# Patient Record
Sex: Female | Born: 1965 | Race: Black or African American | Hispanic: No | Marital: Single | Smoking: Never smoker
Health system: Southern US, Community
[De-identification: ages and names within clinical notes are randomized; demographics above are authoritative.]

## PROBLEM LIST (undated history)

## (undated) DIAGNOSIS — I10 Essential (primary) hypertension: Secondary | ICD-10-CM

## (undated) DIAGNOSIS — R32 Unspecified urinary incontinence: Secondary | ICD-10-CM

## (undated) DIAGNOSIS — R011 Cardiac murmur, unspecified: Secondary | ICD-10-CM

## (undated) DIAGNOSIS — R3915 Urgency of urination: Secondary | ICD-10-CM

## (undated) DIAGNOSIS — M25551 Pain in right hip: Secondary | ICD-10-CM

## (undated) DIAGNOSIS — R51 Headache: Secondary | ICD-10-CM

## (undated) DIAGNOSIS — G8929 Other chronic pain: Secondary | ICD-10-CM

## (undated) DIAGNOSIS — T8859XA Other complications of anesthesia, initial encounter: Secondary | ICD-10-CM

## (undated) DIAGNOSIS — Z8489 Family history of other specified conditions: Secondary | ICD-10-CM

## (undated) DIAGNOSIS — D649 Anemia, unspecified: Secondary | ICD-10-CM

## (undated) DIAGNOSIS — M1990 Unspecified osteoarthritis, unspecified site: Secondary | ICD-10-CM

## (undated) DIAGNOSIS — R0600 Dyspnea, unspecified: Secondary | ICD-10-CM

## (undated) DIAGNOSIS — G5602 Carpal tunnel syndrome, left upper limb: Secondary | ICD-10-CM

## (undated) DIAGNOSIS — T4145XA Adverse effect of unspecified anesthetic, initial encounter: Secondary | ICD-10-CM

## (undated) DIAGNOSIS — M545 Low back pain, unspecified: Secondary | ICD-10-CM

## (undated) DIAGNOSIS — Z8709 Personal history of other diseases of the respiratory system: Secondary | ICD-10-CM

## (undated) DIAGNOSIS — N390 Urinary tract infection, site not specified: Secondary | ICD-10-CM

## (undated) DIAGNOSIS — N949 Unspecified condition associated with female genital organs and menstrual cycle: Secondary | ICD-10-CM

## (undated) HISTORY — PX: BACK SURGERY: SHX140

## (undated) HISTORY — DX: Unspecified condition associated with female genital organs and menstrual cycle: N94.9

---

## 1990-04-12 HISTORY — PX: TUBAL LIGATION: SHX77

## 1993-04-12 HISTORY — PX: REDUCTION MAMMAPLASTY: SUR839

## 1998-07-06 ENCOUNTER — Emergency Department (HOSPITAL_COMMUNITY): Admission: EM | Admit: 1998-07-06 | Discharge: 1998-07-06 | Payer: Self-pay | Admitting: Emergency Medicine

## 1999-07-29 ENCOUNTER — Encounter: Admission: RE | Admit: 1999-07-29 | Discharge: 1999-07-29 | Payer: Self-pay | Admitting: Hematology and Oncology

## 1999-08-10 ENCOUNTER — Encounter: Admission: RE | Admit: 1999-08-10 | Discharge: 1999-08-10 | Payer: Self-pay | Admitting: Internal Medicine

## 1999-08-24 ENCOUNTER — Emergency Department (HOSPITAL_COMMUNITY): Admission: EM | Admit: 1999-08-24 | Discharge: 1999-08-24 | Payer: Self-pay | Admitting: Emergency Medicine

## 1999-11-19 ENCOUNTER — Encounter: Admission: RE | Admit: 1999-11-19 | Discharge: 1999-11-19 | Payer: Self-pay | Admitting: Internal Medicine

## 2001-02-19 ENCOUNTER — Emergency Department (HOSPITAL_COMMUNITY): Admission: EM | Admit: 2001-02-19 | Discharge: 2001-02-19 | Payer: Self-pay

## 2001-08-04 ENCOUNTER — Encounter: Admission: RE | Admit: 2001-08-04 | Discharge: 2001-08-04 | Payer: Self-pay

## 2001-09-27 ENCOUNTER — Encounter: Admission: RE | Admit: 2001-09-27 | Discharge: 2001-09-27 | Payer: Self-pay | Admitting: Internal Medicine

## 2003-10-04 ENCOUNTER — Emergency Department (HOSPITAL_COMMUNITY): Admission: EM | Admit: 2003-10-04 | Discharge: 2003-10-04 | Payer: Self-pay | Admitting: *Deleted

## 2005-11-01 ENCOUNTER — Emergency Department (HOSPITAL_COMMUNITY): Admission: EM | Admit: 2005-11-01 | Discharge: 2005-11-01 | Payer: Self-pay | Admitting: Emergency Medicine

## 2006-08-15 ENCOUNTER — Emergency Department (HOSPITAL_COMMUNITY): Admission: EM | Admit: 2006-08-15 | Discharge: 2006-08-15 | Payer: Self-pay | Admitting: Emergency Medicine

## 2006-09-21 ENCOUNTER — Emergency Department (HOSPITAL_COMMUNITY): Admission: EM | Admit: 2006-09-21 | Discharge: 2006-09-21 | Payer: Self-pay | Admitting: Family Medicine

## 2006-12-20 ENCOUNTER — Emergency Department (HOSPITAL_COMMUNITY): Admission: EM | Admit: 2006-12-20 | Discharge: 2006-12-20 | Payer: Self-pay | Admitting: Family Medicine

## 2007-03-10 ENCOUNTER — Ambulatory Visit (HOSPITAL_COMMUNITY): Admission: RE | Admit: 2007-03-10 | Discharge: 2007-03-10 | Payer: Self-pay | Admitting: Obstetrics & Gynecology

## 2007-03-16 ENCOUNTER — Encounter: Admission: RE | Admit: 2007-03-16 | Discharge: 2007-03-16 | Payer: Self-pay | Admitting: Obstetrics & Gynecology

## 2007-06-01 ENCOUNTER — Emergency Department (HOSPITAL_COMMUNITY): Admission: EM | Admit: 2007-06-01 | Discharge: 2007-06-01 | Payer: Self-pay | Admitting: Emergency Medicine

## 2007-06-02 ENCOUNTER — Emergency Department (HOSPITAL_COMMUNITY): Admission: EM | Admit: 2007-06-02 | Discharge: 2007-06-02 | Payer: Self-pay | Admitting: Emergency Medicine

## 2007-11-14 ENCOUNTER — Encounter: Admission: RE | Admit: 2007-11-14 | Discharge: 2007-11-14 | Payer: Self-pay | Admitting: Obstetrics & Gynecology

## 2007-11-20 ENCOUNTER — Emergency Department (HOSPITAL_COMMUNITY): Admission: EM | Admit: 2007-11-20 | Discharge: 2007-11-20 | Payer: Self-pay | Admitting: Emergency Medicine

## 2008-03-12 ENCOUNTER — Encounter (INDEPENDENT_AMBULATORY_CARE_PROVIDER_SITE_OTHER): Payer: Self-pay | Admitting: Internal Medicine

## 2008-03-12 ENCOUNTER — Encounter: Admission: RE | Admit: 2008-03-12 | Discharge: 2008-03-12 | Payer: Self-pay | Admitting: Obstetrics & Gynecology

## 2008-07-11 ENCOUNTER — Emergency Department (HOSPITAL_COMMUNITY): Admission: EM | Admit: 2008-07-11 | Discharge: 2008-07-11 | Payer: Self-pay | Admitting: Emergency Medicine

## 2008-09-09 ENCOUNTER — Encounter (INDEPENDENT_AMBULATORY_CARE_PROVIDER_SITE_OTHER): Payer: Self-pay | Admitting: Internal Medicine

## 2008-09-12 ENCOUNTER — Encounter (INDEPENDENT_AMBULATORY_CARE_PROVIDER_SITE_OTHER): Payer: Self-pay | Admitting: Internal Medicine

## 2008-09-12 ENCOUNTER — Ambulatory Visit: Payer: Self-pay | Admitting: Internal Medicine

## 2008-09-12 DIAGNOSIS — M150 Primary generalized (osteo)arthritis: Secondary | ICD-10-CM | POA: Insufficient documentation

## 2008-09-12 DIAGNOSIS — J45909 Unspecified asthma, uncomplicated: Secondary | ICD-10-CM

## 2008-09-13 LAB — CONVERTED CEMR LAB
HDL: 48 mg/dL (ref >39)
LDL Cholesterol: 117 mg/dL — ABNORMAL HIGH (ref 0–99)
Triglycerides: 69 mg/dL (ref <150)
VLDL: 14 mg/dL (ref 0–40)

## 2008-10-21 ENCOUNTER — Ambulatory Visit: Payer: Self-pay | Admitting: Internal Medicine

## 2008-10-21 LAB — CONVERTED CEMR LAB
Ketones, urine, test strip: NEGATIVE
Nitrite: NEGATIVE
Protein, U semiquant: 30
pH: 5

## 2008-11-02 ENCOUNTER — Emergency Department (HOSPITAL_COMMUNITY): Admission: EM | Admit: 2008-11-02 | Discharge: 2008-11-02 | Payer: Self-pay | Admitting: Emergency Medicine

## 2008-11-04 ENCOUNTER — Ambulatory Visit: Payer: Self-pay | Admitting: Internal Medicine

## 2009-01-08 ENCOUNTER — Ambulatory Visit: Payer: Self-pay | Admitting: Obstetrics and Gynecology

## 2009-01-08 ENCOUNTER — Encounter: Payer: Self-pay | Admitting: Obstetrics and Gynecology

## 2009-01-08 LAB — CONVERTED CEMR LAB: Hepatitis B Surface Ag: NEGATIVE

## 2009-01-08 LAB — HM PAP SMEAR: HM Pap smear: NEGATIVE

## 2009-01-16 ENCOUNTER — Ambulatory Visit: Payer: Self-pay | Admitting: Internal Medicine

## 2009-01-17 LAB — CONVERTED CEMR LAB
ALT: 8 units/L (ref 0–35)
AST: 12 units/L (ref 0–37)
Albumin: 4 g/dL (ref 3.5–5.2)
Calcium: 9.2 mg/dL (ref 8.4–10.5)
Chloride: 106 meq/L (ref 96–112)
Eosinophils Relative: 2 % (ref 0–5)
HCT: 39.4 % (ref 36.0–46.0)
Lymphocytes Relative: 45 % (ref 12–46)
Lymphs Abs: 3 10*3/uL (ref 0.7–4.0)
MCHC: 30.5 g/dL (ref 30.0–36.0)
MCV: 85.1 fL (ref >=78.0)
Neutro Abs: 2.7 10*3/uL (ref 1.7–7.7)
Neutrophils Relative %: 40 % — ABNORMAL LOW (ref 43–77)
Potassium: 4.1 meq/L (ref 3.5–5.3)
RBC: 4.63 M/uL (ref 3.87–5.11)
Sodium: 141 meq/L (ref 135–145)

## 2009-01-29 ENCOUNTER — Telehealth (INDEPENDENT_AMBULATORY_CARE_PROVIDER_SITE_OTHER): Payer: Self-pay | Admitting: *Deleted

## 2009-01-30 ENCOUNTER — Telehealth: Payer: Self-pay | Admitting: *Deleted

## 2009-03-05 ENCOUNTER — Ambulatory Visit: Payer: Self-pay | Admitting: Internal Medicine

## 2009-03-05 DIAGNOSIS — M25519 Pain in unspecified shoulder: Secondary | ICD-10-CM

## 2009-03-05 LAB — CONVERTED CEMR LAB: LDL Cholesterol: 97 mg/dL (ref 0–99)

## 2009-03-10 ENCOUNTER — Ambulatory Visit: Payer: Self-pay | Admitting: Family Medicine

## 2009-03-13 ENCOUNTER — Ambulatory Visit (HOSPITAL_COMMUNITY): Admission: RE | Admit: 2009-03-13 | Discharge: 2009-03-13 | Payer: Self-pay | Admitting: Internal Medicine

## 2009-04-12 DIAGNOSIS — N949 Unspecified condition associated with female genital organs and menstrual cycle: Secondary | ICD-10-CM

## 2009-04-12 HISTORY — DX: Unspecified condition associated with female genital organs and menstrual cycle: N94.9

## 2009-04-12 HISTORY — PX: OVARIAN CYST REMOVAL: SHX89

## 2009-05-11 ENCOUNTER — Emergency Department (HOSPITAL_COMMUNITY): Admission: EM | Admit: 2009-05-11 | Discharge: 2009-05-12 | Payer: Self-pay | Admitting: Emergency Medicine

## 2009-05-15 ENCOUNTER — Emergency Department (HOSPITAL_COMMUNITY): Admission: EM | Admit: 2009-05-15 | Discharge: 2009-05-15 | Payer: Self-pay | Admitting: Emergency Medicine

## 2009-05-15 ENCOUNTER — Emergency Department (HOSPITAL_COMMUNITY): Admission: EM | Admit: 2009-05-15 | Discharge: 2009-05-15 | Payer: Self-pay | Admitting: Family Medicine

## 2009-06-04 ENCOUNTER — Ambulatory Visit: Payer: Self-pay | Admitting: Obstetrics and Gynecology

## 2009-06-12 ENCOUNTER — Telehealth: Payer: Self-pay | Admitting: Internal Medicine

## 2009-06-24 ENCOUNTER — Ambulatory Visit: Payer: Self-pay | Admitting: Internal Medicine

## 2009-06-24 LAB — CONVERTED CEMR LAB: Chlamydia, DNA Probe: NEGATIVE

## 2009-07-07 ENCOUNTER — Encounter: Payer: Self-pay | Admitting: Obstetrics & Gynecology

## 2009-07-07 ENCOUNTER — Inpatient Hospital Stay (HOSPITAL_COMMUNITY): Admission: RE | Admit: 2009-07-07 | Discharge: 2009-07-09 | Payer: Self-pay | Admitting: Obstetrics & Gynecology

## 2009-07-07 ENCOUNTER — Ambulatory Visit: Payer: Self-pay | Admitting: Obstetrics & Gynecology

## 2009-09-04 ENCOUNTER — Ambulatory Visit: Payer: Self-pay | Admitting: Obstetrics & Gynecology

## 2009-09-24 ENCOUNTER — Ambulatory Visit: Payer: Self-pay | Admitting: Internal Medicine

## 2009-10-14 ENCOUNTER — Ambulatory Visit: Payer: Self-pay | Admitting: Internal Medicine

## 2009-10-16 LAB — CONVERTED CEMR LAB
HCT: 37.1 % (ref 36.0–46.0)
MCHC: 31.5 g/dL (ref 30.0–36.0)
Platelets: 357 10*3/uL (ref 150–400)
RDW: 16.2 % — ABNORMAL HIGH (ref 11.5–15.5)

## 2009-10-17 ENCOUNTER — Ambulatory Visit: Payer: Self-pay | Admitting: Internal Medicine

## 2009-10-18 LAB — CONVERTED CEMR LAB: Free T4: 1 ng/dL (ref 0.80–1.80)

## 2009-10-29 ENCOUNTER — Telehealth: Payer: Self-pay | Admitting: Internal Medicine

## 2009-10-30 ENCOUNTER — Telehealth: Payer: Self-pay | Admitting: Internal Medicine

## 2009-11-05 ENCOUNTER — Ambulatory Visit: Payer: Self-pay | Admitting: Obstetrics and Gynecology

## 2009-11-06 ENCOUNTER — Encounter: Payer: Self-pay | Admitting: Internal Medicine

## 2010-02-19 ENCOUNTER — Ambulatory Visit: Payer: Self-pay | Admitting: Internal Medicine

## 2010-02-19 LAB — CONVERTED CEMR LAB
Bilirubin Urine: NEGATIVE
Hemoglobin, Urine: NEGATIVE
Ketones, ur: NEGATIVE mg/dL
Leukocytes, UA: NEGATIVE
Protein, ur: NEGATIVE mg/dL
Specific Gravity, Urine: >=1.03
Urine Glucose: NEGATIVE mg/dL
WBC Urine, dipstick: NEGATIVE
pH: 5

## 2010-05-12 NOTE — Progress Notes (Signed)
Summary: refill/ hla  Phone Note Refill Request Message from:  Patient on June 12, 2009 5:39 PM  Refills Requested: Medication #1:  CYCLOBENZAPRINE HCL 10 MG TABS Take one tab twice daily as needed for muscle pain.   Last Refilled: 12/10  Medication #2:  ETODOLAC 400 MG TABS Take 1 tablet by mouth twice a day..   Last Refilled: 12/10 last visit 11/10  Initial call taken by: Marin Roberts RN,  June 12, 2009 5:39 PM  Follow-up for Phone Call        Refill approve    Prescriptions: ETODOLAC 400 MG TABS (ETODOLAC) Take 1 tablet by mouth twice a day.  #60 x 0   Entered and Authorized by:   Bethel Born MD   Signed by:   Bethel Born MD on 06/25/2009   Method used:   Electronically to        [facility] (retail)       [address]       [place], [place]  [address]       Ph: [phone]       Fax: [phone]   RxID:   [id number] CYCLOBENZAPRINE HCL 10 MG TABS (CYCLOBENZAPRINE HCL) Take one tab twice daily as needed for muscle pain.  #60 x 0   Entered and Authorized by:   Bethel Born MD   Signed by:   Bethel Born MD on 06/25/2009   Method used:   Electronically to        [facility] (retail)       [address]       [place], [place]  [address]       Ph: [phone]       Fax: [phone]   RxID:   [id number]

## 2010-05-12 NOTE — Assessment & Plan Note (Signed)
Summary: pelvic/gg   Vital Signs:  Patient profile:   45 year old female Height:      62 inches Weight:      234.0 pounds BMI:     42.95 Temp:     98.0 degrees F oral Pulse rate:   87 / minute BP sitting:   140 / 90  (right arm)  Vitals Entered By: Filomena Jungling NT II (June 24, 2009 10:53 AM) CC: PATIENT EXPOSED  CARD  FROM HEALTH DEPARTMENT Is Patient Diabetic? No Pain Assessment Patient in pain? no      Nutritional Status BMI of > 30 = obese  Have you ever been in a relationship where you felt threatened, hurt or afraid?No   Does patient need assistance? Functional Status Self care Ambulation Normal   Primary Care Provider:  Bethel Born MD  CC:  PATIENT EXPOSED  CARD  FROM HEALTH DEPARTMENT.  History of Present Illness: Brandi Chase is a 45 yo F who presents for testing after one of her partners reported that he was recently diagnosed and treated for chlamydia. She has two partners currently and does not use protection (she had her tubes tied previously). She denies any abnormal discharge, itching, or burning sensation. She would also like to be checked for other STDs, including syphilis and HIV. She is scheduled to have an ovarian cyst removed next week.  Preventive Screening-Counseling & Management  Caffeine-Diet-Exercise     Does Patient Exercise: no  Current Medications (verified): 1)  Cyclobenzaprine Hcl 10 Mg Tabs (Cyclobenzaprine Hcl) .... Take One Tab Twice Daily As Needed For Muscle Pain. 2)  Ventolin Hfa 108 (90 Base) Mcg/act Aers (Albuterol Sulfate) .... Take 2 Puffs Every 4 Hours As Needed For Shorness of Breath. 3)  Etodolac 400 Mg Tabs (Etodolac) .... Take 1 Tablet By Mouth Twice A Day.  Allergies (verified): 1)  ! Primatene Mist (Epinephrine Base)  Past History:  Past Medical History: Last updated: 09/12/2008 Asthma Low back pain 2/2 DDD  Past Surgical History: Last updated: 09/12/2008 Breast reduction 1994 Tuboligation 1992  Family  History: Last updated: 09/12/2008 Mom - RA Dad - Asthma, HTN 8 siblings - healthy  Social History: Last updated: 09/12/2008 Currently lives at home with son and daughter. 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 No apparent alopecia or balding. Genitalia:  No external lesions. No abnormal discharge or erythema of the vaginal wall.  Neurologic:  alert & oriented X3.   Psych:  Cognition and judgment appear intact. Alert and cooperative with normal attention span and concentration. No apparent delusions, illusions, hallucinations   Impression & Recommendations:  Problem # 1:  SEXUALLY TRANSMITTED DISEASE, EXPOSURE TO (ICD-V01.6) Pelvic exam was normal. Sent for GC and Chlamydia and will test for syphilis and HIV as well with pt's permission. I will call the patient tomorrow with the results of her tests.  Orders: T- GC Chlamydia ([address]) T-Syphilis Test ([facility]) [phone]) T-HIV Antibody  ([facility]) ([id number])  Complete Medication List: 1)  Cyclobenzaprine Hcl 10 Mg Tabs (Cyclobenzaprine hcl) .... Take one tab twice daily as needed for muscle pain. 2)  Ventolin Hfa 108 (90 Base) Mcg/act Aers (Albuterol sulfate) .... Take 2 puffs every 4  hours as needed for shorness of breath. 3)  Etodolac 400 Mg Tabs (Etodolac) .... Take 1 tablet by mouth twice a day.  Patient Instructions: 1)  Please schedule an appointment as needed. 2)  I will call you tomorrow with the results of your tests. Process Orders Check Orders Results:     [facility]: ABN not required for this insurance Tests Sent for requisitioning  (June 24, 2009 11:35 AM):     06/24/2009: [facility] -- T- GC Chlamydia [[id number]] (signed)     06/24/2009: [facility] -- T-Syphilis Test ([facility]) [phone] (signed)     06/24/2009: [facility] -- T-HIV Antibody  ([facility]) [[id number]] (signed)

## 2010-05-12 NOTE — Assessment & Plan Note (Signed)
Summary: DERMATOLOGIST REF FOR ACNE ON FACE AND BACK/CFB   Vital Signs:  Patient profile:   45 year old female Height:      62 inches (157.48 cm) Weight:      233.9 pounds (106.32 kg) BMI:     42.94 Temp:     97.4 degrees F oral Pulse rate:   84 / minute BP sitting:   18 / 79  (right arm)  Vitals Entered By: Chinita Pester RN (September 24, 2009 2:59 PM) CC: Acne on back - Derma. referral. Aching joints. Is Patient Diabetic? No Pain Assessment Patient in pain? yes     Location: shoulders Intensity: 8 Type: aching Onset of pain  Constant Nutritional Status BMI of > 30 = obese  Have you ever been in a relationship where you felt threatened, hurt or afraid?No   Does patient need assistance? Functional Status Self care Ambulation Normal   Primary Care Provider:  Bethel Born MD  CC:  Acne on back - Derma. referral. Aching joints..  History of Present Illness:  45 y/o woman with PMH of asthma, ,last episode last seen in 3/11 for test for STD after exposure to partner with chlamydia comes for a routien follow up visit. She had a right salpingectomy performed on 3/11 for pelvic pain without any pre-op or post-op complications.  Patient complaints of acne today. She says she had it for a while now and has tried retinoic acid and a small pill which I assume was minocycline. REtinoic acid heled but cause her excessive skin irritation.   Depression History:      The patient denies a depressed mood most of the day and a diminished interest in her usual daily activities.         Preventive Screening-Counseling & Management  Alcohol-Tobacco     Alcohol drinks/day: 0     Smoking Status: never  Caffeine-Diet-Exercise     Does Patient Exercise: no  Current Medications (verified): 1)  Cyclobenzaprine Hcl 10 Mg Tabs (Cyclobenzaprine Hcl) .... Take One Tab Twice Daily As Needed For Muscle Pain. 2)  Ventolin Hfa 108 (90 Base) Mcg/act Aers (Albuterol Sulfate) .... Take 2 Puffs Every 4  Hours As Needed For Shorness of Breath. 3)  Etodolac 400 Mg Tabs (Etodolac) .... Take 1 Tablet By Mouth Twice A Day. 4)  Benzoyl Peroxide 5 % Gel (Benzoyl Peroxide) .... Two Times A Day On The Acne With The Moisturizing Cream 5)  Ammonium Lactate 12 % Lotn (Ammonium Lactate) .... Apply With The Acne Cream Twice Daily  Allergies (verified): 1)  ! Primatene Mist (Epinephrine Base)  Review of Systems  The patient denies anorexia, fever, weight loss, weight gain, vision loss, decreased hearing, hoarseness, chest pain, syncope, dyspnea on exertion, peripheral edema, prolonged cough, headaches, hemoptysis, abdominal pain, melena, hematochezia, severe indigestion/heartburn, hematuria, incontinence, genital sores, muscle weakness, suspicious skin lesions, transient blindness, difficulty walking, depression, unusual weight change, abnormal bleeding, enlarged lymph nodes, angioedema, breast masses, and testicular masses.    Physical Exam  General:  Well-developed,well-nourished,in no acute distress; alert,appropriate and cooperative throughout examination Head:  Normocephalic and atraumatic without obvious abnormalities. No apparent alopecia or balding. Nose:  some mild acne n nose and cheeks and back Mouth:  Bilateral moderate to severe tonsillar hypertrophy almost blocking the view of posterior pharyngeal wall. no sign of erythema or pus noted. Lungs:  normal respiratory effort, no intercostal retractions, no accessory muscle use, and normal breath sounds.   Heart:  normal rate and regular rhythm.  Abdomen:  soft, non-tender, normal bowel sounds, and no distention.     Impression & Recommendations:  Problem # 1:  ACNE VULGARIS (ICD-706.1) retinoic acid causes intolerence. Will try benzoyl peroxide with moisturizing cream. Will try topical ABx if this regimen does not work. Will consider DErmatology referral at next visit as well  Her updated medication list for this problem includes:    Benzoyl  Peroxide 5 % Gel (Benzoyl peroxide) .Marland Kitchen..Marland Kitchen Two times a day on the acne with the moisturizing cream  Problem # 2:  LOW BACK PAIN, CHRONIC (ICD-724.2) PT. PAin meds helping  Her updated medication list for this problem includes:    Cyclobenzaprine Hcl 10 Mg Tabs (Cyclobenzaprine hcl) .Marland Kitchen... Take one tab twice daily as needed for muscle pain.    Etodolac 400 Mg Tabs (Etodolac) .Marland Kitchen... Take 1 tablet by mouth twice a day.  Problem # 3:  ASTHMA (ICD-493.90) Stable   Her updated medication list for this problem includes:    Ventolin Hfa 108 (90 Base) Mcg/act Aers (Albuterol sulfate) .Marland Kitchen... Take 2 puffs every 4 hours as needed for shorness of breath.  Problem # 4:  Preventive Health Care (ICD-V70.0) PAtient had lap salpingectomy and ovarian cyst removal. Her Hb was 10 post op. Will like to recheck CBC but she is refusing today for time constraints. Will consider at next visit  Complete Medication List: 1)  Cyclobenzaprine Hcl 10 Mg Tabs (Cyclobenzaprine hcl) .... Take one tab twice daily as needed for muscle pain. 2)  Ventolin Hfa 108 (90 Base) Mcg/act Aers (Albuterol sulfate) .... Take 2 puffs every 4 hours as needed for shorness of breath. 3)  Etodolac 400 Mg Tabs (Etodolac) .... Take 1 tablet by mouth twice a day. 4)  Benzoyl Peroxide 5 % Gel (Benzoyl peroxide) .... Two times a day on the acne with the moisturizing cream 5)  Ammonium Lactate 12 % Lotn (Ammonium lactate) .... Apply with the acne cream twice daily  Other Orders: Physical Therapy Referral (PT)  Patient Instructions: 1)  Please schedule a follow-up appointment in 2 months. Prescriptions: BENZOYL PEROXIDE 5 % GEL (BENZOYL PEROXIDE) two times a day on the acne with the moisturizing cream  #1 x 3   Entered and Authorized by:   Bethel Born MD   Signed by:   Bethel Born MD on 09/24/2009   Method used:   Electronically to        [facility] (retail)       [address]       [place], [place]   [address]       Ph: [phone]       Fax: [phone]   RxID:   [id number] AMMONIUM LACTATE 12 % LOTN (AMMONIUM LACTATE) apply with the acne cream twice daily  #1 x 3   Entered and Authorized by:   Bethel Born MD   Signed by:   Bethel Born MD on 09/24/2009   Method used:   Electronically to        [facility] (retail)       [address]       [place], [place]  [address]       Ph: [phone]       Fax: [phone]   RxID:   [id number]   Prevention & Chronic Care Immunizations   Influenza vaccine: Not documented   Influenza vaccine deferral: Refused  (  01/16/2009)   Influenza vaccine due: 12/12/2010    Tetanus booster: 10/21/2008: Td   Tetanus booster due: 10/22/2018    Pneumococcal vaccine: Not documented   Pneumococcal vaccine deferral: Refused  (09/24/2009)  Other Screening   Pap smear: NEGATIVE FOR INTRAEPITHELIAL LESIONS OR MALIGNANCY.  (01/08/2009)   Pap smear action/deferral: Ordered  (10/21/2008)   Pap smear due: 01/08/2010    Mammogram: ASSESSMENT: Negative - BI-RADS 1^MM DIGITAL SCREENING  (03/13/2009)   Mammogram action/deferral: Refused  (09/24/2009)   Mammogram due: 03/13/2010  Reports requested:   Last Pap report requested.  Smoking status: never  (09/24/2009)  Lipids   Total Cholesterol: 162  (03/05/2009)   Lipid panel action/deferral: Refused   LDL: 97  (03/05/2009)   LDL Direct: Not documented   HDL: 49  (03/05/2009)   Triglycerides: 79  (03/05/2009)   Lipid panel due: 04/23/2009      Resource handout printed.   Nursing Instructions: Request report of last Pap

## 2010-05-12 NOTE — Progress Notes (Signed)
Summary: Results  Phone Note Call from Patient   Caller: Patient Call For: Bethel Born MD Summary of Call: Call from pt given results of labs.  Pt said that she is always tired.  Wants to know what she can get for this.  Angelina Ok RN  October 29, 2009 3:13 PM  Initial call taken by: Angelina Ok RN,  October 29, 2009 3:13 PM  Follow-up for Phone Call        We might try looking into B12 deficinecy, diabetes, depression and immunodeficiency based on history at next appointement. It would be impossbile to give a pill for weakness. Until then advise, increased fluid intake amd multivitamin pills over the counter.   Thanks Follow-up by: Bethel Born MD,  October 30, 2009 8:24 AM

## 2010-05-12 NOTE — Progress Notes (Signed)
Summary: Multivitamins  Phone Note Outgoing Call   Call placed by: Angelina Ok RN,  October 30, 2009 10:37 AM Call placed to: Patient Summary of Call: RTC to pt informed her that Dr. Scot Dock suggested that she increase her fluid intake and to take OTC Mutivitamins to see if this will incresae her energy level.  Plans to do a further work up  at her next visit if this does not improve.  Pt voiced an understanding of the plan. Angelina Ok RN  October 30, 2009 10:39 AM  Initial call taken by: Angelina Ok RN,  October 30, 2009 10:39 AM

## 2010-05-12 NOTE — Assessment & Plan Note (Signed)
Summary: STOMACH/SB.   Vital Signs:  Patient profile:   45 year old female Height:      62 inches (157.48 cm) Weight:      232.0 pounds (105.45 kg) BMI:     42.59 Temp:     97.1 degrees F (36.17 degrees C) oral Pulse rate:   80 / minute BP sitting:   131 / 82  (left arm) Cuff size:   large  Vitals Entered By: Cynda Familia Duncan Dull) (February 19, 2010 3:37 PM) CC: lower abd pain. ?UTI Is Patient Diabetic? No Pain Assessment Patient in pain? no      Nutritional Status BMI of > 30 = obese  Have you ever been in a relationship where you felt threatened, hurt or afraid?No   Does patient need assistance? Functional Status Self care Ambulation Normal   Primary Care Provider:  Bethel Born MD  CC:  lower abd pain. ?UTI.  History of Present Illness: Pt is a 45 y/o AAF with PMH of UTI, acne and asthma comes to the clinic for suprapubic pain for 5 days. It is dull, no radiation, 5/10, no N/V, fever, flank pain or urination abnormality such as dysuria or urgency. These symptoms are similar to her previous UTI. She has no ther c/o. Her acne is much better. No smoking, ETOH or drug abuse.   Preventive Screening-Counseling & Management  Alcohol-Tobacco     Alcohol drinks/day: 0     Smoking Status: never  Problems Prior to Update: 1)  Thyroid Stimulating Hormone, Abnormal  (ICD-246.9) 2)  Acne Vulgaris  (ICD-706.1) 3)  Shoulder Pain, Left  (ICD-719.41) 4)  Health Screening  (ICD-V70.0) 5)  Low Back Pain, Chronic  (ICD-724.2) 6)  Asthma  (ICD-493.90)  Medications Prior to Update: 1)  Cyclobenzaprine Hcl 10 Mg Tabs (Cyclobenzaprine Hcl) .... Take One Tab Twice Daily As Needed For Muscle Pain. 2)  Ventolin Hfa 108 (90 Base) Mcg/act Aers (Albuterol Sulfate) .... Take 2 Puffs Every 4 Hours As Needed For Shorness of Breath. 3)  Etodolac 400 Mg Tabs (Etodolac) .... Take 1 Tablet By Mouth Twice A Day. 4)  Benzoyl Peroxide 5 % Gel (Benzoyl Peroxide) .... Two Times A Day On The Acne  With The Moisturizing Cream 5)  Ammonium Lactate 12 % Lotn (Ammonium Lactate) .... Apply With The Acne Cream Twice Daily  Current Medications (verified): 1)  Cyclobenzaprine Hcl 10 Mg Tabs (Cyclobenzaprine Hcl) .... Take One Tab Twice Daily As Needed For Muscle Pain. 2)  Ventolin Hfa 108 (90 Base) Mcg/act Aers (Albuterol Sulfate) .... Take 2 Puffs Every 4 Hours As Needed For Shorness of Breath. 3)  Etodolac 400 Mg Tabs (Etodolac) .... Take 1 Tablet By Mouth Twice A Day. 4)  Benzoyl Peroxide 5 % Gel (Benzoyl Peroxide) .... Two Times A Day On The Acne With The Moisturizing Cream 5)  Ammonium Lactate 12 % Lotn (Ammonium Lactate) .... Apply With The Acne Cream Twice Daily 6)  Ciprofloxacin Hcl 500 Mg Tabs (Ciprofloxacin Hcl) .... Take 1 Tablet By Mouth Two Times A Day For 3 Days  Allergies: 1)  ! 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 Mom - RA Dad - Asthma, HTN 8 siblings - healthy  Social History: Reviewed history from 09/12/2008 and no changes required. 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  The patient denies anorexia, fever, chest pain, syncope, dyspnea on exertion, peripheral edema, hemoptysis, melena, hematochezia, hematuria, and incontinence.    Physical Exam  General:  alert, well-developed, well-nourished,  well-hydrated, and overweight-appearing.   Head:  normocephalic.   Nose:  no nasal discharge.   Mouth:  pharynx pink and moist.   Neck:  supple.   Lungs:  normal respiratory effort, normal breath sounds, no crackles, and no wheezes.   Heart:  normal rate, regular rhythm, no murmur, and no JVD.   Abdomen:  soft, normal bowel sounds, no distention, and no masses.  Suprapubic mild tenderness. No CVAT. Msk:  normal ROM, no joint tenderness, no joint swelling, no joint warmth, and no redness over joints.   Pulses:  2+ Extremities:  No edema.  Neurologic:  alert & oriented X3, cranial nerves II-XII intact, strength normal in all extremities, sensation intact to light touch, and gait normal.     Impression & Recommendations:  Problem # 1:  UTI (ICD-599.0) Assessment New Her symptom may be due to UTI. But other possible cause include colitis, urolithiasis or GYN causes. Will check UA and UCx and give her cipro for 3 days to see if any improvement. If not, may need abdominal US or X-Ray.  Orders: T-Culture, Urine ([id number]) T-Urinalysis ([id number]) T-Urinalysis Dipstick only ([id number])  Her updated medication list for this problem includes:    Ciprofloxacin Hcl 500 Mg Tabs (Ciprofloxacin hcl) .Marland Kitchen... Take 1 tablet by mouth two times a day for 3 days  Problem # 2:  ACNE VULGARIS (ICD-706.1) Assessment: Improved Her acne is much better and will continue current meds. Her updated medication list for this problem includes:    Benzoyl Peroxide 5 % Gel (Benzoyl peroxide) .Marland Kitchen..Marland Kitchen Two times a day on the acne with the moisturizing cream    Ciprofloxacin Hcl 500 Mg Tabs (Ciprofloxacin hcl) .Marland Kitchen... Take 1 tablet by mouth two times a day for 3 days  Problem # 3:  LOW BACK PAIN, CHRONIC (ICD-724.2) Assessment: Unchanged Stable and continue current home meds. Her updated medication list for this problem includes:    Cyclobenzaprine Hcl 10 Mg Tabs (Cyclobenzaprine hcl) .Marland Kitchen... Take one tab twice daily as  needed for muscle pain.    Etodolac 400 Mg Tabs (Etodolac) .Marland Kitchen... Take 1 tablet by mouth twice a day.  Complete Medication List: 1)  Cyclobenzaprine Hcl 10 Mg Tabs (Cyclobenzaprine hcl) .... Take one tab twice daily as needed for muscle pain. 2)  Ventolin Hfa 108 (90 Base) Mcg/act Aers (Albuterol sulfate) .... Take 2 puffs every 4 hours as needed for shorness of breath. 3)  Etodolac 400 Mg Tabs (Etodolac) .... Take 1 tablet by mouth twice a day. 4)  Benzoyl Peroxide 5 % Gel (Benzoyl peroxide) .... Two times a day on the acne with the moisturizing cream 5)  Ammonium Lactate 12 % Lotn (Ammonium lactate) .... Apply with the acne cream twice daily 6)  Ciprofloxacin Hcl 500 Mg Tabs (Ciprofloxacin hcl) .... Take 1 tablet by mouth two times a day for 3 days  Patient Instructions:  1)  Please schedule a follow-up appointment in 3-4 months. 2)  We will call you if any abnormal labs. 3)  Please drink more fluids and return if your symptoms has no improvement. 4)  It is important that you exercise regularly at least 20 minutes 5 times a week. If you develop chest pain, have severe difficulty breathing, or feel very tired , stop exercising immediately and seek medical attention. 5)  You need to lose weight. Consider a lower calorie diet and regular exercise.  Prescriptions: ETODOLAC 400 MG TABS (ETODOLAC) Take 1 tablet by mouth twice a day.  #60 x 0   Entered and Authorized by:   Jackson Latino MD   Signed by:   Jackson Latino MD on 02/19/2010   Method used:   Electronically to        [facility] (retail)       [address]       [place], [place]  [address]       Ph: [phone]       Fax: [phone]   RxID:   [id number] CIPROFLOXACIN HCL 500 MG TABS (CIPROFLOXACIN HCL) Take 1 tablet by mouth two times a day for 3 days  #6 x 0   Entered and Authorized by:   Jackson Latino MD   Signed by:   Jackson Latino MD on 02/19/2010   Method used:   Electronically to          [facility] (retail)       [address]       [place], [place]  [address]       Ph: [phone]       Fax: [phone]   RxID:   [id number]    Orders Added: 1)  T-Culture, Urine [[id number]] 2)  T-Urinalysis [[id number]] 3)  Est. Patient Level III [32355] 4)  T-Urinalysis Dipstick only [[id number]]   Process Orders Check Orders Results:     [facility]: ABN not required for this insurance Tests Sent for requisitioning (February 19, 2010 9:14 PM):     02/19/2010: [facility] -- T-Culture, Urine [[id number]] (signed)     02/19/2010: [facility] -- T-Urinalysis [[id number]] (signed)     Prevention & Chronic Care Immunizations   Influenza vaccine: Not documented   Influenza vaccine deferral: Refused  (01/16/2009)   Influenza vaccine due: 12/12/2010    Tetanus booster: 10/21/2008: Td   Tetanus booster due: 10/22/2018    Pneumococcal vaccine: Not documented   Pneumococcal vaccine deferral: Refused  (09/24/2009)  Other Screening   Pap smear: NEGATIVE FOR INTRAEPITHELIAL LESIONS OR MALIGNANCY.  (01/08/2009)   Pap smear action/deferral: Ordered  (10/21/2008)   Pap smear due: 01/08/2010    Mammogram: ASSESSMENT: Negative - BI-RADS 1^MM DIGITAL SCREENING  (03/13/2009)   Mammogram action/deferral: Refused  (09/24/2009)   Mammogram due: 03/13/2010   Smoking status: never  (02/19/2010)  Lipids   Total Cholesterol: 162  (03/05/2009)   Lipid panel action/deferral: Refused   LDL: 97  (03/05/2009)   LDL Direct: Not documented   HDL: 49  (03/05/2009)   Triglycerides: 79  (03/05/2009)   Lipid panel due: 04/23/2009   Laboratory Results   Urine Tests  Date/Time Received: .Cynda Familia [facility])  February 19, 2010 4:25 PM  Date/Time Reported: Cynda Familia [facility])  February 19, 2010 4:25 PM   Routine Urinalysis   Color: lt. yellow  Glucose: negative   (Normal Range:  Negative) Bilirubin: negative   (Normal Range: Negative) Ketone: negative   (Normal Range: Negative) Spec. Gravity: >=1.030   (Normal Range: 1.003-1.035) Blood: trace-lysed   (Normal Range: Negative) pH: 5.0   (Normal Range: 5.0-8.0) Protein: negative   (Normal Range: Negative) Urobilinogen: 0.2   (Normal Range: 0-1) Nitrite: negative   (Normal Range: Negative) Leukocyte Esterace: negative   (Normal Range: Negative)

## 2010-05-12 NOTE — Assessment & Plan Note (Signed)
Summary: CHECKUP/SB.   Vital Signs:  Patient profile:   45 year old female Height:      62 inches (157.48 cm) Weight:      234.03 pounds (106.38 kg) BMI:     42.96 Temp:     97.4 degrees F (36.33 degrees C) oral Pulse rate:   87 / minute BP sitting:   123 / 82  (right arm)  Vitals Entered By: Angelina Ok RN (October 14, 2009 3:39 PM) Is Patient Diabetic? No Pain Assessment Patient in pain? no      Nutritional Status BMI of > 30 = obese  Have you ever been in a relationship where you felt threatened, hurt or afraid?No   Does patient need assistance? Functional Status Self care Ambulation Normal Comments Check up hemoglobin.   Primary Care Provider:  Bethel Born MD   History of Present Illness: 45 y/o woman with PMH of acne, asthma comes to the clinic for a Hb check. I saw her 3 weeks ago for acne flare up.  No complaints The acne is a little better, although not all the way resolved compaint with meds   Depression History:      The patient denies a depressed mood most of the day and a diminished interest in her usual daily activities.         Preventive Screening-Counseling & Management  Alcohol-Tobacco     Alcohol drinks/day: 0     Smoking Status: never  Current Medications (verified): 1)  Cyclobenzaprine Hcl 10 Mg Tabs (Cyclobenzaprine Hcl) .... Take One Tab Twice Daily As Needed For Muscle Pain. 2)  Ventolin Hfa 108 (90 Base) Mcg/act Aers (Albuterol Sulfate) .... Take 2 Puffs Every 4 Hours As Needed For Shorness of Breath. 3)  Etodolac 400 Mg Tabs (Etodolac) .... Take 1 Tablet By Mouth Twice A Day. 4)  Benzoyl Peroxide 5 % Gel (Benzoyl Peroxide) .... Two Times A Day On The Acne With The Moisturizing Cream 5)  Ammonium Lactate 12 % Lotn (Ammonium Lactate) .... Apply With The Acne Cream Twice Daily  Allergies (verified): 1)  ! Primatene Mist (Epinephrine Base)  Review of Systems  The patient denies anorexia, fever, weight loss, weight gain, vision loss,  decreased hearing, hoarseness, chest pain, syncope, dyspnea on exertion, peripheral edema, prolonged cough, headaches, hemoptysis, abdominal pain, melena, hematochezia, severe indigestion/heartburn, hematuria, incontinence, genital sores, muscle weakness, suspicious skin lesions, transient blindness, difficulty walking, depression, unusual weight change, abnormal bleeding, enlarged lymph nodes, angioedema, breast masses, and testicular masses.    Physical Exam  General:  Well-developed,well-nourished,in no acute distress; alert,appropriate and cooperative throughout examination Head:  Normocephalic and atraumatic without obvious abnormalities. No apparent alopecia or balding. Ears:  External ear exam shows no significant lesions or deformities.  Nose:  some mild acne on rt cheek Neck:  No deformities, masses, or tenderness noted. Lungs:  normal respiratory effort, no intercostal retractions, no accessory muscle use, and normal breath sounds.   Heart:  normal rate and regular rhythm.   Abdomen:  soft, non-tender, normal bowel sounds, and no distention.   Extremities:  No clubbing, cyanosis, edema, or deformity noted with normal full range of motion of all joints.   Neurologic:  alert & oriented X3.     Impression & Recommendations:  Problem # 1:  ACNE VULGARIS (ICD-706.1) getting better but not reolved. Conitnue current meds. Abx cream and dermat referral if no improvement. will ask pt to call us if she feels she needs different treatment.  will check  TSH Her updated medication list for this problem includes:    Benzoyl Peroxide 5 % Gel (Benzoyl peroxide) .Marland Kitchen..Marland Kitchen Two times a day on the acne with the moisturizing cream  Orders: T-CBC No Diff ([id number]) T-TSH ([id number])  Problem # 2:  Preventive Health Care (ICD-V70.0) refuses flu shot  mammogram due 12/11.  PAP due 9/11 had BMET and HbA1c in 9/10 which was all normal. recheck oin 1-2 yrs  had ovarian cysts removeed in 3/11 and was  little anemic (Hb-10) post procedure. no vaginal bleeding postop. will check CBC today  Complete Medication List: 1)  Cyclobenzaprine Hcl 10 Mg Tabs (Cyclobenzaprine hcl) .... Take one tab twice daily as needed for muscle pain. 2)  Ventolin Hfa 108 (90 Base) Mcg/act Aers (Albuterol sulfate) .... Take 2 puffs every 4 hours as needed for shorness of breath. 3)  Etodolac 400 Mg Tabs (Etodolac) .... Take 1 tablet by mouth twice a day. 4)  Benzoyl Peroxide 5 % Gel (Benzoyl peroxide) .... Two times a day on the acne with the moisturizing cream 5)  Ammonium Lactate 12 % Lotn (Ammonium lactate) .... Apply with the acne cream twice daily  Patient Instructions: 1)  Please schedule a follow-up appointment in 6 months.  Prevention & Chronic Care Immunizations   Influenza vaccine: Not documented   Influenza vaccine deferral: Refused  (01/16/2009)   Influenza vaccine due: 12/12/2010    Tetanus booster: 10/21/2008: Td   Tetanus booster due: 10/22/2018    Pneumococcal vaccine: Not documented   Pneumococcal vaccine deferral: Refused  (09/24/2009)  Other Screening   Pap smear: NEGATIVE FOR INTRAEPITHELIAL LESIONS OR MALIGNANCY.  (01/08/2009)   Pap smear action/deferral: Ordered  (10/21/2008)   Pap smear due: 01/08/2010    Mammogram: ASSESSMENT: Negative - BI-RADS 1^MM DIGITAL SCREENING  (03/13/2009)   Mammogram action/deferral: Refused  (09/24/2009)   Mammogram due: 03/13/2010   Smoking status: never  (10/14/2009)  Lipids   Total Cholesterol: 162  (03/05/2009)   Lipid panel action/deferral: Refused   LDL: 97  (03/05/2009)   LDL Direct: Not documented   HDL: 49  (03/05/2009)   Triglycerides: 79  (03/05/2009)   Lipid panel due: 04/23/2009 w Process Orders Check Orders Results:     [facility]: ABN not required for this insurance Tests Sent for requisitioning (October 14, 2009 4:20 PM):     10/14/2009: [facility] -- T-CBC No Diff [[id number]] (signed)      10/14/2009: [facility] -- T-TSH [id number] (signed)     Vital Signs:  Patient profile:   45 year old female Height:      62 inches (157.48 cm) Weight:      234.03 pounds (106.38 kg) BMI:     42.96 Temp:     97.4 degrees F (36.33 degrees C) oral Pulse rate:   87 / minute BP sitting:   123 / 82  (right arm)  Vitals Entered By: Angelina Ok RN (October 14, 2009 3:39 PM)

## 2010-05-19 ENCOUNTER — Encounter: Payer: Self-pay | Admitting: Internal Medicine

## 2010-06-27 LAB — POCT URINALYSIS DIP (DEVICE)
Protein, ur: NEGATIVE mg/dL
Urobilinogen, UA: 0.2 mg/dL (ref 0.0–1.0)

## 2010-06-28 LAB — DIFFERENTIAL
Basophils Absolute: 0.4 10*3/uL — ABNORMAL HIGH (ref 0.0–0.1)
Basophils Relative: 3 % — ABNORMAL HIGH (ref 0–1)
Eosinophils Relative: 0 % (ref 0–5)
Lymphs Abs: 1.3 10*3/uL (ref 0.7–4.0)
Monocytes Relative: 4 % (ref 3–12)
Neutro Abs: 11.2 10*3/uL — ABNORMAL HIGH (ref 1.7–7.7)

## 2010-06-28 LAB — POCT CARDIAC MARKERS
CKMB, poc: <1 ng/mL — ABNORMAL LOW (ref 1.0–8.0)
Troponin i, poc: <0.05 ng/mL (ref 0.00–0.09)

## 2010-06-28 LAB — URINALYSIS, ROUTINE W REFLEX MICROSCOPIC
Bilirubin Urine: NEGATIVE
Glucose, UA: NEGATIVE mg/dL
Ketones, ur: NEGATIVE mg/dL
Leukocytes, UA: NEGATIVE
Nitrite: NEGATIVE
Protein, ur: NEGATIVE mg/dL

## 2010-06-28 LAB — CBC
HCT: 38.9 % (ref 36.0–46.0)
Hemoglobin: 13 g/dL (ref 12.0–15.0)
RBC: 4.6 MIL/uL (ref 3.87–5.11)
RDW: 15.7 % — ABNORMAL HIGH (ref 11.5–15.5)

## 2010-06-28 LAB — URINE MICROSCOPIC-ADD ON

## 2010-06-28 LAB — PREGNANCY, URINE: Preg Test, Ur: NEGATIVE

## 2010-07-01 LAB — DIFFERENTIAL
Lymphocytes Relative: 24 % (ref 12–46)
Lymphs Abs: 2.9 10*3/uL (ref 0.7–4.0)
Monocytes Relative: 4 % (ref 3–12)
Neutro Abs: 8.5 10*3/uL — ABNORMAL HIGH (ref 1.7–7.7)
Neutrophils Relative %: 71 % (ref 43–77)

## 2010-07-01 LAB — POCT I-STAT, CHEM 8
Chloride: 110 mEq/L (ref 96–112)
Creatinine, Ser: 0.8 mg/dL (ref 0.4–1.2)
Glucose, Bld: 125 mg/dL — ABNORMAL HIGH (ref 70–99)
Potassium: 3.4 mEq/L — ABNORMAL LOW (ref 3.5–5.1)
Sodium: 140 mEq/L (ref 135–145)

## 2010-07-01 LAB — CBC
Platelets: 310 10*3/uL (ref 150–400)
RBC: 4.55 MIL/uL (ref 3.87–5.11)
WBC: 12.1 10*3/uL — ABNORMAL HIGH (ref 4.0–10.5)

## 2010-07-06 LAB — CBC
HCT: 31 % — ABNORMAL LOW (ref 36.0–46.0)
HCT: 38.2 % (ref 36.0–46.0)
Hemoglobin: 10.2 g/dL — ABNORMAL LOW (ref 12.0–15.0)
Hemoglobin: 12.4 g/dL (ref 12.0–15.0)
MCHC: 32.3 g/dL (ref 30.0–36.0)
MCV: 85.2 fL (ref 78.0–100.0)
MCV: 85.5 fL (ref 78.0–100.0)
Platelets: 290 10*3/uL (ref 150–400)
RBC: 3.83 MIL/uL — ABNORMAL LOW (ref 3.87–5.11)
RBC: 4.56 MIL/uL (ref 3.87–5.11)
RDW: 16.5 % — ABNORMAL HIGH (ref 11.5–15.5)
WBC: 10.5 10*3/uL (ref 4.0–10.5)
WBC: 8.9 10*3/uL (ref 4.0–10.5)

## 2010-07-06 LAB — COMPREHENSIVE METABOLIC PANEL
ALT: 12 U/L (ref 0–35)
Alkaline Phosphatase: 57 U/L (ref 39–117)
BUN: 11 mg/dL (ref 6–23)
CO2: 23 mEq/L (ref 19–32)
Calcium: 9.2 mg/dL (ref 8.4–10.5)
GFR calc non Af Amer: >60 mL/min (ref >60)
Glucose, Bld: 96 mg/dL (ref 70–99)
Potassium: 4.9 mEq/L (ref 3.5–5.1)
Sodium: 136 mEq/L (ref 135–145)
Total Protein: 7.4 g/dL (ref 6.0–8.3)

## 2010-07-19 LAB — RAPID STREP SCREEN (MED CTR MEBANE ONLY): Streptococcus, Group A Screen (Direct): POSITIVE — AB

## 2010-07-22 LAB — COMPREHENSIVE METABOLIC PANEL
ALT: 12 U/L (ref 0–35)
AST: 15 U/L (ref 0–37)
Albumin: 3.4 g/dL — ABNORMAL LOW (ref 3.5–5.2)
Calcium: 8.7 mg/dL (ref 8.4–10.5)
Chloride: 110 mEq/L (ref 96–112)
Creatinine, Ser: 0.79 mg/dL (ref 0.4–1.2)
GFR calc Af Amer: >60 mL/min (ref >60)
Sodium: 140 mEq/L (ref 135–145)
Total Bilirubin: 0.3 mg/dL (ref 0.3–1.2)

## 2010-07-22 LAB — URINALYSIS, ROUTINE W REFLEX MICROSCOPIC
Bilirubin Urine: NEGATIVE
Ketones, ur: NEGATIVE mg/dL
Specific Gravity, Urine: 1.017 (ref 1.005–1.030)
Urobilinogen, UA: 0.2 mg/dL (ref 0.0–1.0)

## 2010-07-22 LAB — DIFFERENTIAL
Basophils Absolute: 0 10*3/uL (ref 0.0–0.1)
Basophils Relative: 0 % (ref 0–1)
Eosinophils Absolute: 0.1 10*3/uL (ref 0.0–0.7)
Eosinophils Relative: 0 % (ref 0–5)
Monocytes Absolute: 0.4 10*3/uL (ref 0.1–1.0)
Monocytes Relative: 3 % (ref 3–12)
Neutro Abs: 11.1 10*3/uL — ABNORMAL HIGH (ref 1.7–7.7)

## 2010-07-22 LAB — CBC
HCT: 41.7 % (ref 36.0–46.0)
Hemoglobin: 13.7 g/dL (ref 12.0–15.0)
MCHC: 32.9 g/dL (ref 30.0–36.0)
MCV: 85.2 fL (ref 78.0–100.0)
RBC: 4.9 MIL/uL (ref 3.87–5.11)
RDW: 16.5 % — ABNORMAL HIGH (ref 11.5–15.5)

## 2010-07-22 LAB — PREGNANCY, URINE: Preg Test, Ur: NEGATIVE

## 2010-07-22 LAB — URINE MICROSCOPIC-ADD ON

## 2010-08-24 ENCOUNTER — Encounter: Payer: Self-pay | Admitting: Internal Medicine

## 2010-08-24 ENCOUNTER — Ambulatory Visit (INDEPENDENT_AMBULATORY_CARE_PROVIDER_SITE_OTHER): Payer: Self-pay | Admitting: Internal Medicine

## 2010-08-24 ENCOUNTER — Telehealth: Payer: Self-pay | Admitting: *Deleted

## 2010-08-24 DIAGNOSIS — M545 Low back pain: Secondary | ICD-10-CM

## 2010-08-24 DIAGNOSIS — N390 Urinary tract infection, site not specified: Secondary | ICD-10-CM

## 2010-08-24 MED ORDER — ETODOLAC 400 MG PO TABS: 400.0000 mg | ORAL_TABLET | Freq: Two times a day (BID) | ORAL | Status: DC

## 2010-08-24 MED ORDER — CIPROFLOXACIN HCL 250 MG PO TABS: 250.0000 mg | ORAL_TABLET | Freq: Two times a day (BID) | ORAL | Status: AC

## 2010-08-24 MED ORDER — CYCLOBENZAPRINE HCL 10 MG PO TABS: 10.0000 mg | ORAL_TABLET | Freq: Two times a day (BID) | ORAL | Status: DC | PRN

## 2010-08-24 NOTE — Assessment & Plan Note (Signed)
[name] has a history of UTI in the past and is having increased frequency and suprapubic pain for 3 days. Her urine dipstick shows trace leukocytes and trace blood. Considering her symptoms and dipstick findings, will send his urine for culture and start her on ciprofloxacin 250 mg by mouth twice a day for next 3 days. Explain her to call the clinic if her symptoms will get better over the next 3-4 days.

## 2010-08-24 NOTE — Telephone Encounter (Signed)
Call from pt said that she is having abdominal pain.  Thinks she may have a UTI.  Pt said she is having some frequency.  Pt when asked said that she does have a temperature at times.  Tired.  Feels bad.  No odor to the urine per pt.  Given an appointment for this afternoon at 2:15 PM.

## 2010-08-24 NOTE — Assessment & Plan Note (Signed)
Has intermittent low back pain. Will refill Flexeril and etodolac today.

## 2010-08-24 NOTE — Progress Notes (Signed)
  Subjective:    Patient ID: Brandi Chase, female    DOB: 1966-03-30, 45 y.o.   MRN: [id number]  HPI Brandi Chase is a pleasant fortifier one with a past with a history of asthma, chronic low back pain who comes to the clinic which is complain of suprapubic pain and increased frequency of urination for last 3 days. She's having mild suprapubic pain about 3/10 and has to go to the urine more frequently than usual for last 3 days but denies any burning micturition or pain during urination. She had UTI a few times in the past and feels like she's having similar problem right now. Also denies any fever, chills, dizziness, headache. No chest pain, cough, short of breath, wheezing, diarrhea, constipation.    Review of Systems As per history of present illness    Objective:   Physical Exam    Constitutional: Vital signs reviewed.  Patient is obese, in no acute distress and cooperative with exam. Alert and oriented x3.  Head: Normocephalic and atraumatic Mouth: no erythema or exudates, MMM Eyes: PERRL, EOMI, conjunctivae normal, No scleral icterus.  Neck: Supple, Trachea midline normal ROM, No JVD, mass, thyromegaly, or carotid bruit present.  Cardiovascular: RRR, S1 normal, S2 normal, no MRG, pulses symmetric and intact bilaterally Pulmonary/Chest: CTAB, no wheezes, rales, or rhonchi Abdominal: Soft. Mildly tender to palpation of suprapubic region, non-distended, bowel sounds are normal, no masses, organomegaly, or guarding present.  GU: no CVA tenderness Musculoskeletal: No joint deformities, erythema, or stiffness, ROM full and no nontender Neurological: A&O x3, Strenght is normal and symmetric bilaterally, cranial nerve II-XII are grossly intact, no focal motor deficit, sensory intact to light touch bilaterally.  Skin: Warm, dry and intact. No rash, cyanosis, or clubbing.  Psychiatric: Normal mood and affect. speech and behavior is normal. Judgment and thought content normal. Cognition  and memory are normal.       Assessment & Plan:

## 2010-08-24 NOTE — Patient Instructions (Signed)
Please make a f/u appt in 3-4 months. Please take ciprofloxacin for 3 days total- 1 tab twice daily Also drink plenty of water.

## 2010-08-24 NOTE — Telephone Encounter (Signed)
Thank you :)

## 2010-08-25 ENCOUNTER — Encounter: Payer: Self-pay | Admitting: Internal Medicine

## 2010-08-27 LAB — URINE CULTURE: Colony Count: >=100000

## 2010-09-16 ENCOUNTER — Emergency Department (HOSPITAL_COMMUNITY)
Admission: EM | Admit: 2010-09-16 | Discharge: 2010-09-16 | Disposition: A | Discharge status: Home / Self Care | Payer: Self-pay | Attending: Emergency Medicine | Admitting: Emergency Medicine

## 2010-09-16 ENCOUNTER — Emergency Department (HOSPITAL_COMMUNITY): Payer: Self-pay

## 2010-09-16 DIAGNOSIS — R079 Chest pain, unspecified: Secondary | ICD-10-CM | POA: Insufficient documentation

## 2010-09-16 DIAGNOSIS — R072 Precordial pain: Secondary | ICD-10-CM | POA: Insufficient documentation

## 2010-09-16 LAB — TROPONIN I: Troponin I: <0.3 ng/mL (ref <0.30)

## 2010-09-16 LAB — CBC
MCHC: 32.1 g/dL (ref 30.0–36.0)
Platelets: 330 10*3/uL (ref 150–400)
RDW: 15.2 % (ref 11.5–15.5)
WBC: 13 10*3/uL — ABNORMAL HIGH (ref 4.0–10.5)

## 2010-09-16 LAB — BASIC METABOLIC PANEL
BUN: 11 mg/dL (ref 6–23)
Calcium: 9.1 mg/dL (ref 8.4–10.5)
GFR calc non Af Amer: >60 mL/min (ref >60)
Potassium: 4 mEq/L (ref 3.5–5.1)
Sodium: 137 mEq/L (ref 135–145)

## 2010-09-16 LAB — DIFFERENTIAL
Basophils Absolute: 0 10*3/uL (ref 0.0–0.1)
Eosinophils Absolute: 0.4 10*3/uL (ref 0.0–0.7)
Eosinophils Relative: 3 % (ref 0–5)

## 2010-09-16 LAB — CK TOTAL AND CKMB (NOT AT ARMC): Total CK: 113 U/L (ref 7–177)

## 2010-09-22 ENCOUNTER — Encounter: Payer: Self-pay | Admitting: Internal Medicine

## 2010-09-22 ENCOUNTER — Ambulatory Visit (INDEPENDENT_AMBULATORY_CARE_PROVIDER_SITE_OTHER): Payer: Self-pay | Admitting: Internal Medicine

## 2010-09-22 DIAGNOSIS — M545 Low back pain: Secondary | ICD-10-CM

## 2010-09-22 DIAGNOSIS — M25569 Pain in unspecified knee: Secondary | ICD-10-CM

## 2010-09-22 DIAGNOSIS — E079 Disorder of thyroid, unspecified: Secondary | ICD-10-CM

## 2010-09-22 DIAGNOSIS — J45909 Unspecified asthma, uncomplicated: Secondary | ICD-10-CM

## 2010-09-22 DIAGNOSIS — K219 Gastro-esophageal reflux disease without esophagitis: Secondary | ICD-10-CM

## 2010-09-22 DIAGNOSIS — M25562 Pain in left knee: Secondary | ICD-10-CM

## 2010-09-22 NOTE — Assessment & Plan Note (Signed)
Unclear etiology at this point. Unlikely septic arthritis, bursitis or ligament injury. This may be related to osteoarthritis. 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 She has chronic back pain since around 10 years. X-ray at that time revealed degenerative disease. I will refer the patient to sports medicine for further evaluation. Recommended to continue Lodine and Flexeril. Furthermore advised conservative measurements including decreasing weight and increasing exercise. Patient noted that she will have a personal trainer starting from tomorrow long period.

## 2010-09-22 NOTE — Assessment & Plan Note (Signed)
One year ago patient low TSH 0.318 but normal T4. Patient had recent ED visit for chest palpitation. Patient was ruled out for ACS. Chest x-ray did not show any acute process. I will recheck TSH today.

## 2010-09-22 NOTE — Assessment & Plan Note (Signed)
Patient noted that she is using her albuterol inhaler just very occasionally. On physical exam and wheezing breasts notable. Continue current regimen.

## 2010-09-22 NOTE — Progress Notes (Signed)
  Subjective:    Patient ID: Brandi Chase, female    DOB: Sep 16, 1965, 45 y.o.   MRN: [id number]  HPI This is a 45 year old female with possibly significant for chronic back pain since 10 month and asthma who presented to the clinic for left knee pain which has been present since six-month period Knee pain: Status 6 month ago when she was trying to stand up on the counter top. Since then she is experiencing a feeling of tightness in her left knee. She has troubles to walk up stairs because of this feeling. She denies any crepitus. She denies any trauma, fevers chills, significant swelling or redness, morning stiffness. Denies any significant lower extremities. She noted since today for knee discomfort has improved since using Flexeril and Lodine. He did not experience similar symptoms prior to December 2011. Constipation: She noted that she had some episodes of constipation. She has been using some stool softener over-the-counter since then it has improved. She does not recall the name. ED visit on 09/16/2010 for chest pain: ACS ruled out with negative cardiac enzymes. Chest x-ray did not show any acute process. Patient was discharged with a diagnosis of possibly anxiety used chest discomfort and was prescribed Ativan when necessary 3 times a day. Furthermore patient was prescribed omeprazole 20 for possible reflux.    Review of Systems  Constitutional: Negative for fever and chills.  Respiratory: Negative for choking, chest tightness and shortness of breath.   Cardiovascular: Negative for chest pain and palpitations.  Gastrointestinal: Positive for constipation. Negative for abdominal pain, diarrhea and abdominal distention.  Genitourinary: Negative for difficulty urinating.  Musculoskeletal: Positive for back pain. Negative for joint swelling and gait problem.  Neurological: Negative for weakness and numbness.       Objective:   Physical Exam  Constitutional: She appears well-developed  and well-nourished.  HENT:  Head: Normocephalic.  Neck: Neck supple.  Cardiovascular: Normal rate, regular rhythm and normal heart sounds.   Pulmonary/Chest: Effort normal and breath sounds normal. No respiratory distress. She has no wheezes. She has no rales. She exhibits no tenderness.  Abdominal: Soft. Bowel sounds are normal. She exhibits no distension. There is no tenderness. There is no rebound.  Musculoskeletal:       Left knee: She exhibits bony tenderness. She exhibits normal range of motion, no swelling, no effusion, no deformity, no erythema, normal alignment and normal patellar mobility. tenderness found.          Assessment & Plan:

## 2010-09-23 LAB — TSH: TSH: 0.51 u[IU]/mL (ref 0.350–4.500)

## 2010-09-28 ENCOUNTER — Encounter: Payer: Self-pay | Admitting: Family Medicine

## 2010-09-28 ENCOUNTER — Ambulatory Visit (INDEPENDENT_AMBULATORY_CARE_PROVIDER_SITE_OTHER): Payer: Self-pay | Admitting: Family Medicine

## 2010-09-28 VITALS — BP 124/80 | Ht 62.0 in | Wt 234.0 lb

## 2010-09-28 DIAGNOSIS — M6580 Other synovitis and tenosynovitis, unspecified site: Secondary | ICD-10-CM

## 2010-09-28 DIAGNOSIS — M76892 Other specified enthesopathies of left lower limb, excluding foot: Secondary | ICD-10-CM

## 2010-09-28 NOTE — Progress Notes (Signed)
  Subjective:    Patient ID: Brandi Chase, female    DOB: 01-Jan-1966, 45 y.o.   MRN: [id number]  HPI  Patient with left knee pain. She helped her daughter move into a new house and was going up and down several flights of stairs over the weekend. Next day she noticed pain at the top of her kneecap. He has gotten some better. She's had no giving way, no locking. She's not noticed any swelling. There's been no warmth to the knee. Pain is mostly with walking or changing position from seated to standing. Stairs are painful.  PERTINENT  PMH / PSH: No prior history of knee injury or knee surgery on the left. Review of Systems    denies any recent weight gain or weight loss that is unusual. Denies numbness or tingling in her left lower extremity. Objective:   Physical Exam    general: Well-developed overweight female no acute distress KNEE: Left knee mild tenderness to palpation at the insertion of the quadriceps tendon onto the patella. Negative patellar apprehension test. Ligamentously intact there is a valgus stress. Anterior drawer reveals good endpoint. McMurray's is negative.  ;  Muscle skeletal ultrasound: Poor quality secondary to habitus. I was able see the entire length of the patellar tendon and it is intact. The quadriceps tendon has small amount of fluid at the insertion onto the patella. There is no excess or increased vascular activity. I was unable to complete the rest of the knee exam secondary to body habitus    Assessment & Plan:  Quadriceps tendonitis acute: We'll have her do some short arc quad exercises. She was given a handout and is demonstrated in clinic she will ice after these. If she's not resolving in the next 1-2 weeks she'll let us know.

## 2010-10-20 ENCOUNTER — Encounter: Payer: Self-pay | Admitting: Internal Medicine

## 2010-11-09 ENCOUNTER — Encounter: Payer: Self-pay | Admitting: Internal Medicine

## 2010-11-09 ENCOUNTER — Ambulatory Visit (INDEPENDENT_AMBULATORY_CARE_PROVIDER_SITE_OTHER): Payer: Self-pay | Admitting: Internal Medicine

## 2010-11-09 DIAGNOSIS — J45909 Unspecified asthma, uncomplicated: Secondary | ICD-10-CM

## 2010-11-09 DIAGNOSIS — D649 Anemia, unspecified: Secondary | ICD-10-CM

## 2010-11-09 DIAGNOSIS — M545 Low back pain, unspecified: Secondary | ICD-10-CM

## 2010-11-09 DIAGNOSIS — N949 Unspecified condition associated with female genital organs and menstrual cycle: Secondary | ICD-10-CM

## 2010-11-09 DIAGNOSIS — N9489 Other specified conditions associated with female genital organs and menstrual cycle: Secondary | ICD-10-CM

## 2010-11-09 LAB — FERRITIN: Ferritin: 8 ng/mL — ABNORMAL LOW (ref 10–291)

## 2010-11-09 LAB — IRON AND TIBC
%SAT: 14 % — ABNORMAL LOW (ref 20–55)
Iron: 47 ug/dL (ref 42–145)
TIBC: 328 ug/dL (ref 250–470)
UIBC: 281 ug/dL

## 2010-11-09 MED ORDER — POLYETHYLENE GLYCOL 3350 17 G PO PACK: 17.0000 g | PACK | Freq: Every day | ORAL | Status: AC

## 2010-11-09 NOTE — Patient Instructions (Signed)
Follow up in 6 months 

## 2010-11-09 NOTE — Assessment & Plan Note (Signed)
Stable

## 2010-11-09 NOTE — Progress Notes (Signed)
A 45 year old woman with arthritis, asthma comes to the clinic for followup. Complains of dull aching pain in the right lower corner in and says that this pain feels similar to what she had when she [facility] Wants a referral to gynecology. No other complaints today.  Constitutional: Denies fever, chills, diaphoresis, appetite change and fatigue.  HEENT: Denies photophobia, eye pain, redness, hearing loss, ear pain, congestion, sore throat, rhinorrhea, sneezing, mouth sores, trouble swallowing, neck pain, neck stiffness and tinnitus.   Respiratory: Denies SOB, DOE, cough, chest tightness,  and wheezing.   Cardiovascular: Denies chest pain, palpitations and leg swelling.  Gastrointestinal: Denies nausea, vomiting, abdominal pain, diarrhea, constipation, blood in stool and abdominal distention.  Genitourinary: Denies dysuria, urgency, frequency, hematuria, flank pain and difficulty urinating.  Musculoskeletal: Denies myalgias, back pain, joint swelling, arthralgias and gait problem.  Skin: Denies pallor, rash and wound.  Neurological: Denies dizziness, seizures, syncope, weakness, light-headedness, numbness and headaches.  Hematological: Denies adenopathy. Easy bruising, personal or family bleeding history  Psychiatric/Behavioral: Denies suicidal ideation, mood changes, confusion, nervousness, sleep disturbance and agitation  BP 123/83  Pulse 84  Temp(Src) 97.6 F (36.4 C) (Oral)  Ht 5\' 2"  (1.575 m)  Wt 234 lb 9.6 oz (106.414 kg)  BMI 42.91 kg/m2  SpO2 98%  LMP 10/21/2010  General Appearance:    Alert, cooperative, no distress, appears stated age  Head:    Normocephalic, without obvious abnormality, atraumatic  Eyes:    PERRL, conjunctiva/corneas clear, EOM's intact, fundi    benign, both eyes  Ears:    Normal TM's and external ear canals, both ears  Nose:   Nares normal, septum midline, mucosa normal, no drainage    or  sinus tenderness  Throat:   Lips, mucosa, and tongue normal; teeth and gums normal  Neck:   Supple, symmetrical, trachea midline, no adenopathy;    thyroid:  no enlargement/tenderness/nodules; no carotid   bruit or JVD  Back:     Symmetric, no curvature, ROM normal, no CVA tenderness  Lungs:     Clear to auscultation bilaterally, respirations unlabored  Chest Wall:    No tenderness or deformity   Heart:    Regular rate and rhythm, S1 and S2 normal, no murmur, rub   or gallop  Breast Exam:    No tenderness, masses, or nipple abnormality  Abdomen:     Soft, non-tender, bowel sounds active all four quadrants,    no masses, no organomegaly  Extremities:   Extremities normal, atraumatic, no cyanosis or edema  Pulses:   2+ and symmetric all extremities  Skin:   Skin color, texture, turgor normal, no rashes or lesions  Lymph nodes:   Cervical, supraclavicular, and axillary nodes normal  Neurologic:   CNII-XII intact, normal strength, sensation and reflexes    throughout

## 2010-11-09 NOTE — Assessment & Plan Note (Signed)
Continue current medications. Pain well-controlled.

## 2010-11-10 ENCOUNTER — Encounter: Payer: Self-pay | Admitting: Internal Medicine

## 2010-11-12 ENCOUNTER — Encounter: Payer: Self-pay | Admitting: Internal Medicine

## 2010-11-12 ENCOUNTER — Other Ambulatory Visit: Payer: Self-pay | Admitting: Internal Medicine

## 2010-11-12 DIAGNOSIS — D509 Iron deficiency anemia, unspecified: Secondary | ICD-10-CM

## 2010-11-12 MED ORDER — FERROUS SULFATE 325 (65 FE) MG PO TABS: 325.0000 mg | ORAL_TABLET | Freq: Every day | ORAL | Status: DC

## 2010-11-12 NOTE — Progress Notes (Signed)
Error

## 2010-11-26 ENCOUNTER — Ambulatory Visit (INDEPENDENT_AMBULATORY_CARE_PROVIDER_SITE_OTHER): Payer: Self-pay | Admitting: Internal Medicine

## 2010-11-26 ENCOUNTER — Encounter: Payer: Self-pay | Admitting: Internal Medicine

## 2010-11-26 VITALS — BP 125/86 | HR 81 | Temp 97.7°F | Ht 62.0 in | Wt 235.6 lb

## 2010-11-26 DIAGNOSIS — H9209 Otalgia, unspecified ear: Secondary | ICD-10-CM

## 2010-11-26 NOTE — Progress Notes (Signed)
  Subjective:    Patient ID: Brandi Chase, female    DOB: 1966-01-08, 45 y.o.   MRN: [id number]  HPI 45 yo F with 5-6 days of bilateral ear burning and pulling feeling. Her right ear hurts slightly worse than her left. In addition, she has a headache over her right eye for 2-3 days. She had a subjective fever yesterday that responded to Tylenol. She denies cough, rhinorrhea, eye redness, nausea or vomiting, or changes in bowel or bladder habits.  She has no known history of sinus infections. She has not been swimming recently. She does have a history of left earwax accumulation, that was treated 8 or 9 years ago with frequent removal. She says that this pain feels different from that. She has no history of childhood ear problems.   Review of Systems Significant for intermittent abdominal pain ever since her cyst removal last year.    Objective:   Physical Exam  Constitutional: She appears well-developed and well-nourished. No distress.  HENT:  Head: Normocephalic and atraumatic.  Right Ear: Tympanic membrane and external ear normal. Tympanic membrane is not erythematous and not bulging.  Left Ear: Tympanic membrane and external ear normal. Tympanic membrane is not erythematous and not bulging.  Nose: Nose normal.  Mouth/Throat: Oropharynx is clear and moist and mucous membranes are normal. No oropharyngeal exudate.       She has moderate amount of wax bilaterally. However the right TM can't be visualized and is normal. The left TM cannot be visualized (but hurts less than the right)  Eyes: Conjunctivae are normal. Right eye exhibits no discharge. Left eye exhibits no discharge.  Neck: Neck supple.  Lymphadenopathy:    She has no cervical adenopathy.          Assessment & Plan:

## 2010-11-26 NOTE — Assessment & Plan Note (Signed)
5-6 days of bilateral ear pain without significant rhinorrhea or cough. TMs look normal, and patient does have mild right frontal sinus tenderness. She had one subjective fever yesterday but is afebrile today on exam. Given that she is relatively well-appearing, has normal TMs (but moderate wax bilaterally) and has minimal symptoms, she likely has a mild viral infection versus an allergic response. We discussed the pros and cons of a nasal steroid with this patient and we decided to proceed with conservative management. She is in fact feeling better today when compared with yesterday. We expect that this will resolve over the next few days. --Nasal saline spray --Nasal decongestants over-the-counter --Followup if no improvement

## 2010-11-30 ENCOUNTER — Telehealth: Payer: Self-pay | Admitting: *Deleted

## 2010-11-30 ENCOUNTER — Encounter: Payer: Self-pay | Admitting: Internal Medicine

## 2010-11-30 ENCOUNTER — Ambulatory Visit (INDEPENDENT_AMBULATORY_CARE_PROVIDER_SITE_OTHER): Payer: Self-pay | Admitting: Internal Medicine

## 2010-11-30 VITALS — BP 150/92 | HR 92 | Temp 97.5°F | Wt 238.6 lb

## 2010-11-30 DIAGNOSIS — N390 Urinary tract infection, site not specified: Secondary | ICD-10-CM | POA: Insufficient documentation

## 2010-11-30 LAB — URINALYSIS, ROUTINE W REFLEX MICROSCOPIC
Glucose, UA: NEGATIVE mg/dL
Hgb urine dipstick: NEGATIVE
Leukocytes, UA: NEGATIVE
Protein, ur: NEGATIVE mg/dL
Urobilinogen, UA: 0.2 mg/dL (ref 0.0–1.0)

## 2010-11-30 NOTE — Assessment & Plan Note (Signed)
Patient's symptoms are consistent with urinary tract infection. We will treat with a course of antibiotic Bactrim double strength twice daily. Patient was advised if her symptoms do not improve or get worse she needs to call us back for further evaluation. We will obtain urinalysis and urine culture.

## 2010-11-30 NOTE — Telephone Encounter (Signed)
Walk-in after call, thinks she has bladder infection

## 2010-11-30 NOTE — Progress Notes (Signed)
  Subjective:    Patient ID: Brandi Chase, female    DOB: 1966/01/04, 45 y.o.   MRN: [id number]  HPI  Patient 45 year old female with past medical history who presents to clinic with main concern of urinary frequency and urgency this started approximately 3 days ago associated with subjective fevers and chills. In addition patient reports urinary dribbling and sensation of incomplete voiding. She denies hematuria. She also denies nausea vomiting, systemic symptoms of weight loss or night sweats. Her last episode of urinary tract infection was August 2011. She denies recent sicknesses or hospitalizations, no episodes of chest pain or shortness of breath, no abdominal or concerns.  Review of Systems Per HPI    Objective:   Physical Exam  Constitutional: Vital signs reviewed.  Patient is a well-developed and well-nourished  in no acute distress and cooperative with exam. Alert and oriented x3.  Cardiovascular: RRR, S1 normal, S2 normal, no MRG, pulses symmetric and intact bilaterally Pulmonary/Chest: CTAB, no wheezes, rales, or rhonchi Abdominal: Soft. Non-tender, non-distended, bowel sounds are normal, no masses, organomegaly, or guarding present.  GU: no CVA tenderness        Assessment & Plan:

## 2010-12-01 LAB — URINALYSIS, MICROSCOPIC ONLY

## 2010-12-01 NOTE — Telephone Encounter (Signed)
Patient seen in clinic today

## 2010-12-11 ENCOUNTER — Ambulatory Visit (INDEPENDENT_AMBULATORY_CARE_PROVIDER_SITE_OTHER): Payer: Self-pay | Admitting: Internal Medicine

## 2010-12-11 ENCOUNTER — Encounter: Payer: Self-pay | Admitting: Internal Medicine

## 2010-12-11 VITALS — BP 125/84 | HR 85 | Temp 97.4°F | Ht 62.0 in | Wt 234.0 lb

## 2010-12-11 DIAGNOSIS — J309 Allergic rhinitis, unspecified: Secondary | ICD-10-CM | POA: Insufficient documentation

## 2010-12-11 DIAGNOSIS — J45909 Unspecified asthma, uncomplicated: Secondary | ICD-10-CM

## 2010-12-11 DIAGNOSIS — J029 Acute pharyngitis, unspecified: Secondary | ICD-10-CM | POA: Insufficient documentation

## 2010-12-11 LAB — RAPID STREP SCREEN (MED CTR MEBANE ONLY)

## 2010-12-11 MED ORDER — CETIRIZINE HCL 10 MG PO TABS: 10.0000 mg | ORAL_TABLET | Freq: Every day | ORAL | Status: DC

## 2010-12-11 MED ORDER — ALBUTEROL SULFATE HFA 108 (90 BASE) MCG/ACT IN AERS: 2.0000 | INHALATION_SPRAY | RESPIRATORY_TRACT | Status: DC | PRN

## 2010-12-11 NOTE — Progress Notes (Signed)
Subjective:   Patient ID: Brandi Chase female   DOB: [birth year] 45 y.o.   MRN: [id number]  HPI: [name] is a 45 y.o. woman who presents to clinic today complaining of a cough with production of yellow to brown sputum.  She states that the cough started on Sunday the 26th but has been getting better over the last 2 days with less production.  She denies any fever, chills, nausea, vomiting, SOB, post nasal drip, or sick contacts.  She has some mild pain with swallowing on the left side of her throat.  She has been using sore throat spray which helps the pain.  She has not taken anything for the cough.  She has a past medical history of asthma and doesn't know if these symptoms follow a seasonal pattern.    Past Medical History  Diagnosis Date  . Lower back pain   . Asthma   . Adnexal cyst 2011    removed   Current Outpatient Prescriptions  Medication Sig Dispense Refill  . albuterol (VENTOLIN HFA) 108 (90 BASE) MCG/ACT inhaler Inhale 2 puffs into the lungs every 4 (four) hours as needed. For shortness of breath.       . cyclobenzaprine (FLEXERIL) 10 MG tablet Take 1 tablet (10 mg total) by mouth 2 (two) times daily as needed. For muscle pain.  30 tablet  0  . etodolac (LODINE) 400 MG tablet Take 1 tablet (400 mg total) by mouth 2 (two) times daily.  30 tablet  0  . ferrous sulfate 325 (65 FE) MG tablet Take 1 tablet (325 mg total) by mouth daily.  30 tablet  3  . LORazepam (ATIVAN) 1 MG tablet Take 1 mg by mouth every 8 (eight) hours as needed. Prescribed by ED when evaluated for chest pain on 09/16/2010       . omeprazole (PRILOSEC) 20 MG capsule Take 20 mg by mouth daily. Prescribed by the ED after being evaluated for chest pain on 09/16/2010        No family history on file. History   Social History  . Marital Status: Single    Spouse Name: N/A    Number of Children: N/A  . Years of Education: N/A   Social History Main Topics  . Smoking status: Never Smoker     . Smokeless tobacco: None  . Alcohol Use: No  . Drug Use: No  . Sexually Active: None   Other Topics Concern  . None   Social History Narrative  . None   Review of Systems: Constitutional: Denies fever, chills, diaphoresis, appetite change and fatigue.  HEENT: Denies photophobia, eye pain, redness, hearing loss, ear pain, congestion, sore throat, rhinorrhea, sneezing, mouth sores, trouble swallowing, neck pain, neck stiffness and tinnitus.   Respiratory: Denies SOB, DOE, cough, chest tightness,  and wheezing.   Cardiovascular: Denies chest pain, palpitations and leg swelling.  Gastrointestinal: Denies nausea, vomiting, abdominal pain, diarrhea, constipation, blood in stool and abdominal distention.  Genitourinary: Denies dysuria, urgency, frequency, hematuria, flank pain and difficulty urinating.  Musculoskeletal: Denies myalgias, back pain, joint swelling, arthralgias and gait problem.  Skin: Denies pallor, rash and wound.  Neurological: Denies dizziness, seizures, syncope, weakness, light-headedness, numbness and headaches.  Hematological: Denies adenopathy. Easy bruising, personal or family bleeding history  Psychiatric/Behavioral: Denies suicidal ideation, mood changes, confusion, nervousness, sleep disturbance and agitation  Objective:  Physical Exam: Filed Vitals:   12/11/10 1541  BP: 125/84  Pulse: 85  Temp: 97.4 F (36.3 C)  TempSrc: Oral  Height: 5\' 2"  (1.575 m)  Weight: 234 lb (106.142 kg)   Constitutional: Vital signs reviewed.  Patient is a well-developed and well-nourished woman in no acute distress and cooperative with exam. Alert and oriented x3.  Head: Normocephalic and atraumatic Ear: TM normal bilaterally Mouth: no erythema in the posterior pharynx.  There is some mild clear to whitish material covering the large tonsils, no cobble stoning or drainage noted. .  MMM.  Nose: there is some mild redness over the turbinates with mild polyp formation.  Eyes:  PERRL, EOMI, conjunctivae normal, No scleral icterus.  Neck: Supple, Trachea midline normal ROM, No JVD, mass, thyromegaly, or carotid bruit present.  Cardiovascular: RRR, S1 normal, S2 normal, no MRG, pulses symmetric and intact bilaterally Pulmonary/Chest: CTAB, no wheezes, rales, or rhonchi Abdominal: Soft. Non-tender, non-distended, bowel sounds are normal, no masses, organomegaly, or guarding present.  GU: no CVA tenderness Musculoskeletal: No joint deformities, erythema, or stiffness, ROM full and no nontender Hematology: no cervical, inginal, or axillary adenopathy.  Neurological: A&O x3, Strength is normal and symmetric bilaterally, cranial nerve II-XII are grossly intact, no focal motor deficit, sensory intact to light touch bilaterally.  Skin: Warm, dry and intact. No rash, cyanosis, or clubbing.   Assessment & Plan:

## 2010-12-11 NOTE — Patient Instructions (Addendum)
I will follow up on the result of the test.  If it is positive I will call you.  If you don't hear from me, no news is good news.  Start the Zyrtec (cetirizine) 10 mg tablets take one tab daily for the allergies  Use the albuterol 1 puff 4 times daily for a few days to help with your breathing.  Everything should slowly get better over time.  Follow up with [place] if you continue to have problems.

## 2010-12-15 NOTE — Assessment & Plan Note (Signed)
With the questionable exudate over the tonsils we will check a rapid strep and treat as indicated.  Office Visit on 12/11/2010  Component Date Value Range Status  . Streptococcus, Group A Screen (Dir*  12/11/2010 NEG  NEGATIVE Final  . Source  12/11/2010 THROAT   Final   Rapid strep is negative so we will treat with salt water gargles.

## 2010-12-15 NOTE — Assessment & Plan Note (Signed)
Her nasal symptoms as well as the history of asthma may indicate an allergic rhinitis picture.  We will have her start Zyrtec 10 mg daily and then see how she is doing.

## 2010-12-15 NOTE — Assessment & Plan Note (Signed)
She carries a diagnosis of asthma.  Her illness sounds like she has bronchitis and is slowly getting better.  We will treat this symptomatically with albuterol and continue to follow with symptomatic treatment.

## 2010-12-29 ENCOUNTER — Encounter: Payer: Self-pay | Admitting: Internal Medicine

## 2011-01-01 LAB — I-STAT 8, (EC8 V) (CONVERTED LAB)
Acid-base deficit: 3 — ABNORMAL HIGH
Bicarbonate: 22
Glucose, Bld: 100 — ABNORMAL HIGH
HCT: 43
Hemoglobin: 14.6
Operator id: 247071
Potassium: 4
Sodium: 139
TCO2: 23

## 2011-01-01 LAB — RAPID STREP SCREEN (MED CTR MEBANE ONLY): Streptococcus, Group A Screen (Direct): NEGATIVE

## 2011-03-02 ENCOUNTER — Encounter (HOSPITAL_COMMUNITY): Payer: Self-pay | Admitting: *Deleted

## 2011-03-02 ENCOUNTER — Emergency Department (HOSPITAL_COMMUNITY)
Admission: EM | Admit: 2011-03-02 | Discharge: 2011-03-02 | Disposition: A | Discharge status: Home / Self Care | Payer: Self-pay | Attending: Emergency Medicine | Admitting: Emergency Medicine

## 2011-03-02 DIAGNOSIS — N760 Acute vaginitis: Secondary | ICD-10-CM | POA: Insufficient documentation

## 2011-03-02 DIAGNOSIS — A499 Bacterial infection, unspecified: Secondary | ICD-10-CM | POA: Insufficient documentation

## 2011-03-02 DIAGNOSIS — N390 Urinary tract infection, site not specified: Secondary | ICD-10-CM | POA: Insufficient documentation

## 2011-03-02 DIAGNOSIS — B9689 Other specified bacterial agents as the cause of diseases classified elsewhere: Secondary | ICD-10-CM | POA: Insufficient documentation

## 2011-03-02 LAB — URINALYSIS, ROUTINE W REFLEX MICROSCOPIC
Bilirubin Urine: NEGATIVE
Glucose, UA: NEGATIVE mg/dL
Specific Gravity, Urine: 1.024 (ref 1.005–1.030)
Urobilinogen, UA: 0.2 mg/dL (ref 0.0–1.0)
pH: 5.5 (ref 5.0–8.0)

## 2011-03-02 LAB — WET PREP, GENITAL
Trich, Wet Prep: NONE SEEN
Yeast Wet Prep HPF POC: NONE SEEN

## 2011-03-02 LAB — URINE MICROSCOPIC-ADD ON

## 2011-03-02 MED ORDER — METRONIDAZOLE 500 MG PO TABS: 500.0000 mg | ORAL_TABLET | Freq: Two times a day (BID) | ORAL | Status: AC

## 2011-03-02 MED ORDER — NITROFURANTOIN MONOHYD MACRO 100 MG PO CAPS: 100.0000 mg | ORAL_CAPSULE | Freq: Two times a day (BID) | ORAL | Status: AC

## 2011-03-02 NOTE — ED Provider Notes (Signed)
History     CSN: [id number] Arrival date & time: 03/02/2011  4:05 PM   First MD Initiated Contact with Patient 03/02/11 1753      Chief Complaint  Patient presents with  . Urinary Tract Infection    (Consider location/radiation/quality/duration/timing/severity/associated sxs/prior treatment) Patient is a 45 y.o. female presenting with urinary tract infection. The history is provided by the patient. No language interpreter was used.  Urinary Tract Infection This is a new problem. The current episode started more than 1 week ago. The problem occurs constantly. The problem has not changed since onset.Pertinent negatives include no chest pain, no abdominal pain, no headaches and no shortness of breath. The symptoms are aggravated by nothing. The symptoms are relieved by nothing. She has tried nothing for the symptoms. The treatment provided no relief.    Past Medical History  Diagnosis Date  . Lower back pain   . Asthma   . Adnexal cyst 2011    removed    Past Surgical History  Procedure Date  . Tubal ligation   . Breast reduction surgery     No family history on file.  History  Substance Use Topics  . Smoking status: Never Smoker   . Smokeless tobacco: Not on file  . Alcohol Use: No    OB History    Grav Para Term Preterm Abortions TAB SAB Ect Mult Living                  Review of Systems  Constitutional: Negative for activity change.  HENT: Negative for facial swelling.   Eyes: Negative for discharge.  Respiratory: Negative for shortness of breath.   Cardiovascular: Negative for chest pain.  Gastrointestinal: Negative for abdominal pain and abdominal distention.  Genitourinary: Positive for dysuria, frequency and vaginal discharge. Negative for flank pain.  Neurological: Negative for headaches.  Hematological: Negative.   Psychiatric/Behavioral: Negative.     Allergies  Bactrim ds  Home Medications   Current Outpatient Rx  Name Route Sig Dispense  Refill  . ACETAMINOPHEN 325 MG PO TABS Oral Take 650 mg by mouth every 6 (six) hours as needed.      . CYCLOBENZAPRINE HCL 10 MG PO TABS Oral Take 1 tablet (10 mg total) by mouth 2 (two) times daily as needed. For muscle pain. 30 tablet 0  . ETODOLAC 400 MG PO TABS Oral Take 1 tablet (400 mg total) by mouth 2 (two) times daily. 30 tablet 0  . FERROUS SULFATE 325 (65 FE) MG PO TABS Oral Take 1 tablet (325 mg total) by mouth daily. 30 tablet 3  . ALBUTEROL SULFATE HFA 108 (90 BASE) MCG/ACT IN AERS Inhalation Inhale 2 puffs into the lungs every 4 (four) hours as needed. For shortness of breath. 6.7 g 4  . CETIRIZINE HCL 10 MG PO TABS Oral Take 1 tablet (10 mg total) by mouth daily.    Marland Kitchen LORAZEPAM 1 MG PO TABS Oral Take 1 mg by mouth every 8 (eight) hours as needed. Prescribed by ED when evaluated for chest pain on 09/16/2010     . OMEPRAZOLE 20 MG PO CPDR Oral Take 20 mg by mouth daily. Prescribed by the ED after being evaluated for chest pain on 09/16/2010       BP 151/86  Pulse 89  Temp(Src) 98.8 F (37.1 C) (Oral)  Resp 16  Ht 5\' 2"  (1.575 m)  Wt 235 lb (106.595 kg)  BMI 42.98 kg/m2  SpO2 100%  LMP 02/06/2011  Physical Exam  Constitutional: She is oriented to person, place, and time. She appears well-developed and well-nourished.  HENT:  Head: Normocephalic and atraumatic.  Eyes: Conjunctivae and EOM are normal. Pupils are equal, round, and reactive to light.  Neck: Normal range of motion. Neck supple.  Cardiovascular: Normal rate and regular rhythm.   Pulmonary/Chest: Effort normal and breath sounds normal. No respiratory distress.  Abdominal: Soft. Bowel sounds are normal. She exhibits no mass. There is no tenderness. There is no rebound and no guarding.  Genitourinary: Uterus normal. Pelvic exam was performed with patient in the knee-chest position. Cervix exhibits discharge. Cervix exhibits no motion tenderness. Right adnexum displays no tenderness. Left adnexum displays no  tenderness. No tenderness or bleeding around the vagina. Vaginal discharge found.  Musculoskeletal: Normal range of motion.  Neurological: She is alert and oriented to person, place, and time.  Psychiatric: Thought content normal.    ED Course  Procedures (including critical care time)  Labs Reviewed  URINALYSIS, ROUTINE W REFLEX MICROSCOPIC - Abnormal; Notable for the following:    Hgb urine dipstick TRACE (*)    All other components within normal limits  WET PREP, GENITAL - Abnormal; Notable for the following:    Clue Cells, Wet Prep MANY (*)    WBC, Wet Prep HPF POC RARE (*)    All other components within normal limits  URINE MICROSCOPIC-ADD ON - Abnormal; Notable for the following:    Squamous Epithelial / LPF FEW (*)    Bacteria, UA MANY (*)    All other components within normal limits  POCT PREGNANCY, URINE  POCT PREGNANCY, URINE  GC/CHLAMYDIA PROBE AMP, GENITAL   No results found.   No diagnosis found.    MDM  Take all antibiotics.  Return for fevers, chills worsening pain or any concerns.  Follow up with your doctor for your pap smear.  Patient verbalizes understanding and agrees to follow up        Nayra Coury K Durinda Buzzelli-Rasch, MD 03/02/11 2029

## 2011-03-02 NOTE — ED Notes (Signed)
Pt says she thinks she has a UTI, has been having right lower abdominal pain, lower back pain, odorous urine x3 weeks

## 2011-03-03 LAB — GC/CHLAMYDIA PROBE AMP, GENITAL
Chlamydia, DNA Probe: NEGATIVE
GC Probe Amp, Genital: NEGATIVE

## 2011-09-15 ENCOUNTER — Encounter (HOSPITAL_COMMUNITY): Payer: Self-pay | Admitting: Family Medicine

## 2011-09-15 ENCOUNTER — Emergency Department (HOSPITAL_COMMUNITY)
Admission: EM | Admit: 2011-09-15 | Discharge: 2011-09-16 | Disposition: A | Discharge status: Home / Self Care | Payer: Self-pay | Attending: Emergency Medicine | Admitting: Emergency Medicine

## 2011-09-15 DIAGNOSIS — M62838 Other muscle spasm: Secondary | ICD-10-CM | POA: Insufficient documentation

## 2011-09-15 DIAGNOSIS — J45909 Unspecified asthma, uncomplicated: Secondary | ICD-10-CM | POA: Insufficient documentation

## 2011-09-15 HISTORY — DX: Pain in right hip: M25.551

## 2011-09-15 NOTE — ED Provider Notes (Signed)
History     CSN: [id number]  Arrival date & time 09/15/11  2043   First MD Initiated Contact with Patient 09/15/11 2315      Chief Complaint  Patient presents with  . Spasms    Legs    (Consider location/radiation/quality/duration/timing/severity/associated sxs/prior treatment) HPI Comments: Patient reports she has been having leg spasms at night for years, states over the past 2 weeks they have gotten more severe.  States that her legs are now sore from all of the spasms at night.  Pt has been seen in the past by [facility], but has not been seen for over a year.  States that she has been taking vinegar and potassium pills for her cramps because this is what was prescribed by Outpt clinics.  Denies fevers, back pain, leg swelling, weakness or numbness of the legs.    The history is provided by the patient.    Past Medical History  Diagnosis Date  . Lower back pain   . Asthma   . Adnexal cyst 2011    removed  . Hip pain, right     Past Surgical History  Procedure Date  . Tubal ligation   . Breast reduction surgery     No family history on file.  History  Substance Use Topics  . Smoking status: Never Smoker   . Smokeless tobacco: Not on file  . Alcohol Use: No    OB History    Grav Para Term Preterm Abortions TAB SAB Ect Mult Living                  Review of Systems  Constitutional: Negative for fever and chills.  Respiratory: Negative for shortness of breath.   Cardiovascular: Negative for chest pain, palpitations and leg swelling.  Musculoskeletal: Negative for back pain.  Skin: Negative for wound.  Neurological: Negative for weakness and numbness.    Allergies  Bactrim ds and Primatene mist  Home Medications   Current Outpatient Rx  Name Route Sig Dispense Refill  . ACETAMINOPHEN 325 MG PO TABS Oral Take 650 mg by mouth every 6 (six) hours as needed.      . ALBUTEROL SULFATE HFA 108 (90 BASE) MCG/ACT IN AERS Inhalation Inhale 2  puffs into the lungs every 4 (four) hours as needed. For shortness of breath. 6.7 g 4  . CYCLOBENZAPRINE HCL 10 MG PO TABS Oral Take 1 tablet (10 mg total) by mouth 2 (two) times daily as needed. For muscle pain. 30 tablet 0  . ETODOLAC 400 MG PO TABS Oral Take 1 tablet (400 mg total) by mouth 2 (two) times daily. 30 tablet 0  . FERROUS SULFATE 325 (65 FE) MG PO TABS Oral Take 1 tablet (325 mg total) by mouth daily. 30 tablet 3  . CETIRIZINE HCL 10 MG PO TABS Oral Take 1 tablet (10 mg total) by mouth daily.      BP 142/100  Pulse 84  Temp(Src) 98.2 F (36.8 C) (Oral)  SpO2 100%  Physical Exam  Nursing note and vitals reviewed. Constitutional: She is oriented to person, place, and time. She appears well-developed and well-nourished.  HENT:  Head: Normocephalic and atraumatic.  Neck: Neck supple.  Cardiovascular: Normal rate and regular rhythm.   Pulmonary/Chest: Effort normal and breath sounds normal. No respiratory distress. She has no wheezes. She has no rales.  Musculoskeletal:       Cervical back: She exhibits no tenderness and no bony tenderness.  Thoracic back: She exhibits no tenderness and no bony tenderness.       Lumbar back: She exhibits no tenderness and no bony tenderness.       Lower extremities: strength 5/5, sensation intact, distal pulses intact.  No edema, no erythema, no warmth.  Left calf mildly tender.     Neurological: She is alert and oriented to person, place, and time.    ED Course  Procedures (including critical care time)   Labs Reviewed  POCT I-STAT, CHEM 8   No results found.   1. Muscle spasms of lower extremity       MDM  Patient with muscles spasms in her legs only at night for years, worse over the past few weeks.  No concerning findings on exam.  Doubt radiculopathy, doubt DVT, no evidence of infection.  Electrolytes are normal.  Pt has PCP for follow up.  D/C home with valium.  Return precautions given.  Patient verbalizes  understanding and agrees with plan.          Dillard Cannon [place], [place] 09/16/11 [phone]

## 2011-09-15 NOTE — ED Notes (Signed)
Patient states she has leg spasms and when she lays down her legs cramp and spasm making it hard to rest. Has been drinking vinegar. Tried Flexeril last week; states Flexeril helped but made her sleepy for 2 days.

## 2011-09-16 LAB — POCT I-STAT, CHEM 8
BUN: 15 mg/dL (ref 6–23)
Calcium, Ion: 1.24 mmol/L (ref 1.12–1.32)
Chloride: 106 mEq/L (ref 96–112)
Glucose, Bld: 89 mg/dL (ref 70–99)
HCT: 43 % (ref 36.0–46.0)
Potassium: 4.3 mEq/L (ref 3.5–5.1)

## 2011-09-16 MED ORDER — IBUPROFEN 800 MG PO TABS
ORAL_TABLET | ORAL | Status: AC
  Administered 2011-09-16: 800 mg
  Filled 2011-09-16: qty 1

## 2011-09-16 MED ORDER — DIAZEPAM 5 MG PO TABS: 5.0000 mg | ORAL_TABLET | Freq: Every evening | ORAL | Status: AC | PRN

## 2011-09-16 NOTE — Discharge Instructions (Signed)
Read the information below.  Please follow up with your primary care providers.  If you develop a change in your symptoms including swelling or redness of your legs, fevers, or daytime/constant pain, return to the ER for a recheck.  You may return to the ER at any time for worsening condition or any new symptoms that concern you.   Muscle Cramps Muscle cramps are due to sudden involuntary muscle contraction. This means you have no control over the tightening of a muscle (or muscles). Often there are no obvious causes. Muscle cramps may occur with overexertion. They may also occur with chilling of the muscles. An example of a muscle chilling activity is swimming. It is uncommon for cramps to be due to a serious underlying disorder. In most cases, muscle cramps improve (or leave) within minutes. CAUSES  Some common causes are:  Injury.   Infections, especially viral.   Abnormal levels of the salts and ions in your blood (electrolytes). This could happen if you are taking water pills (diuretics).   Blood vessel disease where not enough blood is getting to the muscles (intermittent claudication).  Some uncommon causes are:  Side effects of some medicine (such as lithium).   Alcohol abuse.   Diseases where there is soreness (inflammation) of the muscular system.  HOME CARE INSTRUCTIONS   It may be helpful to massage, stretch, and relax the affected muscle.   Taking a dose of over-the-counter diphenhydramine is helpful for night leg cramps.  SEEK MEDICAL CARE IF:  Cramps are frequent and not relieved with medicine. MAKE SURE YOU:   Understand these instructions.   Will watch your condition.   Will get help right away if you are not doing well or get worse.  Document Released: 09/18/2001 Document Revised: 03/18/2011 Document Reviewed: 03/20/2008 [facility] Patient Information 2012 [place], [place].

## 2011-09-16 NOTE — ED Provider Notes (Signed)
Medical screening examination/treatment/procedure(s) were performed by non-physician practitioner and as supervising physician I was immediately available for consultation/collaboration.  Sunnie Nielsen, MD 09/16/11 [phone]

## 2011-11-06 ENCOUNTER — Encounter (HOSPITAL_COMMUNITY): Payer: Self-pay | Admitting: Emergency Medicine

## 2011-11-06 ENCOUNTER — Emergency Department (HOSPITAL_COMMUNITY)
Admission: EM | Admit: 2011-11-06 | Discharge: 2011-11-07 | Disposition: A | Discharge status: Home / Self Care | Payer: Self-pay | Attending: Emergency Medicine | Admitting: Emergency Medicine

## 2011-11-06 DIAGNOSIS — M25559 Pain in unspecified hip: Secondary | ICD-10-CM | POA: Insufficient documentation

## 2011-11-06 DIAGNOSIS — M79673 Pain in unspecified foot: Secondary | ICD-10-CM

## 2011-11-06 DIAGNOSIS — G8929 Other chronic pain: Secondary | ICD-10-CM | POA: Insufficient documentation

## 2011-11-06 DIAGNOSIS — M79609 Pain in unspecified limb: Secondary | ICD-10-CM | POA: Insufficient documentation

## 2011-11-06 NOTE — ED Notes (Addendum)
Patient with chronic hip pain.  Was seen at [facility] one month ago for same.  Patient states she was given sleeping meds for the hip pain.  Patient also complaining of cramps in legs everyday.  Patient states that she is having pain in right foot also.

## 2011-11-07 ENCOUNTER — Emergency Department (HOSPITAL_COMMUNITY): Payer: Self-pay

## 2011-11-07 MED ORDER — METHOCARBAMOL 500 MG PO TABS: 500.0000 mg | ORAL_TABLET | Freq: Two times a day (BID) | ORAL | Status: AC

## 2011-11-07 MED ORDER — TRAMADOL HCL 50 MG PO TABS: 50.0000 mg | ORAL_TABLET | Freq: Four times a day (QID) | ORAL | Status: AC | PRN

## 2011-11-07 NOTE — ED Notes (Signed)
Bridgette, PA at bedside. 

## 2011-11-07 NOTE — ED Notes (Signed)
Patient updated on plan of care; informed patient that we are currently waiting on orders from PA.  Patient has no other questions or concerns at this time; will continue to monitor.

## 2011-11-07 NOTE — ED Notes (Signed)
Patient currently resting quietly in bed; no respiratory or acute distress noted.  Patient updated on plan of care; informed patient that we are currently waiting on x-ray results to come back.  Patient has no other questions or concerns at this time.  Will continue to monitor.

## 2011-11-07 NOTE — ED Notes (Signed)
Pt. Verbalized understanding of medication teaching. Verbalized warning signs of worsening condition or infection.  Instructed to follow up with Orthopedic Dr listed in paperwork. Denies any further questions at this time. A.O. X4. Ambulatory. Respirations even and unlabored. Skin warm, dry, and intact. NAD.

## 2011-11-07 NOTE — ED Provider Notes (Signed)
Medical screening examination/treatment/procedure(s) were performed by non-physician practitioner and as supervising physician I was immediately available for consultation/collaboration.  Olivia Mackie, MD 11/07/11 [phone]

## 2011-11-07 NOTE — ED Notes (Addendum)
Patient complaining of chronic pain at multiple sites: hip pain, leg pain, and right foot pain.  Patient denies injury to any areas; reports being seen at [facility] about a month ago and was prescribed medication for his pain/to help her sleep.  Patient states that the medications are providing little to no relief.  Rates pain 10/10 on the numerical pain scale; describes pain as "aching" and "sharp".  Patient alert and oriented x4; PERRL present.  Will continue to monitor.

## 2011-11-07 NOTE — ED Provider Notes (Signed)
History     CSN: [id number]  Arrival date & time 11/06/11  2320   First MD Initiated Contact with Patient 11/07/11 0152     1:51 PM HPI Patient reports chronic right hip pain for several years DG chest disease. Reports pain not worse and no better. She is just tired of hurting. Also reports that she has right foot pain and feels that she may have stepped on something. States however that her hip is hurting her too bad and she is unable to bend over and take a look. Denies numbness, tingling, weakness, back pain, saddle anesthesias, perineal numbness, skin  Discoloration.  The history is provided by the patient.    Past Medical History  Diagnosis Date  . Lower back pain   . Asthma   . Adnexal cyst 2011    removed  . Hip pain, right     Past Surgical History  Procedure Date  . Tubal ligation   . Breast reduction surgery     No family history on file.  History  Substance Use Topics  . Smoking status: Never Smoker   . Smokeless tobacco: Not on file  . Alcohol Use: No    OB History    Grav Para Term Preterm Abortions TAB SAB Ect Mult Living                  Review of Systems  Constitutional: Negative for fever and chills.  HENT: Negative for neck pain.   Musculoskeletal: Negative for back pain.       Foot pain and hip pain  Skin: Negative for wound.  Neurological: Negative for weakness and numbness.  All other systems reviewed and are negative.    Allergies  Primatene mist and Bactrim ds  Home Medications   Current Outpatient Rx  Name Route Sig Dispense Refill  . ACETAMINOPHEN 325 MG PO TABS Oral Take 325 mg by mouth every 6 (six) hours as needed. For pain    . BAYER ASPIRIN PO Oral Take 1 tablet by mouth daily as needed. For pain    . FERROUS SULFATE 325 (65 FE) MG PO TABS Oral Take 1 tablet (325 mg total) by mouth daily. 30 tablet 3  . POTASSIUM CHLORIDE PO Oral Take 1 tablet by mouth daily. OTC      BP 127/86  Pulse 88  Temp 98 F (36.7 C) (Oral)   Resp 14  SpO2 96%  LMP 10/14/2011  Physical Exam  Vitals reviewed. Constitutional: She is oriented to person, place, and time. Vital signs are normal. She appears well-developed and well-nourished. No distress.  HENT:  Head: Normocephalic and atraumatic.  Eyes: Pupils are equal, round, and reactive to light.  Neck: Neck supple.  Pulmonary/Chest: Effort normal.  Musculoskeletal:       Patient has full range of motion of right hip. Tender to palpation on the lateral portion of hip. Distal pulses are normal. Normal sensation. Patient has tenderness of plantar foot under second MTP. Patient also has a large callus under this area and no foreign bodies seen or palpated  Neurological: She is alert and oriented to person, place, and time.  Skin: Skin is warm and dry. No rash noted. No erythema. No pallor.  Psychiatric: She has a normal mood and affect. Her behavior is normal.    ED Course  Procedures   Dg Foot Complete Right  11/07/2011  *RADIOLOGY REPORT*  Clinical Data: Right foot pain.  Evaluate for foreign body.  RIGHT FOOT COMPLETE -  3+ VIEW  Comparison: None.  Findings: No acute fracture or dislocation identified.  There is nonspecific periosteal thickening along the metatarsals, most pronounced involving the second digit. No radiopaque foreign body identified.  IMPRESSION: Nonspecific periosteal thickening along the metatarsals, most pronounced involving the second digit.  Differential includes however is not limited to prior stress fracture, venous stasis, hypertrophic osteoarthropathy, and melorheostosis. Consider MR follow-up.  No radiopaque foreign body identified.  Original Report Authenticated By: Waneta Martins, M.D.     MDM   Patient is insistent that there is a foreign body in her foot therefore ordered foot x-ray. X-ray was negative for foreign body. Explained to patient that she should followup with orthopedic physician for chronic hip pain and foot pain. Will provide  patient with stroke prescription for analgesics since Tylenol as a logger working. I advised patient to only use this for breakthrough pain. Patient voices understanding and is ready for discharge       Thomasene Lot, Cordelia Poche 11/07/11 4098

## 2012-02-13 ENCOUNTER — Encounter (HOSPITAL_COMMUNITY): Payer: Self-pay | Admitting: Emergency Medicine

## 2012-02-13 ENCOUNTER — Emergency Department (HOSPITAL_COMMUNITY)
Admission: EM | Admit: 2012-02-13 | Discharge: 2012-02-13 | Disposition: A | Discharge status: Home / Self Care | Payer: Self-pay | Attending: Emergency Medicine | Admitting: Emergency Medicine

## 2012-02-13 DIAGNOSIS — N939 Abnormal uterine and vaginal bleeding, unspecified: Secondary | ICD-10-CM

## 2012-02-13 DIAGNOSIS — J45909 Unspecified asthma, uncomplicated: Secondary | ICD-10-CM | POA: Insufficient documentation

## 2012-02-13 DIAGNOSIS — R109 Unspecified abdominal pain: Secondary | ICD-10-CM | POA: Insufficient documentation

## 2012-02-13 DIAGNOSIS — N938 Other specified abnormal uterine and vaginal bleeding: Secondary | ICD-10-CM | POA: Insufficient documentation

## 2012-02-13 DIAGNOSIS — Z9851 Tubal ligation status: Secondary | ICD-10-CM | POA: Insufficient documentation

## 2012-02-13 DIAGNOSIS — Z3202 Encounter for pregnancy test, result negative: Secondary | ICD-10-CM | POA: Insufficient documentation

## 2012-02-13 DIAGNOSIS — N949 Unspecified condition associated with female genital organs and menstrual cycle: Secondary | ICD-10-CM | POA: Insufficient documentation

## 2012-02-13 DIAGNOSIS — M549 Dorsalgia, unspecified: Secondary | ICD-10-CM | POA: Insufficient documentation

## 2012-02-13 DIAGNOSIS — Z79899 Other long term (current) drug therapy: Secondary | ICD-10-CM | POA: Insufficient documentation

## 2012-02-13 LAB — URINALYSIS, MICROSCOPIC ONLY
Bilirubin Urine: NEGATIVE
Ketones, ur: NEGATIVE mg/dL
Nitrite: NEGATIVE
Specific Gravity, Urine: 1.029 (ref 1.005–1.030)
pH: 5.5 (ref 5.0–8.0)

## 2012-02-13 LAB — CBC WITH DIFFERENTIAL/PLATELET
Basophils Absolute: 0 10*3/uL (ref 0.0–0.1)
Eosinophils Relative: 0 % (ref 0–5)
HCT: 42.8 % (ref 36.0–46.0)
Hemoglobin: 14.2 g/dL (ref 12.0–15.0)
Lymphocytes Relative: 17 % (ref 12–46)
MCHC: 33.2 g/dL (ref 30.0–36.0)
MCV: 86.3 fL (ref 78.0–100.0)
Monocytes Absolute: 0.4 10*3/uL (ref 0.1–1.0)
Monocytes Relative: 3 % (ref 3–12)
Neutro Abs: 9.1 10*3/uL — ABNORMAL HIGH (ref 1.7–7.7)
RDW: 13.5 % (ref 11.5–15.5)

## 2012-02-13 LAB — COMPREHENSIVE METABOLIC PANEL
ALT: 10 U/L (ref 0–35)
Albumin: 3.8 g/dL (ref 3.5–5.2)
Alkaline Phosphatase: 61 U/L (ref 39–117)
Calcium: 9.2 mg/dL (ref 8.4–10.5)
Potassium: 5.3 mEq/L — ABNORMAL HIGH (ref 3.5–5.1)
Sodium: 137 mEq/L (ref 135–145)
Total Protein: 8.5 g/dL — ABNORMAL HIGH (ref 6.0–8.3)

## 2012-02-13 LAB — PREGNANCY, URINE: Preg Test, Ur: NEGATIVE

## 2012-02-13 LAB — WET PREP, GENITAL
Trich, Wet Prep: NONE SEEN
Yeast Wet Prep HPF POC: NONE SEEN

## 2012-02-13 MED ORDER — HYDROCODONE-ACETAMINOPHEN 5-500 MG PO TABS: 1.0000 | ORAL_TABLET | Freq: Four times a day (QID) | ORAL | Status: DC | PRN

## 2012-02-13 MED ORDER — ONDANSETRON HCL 4 MG/2ML IJ SOLN: 4.0000 mg | Freq: Once | INTRAMUSCULAR | Status: DC

## 2012-02-13 MED ORDER — SODIUM CHLORIDE 0.9 % IV SOLN: INTRAVENOUS | Status: DC | PRN

## 2012-02-13 MED ORDER — HYDROCODONE-ACETAMINOPHEN 5-325 MG PO TABS
2.0000 | ORAL_TABLET | Freq: Once | ORAL | Status: AC
  Administered 2012-02-13: 2 via ORAL
  Filled 2012-02-13: qty 2

## 2012-02-13 MED ORDER — ONDANSETRON 8 MG PO TBDP
8.0000 mg | ORAL_TABLET | Freq: Once | ORAL | Status: AC
  Administered 2012-02-13: 8 mg via ORAL
  Filled 2012-02-13: qty 1

## 2012-02-13 NOTE — ED Notes (Signed)
MD gave verbal order for In and Out at 0925.

## 2012-02-13 NOTE — ED Provider Notes (Signed)
History     CSN: [id number]  Arrival date & time 02/13/12  4098   First MD Initiated Contact with Patient 02/13/12 [phone]      Chief Complaint  Patient presents with  . Abdominal Pain  . Back Pain  . Vaginal Bleeding    (Consider location/radiation/quality/duration/timing/severity/associated sxs/prior treatment) Patient is a 46 y.o. female presenting with abdominal pain, back pain, and vaginal bleeding. The history is provided by the patient.  Abdominal Pain The primary symptoms of the illness include abdominal pain and vaginal bleeding. The primary symptoms of the illness do not include fever, shortness of breath, diarrhea, dysuria or vaginal discharge.  Additional symptoms associated with the illness include back pain. Symptoms associated with the illness do not include chills or constipation.  Back Pain  Associated symptoms include abdominal pain. Pertinent negatives include no chest pain, no fever, no headaches and no dysuria.  Vaginal Bleeding Associated symptoms include abdominal pain. Pertinent negatives include no chest pain, no headaches and no shortness of breath.  pt c/o bil lower abd/pelvic pain, low back pain, and vaginal bleeding. Onset in past day. States lnmp approximately 2 months ago. Pain dull, cramping, moderate, non radiating, no specific exacerbating or alleviating factors. No radicular pain or leg pain. No numbness/weakness. Nausea. No vomiting. Having normal bms, no diarrhea or constipation. No abd distension. Vaginal bleeding c/w prior period, has used 3 pads in prior 6-7 hours. No vaginal discharge. Prior abd surgery includes tubal ligation, and removal right ovarian cyst/tube. Denies hx endometriosis or uterine fibroids. Denies fever or chills. No dysuria, frequency or urgency. States typically does not get a lot of pain w periods although rarely has had similar pain/cramping during period.     Past Medical History  Diagnosis Date  . Lower back pain   . Asthma    . Adnexal cyst 2011    removed  . Hip pain, right     Past Surgical History  Procedure Date  . Tubal ligation   . Breast reduction surgery     No family history on file.  History  Substance Use Topics  . Smoking status: Never Smoker   . Smokeless tobacco: Not on file  . Alcohol Use: No    OB History    Grav Para Term Preterm Abortions TAB SAB Ect Mult Living                  Review of Systems  Constitutional: Negative for fever and chills.  HENT: Negative for neck pain.   Eyes: Negative for redness.  Respiratory: Negative for shortness of breath.   Cardiovascular: Negative for chest pain.  Gastrointestinal: Positive for abdominal pain. Negative for diarrhea and constipation.  Genitourinary: Positive for vaginal bleeding. Negative for dysuria, flank pain and vaginal discharge.  Musculoskeletal: Positive for back pain.  Skin: Negative for rash.  Neurological: Negative for headaches.  Hematological: Does not bruise/bleed easily.  Psychiatric/Behavioral: Negative for confusion.    Allergies  Primatene mist and Bactrim ds  Home Medications   Current Outpatient Rx  Name  Route  Sig  Dispense  Refill  . ACETAMINOPHEN 500 MG PO TABS   Oral   Take 500 mg by mouth every 6 (six) hours as needed. For pain         . FERROUS GLUCONATE 325 MG PO TABS   Oral   Take 325 mg by mouth daily with breakfast.         . METHOCARBAMOL 500 MG PO TABS  Oral   Take 500 mg by mouth 2 (two) times daily.         Marland Kitchen POTASSIUM 95 MG PO TABS   Oral   Take 1 tablet by mouth daily.         . TRAMADOL HCL 50 MG PO TABS   Oral   Take 50 mg by mouth every 6 (six) hours as needed.         Marland Kitchen POTASSIUM CHLORIDE PO   Oral   Take 1 tablet by mouth daily. OTC           BP 135/81  Pulse 79  Temp 97.7 F (36.5 C) (Oral)  Resp 18  Ht 5\' 2"  (1.575 m)  Wt 235 lb (106.595 kg)  BMI 42.98 kg/m2  SpO2 100%  LMP 02/09/2012  Physical Exam  Nursing note and vitals  reviewed. Constitutional: She appears well-developed and well-nourished. No distress.  Eyes: Conjunctivae normal are normal. No scleral icterus.  Neck: Neck supple. No tracheal deviation present.  Cardiovascular: Normal rate, regular rhythm, normal heart sounds and intact distal pulses.   Pulmonary/Chest: Effort normal and breath sounds normal. No respiratory distress.  Abdominal: Soft. Normal appearance and bowel sounds are normal. She exhibits no distension and no mass. There is no tenderness. There is no rebound and no guarding.  Genitourinary:       No cva tenderness. Small amount dark blood in vagina, no active bleeding from cervix. Cervix closed. No cmt. No discharge. No cmt. No adx masses/focal tenderness  Musculoskeletal: She exhibits no edema.  Neurological: She is alert.  Skin: Skin is warm and dry. No rash noted.  Psychiatric: She has a normal mood and affect.    ED Course  Procedures (including critical care time)   Labs Reviewed  COMPREHENSIVE METABOLIC PANEL  CBC WITH DIFFERENTIAL  URINALYSIS, MICROSCOPIC ONLY  URINALYSIS, ROUTINE W REFLEX MICROSCOPIC  PREGNANCY, URINE    Results for orders placed during the hospital encounter of 02/13/12  COMPREHENSIVE METABOLIC PANEL      Component Value Range   Sodium 137  135 - 145 mEq/L   Potassium 5.3 (*) 3.5 - 5.1 mEq/L   Chloride 103  96 - 112 mEq/L   CO2 26  19 - 32 mEq/L   Glucose, Bld 88  70 - 99 mg/dL   BUN 14  6 - 23 mg/dL   Creatinine, Ser 1.61  0.50 - 1.10 mg/dL   Calcium 9.2  8.4 - 09.6 mg/dL   Total Protein 8.5 (*) 6.0 - 8.3 g/dL   Albumin 3.8  3.5 - 5.2 g/dL   AST 22  0 - 37 U/L   ALT 10  0 - 35 U/L   Alkaline Phosphatase 61  39 - 117 U/L   Total Bilirubin 0.2 (*) 0.3 - 1.2 mg/dL   GFR calc non Af Amer 85 (*) >90 mL/min   GFR calc Af Amer >90  >90 mL/min  CBC WITH DIFFERENTIAL      Component Value Range   WBC 11.4 (*) 4.0 - 10.5 K/uL   RBC 4.96  3.87 - 5.11 MIL/uL   Hemoglobin 14.2  12.0 - 15.0 g/dL    HCT 04.5  40.9 - 81.1 %   MCV 86.3  78.0 - 100.0 fL   MCH 28.6  26.0 - 34.0 pg   MCHC 33.2  30.0 - 36.0 g/dL   RDW 91.4  78.2 - 95.6 %   Platelets 305  150 - 400 K/uL  Neutrophils Relative 79 (*) 43 - 77 %   Neutro Abs 9.1 (*) 1.7 - 7.7 K/uL   Lymphocytes Relative 17  12 - 46 %   Lymphs Abs 1.9  0.7 - 4.0 K/uL   Monocytes Relative 3  3 - 12 %   Monocytes Absolute 0.4  0.1 - 1.0 K/uL   Eosinophils Relative 0  0 - 5 %   Eosinophils Absolute 0.0  0.0 - 0.7 K/uL   Basophils Relative 0  0 - 1 %   Basophils Absolute 0.0  0.0 - 0.1 K/uL  URINALYSIS, MICROSCOPIC ONLY      Component Value Range   Color, Urine YELLOW  YELLOW   APPearance CLOUDY (*) CLEAR   Specific Gravity, Urine 1.029  1.005 - 1.030   pH 5.5  5.0 - 8.0   Glucose, UA NEGATIVE  NEGATIVE mg/dL   Hgb urine dipstick SMALL (*) NEGATIVE   Bilirubin Urine NEGATIVE  NEGATIVE   Ketones, ur NEGATIVE  NEGATIVE mg/dL   Protein, ur NEGATIVE  NEGATIVE mg/dL   Urobilinogen, UA 0.2  0.0 - 1.0 mg/dL   Nitrite NEGATIVE  NEGATIVE   Leukocytes, UA NEGATIVE  NEGATIVE   RBC / HPF 0-2  <3 RBC/hpf   Bacteria, UA RARE  RARE   Squamous Epithelial / LPF FEW (*) RARE   Urine-Other MUCOUS PRESENT    PREGNANCY, URINE      Component Value Range   Preg Test, Ur NEGATIVE  NEGATIVE  WET PREP, GENITAL      Component Value Range   Yeast Wet Prep HPF POC NONE SEEN  NONE SEEN   Trich, Wet Prep NONE SEEN  NONE SEEN   Clue Cells Wet Prep HPF POC NONE SEEN  NONE SEEN   WBC, Wet Prep HPF POC NONE SEEN  NONE SEEN      MDM  Labs.   vicodin po for pain.   Recheck pt comfortable. abd soft nt. Afeb. Vitals normal. u preg neg.  Benign abd exam. Pt appears stable for d/c.          Suzi Roots, MD 02/13/12 [phone]

## 2012-02-13 NOTE — ED Notes (Signed)
Pt states that she started her period 4 days ago and three days ago she began having low abd and low back pain.  States she has thrown up once from the pain and that her periods are never painful.  States that this period is way above and beyond how much she normally bleeds.  Pt states that she has already saturated 3 overnight pads in 6 hours.  Pain 7/10.

## 2012-08-01 ENCOUNTER — Other Ambulatory Visit: Payer: Self-pay | Admitting: Family Medicine

## 2012-08-01 DIAGNOSIS — Z1231 Encounter for screening mammogram for malignant neoplasm of breast: Secondary | ICD-10-CM

## 2012-08-22 ENCOUNTER — Ambulatory Visit (INDEPENDENT_AMBULATORY_CARE_PROVIDER_SITE_OTHER): Payer: BC Managed Care – PPO | Admitting: Neurology

## 2012-08-22 ENCOUNTER — Ambulatory Visit (INDEPENDENT_AMBULATORY_CARE_PROVIDER_SITE_OTHER): Payer: BC Managed Care – PPO

## 2012-08-22 DIAGNOSIS — G5602 Carpal tunnel syndrome, left upper limb: Secondary | ICD-10-CM

## 2012-08-22 DIAGNOSIS — Z0289 Encounter for other administrative examinations: Secondary | ICD-10-CM

## 2012-08-22 DIAGNOSIS — G5600 Carpal tunnel syndrome, unspecified upper limb: Secondary | ICD-10-CM

## 2012-08-22 NOTE — Procedures (Signed)
  HISTORY:  Brandi Chase is a 47 year old patient with a six-week history of numbness affecting both hands, left greater than right. The patient has intermittent symptoms on the right arm, more persistent symptoms on the left. The patient will have numbness that is worse in the evening hours. The patient denies any neck discomfort or pain radiating down the arms from the neck.  NERVE CONDUCTION STUDIES:  Nerve conduction studies were performed on both upper extremities. The distal motor latencies for the median nerves were prolonged on the left, normal on the right, and normal for the ulnar nerves bilaterally. The motor amplitudes for the median and ulnar nerves were normal bilaterally. The F wave latencies and nerve conduction velocities for the median and ulnar nerves were normal bilaterally. The sensory latencies for the median nerves were prolonged on the left, normal on the right, and normal for the ulnar nerves bilaterally.  EMG STUDIES:  EMG study was performed on the right upper extremity:  The first dorsal interosseous muscle reveals 2 to 4 K units with full recruitment. No fibrillations or positive waves were noted. The abductor pollicis brevis muscle reveals 2 to 4 K units with full recruitment. No fibrillations or positive waves were noted.  The patient refused further study of the right upper extremity and of the left upper extremity.   IMPRESSION:  Nerve conduction studies done on both upper extremities shows evidence of a mild left carpal tunnel syndrome. No abnormalities were seen on the right upper extremity. A limited EMG evaluation of the right extremity was unremarkable. The patient refused further study of the right arm, and of the left upper extremity.  Marlan Palau MD 08/22/2012 10:47 AM  [facility] [address] [place], [place] [address]  Phone [phone] Fax [phone]

## 2012-08-31 ENCOUNTER — Ambulatory Visit
Admission: RE | Admit: 2012-08-31 | Discharge: 2012-08-31 | Disposition: A | Discharge status: Home / Self Care | Payer: BC Managed Care – PPO | Source: Ambulatory Visit | Attending: Family Medicine | Admitting: Family Medicine

## 2012-08-31 DIAGNOSIS — Z1231 Encounter for screening mammogram for malignant neoplasm of breast: Secondary | ICD-10-CM

## 2012-10-01 ENCOUNTER — Encounter (HOSPITAL_COMMUNITY): Payer: Self-pay | Admitting: Adult Health

## 2012-10-01 DIAGNOSIS — M545 Low back pain, unspecified: Secondary | ICD-10-CM | POA: Insufficient documentation

## 2012-10-01 DIAGNOSIS — R209 Unspecified disturbances of skin sensation: Secondary | ICD-10-CM | POA: Insufficient documentation

## 2012-10-01 DIAGNOSIS — M25559 Pain in unspecified hip: Secondary | ICD-10-CM | POA: Insufficient documentation

## 2012-10-01 DIAGNOSIS — M5430 Sciatica, unspecified side: Secondary | ICD-10-CM | POA: Insufficient documentation

## 2012-10-01 DIAGNOSIS — Z91040 Latex allergy status: Secondary | ICD-10-CM | POA: Insufficient documentation

## 2012-10-01 DIAGNOSIS — Z8742 Personal history of other diseases of the female genital tract: Secondary | ICD-10-CM | POA: Insufficient documentation

## 2012-10-01 DIAGNOSIS — J45909 Unspecified asthma, uncomplicated: Secondary | ICD-10-CM | POA: Insufficient documentation

## 2012-10-01 NOTE — ED Notes (Signed)
Presents with right hip pain that began 9 weeks ago is worse in the afternoons and when lying down. 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 Takes tramadol at home with no relief. Cms intact

## 2012-10-02 ENCOUNTER — Emergency Department (HOSPITAL_COMMUNITY)
Admission: EM | Admit: 2012-10-02 | Discharge: 2012-10-02 | Disposition: A | Discharge status: Home / Self Care | Payer: BC Managed Care – PPO | Attending: Emergency Medicine | Admitting: Emergency Medicine

## 2012-10-02 DIAGNOSIS — M5431 Sciatica, right side: Secondary | ICD-10-CM

## 2012-10-02 MED ORDER — TRAMADOL HCL 50 MG PO TABS: 50.0000 mg | ORAL_TABLET | Freq: Four times a day (QID) | ORAL | Status: DC | PRN

## 2012-10-02 MED ORDER — CYCLOBENZAPRINE HCL 10 MG PO TABS: 10.0000 mg | ORAL_TABLET | Freq: Three times a day (TID) | ORAL | Status: DC | PRN

## 2012-10-02 MED ORDER — PREDNISONE 20 MG PO TABS: 20.0000 mg | ORAL_TABLET | Freq: Every day | ORAL | Status: DC

## 2012-10-02 NOTE — ED Provider Notes (Signed)
History     CSN: [id number]  Arrival date & time 10/01/12  2229   First MD Initiated Contact with Patient 10/02/12 0034      Chief Complaint  Patient presents with  . Leg Pain   HPI  History provided by the patient. The patient is a 47 year old female with history of lower back pain and right hip pain who presents with complaints of worsened right lower extremity pains. Patient states that she has been following up with her doctor for the past many weeks to months due to pains in her low back and right lower extremity. He states pain seem worse in certain positions especially while lying flat. This occasionally causes some tingling and numbness all the way down her leg to the lateral aspect. She denies any numbness or tingling to the medial part of the thighs or perineal area. She has been taking Tylenol for her symptoms. She also states that previously many weeks ago she was on Flexeril and tramadol which seemed to work very well. Patient also states that she had been MRI performed about 3 weeks ago and states that it was not changed from a previous MRI many years ago and showed some bulging of the discs in her low back. She denies having any associated urinary or fecal incontinence, urinary retention or perineal numbness. There has been no recent fever, chills or sweats. No other aggravating or alleviating factors. No other associated symptoms.    Past Medical History  Diagnosis Date  . Lower back pain   . Asthma   . Adnexal cyst 2011    removed  . Hip pain, right     Past Surgical History  Procedure Laterality Date  . Tubal ligation    . Breast reduction surgery      History reviewed. No pertinent family history.  History  Substance Use Topics  . Smoking status: Never Smoker   . Smokeless tobacco: Not on file  . Alcohol Use: No    OB History   Grav Para Term Preterm Abortions TAB SAB Ect Mult Living                  Review of Systems  Constitutional: Negative for  fever, chills and diaphoresis.  Respiratory: Negative for shortness of breath.   Cardiovascular: Negative for chest pain.  Musculoskeletal: Positive for back pain.  Neurological: Positive for numbness. Negative for weakness.  All other systems reviewed and are negative.    Allergies  Other; Primatene mist; Aleve; Bactrim ds; Cocoa butter; Latex; and Avocado  Home Medications   Current Outpatient Rx  Name  Route  Sig  Dispense  Refill  . acetaminophen (TYLENOL) 500 MG tablet   Oral   Take 500 mg by mouth every other day. At bedtime alternating with ibuprofen         . Calcium Carbonate (CALTRATE 600 PO)   Oral   Take 600 mg by mouth at bedtime.         . Cholecalciferol (VITAMIN D) 2000 UNITS tablet   Oral   Take 2,000 Units by mouth at bedtime.         . ferrous sulfate 325 (65 FE) MG tablet   Oral   Take 325 mg by mouth at bedtime.         Marland Kitchen ibuprofen (ADVIL,MOTRIN) 200 MG tablet   Oral   Take 200 mg by mouth every other day. At bedtime (alternating with acetaminophen)         .  magnesium oxide (MAG-OX) 400 MG tablet   Oral   Take 400 mg by mouth at bedtime.         Marland Kitchen POTASSIUM PO   Oral   Take 1 tablet by mouth at bedtime. Over the counter product (does not know strength           BP 123/81  Pulse 81  Temp(Src) 97.7 F (36.5 C) (Oral)  Resp 18  SpO2 95%  Physical Exam  Nursing note and vitals reviewed. Constitutional: She is oriented to person, place, and time. She appears well-developed and well-nourished. No distress.  HENT:  Head: Normocephalic.  Cardiovascular: Normal rate and regular rhythm.   Pulmonary/Chest: Effort normal and breath sounds normal. No respiratory distress. She has no wheezes.  Abdominal: Soft.  Musculoskeletal:       Lumbar back: She exhibits tenderness. She exhibits no bony tenderness, no edema and no deformity.       Back:  Patient is obese. There is some tenderness in the right lower paraspinous and pelvic  area. No gross deformities. Patient has no swelling of the lower extremities. There is no tenderness to palpation. Full range of motion in all joints. Normal dorsal pedal pulses. Skin color normal.  Neurological: She is alert and oriented to person, place, and time. She has normal strength. No sensory deficit.  Skin: Skin is warm and dry. No rash noted.  Psychiatric: She has a normal mood and affect. Her behavior is normal.    ED Course  Procedures       1. Sciatica, right       MDM  Patient seen and evaluated. Patient appears well but uncomfortable. No acute distress. No red flag symptoms for her back pain and right lower leg pain.  MRI 3 wks ago similar bulging discs. She states her doctor wants to send her to a specialist.  Patient did drive herself to the emergency room. This time we'll plan to treat with prescriptions for symptoms. She agrees to this plan.  Angus Seller, PA-C 10/02/12 [phone]

## 2012-10-02 NOTE — ED Provider Notes (Signed)
  Medical screening examination/treatment/procedure(s) were performed by non-physician practitioner and as supervising physician I was immediately available for consultation/collaboration.    Donyel Castagnola, MD 10/02/12 0609 

## 2013-01-03 ENCOUNTER — Encounter (HOSPITAL_COMMUNITY): Payer: Self-pay | Admitting: Emergency Medicine

## 2013-01-03 ENCOUNTER — Emergency Department (HOSPITAL_COMMUNITY)
Admission: EM | Admit: 2013-01-03 | Discharge: 2013-01-03 | Disposition: A | Discharge status: Home / Self Care | Payer: BC Managed Care – PPO | Attending: Emergency Medicine | Admitting: Emergency Medicine

## 2013-01-03 DIAGNOSIS — Z8742 Personal history of other diseases of the female genital tract: Secondary | ICD-10-CM | POA: Insufficient documentation

## 2013-01-03 DIAGNOSIS — G8929 Other chronic pain: Secondary | ICD-10-CM | POA: Insufficient documentation

## 2013-01-03 DIAGNOSIS — Z91040 Latex allergy status: Secondary | ICD-10-CM | POA: Insufficient documentation

## 2013-01-03 DIAGNOSIS — M2550 Pain in unspecified joint: Secondary | ICD-10-CM | POA: Insufficient documentation

## 2013-01-03 DIAGNOSIS — R42 Dizziness and giddiness: Secondary | ICD-10-CM

## 2013-01-03 DIAGNOSIS — Z79899 Other long term (current) drug therapy: Secondary | ICD-10-CM | POA: Insufficient documentation

## 2013-01-03 DIAGNOSIS — J45909 Unspecified asthma, uncomplicated: Secondary | ICD-10-CM | POA: Insufficient documentation

## 2013-01-03 LAB — URINALYSIS, ROUTINE W REFLEX MICROSCOPIC
Bilirubin Urine: NEGATIVE
Glucose, UA: NEGATIVE mg/dL
Specific Gravity, Urine: 1.023 (ref 1.005–1.030)
Urobilinogen, UA: 0.2 mg/dL (ref 0.0–1.0)
pH: 6 (ref 5.0–8.0)

## 2013-01-03 LAB — URINE MICROSCOPIC-ADD ON

## 2013-01-03 MED ORDER — DIAZEPAM 5 MG PO TABS: 5.0000 mg | ORAL_TABLET | Freq: Four times a day (QID) | ORAL | Status: DC | PRN

## 2013-01-03 NOTE — ED Notes (Signed)
Pt given ginger ale for fluid challenge, per her request.

## 2013-01-03 NOTE — ED Provider Notes (Signed)
CSN: [id number]     Arrival date & time 01/03/13  1153 History   First MD Initiated Contact with Patient 01/03/13 1218     Chief Complaint  Patient presents with  . Dizziness   (Consider location/radiation/quality/duration/timing/severity/associated sxs/prior Treatment) HPI Comments: 47 yo female with asthma presents with recurrent vertigo for 2 wks.  No acute changes.  Worse with movement.  No new HA, neck pain or stroke hx. NO walking difficulties.  Lasts short time period then resolved. Followed by pcp.  Pt has not tried medicines. Last time she has this she had uti.  The history is provided by the patient.    Past Medical History  Diagnosis Date  . Lower back pain   . Asthma   . Adnexal cyst 2011    removed  . Hip pain, right    Past Surgical History  Procedure Laterality Date  . Tubal ligation    . Breast reduction surgery     No family history on file. History  Substance Use Topics  . Smoking status: Never Smoker   . Smokeless tobacco: Not on file  . Alcohol Use: No   OB History   Grav Para Term Preterm Abortions TAB SAB Ect Mult Living                 Review of Systems  Constitutional: Negative for fever and chills.  HENT: Negative for neck pain and neck stiffness.   Eyes: Negative for visual disturbance.  Respiratory: Negative for shortness of breath.   Cardiovascular: Negative for chest pain.  Gastrointestinal: Negative for vomiting and abdominal pain.  Genitourinary: Negative for dysuria and flank pain.  Musculoskeletal: Positive for arthralgias (chronic). Negative for back pain.  Skin: Negative for rash.  Neurological: Positive for dizziness. Negative for light-headedness and headaches.    Allergies  Other; Primatene mist; Aleve; Bactrim ds; Cocoa butter; Latex; and Avocado  Home Medications   Current Outpatient Rx  Name  Route  Sig  Dispense  Refill  . Calcium Carbonate (CALTRATE 600 PO)   Oral   Take 600 mg by mouth at bedtime.         .  Cholecalciferol (VITAMIN D) 2000 UNITS tablet   Oral   Take 2,000 Units by mouth at bedtime.         . ferrous sulfate 325 (65 FE) MG tablet   Oral   Take 325 mg by mouth at bedtime.         Marland Kitchen ibuprofen (ADVIL,MOTRIN) 200 MG tablet   Oral   Take 600 mg by mouth every other day. At bedtime (alternating with acetaminophen)         . magnesium oxide (MAG-OX) 400 MG tablet   Oral   Take 400 mg by mouth at bedtime.         Marland Kitchen POTASSIUM PO   Oral   Take 1 tablet by mouth at bedtime. Over the counter product (does not know strength         . traMADol (ULTRAM) 50 MG tablet   Oral   Take 1 tablet (50 mg total) by mouth every 6 (six) hours as needed for pain.   30 tablet   0    BP 127/62  Pulse 80  Temp(Src) 98.3 F (36.8 C) (Oral)  Resp 18  SpO2 100%  LMP 12/13/2012 Physical Exam  Nursing note and vitals reviewed. Constitutional: She is oriented to person, place, and time. She appears well-developed and well-nourished.  HENT:  Head:  Normocephalic and atraumatic.  Eyes: Conjunctivae are normal. Right eye exhibits no discharge. Left eye exhibits no discharge.  Neck: Normal range of motion. Neck supple. No tracheal deviation present.  Cardiovascular: Normal rate and regular rhythm.   Pulmonary/Chest: Effort normal and breath sounds normal.  Abdominal: Soft. She exhibits no distension. There is no tenderness. There is no guarding.  Musculoskeletal: She exhibits no edema.  Neurological: She is alert and oriented to person, place, and time. Coordination and gait normal.  5+ strength in UE and LE with f/e at major joints. Sensation to palpation intact in UE and LE. CNs 2-12 grossly intact.  EOMFI.  PERRL.   Finger nose and coordination intact bilateral.   Visual fields intact to finger testing.   Skin: Skin is warm. No rash noted.  Psychiatric: She has a normal mood and affect.    ED Course  Procedures (including critical care time) Labs Review Labs Reviewed   URINALYSIS, ROUTINE W REFLEX MICROSCOPIC - Abnormal; Notable for the following:    APPearance CLOUDY (*)    Hgb urine dipstick TRACE (*)    All other components within normal limits  URINE MICROSCOPIC-ADD ON - Abnormal; Notable for the following:    Squamous Epithelial / LPF FEW (*)    All other components within normal limits   Imaging Review No results found.  MDM  No diagnosis found. Well appearing.  Likely periph vertigo.  Fluids and meclizine. UA.  Normal neuro exam.  UA unremarkable. Close fup discussed.   DC   Enid Skeens, MD 01/03/13 1730

## 2013-01-03 NOTE — ED Notes (Signed)
Per pt, states the same symptoms occurred in the past when she had UTI-was not checked by PCP

## 2013-01-03 NOTE — ED Notes (Signed)
Per pt, was diagnosed by PCP with vertigo last week-no meds given

## 2013-01-04 ENCOUNTER — Encounter (HOSPITAL_COMMUNITY): Payer: Self-pay | Admitting: *Deleted

## 2013-01-04 ENCOUNTER — Emergency Department (HOSPITAL_COMMUNITY)
Admission: EM | Admit: 2013-01-04 | Discharge: 2013-01-05 | Disposition: A | Discharge status: Home / Self Care | Payer: BC Managed Care – PPO | Attending: Emergency Medicine | Admitting: Emergency Medicine

## 2013-01-04 DIAGNOSIS — R42 Dizziness and giddiness: Secondary | ICD-10-CM

## 2013-01-04 DIAGNOSIS — Z91040 Latex allergy status: Secondary | ICD-10-CM | POA: Insufficient documentation

## 2013-01-04 DIAGNOSIS — J45909 Unspecified asthma, uncomplicated: Secondary | ICD-10-CM | POA: Insufficient documentation

## 2013-01-04 DIAGNOSIS — M549 Dorsalgia, unspecified: Secondary | ICD-10-CM | POA: Insufficient documentation

## 2013-01-04 DIAGNOSIS — R110 Nausea: Secondary | ICD-10-CM

## 2013-01-04 DIAGNOSIS — Z791 Long term (current) use of non-steroidal anti-inflammatories (NSAID): Secondary | ICD-10-CM | POA: Insufficient documentation

## 2013-01-04 LAB — POCT I-STAT, CHEM 8
Chloride: 103 mEq/L (ref 96–112)
Glucose, Bld: 125 mg/dL — ABNORMAL HIGH (ref 70–99)
HCT: 43 % (ref 36.0–46.0)
Hemoglobin: 14.6 g/dL (ref 12.0–15.0)
Potassium: 5.2 mEq/L — ABNORMAL HIGH (ref 3.5–5.1)
Sodium: 140 mEq/L (ref 135–145)

## 2013-01-04 MED ORDER — ONDANSETRON 8 MG PO TBDP
8.0000 mg | ORAL_TABLET | Freq: Once | ORAL | Status: AC
  Administered 2013-01-04: 8 mg via ORAL
  Filled 2013-01-04: qty 1

## 2013-01-04 NOTE — ED Notes (Signed)
Pt states was here yesterday and diagnosed w/ vertigo, states feels like that is a little better today but has been nauseous today, denies vomiting/diarrhea. States she has also been itching all over, took a benadryl, states started taking valium today.

## 2013-01-04 NOTE — ED Provider Notes (Signed)
CSN: [id number]     Arrival date & time 01/04/13  2223 History  First MD Initiated Contact with Patient 01/04/13 2312     Chief Complaint  Patient presents with  . Nausea   HPI Pt presents to the ED with persistent nausea and lightheadedness.  The symptoms started today.  SHe feels a sensation of room spinning.  It increases with lying down.  No numbness or weakness.  No trouble with balance or coordination.  She had similar symptoms last Thursday and she saw her primary doctor.  She was seen in the ED yesterday and was diagnosed with vertigo.  She has tried valium with some relief.   She was not feeling better today so she came to the ED.  It has not gotten worse but she continues to feel nauseated. Past Medical History  Diagnosis Date  . Lower back pain   . Asthma   . Adnexal cyst 2011    removed  . Hip pain, right    Past Surgical History  Procedure Laterality Date  . Tubal ligation    . Breast reduction surgery     No family history on file. History  Substance Use Topics  . Smoking status: Never Smoker   . Smokeless tobacco: Not on file  . Alcohol Use: No   OB History   Grav Para Term Preterm Abortions TAB SAB Ect Mult Living                 Review of Systems  Allergies  Other; Primatene mist; Aleve; Bactrim ds; Cocoa butter; Latex; and Avocado  Home Medications   Current Outpatient Rx  Name  Route  Sig  Dispense  Refill  . Calcium Carbonate (CALTRATE 600 PO)   Oral   Take 600 mg by mouth at bedtime.         . Cholecalciferol (VITAMIN D) 2000 UNITS tablet   Oral   Take 2,000 Units by mouth at bedtime.         . diazepam (VALIUM) 5 MG tablet   Oral   Take 1 tablet (5 mg total) by mouth every 6 (six) hours as needed (vertigo).   10 tablet   0   . ferrous sulfate 325 (65 FE) MG tablet   Oral   Take 325 mg by mouth at bedtime.         Marland Kitchen ibuprofen (ADVIL,MOTRIN) 200 MG tablet   Oral   Take 600 mg by mouth every other day. At bedtime (alternating with  acetaminophen)         . magnesium oxide (MAG-OX) 400 MG tablet   Oral   Take 400 mg by mouth at bedtime.         Marland Kitchen POTASSIUM PO   Oral   Take 1 tablet by mouth at bedtime. Over the counter product (does not know strength         . traMADol (ULTRAM) 50 MG tablet   Oral   Take 1 tablet (50 mg total) by mouth every 6 (six) hours as needed for pain.   30 tablet   0   . meclizine (ANTIVERT) 25 MG tablet   Oral   Take 1 tablet (25 mg total) by mouth 3 (three) times daily as needed.   30 tablet   0   . ondansetron (ZOFRAN ODT) 8 MG disintegrating tablet   Oral   Take 1 tablet (8 mg total) by mouth every 8 (eight) hours as needed for nausea.  20 tablet   0    BP 118/63  Pulse 76  Temp(Src) 98.3 F (36.8 C) (Oral)  Resp 16  SpO2 97%  LMP 12/13/2012 Physical Exam  Nursing note and vitals reviewed. Constitutional: She is oriented to person, place, and time. She appears well-developed and well-nourished. No distress.  HENT:  Head: Normocephalic and atraumatic.  Right Ear: External ear normal.  Left Ear: External ear normal.  Mouth/Throat: Oropharynx is clear and moist.  Eyes: Conjunctivae are normal. Right eye exhibits no discharge. Left eye exhibits no discharge. No scleral icterus.  Neck: Neck supple. No tracheal deviation present.  Cardiovascular: Normal rate, regular rhythm and intact distal pulses.   Pulmonary/Chest: Effort normal and breath sounds normal. No stridor. No respiratory distress. She has no wheezes. She has no rales.  Abdominal: Soft. Bowel sounds are normal. She exhibits no distension. There is no tenderness. There is no rebound and no guarding.  Musculoskeletal: She exhibits no edema and no tenderness.  Neurological: She is alert and oriented to person, place, and time. She has normal strength. No cranial nerve deficit ( no gross defecits noted) or sensory deficit. She exhibits normal muscle tone. She displays no seizure activity. Coordination normal.   No pronator drift bilateral upper extrem, able to hold both legs off bed for 5 seconds, sensation intact in all extremities, no visual field cuts, no left or right sided neglect, normal finger to nose exam  Skin: Skin is warm and dry. No rash noted.  Psychiatric: She has a normal mood and affect.    ED Course  Procedures (including critical care time) Labs Review Labs Reviewed  POCT I-STAT, CHEM 8 - Abnormal; Notable for the following:    Potassium 5.2 (*)    Glucose, Bld 125 (*)    All other components within normal limits   Imaging Review No results found.  MDM   1. Nausea   2. Vertigo    The patient's symptoms are suggestive of peripheral vertigo. She denies any issues with ataxia, trouble with her gait or coordination. I doubt central cause. Patient improved after a dose of Zofran. I did notice slight increase in her potassium I suspect is related to homolysis with her i-STAT    Celene Kras, MD 01/05/13 [phone]

## 2013-01-05 MED ORDER — ONDANSETRON 8 MG PO TBDP: 8.0000 mg | ORAL_TABLET | Freq: Three times a day (TID) | ORAL | Status: DC | PRN

## 2013-01-05 MED ORDER — MECLIZINE HCL 25 MG PO TABS: 25.0000 mg | ORAL_TABLET | Freq: Three times a day (TID) | ORAL | Status: DC | PRN

## 2013-04-18 ENCOUNTER — Encounter: Payer: BC Managed Care – PPO | Admitting: Obstetrics & Gynecology

## 2013-05-03 ENCOUNTER — Ambulatory Visit: Payer: No Typology Code available for payment source

## 2013-05-04 ENCOUNTER — Encounter (HOSPITAL_COMMUNITY): Payer: Self-pay | Admitting: Emergency Medicine

## 2013-05-04 ENCOUNTER — Emergency Department (HOSPITAL_COMMUNITY)
Admission: EM | Admit: 2013-05-04 | Discharge: 2013-05-04 | Disposition: A | Discharge status: Home / Self Care | Payer: No Typology Code available for payment source | Attending: Emergency Medicine | Admitting: Emergency Medicine

## 2013-05-04 ENCOUNTER — Emergency Department (HOSPITAL_COMMUNITY): Payer: No Typology Code available for payment source

## 2013-05-04 DIAGNOSIS — Z8742 Personal history of other diseases of the female genital tract: Secondary | ICD-10-CM | POA: Insufficient documentation

## 2013-05-04 DIAGNOSIS — J069 Acute upper respiratory infection, unspecified: Secondary | ICD-10-CM | POA: Insufficient documentation

## 2013-05-04 DIAGNOSIS — J45901 Unspecified asthma with (acute) exacerbation: Secondary | ICD-10-CM | POA: Insufficient documentation

## 2013-05-04 DIAGNOSIS — B349 Viral infection, unspecified: Secondary | ICD-10-CM

## 2013-05-04 DIAGNOSIS — B9789 Other viral agents as the cause of diseases classified elsewhere: Secondary | ICD-10-CM | POA: Insufficient documentation

## 2013-05-04 DIAGNOSIS — Z91040 Latex allergy status: Secondary | ICD-10-CM | POA: Insufficient documentation

## 2013-05-04 DIAGNOSIS — Z8739 Personal history of other diseases of the musculoskeletal system and connective tissue: Secondary | ICD-10-CM | POA: Insufficient documentation

## 2013-05-04 MED ORDER — ALBUTEROL SULFATE (2.5 MG/3ML) 0.083% IN NEBU
5.0000 mg | INHALATION_SOLUTION | Freq: Once | RESPIRATORY_TRACT | Status: AC
  Administered 2013-05-04: 5 mg via RESPIRATORY_TRACT
  Filled 2013-05-04: qty 6

## 2013-05-04 NOTE — ED Provider Notes (Signed)
CSN: [id number]     Arrival date & time 05/04/13  0617 History   First MD Initiated Contact with Patient 05/04/13 [phone]     Chief Complaint  Patient presents with  . URI   (Consider location/radiation/quality/duration/timing/severity/associated sxs/prior Treatment) The history is provided by the patient. No language interpreter was used.  Tanetta JELINA PAULSEN is a 48 year old female with past medical history of asthma presenting to the emergency department with cough, nasal congestion, sore throat that started 2 days ago. Patient reported wet cough. Stated that her throat has become sore secondary to the constant coughing. Patient reported that when she lays down she feels as if she has fluid in her chest. Reported shortness of breath, wheezing sensation localized to the chest. Reported that she's been using lozenges and Tylenol with minimal relief. Stated that she's been having intermittent fever, but has been controlled with Tylenol. Denied headache, nausea, vomiting, diarrhea, dizziness, numbness, tingling, neck pain, neck stiffness, difficulty swallowing, chest pain, difficulty breathing. PCP Dr. Marilynn Rail   Past Medical History  Diagnosis Date  . Lower back pain   . Asthma   . Adnexal cyst 2011    removed  . Hip pain, right    Past Surgical History  Procedure Laterality Date  . Tubal ligation    . Breast reduction surgery     No family history on file. History  Substance Use Topics  . Smoking status: Never Smoker   . Smokeless tobacco: Not on file  . Alcohol Use: No   OB History   Grav Para Term Preterm Abortions TAB SAB Ect Mult Living                 Review of Systems  Constitutional: Positive for fever. Negative for chills.  HENT: Positive for sore throat. Negative for trouble swallowing.   Respiratory: Positive for cough and shortness of breath. Negative for chest tightness.   Cardiovascular: Negative for chest pain.  Gastrointestinal: Negative for nausea, vomiting,  abdominal pain and diarrhea.  Musculoskeletal: Negative for back pain, myalgias and neck pain.  Neurological: Negative for dizziness and weakness.  All other systems reviewed and are negative.    Allergies  Other; Primatene mist; Aleve; Bactrim ds; Cocoa butter; Latex; and Avocado  Home Medications   Current Outpatient Rx  Name  Route  Sig  Dispense  Refill  . Calcium Carbonate (CALTRATE 600 PO)   Oral   Take 600 mg by mouth at bedtime.         . Cholecalciferol (VITAMIN D) 2000 UNITS tablet   Oral   Take 2,000 Units by mouth at bedtime.         . ferrous sulfate 325 (65 FE) MG tablet   Oral   Take 325 mg by mouth at bedtime.         Marland Kitchen ibuprofen (ADVIL,MOTRIN) 200 MG tablet   Oral   Take 600 mg by mouth every other day. At bedtime (alternating with acetaminophen)         . magnesium oxide (MAG-OX) 400 MG tablet   Oral   Take 400 mg by mouth at bedtime.         Marland Kitchen POTASSIUM PO   Oral   Take 1 tablet by mouth at bedtime. Over the counter product (does not know strength         . predniSONE (DELTASONE) 50 MG tablet   Oral   Take 50 mg by mouth daily as needed. For body pain         .  traMADol (ULTRAM) 50 MG tablet   Oral   Take 1 tablet (50 mg total) by mouth every 6 (six) hours as needed for pain.   30 tablet   0    BP 133/89  Pulse 107  Temp(Src) 98.7 F (37.1 C) (Oral)  Resp 16  Ht 5\' 2"  (1.575 m)  Wt 273 lb (123.832 kg)  BMI 49.92 kg/m2  SpO2 100%  LMP 04/19/2013 Physical Exam  Nursing note and vitals reviewed. Constitutional: She is oriented to person, place, and time. She appears well-developed and well-nourished. No distress.  HENT:  Head: Normocephalic and atraumatic.  Mouth/Throat: Oropharynx is clear and moist. No oropharyngeal exudate.  Negative swelling, erythema, inflammation, exudate, petechiae noted to posterior oropharynx and bilateral tonsils. Negative swelling to bilateral tonsils identified. Uvula midline, symmetrical  elevation. Negative uvula swelling. Negative trismus.  Eyes: Conjunctivae and EOM are normal. Pupils are equal, round, and reactive to light. Right eye exhibits no discharge. Left eye exhibits no discharge.  Neck: Normal range of motion. Neck supple.  Negative neck stiffness Negative nuchal rigidity Negative cervical lymphadenopathy  Cardiovascular: Normal rate, regular rhythm and normal heart sounds.  Exam reveals no friction rub.   No murmur heard. Pulses:      Radial pulses are 2+ on the right side, and 2+ on the left side.  Cap refill < 3 seconds.   Pulmonary/Chest: Effort normal and breath sounds normal. No respiratory distress. She has no wheezes. She has no rales. She exhibits no tenderness.  Airway intact Patient is able speak in full sentences difficulty Negative stridor Negative signs of respiratory distress Negative use of accessory muscles  Musculoskeletal: Normal range of motion.  Full ROM to upper and lower extremities without difficulty noted, negative ataxia noted.  Lymphadenopathy:    She has no cervical adenopathy.  Neurological: She is alert and oriented to person, place, and time. She exhibits normal muscle tone. Coordination normal.  Skin: Skin is warm and dry. No rash noted. She is not diaphoretic. No erythema.  Psychiatric: She has a normal mood and affect. Her behavior is normal. Thought content normal.    ED Course  Procedures (including critical care time)  7:42 AM Dr. Rosalyn Gess performed bedside ultrasound of the heart to identify possible CHF.   Dg Chest 2 View  05/04/2013   CLINICAL DATA:  Sore throat and chest pressure  EXAM: CHEST  2 VIEW  COMPARISON:  June 02, 2007  FINDINGS: Lungs are clear. Heart size and pulmonary vascularity are normal. No adenopathy. No pneumothorax. There is lower thoracic levoscoliosis.  IMPRESSION: No edema or consolidation.   Electronically Signed   By: Lowella Grip M.D.   On: 05/04/2013 07:08   Labs Review Labs  Reviewed - No data to display Imaging Review Dg Chest 2 View  05/04/2013   CLINICAL DATA:  Sore throat and chest pressure  EXAM: CHEST  2 VIEW  COMPARISON:  June 02, 2007  FINDINGS: Lungs are clear. Heart size and pulmonary vascularity are normal. No adenopathy. No pneumothorax. There is lower thoracic levoscoliosis.  IMPRESSION: No edema or consolidation.   Electronically Signed   By: Lowella Grip M.D.   On: 05/04/2013 07:08    EKG Interpretation   None       MDM   1. Viral syndrome   2. URI, acute     Medications  albuterol (PROVENTIL) (2.5 MG/3ML) 0.083% nebulizer solution 5 mg (5 mg Nebulization Given 05/04/13 0800)   Filed Vitals:   05/04/13  [id number] 05/04/13 0800 05/04/13 0853  BP: 133/89    Pulse: 90  107  Temp: 98.1 F (36.7 C)  98.7 F (37.1 C)  TempSrc: Oral  Oral  Resp: 18  16  Height: 5\' 2"  (1.575 m)    Weight: 273 lb (123.832 kg)    SpO2: 100% 98% 100%    Patient presenting to emergency department with cough, nasal congestion, sore throat that has been ongoing for 2 days. Reported intermittent fever that has been controlled with Tylenol. Reported shortness of breath, described as a wheezing in her chest that she feels. Stated that when she lies down she feels as if she has fluid in her chest. Alert and oriented. GCS 15. Heart rate and rhythm normal. Lungs clear to auscultation to upper and lower lobes. Negative use of accessory muscles, good lung expansion. Negative signs of respiratory distress. Patient stable to speak in full sentences without difficulty. Negative rales, crackles or wheezes auscultated. Radial pulses 2+ bilaterally. Cap refill less than 3 seconds. Strength intact. Chest x-ray negative for acute cardiac pulmonary disease-negative findings for edema or consolidation. Doubt CHF. Doubt pneumonia. PERC score 0 - doubt PE. Suspicion to be viral syndrome-possible bronchitis. Albuterol nebulizer treatment administered in ED setting. Reported that  breathing improved somewhat - discussed this with Dr. Rosalyn Gess who cleared patient for discharge. Discussed with patient to rest and stay hydrated. Discussed with patient to avoid any physical or strenuous activity. Referred patient to primary care provider to be reassessed. Discussed with patient that if symptoms are not to improve in the next 48 hours to report back to the ED. Discussed with patient to closely monitor symptoms and if symptoms are to worsen or change to report back to the ED - strict return instructions given.  Patient agreed to plan of care, understood, all questions answered.   Jamse Mead, PA-C 05/05/13 1706

## 2013-05-04 NOTE — Discharge Instructions (Signed)
Please call your doctor for a followup appointment within 24-48 hours. When you talk to your doctor please let them know that you were seen in the emergency department and have them acquire all of your records so that they can discuss the findings with you and formulate a treatment plan to fully care for your new and ongoing problems. Please rest and stay hydrated You most likely have a viral illness Please continue to monitor symptoms closely and if symptoms are to worsen or change (fever greater than 101, chills, neck pain, chest pain, shortness of breath, difficulty breathing, stomach pain, vomiting, nausea, diarrhea, bloody stools) or if symptoms do not improve within the next 48 hours please report back to emergency department immediately.  Upper Respiratory Infection, Adult An upper respiratory infection (URI) is also known as the common cold. It is often caused by a type of germ (virus). Colds are easily spread (contagious). You can pass it to others by kissing, coughing, sneezing, or drinking out of the same glass. Usually, you get better in 1 or 2 weeks.  HOME CARE   Only take medicine as told by your doctor.  Use a warm mist humidifier or breathe in steam from a hot shower.  Drink enough water and fluids to keep your pee (urine) clear or pale yellow.  Get plenty of rest.  Return to work when your temperature is back to normal or as told by your doctor. You may use a face mask and wash your hands to stop your cold from spreading. GET HELP RIGHT AWAY IF:   After the first few days, you feel you are getting worse.  You have questions about your medicine.  You have chills, shortness of breath, or brown or red spit (mucus).  You have yellow or brown snot (nasal discharge) or pain in the face, especially when you bend forward.  You have a fever, puffy (swollen) neck, pain when you swallow, or white spots in the back of your throat.  You have a bad headache, ear pain, sinus pain, or  chest pain.  You have a high-pitched whistling sound when you breathe in and out (wheezing).  You have a lasting cough or cough up blood.  You have sore muscles or a stiff neck. MAKE SURE YOU:   Understand these instructions.  Will watch your condition.  Will get help right away if you are not doing well or get worse. Document Released: 09/15/2007 Document Revised: 06/21/2011 Document Reviewed: 08/03/2010 [facility] Patient Information 2014 [place], [place].   Emergency Department Resource Guide 1) Find a Doctor and Pay Out of Pocket Although you won't have to find out who is covered by your insurance plan, it is a good idea to ask around and get recommendations. You will then need to call the office and see if the doctor you have chosen will accept you as a new patient and what types of options they offer for patients who are self-pay. Some doctors offer discounts or will set up payment plans for their patients who do not have insurance, but you will need to ask so you aren't surprised when you get to your appointment.  2) Contact Your Local Health Department Not all health departments have doctors that can see patients for sick visits, but many do, so it is worth a call to see if yours does. If you don't know where your local health department is, you can check in your phone book. The CDC also has a tool to help you  locate your state's health department, and many state websites also have listings of all of their local health departments.  3) Find a [facility] If your illness is not likely to be very severe or complicated, you may want to try a walk in clinic. These are popping up all over the country in pharmacies, drugstores, and shopping centers. They're usually staffed by nurse practitioners or physician assistants that have been trained to treat common illnesses and complaints. They're usually fairly quick and inexpensive. However, if you have serious medical issues or chronic  medical problems, these are probably not your best option.  No Primary Care Doctor: - Call Health Connect at  [phone] - they can help you locate a primary care doctor that  accepts your insurance, provides certain services, etc. - Physician Referral Service- [phone]  Chronic Pain Problems: Organization         Address  Phone   Notes  [facility]  [phone] Patients need to be referred by their primary care doctor.   Medication Assistance: Organization         Address  Phone   Notes  [facility] [address] [place], [place] [address] [phone] --Must be a resident of [facility] -- Must have NO insurance coverage whatsoever (no Medicaid/ Medicare, etc.) -- The pt. MUST have a primary care doctor that directs their care regularly and follows them in the community   Med[facility]ist  [phone]   [employer]  [phone]    Agencies that provide inexpensive medical care: Organization         Address  Phone   Notes  [place]  [phone]   [facility]    [phone]   [facility] [place] [place], [place] [address] [phone]   [place] [address], [place] [phone]   Planned Parenthood    [phone]   [facility]    [phone]   [place] and [place] [address], [place] Phone:  [phone], Fax:  [phone] Hours of Operation:  9 am - 6 pm, M-F.  Also accepts Medicaid/Medicare and self-pay.  [facility] for [place] [address], [address], [place] Phone: [phone], Fax: [phone]. Hours of Operation:  8:30 am - 5:30 pm, M-F.  Also accepts Medicaid and self-pay.  [facility] [place] [address], [place] Phone: [phone]   [place], [place], [place] [phone],  Ext. [phone] Mondays & Thursdays: 7-9 AM.  First 15 patients are seen on a first come, first serve basis.    [place] Providers:  Organization         Address  Phone   Notes  [facility] [address], [place] [phone] Also accepts self-pay patients.  [facility] [address], [place]  [phone]   [place], [address], [place] [phone]   [facility] [address], [place] [phone]   Lucianne Lei [address], [place]   [phone] Only accepts [place] Access [place] patients after they have their name applied to their card.   Self-Pay (no insurance) in [facility]:  Organization  Address  Phone   Notes  Sickle Cell Patients, [facility] [place] [phone]   [facility] [place] [phone]   [facility] [place]  [address], [place] [phone]   [facility]/Dr. Osei-Bonsu  [address] [place] or [address], [place] [phone] Phone number for both [place] and [place] locations is the same.  [facility] [address] [place] [phone]   [facility] [address] [place] or [address] [phone]   [facility] [address], [place] [phone], phone; [phone], fax Sees patients 1st and 3rd Saturday of every month.  Must not qualify for public or private insurance (i.e. Medicaid, Medicare, [place] Health Choice, Veterans' Benefits)  Household income should be no more than 200% of the poverty level The clinic cannot treat you if you are pregnant or think you are pregnant  Sexually transmitted diseases are not  treated at the clinic.    Dental Care: Organization         Address  Phone  Notes  [facility] Department of [facility] [place] [phone] Accepts children up to age 47 who are enrolled in [place] or [place]; pregnant women with a Medicaid card; and children who have applied for Medicaid or [place] Health Choice, but were declined, whose parents can pay a reduced fee at time of service.  [facility] Department of [facility]  [address], [place] [phone] Accepts children up to age 54 who are enrolled in [place] or [place]; pregnant women with a Medicaid card; and children who have applied for Medicaid or [place] Health Choice, but were declined, whose parents can pay a reduced fee at time of service.  [place] Adult Dental Access PROGRAM  [place] [phone] Patients are seen by appointment only. Walk-ins are not accepted. [place] will see patients 66 years of age and older. Monday - Tuesday (8am-5pm) Most Wednesdays (8:30-5pm) $30 per visit, cash only  [facility] Adult Dental Access PROGRAM  [address], [facility] [phone] Patients are seen by appointment only. Walk-ins are not accepted. [place] will see patients 62 years of age and older. One Wednesday Evening (Monthly: Volunteer Based).  $30 per visit, cash only  [place]  [phone] for adults; Children under age 72, call Graduate Pediatric Dentistry at [phone]. Children aged 60-14, please call [phone] to request a pediatric application.  Dental services are provided in all areas of dental care including fillings, crowns and bridges, complete and partial dentures, implants, gum treatment, root canals, and extractions. Preventive care is also provided. Treatment is provided to both adults and children. Patients are selected via a lottery and there is  often a waiting list.   [facility] [address], [place]  [phone] [email or web address]   [facility] [address] [place], [place] [phone], Ext. [phone] Second and Fourth Thursday of each month, opens at 6:30 AM; Clinic ends at 9 AM.  Patients are seen on a first-come first-served basis, and a limited number are seen during each clinic.   [facility]  [address] [place], [place]  [place], [place] [phone]   Eligibility Requirements You must have lived in [place], [place], or [place] counties for at least the last three months.   You cannot be eligible for state or federal sponsored [employer], including [employer], [place], or [employer].   You generally cannot be eligible for healthcare insurance through your employer.    How to apply: Eligibility screenings are held every Tuesday and Wednesday afternoon from 1:00 pm until 4:00 pm. You do not need an appointment for the interview!  [facility] [address], [place], [place]   [place]  [place] Department  [place]  [phone]    Behavioral Health Resources in the Community: Intensive Outpatient Programs Organization         Address  Phone  Notes  [place]. [address] [place], [place] [phone]   [facility] [address] [place], [place]   ADS: Alcohol [facility] Drug S[facility] [address] [place], [place]   [place] [address]  [place], [place] or [phone]   Substance Abuse Resources Organization         Address  Phone  Notes  Al[facility] and Drug Services  [phone]   [place]  [phone]   [facility] [place]   Chinita Pester  [phone]   Residential & Outpatient Substance  Abuse Program  [phone]   Psychological Services Organization         Address  Phone  Notes  [facility]  [place]  [phone]   [place] [address], [place] or [phone]    Mobile Crisis Teams Organization         Address  Phone  Notes  [facility], Mobile Crisis Care Unit  [phone]   [facility]  [address] [place], [place]   [place] [address] [place] [phone]    Self-Help/Support Groups Organization         Address  Phone             Notes  [place]. of [place] - variety of support groups  [place] Call for more information  Narcotics Anonymous (NA), [facility] [address], [employer] [place]  2 meetings at this location   Special educational needs teacher         Address  Phone  Notes  ASAP Residential Treatment [place],    [place]  [phone]   [facility]  [address], [place] [phone], [place], [place]   [place], [place] [phone] Admissions: 8am-3pm M-F  Incentives Substance [place] [address],    [place], [place] [phone]   [facility] [address] Jadene Pierini [place], [place]   [facility] [address]  [place], [place]   [facility] Programs - Intensive Outpatient [address] Kristeen [address], [place], [place]   [facility] [place].,  [place], [place] or [phone]   [facility] (RTS) [address], [place], [place] Accepts Medicaid  [facility] [address],  [place] [phone] Substance Abuse/Addiction Treatment   [facility] Resources Organization  Address  Phone  Notes  °[facility]  ([phone]) [phone]   °Julie Brannon, PhD [address] [place], [place]   ([phone] or ([phone]   °[facility]   [address] °[place], [place] [phone]   °[facility] [address], [place], [place] [phone] Insurance/Medicaid/sponsorship through [facility]  °[facility] [address]                                    [place], [place] [phone] Therapy/tele-psych/case  °[facility] [address]  ° [place], [place] [phone]    °Dr. Arfeen  ([phone]   °[facility] [place]  [employer] [place] [facility] 1) [address], [place] °2) [address], [place] °3)  [address], [place] ([phone]) [phone] °([phone]) [phone] ° °([phone]) [phone]   °[place] Child Abuse Hotline (336) 342-1394 or (336) [phone] (After Hours)    ° ° ° °

## 2013-05-04 NOTE — ED Notes (Signed)
PA at bedside.

## 2013-05-04 NOTE — ED Notes (Signed)
MD and PA at bedside.  

## 2013-05-04 NOTE — ED Notes (Signed)
MD at bedside. 

## 2013-05-04 NOTE — ED Notes (Signed)
Pt c/o sore throat and congestion to her chest x 2 days; pt states "I feel like there is fluid on my chest"; pt states NP cough at times

## 2013-05-04 NOTE — ED Notes (Signed)
Patient transported to X-ray 

## 2013-05-04 NOTE — ED Notes (Signed)
Pt escorted to discharge window. Verbalized understanding discharge instructions. In no acute distress.   

## 2013-05-09 NOTE — ED Provider Notes (Signed)
Medical screening examination/treatment/procedure(s) were conducted as a shared visit with non-physician practitioner(s) or resident and myself. I personally evaluated the patient during the encounter and agree with the findings and plan unless otherwise indicated.  I have personally reviewed any xrays and/ or EKG's with the provider and I agree with interpretation.  Pt with asthma hx presents with worsening sob and cough the past two days. Pt has had mild cough, currently on prednisone for sciatica sxs per ortho. Pt has not had asthma sxs for years. Patient denies blood clot history, active cancer, recent major trauma or surgery, unilateral leg swelling/ pain, recent long travel, hemoptysis or oral contraceptives.    EMERGENCY DEPARTMENT [place] CARDIAC EXAM "Study: Limited Ultrasound of the heart and pericardium"  INDICATIONS:Dyspnea Multiple views of the heart and pericardium were obtained in real-time with a multi-frequency probe.  PERFORMED LO:VFIEPP  IMAGES ARCHIVED?: Yes  FINDINGS: No pericardial effusion, Normal contractility and Tamponade physiology absent  LIMITATIONS:  none  VIEWS USED: Parasternal long axis, Parasternal short axis and Apical 4 chamber   INTERPRETATION: Cardiac activity present, Pericardial effusioin absent and Normal contractility   No wheezing. No fevers. No CHF, HTN, cardiac, chol, DM or smoking hx. Pt says worse with lying flat/ coughing. Exam lungs clear, no JVD, no leg swelling or pain, RRR, no murmurs, neck supple, well appearing. Sxs likely from URI however with worse lying flat we did obtain and xray and I did a bedside US which did not show signs of CHF. Pt has close fup outpt and strict return instructions given. CXR reviewed by myself, no acute findings.  Cough, Sore throat, Shortness of breath   Mariea Clonts, MD 05/09/13 [phone]

## 2013-05-24 ENCOUNTER — Ambulatory Visit (INDEPENDENT_AMBULATORY_CARE_PROVIDER_SITE_OTHER): Payer: No Typology Code available for payment source | Admitting: Obstetrics & Gynecology

## 2013-05-24 ENCOUNTER — Encounter: Payer: Self-pay | Admitting: Obstetrics & Gynecology

## 2013-05-24 VITALS — BP 152/97 | HR 77 | Temp 96.8°F | Ht 63.0 in | Wt 217.3 lb

## 2013-05-24 DIAGNOSIS — Z87898 Personal history of other specified conditions: Secondary | ICD-10-CM

## 2013-05-24 DIAGNOSIS — R1900 Intra-abdominal and pelvic swelling, mass and lump, unspecified site: Secondary | ICD-10-CM

## 2013-05-24 HISTORY — DX: Personal history of other specified conditions: Z87.898

## 2013-05-24 NOTE — Patient Instructions (Signed)
Pelvic Mass A "mass" is a lump that either your caregiver found during an examination or you found before seeing your caregiver. The "pelvis" is the lower portion of the trunk in between the hip bones. There are many possible reasons why a lump has appeared. Testing will help determine the cause and the steps to a solution. CAUSES  Before complete testing is done, it may be difficult or impossible for your caregiver to know if the lump is truly in one of the pelvic organs (such as the uterus or ovaries) or is coming from one of many organs that are near the pelvis. Problems in the colon or kidney can also lead to a lump that might seem to be in the pelvis. If testing shows that the mass is in the pelvis, there are still many possible causes:  Tumors and cancers. These problems are relatively common and are the greatest source of worry for patients. Cancerous lumps in the pelvis may be due to cancers that started in the uterus or ovaries or due to cancers that started in other areas and then spread to the pelvis. Many cancers are very treatable when found early.  Non-cancerous tumors and masses. There are a large number of common and uncommon non-cancerous problems that can lead to a mass in the pelvis. Two very common ones are fibroids of the uterus and ovarian cysts. Before testing and/or surgery, it may be impossible to tell the difference between these problems and a cancer.  Infection. Certain types of infections can produce a mass in the pelvis. The infection might be caused by bacteria. If there is an infection treatment might include antibiotics. Masses from infection can also be caused by certain viruses, and in rare cases, by fungi or parasites. If infection is the cause, your caregiver will be able to determine the type of germ responsible for the mass by doing appropriate testing.  Inflammatory bowel disease. These are diseases thought to be caused by a defect in the immune system of the  intestine. There are two inflammatory bowel diseases: Ulcerative Colitis and Crohn's Disease. They are lifelong problems with symptoms that can come and go. Sometimes, patients with these diseases will develop a mass in the lower part of the colon that can make it seem as though there is a mass in the pelvis.  Past Surgery. If there has been pelvic surgery in the past, and there is a lot of scarring that forms during the process of healing, this can eventually fell like a mass when examined by your caregiver. As with the other problems described above, this may or may not be associated with symptoms or feeling badly.  Ectopic Pregnancy. This is a condition where the growing fetus is growing outside of the uterus. This is a common cause for a pelvic mass and may become a serious or life-threatening problem that requires immediate surgery. SYMPTOMS  In people with a pelvic mass, there may be a large variety of associated symptoms including:   No symptoms, other than the appearance of the mass itself.  Cramping, nausea, diarrhea.  Fever, vomiting, weakness.  Pelvic, side, and/or back pain.  Weight loss.  Constipation.  Problems with vaginal bleeding. This can be very variable. Bleeding might be very light or very heavy. Bleeding may be mixed with large clots. Menstruation may be very frequent and may seem to almost completely stop. There may be varying levels of pain with menstruation.  Urinary symptoms including frequent urination, inability to empty the  bladder completely, or urinating very small amounts. DIAGNOSIS  Because of the large number of causes of a mass in the pelvis, your caregiver will ask you to undergo testing in order to get a clear diagnosis in a timely manner. The tests might include some or all of the following:  Blood tests such as a blood count, measurement of common minerals in the blood, kidney/liver/pancreas function, pregnancy test, and others.  X-rays. Plain x-rays  and special x-rays may be requested except if you are pregnant.  Ultrasound. This is a test that uses sound waves to "paint a picture" of the mass. The type of "sound picture" that is seen can help to narrow the diagnosis.  CAT scan and MRI imaging. Each can provide additional information as to the different characteristics of the mass and can help to develop a final plan for diagnostics and treatment. If cancer is suspected, these special tests can also help to show any spread of the cancer to other parts of the body. It is possible that these tests may not be ordered if you are pregnant.  Laparoscopy. This is a special exam of the inside of the pelvic area using a slim, flexible, lighted tube. This allows your caregiver to get a direct look at the mass. Sometimes, this allows getting a very small piece of the mass (a biopsy). This piece of tissue can then be examined in a lab that will frequently lead to a clear diagnosis. In some cases, your caregiver can use a laparoscope to completely remove the mass after it has been examined.  Surgery. Sometimes, a diagnosis can only be made by carrying out an operation and obtaining a biopsy (as noted above). Many times, the biopsy is obtained and the mass is removed during the same operation. These are the most common ways for determining the exact cause of the mass. Your caregiver may recommend other tests that are not listed here. TREATMENT  Treatment(s) can only be recommended after a diagnosis is made. Your caregiver will discuss your test results with you, the meanings of the tests, and the recommended steps to begin treatment. He/she will also recommend whether you need to be examined by specialists as you go through the steps of diagnosis and a treatment plan is developed. HOME CARE INSTRUCTIONS   Test preparation. Carefully follow instructions when preparing for certain tests. This may involve many things such as:  Drinking fluids to fill the bladder  before a pelvic ultrasound.  Fasting before certain blood tests.  Drinking special "contrast" fluids that are necessary for obtaining the best CAT scan and MRI images.  Medications. Your caregiver may prescribe medications to help relieve symptoms while you undergo testing. It is important that your current medications (prescription, non-prescription, herbal, vitamins, etc.) be kept in mind when new prescriptions are recommended.  Diet. There may be a need for changes in diet in order to help with symptom relief while testing is being done. If this applies to you, your caregiver will discuss these changes with you. SEEK MEDICAL CARE IF:   You cannot hold down any of the recommended fluids used to prepare for tests such as CAT scan MRI.  You feel that you are having trouble with any new prescriptions.  You develop new symptoms of pain, vomiting, diarrhea, fever, or other problems that you did not feel since your last exam.  You experience inability to empty your bladder completely or develop painful and/or bloody urination. SEEK IMMEDIATE MEDICAL CARE IF:  develop new symptoms of pain, vomiting, diarrhea, fever, or other problems that you did not feel since your last exam.   You experience inability to empty your bladder completely or develop painful and/or bloody urination.  SEEK IMMEDIATE MEDICAL CARE IF:    You vomit bright red blood, or a coffee ground appearing material.   You have blood in your stools, or the stools turn black and tarry.   You have an abnormal or increased amount of vaginal bleeding.   You have a fever.   You develop easy bruising or bleeding.   You develop pain that is not controlled by your medication.   You feel worsening weakness or you have a fainting episode.   You feel that the mass has suddenly gotten larger.   You develop severe bloating in the abdomen and/or pelvis.   You cannot pass any urine.  MAKE SURE YOU:    Understand these instructions.   Will watch your condition.   Will get help right away if you are not doing well or get worse.  Document Released: 07/06/2006 Document Revised: 06/21/2011 Document Reviewed: 03/14/2007  [employer] Patient Information 2014 [employer]

## 2013-05-24 NOTE — Progress Notes (Signed)
Patient ID: Brandi Chase, female   DOB: 1966/02/05, 48 y.o.   MRN: [id number]  Chief Complaint  Patient presents with  . Ovarian Cyst    no symptoms was told by orthpedic doctor there was a mass    HPI Brandi Chase is a 48 y.o. female.  G3P3, s/p BTL, had right fallopian tube cyst removed 06/2009 Dr. Hulan Fray. An x-ray at her orthopedist may have shown a pelvic mass and she comes for second opinion. NO pain, normal menses. Patient's last menstrual period was 04/19/2013.   HPI  Past Medical History  Diagnosis Date  . Lower back pain   . Asthma   . Adnexal cyst 2011    removed  . Hip pain, right     Past Surgical History  Procedure Laterality Date  . Tubal ligation    . Breast reduction surgery      No family history on file.  Social History History  Substance Use Topics  . Smoking status: Never Smoker   . Smokeless tobacco: Not on file  . Alcohol Use: No    Allergies  Allergen Reactions  . 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 Primatene Mist [Epinephrine] Other (See Comments)    Blacked out  . Aleve [Naproxen Sodium] Other (See Comments)    Chest pains  . Bactrim Ds [Sulfamethoxazole W/Trimethoprim (Co-Trimoxazole)] Other (See Comments)    Seeing people while taking med.  . Cocoa Butter Itching  . Latex Itching  . Avocado Rash    Current Outpatient Prescriptions  Medication Sig Dispense Refill  . Calcium Carbonate (CALTRATE 600 PO) Take 600 mg by mouth at bedtime.      . Cholecalciferol (VITAMIN D) 2000 UNITS tablet Take 2,000 Units by mouth at bedtime.      . ferrous sulfate 325 (65 FE) MG tablet Take 325 mg by mouth at bedtime.      Marland Kitchen ibuprofen (ADVIL,MOTRIN) 200 MG tablet Take 600 mg by mouth every other day. At bedtime (alternating with acetaminophen)      . magnesium oxide (MAG-OX) 400 MG tablet Take 400 mg by mouth at bedtime.      Marland Kitchen POTASSIUM PO Take 1 tablet by mouth at bedtime. Over the counter product (does not know strength      .  traMADol (ULTRAM) 50 MG tablet Take 1 tablet (50 mg total) by mouth every 6 (six) hours as needed for pain.  30 tablet  0  . predniSONE (DELTASONE) 50 MG tablet Take 50 mg by mouth daily as needed. For body pain      . [DISCONTINUED] Potassium 95 MG TABS Take 1 tablet by mouth daily.      . [DISCONTINUED] POTASSIUM CHLORIDE PO Take 1 tablet by mouth daily. OTC       No current facility-administered medications for this visit.    Review of Systems Review of Systems  Constitutional: Negative for fever.  Genitourinary: Negative for dysuria, vaginal bleeding, menstrual problem and pelvic pain.  Musculoskeletal: Positive for arthralgias.    Blood pressure 152/97, pulse 77, temperature 96.8 F (36 C), temperature source Oral, height 5\' 3"  (1.6 m), weight 217 lb 4.8 oz (98.567 kg), last menstrual period 04/19/2013.  Physical Exam Physical Exam  Constitutional: She is oriented to person, place, and time. She appears well-developed. No distress.  obese  Abdominal: Soft.  Genitourinary: Vagina normal and uterus normal. No vaginal discharge found.  No mass  Neurological: She is alert and oriented to person, place,  and time.  Skin: Skin is warm and dry.  Psychiatric: She has a normal mood and affect. Her behavior is normal.    Data Reviewed   Assessment       normal exam, r/o mass seen on x ray  Plan    Pelvic US, notify of result        Brandi Chase 05/24/2013, 1:44 PM

## 2013-05-29 ENCOUNTER — Ambulatory Visit (HOSPITAL_COMMUNITY): Payer: No Typology Code available for payment source

## 2013-07-24 ENCOUNTER — Telehealth: Payer: Self-pay | Admitting: General Practice

## 2013-07-24 NOTE — Telephone Encounter (Signed)
Per chart review patient cancelled ultrasound appt in February. Called patient and asked if she would like for me to get this appt rescheduled. Patient stated no not at this time. Told patient to call us or ultrasound back if she wants to get the ultrasound done in the future. Patient verbalized understanding and had no further questions

## 2013-09-04 ENCOUNTER — Other Ambulatory Visit: Payer: Self-pay | Admitting: Family Medicine

## 2013-09-04 DIAGNOSIS — Z1231 Encounter for screening mammogram for malignant neoplasm of breast: Secondary | ICD-10-CM

## 2013-09-20 ENCOUNTER — Ambulatory Visit
Admission: RE | Admit: 2013-09-20 | Discharge: 2013-09-20 | Disposition: A | Discharge status: Home / Self Care | Payer: No Typology Code available for payment source | Source: Ambulatory Visit | Attending: Family Medicine | Admitting: Family Medicine

## 2013-09-20 DIAGNOSIS — Z1231 Encounter for screening mammogram for malignant neoplasm of breast: Secondary | ICD-10-CM

## 2013-11-29 ENCOUNTER — Emergency Department (HOSPITAL_COMMUNITY)
Admission: EM | Admit: 2013-11-29 | Discharge: 2013-11-30 | Disposition: A | Discharge status: Home / Self Care | Payer: No Typology Code available for payment source | Attending: Emergency Medicine | Admitting: Emergency Medicine

## 2013-11-29 ENCOUNTER — Encounter (HOSPITAL_COMMUNITY): Payer: Self-pay | Admitting: Emergency Medicine

## 2013-11-29 DIAGNOSIS — R51 Headache: Secondary | ICD-10-CM | POA: Diagnosis not present

## 2013-11-29 DIAGNOSIS — H6120 Impacted cerumen, unspecified ear: Secondary | ICD-10-CM | POA: Diagnosis not present

## 2013-11-29 DIAGNOSIS — J45909 Unspecified asthma, uncomplicated: Secondary | ICD-10-CM | POA: Diagnosis not present

## 2013-11-29 DIAGNOSIS — Z79899 Other long term (current) drug therapy: Secondary | ICD-10-CM | POA: Insufficient documentation

## 2013-11-29 DIAGNOSIS — R519 Headache, unspecified: Secondary | ICD-10-CM

## 2013-11-29 DIAGNOSIS — H6123 Impacted cerumen, bilateral: Secondary | ICD-10-CM

## 2013-11-29 LAB — CBC WITH DIFFERENTIAL/PLATELET
Basophils Absolute: 0 10*3/uL (ref 0.0–0.1)
Basophils Relative: 0 % (ref 0–1)
EOS ABS: 0 10*3/uL (ref 0.0–0.7)
Eosinophils Relative: 0 % (ref 0–5)
HCT: 34.2 % — ABNORMAL LOW (ref 36.0–46.0)
HEMOGLOBIN: 11.2 g/dL — AB (ref 12.0–15.0)
Lymphocytes Relative: 32 % (ref 12–46)
Lymphs Abs: 3.1 10*3/uL (ref 0.7–4.0)
MCH: 28.7 pg (ref 26.0–34.0)
MCHC: 32.7 g/dL (ref 30.0–36.0)
MCV: 87.7 fL (ref 78.0–100.0)
MONO ABS: 0.6 10*3/uL (ref 0.1–1.0)
MONOS PCT: 6 % (ref 3–12)
Neutro Abs: 5.9 10*3/uL (ref 1.7–7.7)
Neutrophils Relative %: 62 % (ref 43–77)
Platelets: 312 10*3/uL (ref 150–400)
RBC: 3.9 MIL/uL (ref 3.87–5.11)
RDW: 14.1 % (ref 11.5–15.5)
WBC: 9.6 10*3/uL (ref 4.0–10.5)

## 2013-11-29 LAB — BASIC METABOLIC PANEL
Anion gap: 13 (ref 5–15)
BUN: 17 mg/dL (ref 6–23)
CO2: 22 mEq/L (ref 19–32)
CREATININE: 0.84 mg/dL (ref 0.50–1.10)
Calcium: 9.6 mg/dL (ref 8.4–10.5)
Chloride: 102 mEq/L (ref 96–112)
GFR, EST NON AFRICAN AMERICAN: 81 mL/min — AB (ref >90)
GLUCOSE: 93 mg/dL (ref 70–99)
POTASSIUM: 4.1 meq/L (ref 3.7–5.3)
Sodium: 137 mEq/L (ref 137–147)

## 2013-11-29 LAB — SEDIMENTATION RATE: Sed Rate: 25 mm/hr — ABNORMAL HIGH (ref 0–22)

## 2013-11-29 MED ORDER — PROCHLORPERAZINE EDISYLATE 5 MG/ML IJ SOLN
10.0000 mg | Freq: Once | INTRAMUSCULAR | Status: AC
  Administered 2013-11-29: 10 mg via INTRAVENOUS
  Filled 2013-11-29: qty 2

## 2013-11-29 MED ORDER — MORPHINE SULFATE 4 MG/ML IJ SOLN
4.0000 mg | Freq: Once | INTRAMUSCULAR | Status: AC
  Administered 2013-11-29: 4 mg via INTRAVENOUS
  Filled 2013-11-29: qty 1

## 2013-11-29 NOTE — ED Notes (Signed)
Pt states having R sided headache/neck pain, states she thinks it could be from wax in ears, pt states she use to have to have her ears flushed d/t wax build up.

## 2013-11-29 NOTE — ED Provider Notes (Signed)
CSN: [id number]     Arrival date & time 11/29/13  2032 History   First MD Initiated Contact with Patient 11/29/13 2113     Chief Complaint  Patient presents with  . Facial Pain  . Neck Pain  . Headache   Patient is a 48 y.o. female presenting with neck pain and headaches.  Neck Pain Associated symptoms: headaches   Headache Associated symptoms: neck pain    Pt is complaining of throbbing type pain on the right side of her face.  It goes to her right eye and ear.  She also has some pain in the back of her head.  Turning her neck increases the pain.  No numbness or weakness.   No fever or vomiting.  No trouble with speech or vision.  No prior history of headaches.  No rashes on her face.  Light does not make it worse but sound does. Past Medical History  Diagnosis Date  . Lower back pain   . Asthma   . Adnexal cyst 2011    removed  . Hip pain, right    Past Surgical History  Procedure Laterality Date  . Tubal ligation    . Breast reduction surgery     No family history on file. History  Substance Use Topics  . Smoking status: Never Smoker   . Smokeless tobacco: Not on file  . Alcohol Use: No   OB History   Grav Para Term Preterm Abortions TAB SAB Ect Mult Living                 Review of Systems  Musculoskeletal: Positive for neck pain.  Neurological: Positive for headaches.  All other systems reviewed and are negative.     Allergies  Other; Primatene mist; Aleve; Bactrim ds; Cocoa butter; Latex; and Avocado  Home Medications   Prior to Admission medications   Medication Sig Start Date End Date Taking? Authorizing Provider  Calcium Carbonate (CALTRATE 600 PO) Take 600 mg by mouth at bedtime.   Yes Historical Provider, MD  Cholecalciferol (VITAMIN D) 2000 UNITS tablet Take 2,000 Units by mouth at bedtime.   Yes Historical Provider, MD  ferrous sulfate 325 (65 FE) MG tablet Take 325 mg by mouth at bedtime.   Yes Historical Provider, MD  ibuprofen (ADVIL,MOTRIN)  200 MG tablet Take 600 mg by mouth every other day. At bedtime (alternating with acetaminophen)   Yes Historical Provider, MD  magnesium oxide (MAG-OX) 400 MG tablet Take 400 mg by mouth at bedtime.   Yes Historical Provider, MD  metoprolol tartrate (LOPRESSOR) 25 MG tablet Take 25 mg by mouth 2 (two) times daily.  11/22/13  Yes Historical Provider, MD  POTASSIUM PO Take 1 tablet by mouth at bedtime. Over the counter product (does not know strength   Yes Historical Provider, MD  traMADol (ULTRAM) 50 MG tablet Take 1 tablet (50 mg total) by mouth every 6 (six) hours as needed for pain. 10/02/12  Yes Peter S Dammen, PA-C  triamcinolone cream (KENALOG) 0.1 % Apply 1 application topically 2 (two) times daily.  11/22/13  Yes Historical Provider, MD  HYDROcodone-acetaminophen (NORCO/VICODIN) 5-325 MG per tablet Take 1-2 tablets by mouth every 4 (four) hours as needed. 11/30/13   Dorie Rank, MD   BP 116/67  Pulse 58  Temp(Src) 98.2 F (36.8 C) (Oral)  Resp 16  SpO2 96%  LMP 11/22/2013 Physical Exam  Nursing note and vitals reviewed. Constitutional: She appears well-developed and well-nourished. No distress.  HENT:  Head: Normocephalic and atraumatic.  Right Ear: External ear normal.  Left Ear: External ear normal.  Occluded by cerumen   Eyes: Conjunctivae are normal. Right eye exhibits no discharge. Left eye exhibits no discharge. No scleral icterus.  Neck: Full passive range of motion without pain. Neck supple. Muscular tenderness present. No rigidity. No tracheal deviation present.  Cardiovascular: Normal rate, regular rhythm and intact distal pulses.   Pulmonary/Chest: Effort normal and breath sounds normal. No stridor. No respiratory distress. She has no wheezes. She has no rales.  Abdominal: Soft. Bowel sounds are normal. She exhibits no distension. There is no tenderness. There is no rebound and no guarding.  Musculoskeletal: She exhibits no edema and no tenderness.  Neurological: She is  alert. She has normal strength. No cranial nerve deficit (no facial droop, extraocular movements intact, no slurred speech) or sensory deficit. She exhibits normal muscle tone. She displays no seizure activity. Coordination normal.  Skin: Skin is warm and dry. No rash noted.  Psychiatric: She has a normal mood and affect.    ED Course  Procedures (including critical care time) Labs Review Labs Reviewed  CBC WITH DIFFERENTIAL - Abnormal; Notable for the following:    Hemoglobin 11.2 (*)    HCT 34.2 (*)    All other components within normal limits  BASIC METABOLIC PANEL - Abnormal; Notable for the following:    GFR calc non Af Amer 81 (*)    All other components within normal limits  SEDIMENTATION RATE - Abnormal; Notable for the following:    Sed Rate 25 (*)    All other components within normal limits     MDM   Final diagnoses:  Acute nonintractable headache, unspecified headache type  Cerumen impaction, bilateral    No discrete temporal artery ttp.  Sed reate only slightly elevated doubt temporal arteritis.  Rec follow up with PCP.  Possible tension headache or migraine.  Cerumen impaction noted.  No significant relief after irrigation in the ED.  Follow up ENT prn.    Dorie Rank, MD 11/30/13 [phone]

## 2013-11-29 NOTE — ED Notes (Signed)
Both ears flushed w/ NS

## 2013-11-29 NOTE — ED Notes (Signed)
Pt presents with c/o right side facial pain that goes from her nose and covers the right side of her face. Also c/o right neck pain and head pain as well. Pt says these symptoms have been going on for about 2 days. Pt reports no sensitivity, no N/V/D.

## 2013-11-30 ENCOUNTER — Emergency Department (HOSPITAL_COMMUNITY): Payer: No Typology Code available for payment source

## 2013-11-30 ENCOUNTER — Emergency Department (HOSPITAL_COMMUNITY)
Admission: EM | Admit: 2013-11-30 | Discharge: 2013-11-30 | Disposition: A | Discharge status: Home / Self Care | Payer: No Typology Code available for payment source | Attending: Emergency Medicine | Admitting: Emergency Medicine

## 2013-11-30 ENCOUNTER — Encounter (HOSPITAL_COMMUNITY): Payer: Self-pay | Admitting: Emergency Medicine

## 2013-11-30 DIAGNOSIS — Z791 Long term (current) use of non-steroidal anti-inflammatories (NSAID): Secondary | ICD-10-CM | POA: Insufficient documentation

## 2013-11-30 DIAGNOSIS — H6123 Impacted cerumen, bilateral: Secondary | ICD-10-CM

## 2013-11-30 DIAGNOSIS — Z91040 Latex allergy status: Secondary | ICD-10-CM | POA: Diagnosis not present

## 2013-11-30 DIAGNOSIS — R51 Headache: Secondary | ICD-10-CM | POA: Diagnosis present

## 2013-11-30 DIAGNOSIS — Z87448 Personal history of other diseases of urinary system: Secondary | ICD-10-CM | POA: Diagnosis not present

## 2013-11-30 DIAGNOSIS — Z79899 Other long term (current) drug therapy: Secondary | ICD-10-CM | POA: Insufficient documentation

## 2013-11-30 DIAGNOSIS — R519 Headache, unspecified: Secondary | ICD-10-CM

## 2013-11-30 DIAGNOSIS — J45909 Unspecified asthma, uncomplicated: Secondary | ICD-10-CM | POA: Diagnosis not present

## 2013-11-30 DIAGNOSIS — H6120 Impacted cerumen, unspecified ear: Secondary | ICD-10-CM | POA: Diagnosis not present

## 2013-11-30 DIAGNOSIS — I10 Essential (primary) hypertension: Secondary | ICD-10-CM | POA: Insufficient documentation

## 2013-11-30 LAB — CBC WITH DIFFERENTIAL/PLATELET
Basophils Absolute: 0 10*3/uL (ref 0.0–0.1)
Basophils Relative: 0 % (ref 0–1)
EOS PCT: 1 % (ref 0–5)
Eosinophils Absolute: 0.1 10*3/uL (ref 0.0–0.7)
HCT: 37.8 % (ref 36.0–46.0)
HEMOGLOBIN: 12.5 g/dL (ref 12.0–15.0)
LYMPHS PCT: 36 % (ref 12–46)
Lymphs Abs: 3.5 10*3/uL (ref 0.7–4.0)
MCH: 29.6 pg (ref 26.0–34.0)
MCHC: 33.1 g/dL (ref 30.0–36.0)
MCV: 89.6 fL (ref 78.0–100.0)
MONOS PCT: 8 % (ref 3–12)
Monocytes Absolute: 0.7 10*3/uL (ref 0.1–1.0)
NEUTROS ABS: 5.3 10*3/uL (ref 1.7–7.7)
Neutrophils Relative %: 55 % (ref 43–77)
Platelets: 309 10*3/uL (ref 150–400)
RBC: 4.22 MIL/uL (ref 3.87–5.11)
RDW: 14 % (ref 11.5–15.5)
WBC: 9.6 10*3/uL (ref 4.0–10.5)

## 2013-11-30 LAB — BASIC METABOLIC PANEL
ANION GAP: 15 (ref 5–15)
BUN: 14 mg/dL (ref 6–23)
CALCIUM: 9.3 mg/dL (ref 8.4–10.5)
CHLORIDE: 103 meq/L (ref 96–112)
CO2: 22 meq/L (ref 19–32)
CREATININE: 0.79 mg/dL (ref 0.50–1.10)
GFR calc Af Amer: >90 mL/min (ref >90)
GFR calc non Af Amer: >90 mL/min (ref >90)
GLUCOSE: 83 mg/dL (ref 70–99)
Potassium: 3.6 mEq/L — ABNORMAL LOW (ref 3.7–5.3)
Sodium: 140 mEq/L (ref 137–147)

## 2013-11-30 LAB — SEDIMENTATION RATE: SED RATE: 25 mm/h — AB (ref 0–22)

## 2013-11-30 MED ORDER — METOCLOPRAMIDE HCL 5 MG/ML IJ SOLN
10.0000 mg | Freq: Once | INTRAMUSCULAR | Status: AC
  Administered 2013-11-30: 10 mg via INTRAVENOUS
  Filled 2013-11-30: qty 2

## 2013-11-30 MED ORDER — METHOCARBAMOL 500 MG PO TABS: 1000.0000 mg | ORAL_TABLET | Freq: Four times a day (QID) | ORAL | Status: DC | PRN

## 2013-11-30 MED ORDER — DIAZEPAM 5 MG PO TABS: 5.0000 mg | ORAL_TABLET | Freq: Once | ORAL | Status: DC

## 2013-11-30 MED ORDER — METHOCARBAMOL 1000 MG/10ML IJ SOLN
1000.0000 mg | Freq: Once | INTRAMUSCULAR | Status: AC
  Filled 2013-11-30: qty 10

## 2013-11-30 MED ORDER — DIPHENHYDRAMINE HCL 50 MG/ML IJ SOLN
25.0000 mg | Freq: Once | INTRAMUSCULAR | Status: AC
  Administered 2013-11-30: 25 mg via INTRAVENOUS
  Filled 2013-11-30: qty 1

## 2013-11-30 MED ORDER — BUTALBITAL-APAP-CAFFEINE 50-325-40 MG PO TABS: 1.0000 | ORAL_TABLET | Freq: Four times a day (QID) | ORAL | Status: AC | PRN

## 2013-11-30 MED ORDER — DOCUSATE SODIUM 50 MG/5ML PO LIQD
50.0000 mg | Freq: Once | ORAL | Status: AC
  Administered 2013-11-30: 50 mg via OTIC
  Filled 2013-11-30: qty 10

## 2013-11-30 MED ORDER — HYDROCODONE-ACETAMINOPHEN 5-325 MG PO TABS: 1.0000 | ORAL_TABLET | ORAL | Status: DC | PRN

## 2013-11-30 MED ORDER — METHOCARBAMOL 1000 MG/10ML IJ SOLN: 1000.0000 mg | Freq: Once | INTRAMUSCULAR | Status: DC

## 2013-11-30 MED ORDER — SODIUM CHLORIDE 0.9 % IV BOLUS (SEPSIS)
1000.0000 mL | Freq: Once | INTRAVENOUS | Status: AC
  Administered 2013-11-30: 1000 mL via INTRAVENOUS

## 2013-11-30 MED ORDER — METHOCARBAMOL 1000 MG/10ML IJ SOLN
500.0000 mg | Freq: Once | INTRAMUSCULAR | Status: DC
  Filled 2013-11-30: qty 5

## 2013-11-30 MED ORDER — METHYLPREDNISOLONE SODIUM SUCC 125 MG IJ SOLR
125.0000 mg | Freq: Once | INTRAMUSCULAR | Status: AC
  Administered 2013-11-30: 125 mg via INTRAVENOUS
  Filled 2013-11-30: qty 2

## 2013-11-30 MED ORDER — METHOCARBAMOL 1000 MG/10ML IJ SOLN
1000.0000 mg | Freq: Once | INTRAVENOUS | Status: DC
  Administered 2013-11-30: 1000 mg via INTRAVENOUS
  Filled 2013-11-30: qty 10

## 2013-11-30 NOTE — ED Provider Notes (Signed)
CSN: [id number]     Arrival date & time 11/30/13  9892 History   First MD Initiated Contact with Patient 11/30/13 [phone]     Chief Complaint  Patient presents with  . Headache     (Consider location/radiation/quality/duration/timing/severity/associated sxs/prior Treatment) HPI  Brandi Chase is a 48 y.o. female with past medical history significant for hypertension complaining of right periorbital headache onset 2 days ago radiating down into the lateral neck. It is exacerbated by head movement associated with lightheaded sensation. Patient denies fever, cervicalgia, change in vision, nausea or vomiting, thunderclap onset, syncope, dysarthria, ataxia unilateral weakness. States that she does not normally have headaches and this is atypical. She's taking tramadol and ibuprofen 600 mg for chronic back pain which does not alleviate the pain.   Past Medical History  Diagnosis Date  . Lower back pain   . Asthma   . Adnexal cyst 2011    removed  . Hip pain, right    Past Surgical History  Procedure Laterality Date  . Tubal ligation    . Breast reduction surgery     No family history on file. History  Substance Use Topics  . Smoking status: Never Smoker   . Smokeless tobacco: Not on file  . Alcohol Use: No   OB History   Grav Para Term Preterm Abortions TAB SAB Ect Mult Living                 Review of Systems  10 systems reviewed and found to be negative, except as noted in the HPI.    Allergies  Other; Primatene mist; Aleve; Bactrim ds; Cocoa butter; Latex; and Avocado  Home Medications   Prior to Admission medications   Medication Sig Start Date End Date Taking? Authorizing Provider  albuterol (PROVENTIL HFA;VENTOLIN HFA) 108 (90 BASE) MCG/ACT inhaler Inhale 2 puffs into the lungs every 6 (six) hours as needed for wheezing or shortness of breath.   Yes Historical Provider, MD  Calcium Carbonate (CALTRATE 600 PO) Take 600 mg by mouth every evening.    Yes Historical  Provider, MD  Cholecalciferol (VITAMIN D) 2000 UNITS tablet Take 2,000 Units by mouth every morning.    Yes Historical Provider, MD  ferrous sulfate 325 (65 FE) MG tablet Take 325 mg by mouth every morning.    Yes Historical Provider, MD  ibuprofen (ADVIL,MOTRIN) 600 MG tablet Take 600 mg by mouth every morning.   Yes Historical Provider, MD  magnesium oxide (MAG-OX) 400 MG tablet Take 400 mg by mouth every morning.    Yes Historical Provider, MD  POTASSIUM PO Take 1 tablet by mouth every morning. Over the counter product (does not know strength   Yes Historical Provider, MD  traMADol (ULTRAM) 50 MG tablet Take 1 tablet (50 mg total) by mouth every 6 (six) hours as needed for pain. 10/02/12  Yes Peter S Dammen, PA-C  triamcinolone cream (KENALOG) 0.1 % Apply 1 application topically 2 (two) times daily as needed (rash).  11/22/13  Yes Historical Provider, MD  butalbital-acetaminophen-caffeine (FIORICET) 50-325-40 MG per tablet Take 1 tablet by mouth every 6 (six) hours as needed for headache. 11/30/13 11/30/14  Elmyra Ricks Dona Walby, PA-C  methocarbamol (ROBAXIN) 500 MG tablet Take 2 tablets (1,000 mg total) by mouth every 6 (six) hours as needed (Pain). 11/30/13   Tonnie Friedel, PA-C   BP 115/74  Pulse 61  Temp(Src) 98.3 F (36.8 C) (Oral)  Resp 18  SpO2 100%  LMP 11/22/2013 Physical Exam  Nursing  note and vitals reviewed. Constitutional: She is oriented to person, place, and time. She appears well-developed and well-nourished. No distress.  HENT:  Head: Normocephalic and atraumatic.  Mouth/Throat: Oropharynx is clear and moist.  Right temporal artery tenderness to palpation, no induration appreciated.  Bilateral cerumen impaction.  Eyes: Conjunctivae and EOM are normal. Pupils are equal, round, and reactive to light.  Neck: Normal range of motion. Neck supple.  FROM to C-spine. Pt can touch chin to chest without discomfort. No TTP of midline cervical spine.   Cardiovascular: Normal rate,  regular rhythm and intact distal pulses.   Pulmonary/Chest: Effort normal and breath sounds normal. No stridor. No respiratory distress. She has no wheezes. She has no rales. She exhibits no tenderness.  Abdominal: Soft.  Musculoskeletal: Normal range of motion.  Neurological: She is alert and oriented to person, place, and time.  Follows commands, Clear, goal oriented speech, Strength is 5 out of 5x4 extremities, patient ambulates with a coordinated in nonantalgic gait. Sensation is grossly intact.   Psychiatric: She has a normal mood and affect.    ED Course  Procedures (including critical care time) Labs Review Labs Reviewed  SEDIMENTATION RATE - Abnormal; Notable for the following:    Sed Rate 25 (*)    All other components within normal limits  BASIC METABOLIC PANEL - Abnormal; Notable for the following:    Potassium 3.6 (*)    All other components within normal limits  CBC WITH DIFFERENTIAL    Imaging Review Ct Head Wo Contrast  11/30/2013   CLINICAL DATA:  Headache recent year urination.  EXAM: CT HEAD WITHOUT CONTRAST  TECHNIQUE: Contiguous axial images were obtained from the base of the skull through the vertex without intravenous contrast.  COMPARISON:  None.  FINDINGS: No acute intracranial hemorrhage. No focal mass lesion. No CT evidence of acute infarction. No midline shift or mass effect. No hydrocephalus. Basilar cisterns are patent.  Air remains seroma in within the left external auditory canal. Mastoid air cells are clear. There is mild polypoid mucosal thickening in the maxillary sinuses  IMPRESSION: 1. Normal head CT. 2. Cerumen remains in the left external auditory canal. Mastoid air cells are clear.   Electronically Signed   By: Suzy Bouchard M.D.   On: 11/30/2013 11:42     EKG Interpretation None      MDM   Final diagnoses:  Acute nonintractable headache, unspecified headache type  Cerumen impaction, bilateral    Filed Vitals:   11/30/13 1241 11/30/13  1300 11/30/13 1345 11/30/13 1348  BP: 115/85 115/74  115/74  Pulse:  62 61   Temp:      TempSrc:      Resp: 22 15 12 18   SpO2: 99% 98% 98% 100%    Medications  metoCLOPramide (REGLAN) injection 10 mg (10 mg Intravenous Given 11/30/13 1107)  diphenhydrAMINE (BENADRYL) injection 25 mg (25 mg Intravenous Given 11/30/13 1106)  methylPREDNISolone sodium succinate (SOLU-MEDROL) 125 mg/2 mL injection 125 mg (125 mg Intravenous Given 11/30/13 1107)  sodium chloride 0.9 % bolus 1,000 mL (0 mLs Intravenous Stopped 11/30/13 1209)  methocarbamol (ROBAXIN) 1,000 mg in dextrose 5 % 50 mL IVPB (0 mg Intravenous Duplicate 3/76/28 3151)  docusate (COLACE) 50 MG/5ML liquid 50 mg (50 mg Left Ear Given 11/30/13 1207)    Brandi Chase is a 48 y.o. female presenting with right-sided periorbital headache, is atypical for the patient she does not normally get headaches. There was no thunderclap onset, no neuro signs. Patient  does have mild tenderness palpation on the right temple. There is no arterial induration appreciated. CT is without acute abnormality. HA. Presentation is like pts typical HA and non concerning for [facility], ICH, Meningitis, or temporal arteritis (sedimentation rate is not significantly elevated). Pt is afebrile with no focal neuro deficits, nuchal rigidity, or change in vision. Pt is to follow up with PCP to discuss prophylactic medication. Pt verbalizes understanding and is agreeable with plan to dc.  Patient reports significant improvement after headache cocktail, Robaxin and ear irrigation.  Evaluation does not show pathology that would require ongoing emergent intervention or inpatient treatment. Pt is hemodynamically stable and mentating appropriately. Discussed findings and plan with patient/guardian, who agrees with care plan. All questions answered. Return precautions discussed and outpatient follow up given.   Discharge Medication List as of 11/30/2013  1:56 PM    START taking these  medications   Details  butalbital-acetaminophen-caffeine (FIORICET) 50-325-40 MG per tablet Take 1 tablet by mouth every 6 (six) hours as needed for headache., Starting 11/30/2013, Until Sat 11/30/14, Print    methocarbamol (ROBAXIN) 500 MG tablet Take 2 tablets (1,000 mg total) by mouth every 6 (six) hours as needed (Pain)., Starting 11/30/2013, Until Discontinued, [employer], PA-C 11/30/13 1549

## 2013-11-30 NOTE — ED Notes (Signed)
Pt placed with colace in left ear will recheck in 10 minutes and irrigate

## 2013-11-30 NOTE — Discharge Instructions (Signed)

## 2013-11-30 NOTE — ED Provider Notes (Signed)
Medical screening examination/treatment/procedure(s) were performed by non-physician practitioner and as supervising physician I was immediately available for consultation/collaboration.   EKG Interpretation None        Delice Bison Ward, DO 11/30/13 1620

## 2013-11-30 NOTE — ED Notes (Signed)
Rt ear irrigated large amount of wax removed

## 2013-11-30 NOTE — ED Notes (Signed)
Left ear irrigated. Large amounts of wax removed. Colace placed in rt ear and will soak for 10 minutes and then irrigate as well.

## 2013-11-30 NOTE — ED Notes (Signed)
Pt here for HA which began Wednesday.  Pt was seen at [facility] and her ear was irrigated and she was d/c.  Pt states that she continues to have HA in right side of her head.  Pt is alert and oriented, no neuro deficits

## 2013-11-30 NOTE — Discharge Instructions (Signed)
For breakthrough pain you may take Robaxin. Do not drink alcohol, drive or operate heavy machinery when taking Robaxin. ° °Please follow with your primary care doctor in the next 2 days for a check-up. They must obtain records for further management.  ° °Do not hesitate to return to the Emergency Department for any new, worsening or concerning symptoms.  ° ° ° ° °

## 2013-12-03 ENCOUNTER — Emergency Department (HOSPITAL_COMMUNITY): Payer: No Typology Code available for payment source

## 2013-12-03 ENCOUNTER — Encounter (HOSPITAL_COMMUNITY): Payer: Self-pay | Admitting: Emergency Medicine

## 2013-12-03 ENCOUNTER — Emergency Department (HOSPITAL_COMMUNITY)
Admission: EM | Admit: 2013-12-03 | Discharge: 2013-12-03 | Disposition: A | Discharge status: Home / Self Care | Payer: No Typology Code available for payment source | Attending: Emergency Medicine | Admitting: Emergency Medicine

## 2013-12-03 DIAGNOSIS — H9209 Otalgia, unspecified ear: Secondary | ICD-10-CM | POA: Insufficient documentation

## 2013-12-03 DIAGNOSIS — IMO0002 Reserved for concepts with insufficient information to code with codable children: Secondary | ICD-10-CM | POA: Diagnosis not present

## 2013-12-03 DIAGNOSIS — Z91040 Latex allergy status: Secondary | ICD-10-CM | POA: Diagnosis not present

## 2013-12-03 DIAGNOSIS — Z79899 Other long term (current) drug therapy: Secondary | ICD-10-CM | POA: Diagnosis not present

## 2013-12-03 DIAGNOSIS — M542 Cervicalgia: Secondary | ICD-10-CM | POA: Diagnosis not present

## 2013-12-03 DIAGNOSIS — J45909 Unspecified asthma, uncomplicated: Secondary | ICD-10-CM | POA: Diagnosis not present

## 2013-12-03 DIAGNOSIS — M5030 Other cervical disc degeneration, unspecified cervical region: Secondary | ICD-10-CM | POA: Diagnosis not present

## 2013-12-03 DIAGNOSIS — R51 Headache: Secondary | ICD-10-CM | POA: Insufficient documentation

## 2013-12-03 DIAGNOSIS — Z791 Long term (current) use of non-steroidal anti-inflammatories (NSAID): Secondary | ICD-10-CM | POA: Diagnosis not present

## 2013-12-03 DIAGNOSIS — G4489 Other headache syndrome: Secondary | ICD-10-CM | POA: Insufficient documentation

## 2013-12-03 DIAGNOSIS — Z8742 Personal history of other diseases of the female genital tract: Secondary | ICD-10-CM | POA: Insufficient documentation

## 2013-12-03 DIAGNOSIS — J338 Other polyp of sinus: Secondary | ICD-10-CM | POA: Insufficient documentation

## 2013-12-03 DIAGNOSIS — M47812 Spondylosis without myelopathy or radiculopathy, cervical region: Secondary | ICD-10-CM

## 2013-12-03 MED ORDER — PREDNISONE 20 MG PO TABS: 20.0000 mg | ORAL_TABLET | Freq: Two times a day (BID) | ORAL | Status: DC

## 2013-12-03 MED ORDER — HYDROMORPHONE HCL PF 1 MG/ML IJ SOLN
2.0000 mg | Freq: Once | INTRAMUSCULAR | Status: AC
  Administered 2013-12-03: 2 mg via INTRAMUSCULAR
  Filled 2013-12-03: qty 2

## 2013-12-03 MED ORDER — OXYCODONE-ACETAMINOPHEN 5-325 MG PO TABS: 1.0000 | ORAL_TABLET | ORAL | Status: DC | PRN

## 2013-12-03 MED ORDER — ONDANSETRON 4 MG PO TBDP
8.0000 mg | ORAL_TABLET | Freq: Once | ORAL | Status: AC
  Administered 2013-12-03: 8 mg via ORAL
  Filled 2013-12-03: qty 2

## 2013-12-03 MED ORDER — ONDANSETRON HCL 8 MG PO TABS: 8.0000 mg | ORAL_TABLET | Freq: Three times a day (TID) | ORAL | Status: DC | PRN

## 2013-12-03 MED ORDER — FLUTICASONE PROPIONATE 50 MCG/ACT NA SUSP: 1.0000 | Freq: Every day | NASAL | Status: DC

## 2013-12-03 NOTE — ED Provider Notes (Signed)
CSN: [id number]     Arrival date & time 12/03/13  1431 History   First MD Initiated Contact with Patient 12/03/13 1612     Chief Complaint  Patient presents with  . Headache  . Otalgia     (Consider location/radiation/quality/duration/timing/severity/associated sxs/prior Treatment) HPI  [place] W Breed is a 48 y.o. female who presents for evaluation of right-sided headache. The pain has been present for several days, and persistent. She has a dull, bilateral earache with a sense of muffled sounds. She denies fever or chills, nausea or vomiting. The pain in her right head and radiates to the right neck. She has chronic back pain and receives injections periodically. She has not previously had problems with her neck. She denies cough, shortness of breath, chest pain, weakness, or dizziness. The prescribed medicines, Robaxin, and butalbital, have not helped. There are no other known modifying factors.   Past Medical History  Diagnosis Date  . Lower back pain   . Asthma   . Adnexal cyst 2011    removed  . Hip pain, right    Past Surgical History  Procedure Laterality Date  . Tubal ligation    . Breast reduction surgery     History reviewed. No pertinent family history. History  Substance Use Topics  . Smoking status: Never Smoker   . Smokeless tobacco: Not on file  . Alcohol Use: No   OB History   Grav Para Term Preterm Abortions TAB SAB Ect Mult Living                 Review of Systems  All other systems reviewed and are negative.     Allergies  Other; Primatene mist; Aleve; Avocado; Bactrim ds; Cocoa butter; and Latex  Home Medications   Prior to Admission medications   Medication Sig Start Date End Date Taking? Authorizing Provider  albuterol (PROVENTIL HFA;VENTOLIN HFA) 108 (90 BASE) MCG/ACT inhaler Inhale 2 puffs into the lungs every 6 (six) hours as needed for wheezing or shortness of breath.   Yes Historical Provider, MD  butalbital-acetaminophen-caffeine  (FIORICET) 50-325-40 MG per tablet Take 1 tablet by mouth every 6 (six) hours as needed for headache. 11/30/13 11/30/14 Yes Nicole Pisciotta, PA-C  Calcium Carbonate (CALTRATE 600 PO) Take 600 mg by mouth every evening.    Yes Historical Provider, MD  Cholecalciferol (VITAMIN D) 2000 UNITS tablet Take 2,000 Units by mouth every morning.    Yes Historical Provider, MD  ferrous sulfate 325 (65 FE) MG tablet Take 325 mg by mouth every morning.    Yes Historical Provider, MD  FOLIC ACID PO Take 1 tablet by mouth daily.   Yes Historical Provider, MD  ibuprofen (ADVIL,MOTRIN) 600 MG tablet Take 600 mg by mouth daily as needed for headache or moderate pain.    Yes Historical Provider, MD  magnesium oxide (MAG-OX) 400 MG tablet Take 400 mg by mouth every morning.    Yes Historical Provider, MD  methocarbamol (ROBAXIN) 500 MG tablet Take 2 tablets (1,000 mg total) by mouth every 6 (six) hours as needed (Pain). 11/30/13  Yes Nicole Pisciotta, PA-C  POTASSIUM PO Take 1 tablet by mouth every morning. Over the counter product (does not know strength   Yes Historical Provider, MD  traMADol (ULTRAM) 50 MG tablet Take 50 mg by mouth daily.   Yes Historical Provider, MD  triamcinolone cream (KENALOG) 0.1 % Apply 1 application topically daily as needed (rash).  11/22/13  Yes Historical Provider, MD  fluticasone (FLONASE) 50  MCG/ACT nasal spray Place 1 spray into both nostrils daily. 12/03/13   Richarda Blade, MD  ondansetron (ZOFRAN) 8 MG tablet Take 1 tablet (8 mg total) by mouth every 8 (eight) hours as needed for nausea or vomiting. 12/03/13   Richarda Blade, MD  oxyCODONE-acetaminophen (PERCOCET) 5-325 MG per tablet Take 1 tablet by mouth every 4 (four) hours as needed. 12/03/13   Richarda Blade, MD  predniSONE (DELTASONE) 20 MG tablet Take 1 tablet (20 mg total) by mouth 2 (two) times daily. 12/03/13   Richarda Blade, MD   BP 113/77  Pulse 62  Temp(Src) 97.8 F (36.6 C) (Oral)  Resp 18  SpO2 100%  LMP  11/22/2013 Physical Exam  Nursing note and vitals reviewed. Constitutional: She is oriented to person, place, and time. She appears well-developed and well-nourished.  HENT:  Head: Normocephalic and atraumatic.  External auditory canals, are clear bilaterally. There is minimal dullness of the TMs, bilaterally, with normal landmarks.  Eyes: Conjunctivae and EOM are normal. Pupils are equal, round, and reactive to light.  Neck: Normal range of motion and phonation normal. Neck supple.  No meningismus  Cardiovascular: Normal rate, regular rhythm and intact distal pulses.   Pulmonary/Chest: Effort normal and breath sounds normal. She exhibits no tenderness.  Abdominal: Soft. She exhibits no distension. There is no tenderness. There is no guarding.  Musculoskeletal: Normal range of motion. She exhibits no edema and no tenderness.  Neurological: She is alert and oriented to person, place, and time. She exhibits normal muscle tone.  No dysarthria, dysphasia, or nystagmus.  Skin: Skin is warm and dry.  Psychiatric: She has a normal mood and affect. Her behavior is normal. Judgment and thought content normal.    ED Course  Procedures (including critical care time)  Medications  HYDROmorphone (DILAUDID) injection 2 mg (2 mg Intramuscular Given 12/03/13 1712)  ondansetron (ZOFRAN-ODT) disintegrating tablet 8 mg (8 mg Oral Given 12/03/13 1711)    Patient Vitals for the past 24 hrs:  BP Temp Temp src Pulse Resp SpO2  12/03/13 1848 113/77 mmHg - - 62 - 100 %  12/03/13 1700 110/66 mmHg - - 72 - 98 %  12/03/13 1645 110/76 mmHg - - 69 - 100 %  12/03/13 1630 112/75 mmHg - - 69 - 100 %  12/03/13 1503 119/69 mmHg 97.8 F (36.6 C) Oral 76 18 100 %    6:28 PM Reevaluation with update and discussion. After initial assessment and treatment, an updated evaluation reveals she is more comfortable, now. Findings discussed with the patient, and family members, all questions were answered. [place] Review Labs Reviewed - No data to display  Imaging Review Dg Cervical Spine Complete  12/03/2013   CLINICAL DATA:  Inferior right neck pain for 5 days with no known injury  EXAM: CERVICAL SPINE  4+ VIEWS  COMPARISON:  None.  FINDINGS: Normal alignment with no soft tissue swelling. No fracture. Mild to moderate C5-6 and C6-7 degenerative disc disease. Mild C7-T1 facet arthropathy. Bilateral C3-4 and C4-5 foramina appear somewhat narrowed. Right C6-7 neural foramen also mildly narrowed. Left C5-6 neural foramen mildly narrowed.  IMPRESSION: Degenerative changes.  No acute findings.   Electronically Signed   By: Skipper Cliche M.D.   On: 12/03/2013 18:15     EKG Interpretation None      MDM   Final diagnoses:  Neck pain  Other headache syndrome  Degenerative joint disease of cervical spine  Polyp, sinus maxillary    Multifactorial neck pain and headache. She has significant degenerative joint disease of the cervical spine that is likely causing the neck pain and maybe a secondary cause for headache. 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 Her ear discomfort, likely indicates pressure related sinus congestion. Doubt serious bacterial infection. Metabolic instability, or impending vascular collapse.  Nursing Notes Reviewed/ Care Coordinated Applicable Imaging Reviewed Interpretation of Laboratory Data incorporated into ED treatment  The patient appears reasonably screened and/or stabilized for discharge and I doubt any other medical condition or other [facility] requiring further screening, evaluation, or treatment in the ED at this time prior to discharge.  Plan: Home Medications- see AVS; Home Treatments- rest,m heat; return here if the recommended treatment, does not improve the symptoms; Recommended follow up- PCP 1 week    Richarda Blade, MD 12/03/13 [phone]

## 2013-12-03 NOTE — ED Notes (Signed)
Pt with emesis x 1.  Dr Eulis Foster notified.

## 2013-12-03 NOTE — ED Notes (Signed)
Pt c/o frontal HA and bilateral ear pain; pt seen here Friday for same and had ears irrigated but still not feeling better

## 2013-12-03 NOTE — Discharge Instructions (Signed)
Your neck pain and headache are likely related to your neck, arthritis. You also have sinus polyps that may be contributing to your headache, and ear discomfort. Use heat on the sore area of your neck 3 or 4 times a day. Use of prescribed medicines, as directed. For your headache, and ear discomfort, he may find that Afrin nasal spray, can help. It is safe to use Afrin nasal spray, twice a day as needed for nasal congestion; while you are taking the Flonase nasal spray.     General Headache Without Cause A headache is pain or discomfort felt around the head or neck area. The specific cause of a headache may not be found. There are many causes and types of headaches. A few common ones are:  Tension headaches.  Migraine headaches.  Cluster headaches.  Chronic daily headaches. HOME CARE INSTRUCTIONS   Keep all follow-up appointments with your caregiver or any specialist referral.  Only take over-the-counter or prescription medicines for pain or discomfort as directed by your caregiver.  Lie down in a dark, quiet room when you have a headache.  Keep a headache journal to find out what may trigger your migraine headaches. For example, write down:  What you eat and drink.  How much sleep you get.  Any change to your diet or medicines.  Try massage or other relaxation techniques.  Put ice packs or heat on the head and neck. Use these 3 to 4 times per day for 15 to 20 minutes each time, or as needed.  Limit stress.  Sit up straight, and do not tense your muscles.  Quit smoking if you smoke.  Limit alcohol use.  Decrease the amount of caffeine you drink, or stop drinking caffeine.  Eat and sleep on a regular schedule.  Get 7 to 9 hours of sleep, or as recommended by your caregiver.  Keep lights dim if bright lights bother you and make your headaches worse. SEEK MEDICAL CARE IF:   You have problems with the medicines you were prescribed.  Your medicines are not  working.  You have a change from the usual headache.  You have nausea or vomiting. SEEK IMMEDIATE MEDICAL CARE IF:   Your headache becomes severe.  You have a fever.  You have a stiff neck.  You have loss of vision.  You have muscular weakness or loss of muscle control.  You start losing your balance or have trouble walking.  You feel faint or pass out.  You have severe symptoms that are different from your first symptoms. MAKE SURE YOU:   Understand these instructions.  Will watch your condition.  Will get help right away if you are not doing well or get worse. Document Released: 03/29/2005 Document Revised: 06/21/2011 Document Reviewed: 04/14/2011 [facility] Patient Information 2015 [place], [place]. This information is not intended to replace advice given to you by your health care provider. Make sure you discuss any questions you have with your health care provider.

## 2013-12-11 DIAGNOSIS — R519 Headache, unspecified: Secondary | ICD-10-CM

## 2013-12-11 HISTORY — DX: Headache, unspecified: R51.9

## 2014-08-28 ENCOUNTER — Encounter (HOSPITAL_COMMUNITY)
Admission: RE | Admit: 2014-08-28 | Discharge: 2014-08-28 | Disposition: A | Discharge status: Home / Self Care | Payer: No Typology Code available for payment source | Source: Ambulatory Visit | Attending: Orthopedic Surgery | Admitting: Orthopedic Surgery

## 2014-08-28 ENCOUNTER — Encounter (HOSPITAL_COMMUNITY): Payer: Self-pay

## 2014-08-28 HISTORY — DX: Other complications of anesthesia, initial encounter: T88.59XA

## 2014-08-28 HISTORY — DX: Adverse effect of unspecified anesthetic, initial encounter: T41.45XA

## 2014-08-28 LAB — HCG, SERUM, QUALITATIVE: PREG SERUM: NEGATIVE

## 2014-08-28 LAB — BASIC METABOLIC PANEL
Anion gap: 8 (ref 5–15)
BUN: 13 mg/dL (ref 6–20)
CHLORIDE: 109 mmol/L (ref 101–111)
CO2: 22 mmol/L (ref 22–32)
Calcium: 8.8 mg/dL — ABNORMAL LOW (ref 8.9–10.3)
Creatinine, Ser: 0.74 mg/dL (ref 0.44–1.00)
Glucose, Bld: 94 mg/dL (ref 65–99)
Potassium: 3.8 mmol/L (ref 3.5–5.1)
Sodium: 139 mmol/L (ref 135–145)

## 2014-08-28 LAB — TYPE AND SCREEN
ABO/RH(D): O POS
Antibody Screen: NEGATIVE

## 2014-08-28 LAB — CBC
HCT: 35.2 % — ABNORMAL LOW (ref 36.0–46.0)
Hemoglobin: 11.4 g/dL — ABNORMAL LOW (ref 12.0–15.0)
MCH: 27.5 pg (ref 26.0–34.0)
MCHC: 32.4 g/dL (ref 30.0–36.0)
MCV: 85 fL (ref 78.0–100.0)
PLATELETS: 279 10*3/uL (ref 150–400)
RBC: 4.14 MIL/uL (ref 3.87–5.11)
RDW: 14.6 % (ref 11.5–15.5)
WBC: 12.3 10*3/uL — AB (ref 4.0–10.5)

## 2014-08-28 LAB — SURGICAL PCR SCREEN
MRSA, PCR: NEGATIVE
STAPHYLOCOCCUS AUREUS: NEGATIVE

## 2014-08-28 LAB — ABO/RH: ABO/RH(D): O POS

## 2014-08-28 MED ORDER — VANCOMYCIN HCL IN DEXTROSE 1-5 GM/200ML-% IV SOLN
1000.0000 mg | INTRAVENOUS | Status: AC
  Administered 2014-08-29: 1000 mg via INTRAVENOUS
  Filled 2014-08-28: qty 200

## 2014-08-28 NOTE — Pre-Procedure Instructions (Addendum)
Brandi Chase  08/28/2014   Your procedure is scheduled on:  Aug 29, 2014  Report to [facility] Admitting at 5:30 AM.  Call this number if you have problems the morning of surgery: [phone]   Remember:    [place] not eat food or drink liquids after midnight.   Take these medicines the morning of surgery with A SIP OF WATER: metoprolol tartrate  (LOPRESSOR)   STOP ibuprofen (ADVIL,MOTRIN)    IF NEEDED:albuterol (PROVENTIL HFA;VENTOLIN HFA) 108 (90 BASE) , pain pill   Do not wear jewelry, make-up or nail polish.  Do not wear lotions, powders, or perfumes. You may wear deodorant.  Do not shave 48 hours prior to surgery. Men may shave face and neck.  Do not bring valuables to the hospital.  [facility] is not responsible   for any belongings or valuables.               Contacts, dentures or bridgework may not be worn into surgery.  Leave suitcase in the car. After surgery it may be brought to your room.  For patients admitted to the hospital, discharge time is determined by your    treatment team.               Patients discharged the day of surgery will not be allowed to drive home.  Name and phone number of your driver: FAMILY, FRIEND  Special Instructions: "PREPARING FOR SURGERY"   Please read over the following fact sheets that you were given: Pain Booklet, Coughing and Deep Breathing and Surgical Site Infection Prevention

## 2014-08-29 ENCOUNTER — Inpatient Hospital Stay (HOSPITAL_COMMUNITY): Payer: No Typology Code available for payment source | Admitting: Anesthesiology

## 2014-08-29 ENCOUNTER — Inpatient Hospital Stay (HOSPITAL_COMMUNITY)
Admission: RE | Admit: 2014-08-29 | Discharge: 2014-09-03 | DRG: 460 | Disposition: A | Discharge status: Home / Self Care | Payer: No Typology Code available for payment source | Source: Ambulatory Visit | Attending: Orthopedic Surgery | Admitting: Orthopedic Surgery

## 2014-08-29 ENCOUNTER — Inpatient Hospital Stay (HOSPITAL_COMMUNITY): Payer: No Typology Code available for payment source

## 2014-08-29 ENCOUNTER — Encounter (HOSPITAL_COMMUNITY)
Admission: RE | Disposition: A | Discharge status: Home / Self Care | Payer: Self-pay | Source: Ambulatory Visit | Attending: Orthopedic Surgery

## 2014-08-29 ENCOUNTER — Encounter (HOSPITAL_COMMUNITY): Payer: Self-pay | Admitting: *Deleted

## 2014-08-29 DIAGNOSIS — M4806 Spinal stenosis, lumbar region: Principal | ICD-10-CM | POA: Diagnosis present

## 2014-08-29 DIAGNOSIS — I10 Essential (primary) hypertension: Secondary | ICD-10-CM | POA: Diagnosis present

## 2014-08-29 DIAGNOSIS — M5126 Other intervertebral disc displacement, lumbar region: Secondary | ICD-10-CM | POA: Diagnosis present

## 2014-08-29 DIAGNOSIS — M549 Dorsalgia, unspecified: Secondary | ICD-10-CM | POA: Diagnosis present

## 2014-08-29 DIAGNOSIS — M479 Spondylosis, unspecified: Secondary | ICD-10-CM | POA: Diagnosis present

## 2014-08-29 DIAGNOSIS — Z8249 Family history of ischemic heart disease and other diseases of the circulatory system: Secondary | ICD-10-CM | POA: Diagnosis not present

## 2014-08-29 DIAGNOSIS — Z419 Encounter for procedure for purposes other than remedying health state, unspecified: Secondary | ICD-10-CM

## 2014-08-29 DIAGNOSIS — J45909 Unspecified asthma, uncomplicated: Secondary | ICD-10-CM | POA: Diagnosis present

## 2014-08-29 DIAGNOSIS — Z888 Allergy status to other drugs, medicaments and biological substances status: Secondary | ICD-10-CM | POA: Diagnosis not present

## 2014-08-29 DIAGNOSIS — Z981 Arthrodesis status: Secondary | ICD-10-CM

## 2014-08-29 DIAGNOSIS — M4316 Spondylolisthesis, lumbar region: Secondary | ICD-10-CM | POA: Diagnosis present

## 2014-08-29 HISTORY — DX: Low back pain, unspecified: M54.50

## 2014-08-29 HISTORY — DX: Low back pain: M54.5

## 2014-08-29 HISTORY — DX: Essential (primary) hypertension: I10

## 2014-08-29 HISTORY — DX: Cardiac murmur, unspecified: R01.1

## 2014-08-29 HISTORY — DX: Other chronic pain: G89.29

## 2014-08-29 HISTORY — DX: Anemia, unspecified: D64.9

## 2014-08-29 HISTORY — DX: Unspecified osteoarthritis, unspecified site: M19.90

## 2014-08-29 HISTORY — DX: Headache: R51

## 2014-08-29 HISTORY — DX: Family history of other specified conditions: Z84.89

## 2014-08-29 HISTORY — PX: TRANSFORAMINAL LUMBAR INTERBODY FUSION (TLIF) WITH PEDICLE SCREW FIXATION 2 LEVEL: SHX6142

## 2014-08-29 SURGERY — TRANSFORAMINAL LUMBAR INTERBODY FUSION (TLIF) WITH PEDICLE SCREW FIXATION 2 LEVEL
Anesthesia: General | Site: Spine Lumbar

## 2014-08-29 MED ORDER — HYDROMORPHONE HCL 1 MG/ML IJ SOLN
0.2500 mg | INTRAMUSCULAR | Status: DC | PRN
  Administered 2014-08-29: 0.5 mg via INTRAVENOUS

## 2014-08-29 MED ORDER — ACETAMINOPHEN 10 MG/ML IV SOLN
INTRAVENOUS | Status: AC
  Filled 2014-08-29: qty 100

## 2014-08-29 MED ORDER — SODIUM CHLORIDE 0.9 % IJ SOLN: 3.0000 mL | INTRAMUSCULAR | Status: DC | PRN

## 2014-08-29 MED ORDER — ROCURONIUM BROMIDE 100 MG/10ML IV SOLN
INTRAVENOUS | Status: DC | PRN
  Administered 2014-08-29: 50 mg via INTRAVENOUS

## 2014-08-29 MED ORDER — MORPHINE SULFATE 2 MG/ML IJ SOLN
1.0000 mg | INTRAMUSCULAR | Status: DC | PRN
  Administered 2014-08-29 – 2014-08-30 (×2): 2 mg via INTRAVENOUS
  Filled 2014-08-29 (×2): qty 1

## 2014-08-29 MED ORDER — BUTALBITAL-APAP-CAFFEINE 50-325-40 MG PO TABS: 1.0000 | ORAL_TABLET | Freq: Four times a day (QID) | ORAL | Status: DC | PRN

## 2014-08-29 MED ORDER — ONDANSETRON HCL 4 MG/2ML IJ SOLN
INTRAMUSCULAR | Status: DC | PRN
Start: 2014-08-29 — End: 2014-08-29
  Administered 2014-08-29: 4 mg via INTRAVENOUS

## 2014-08-29 MED ORDER — DEXAMETHASONE SODIUM PHOSPHATE 10 MG/ML IJ SOLN
INTRAMUSCULAR | Status: AC
Start: 2014-08-29 — End: 2014-08-29
  Filled 2014-08-29: qty 1

## 2014-08-29 MED ORDER — PHENYLEPHRINE HCL 10 MG/ML IJ SOLN
10.0000 mg | INTRAMUSCULAR | Status: DC | PRN
  Administered 2014-08-29: 20 ug/min via INTRAVENOUS

## 2014-08-29 MED ORDER — THROMBIN 20000 UNITS EX SOLR
CUTANEOUS | Status: AC
  Filled 2014-08-29: qty 20000

## 2014-08-29 MED ORDER — FENTANYL CITRATE (PF) 100 MCG/2ML IJ SOLN
INTRAMUSCULAR | Status: DC | PRN
  Administered 2014-08-29 (×2): 50 ug via INTRAVENOUS
  Administered 2014-08-29: 150 ug via INTRAVENOUS
  Administered 2014-08-29: 50 ug via INTRAVENOUS

## 2014-08-29 MED ORDER — SODIUM CHLORIDE 0.9 % IJ SOLN
INTRAMUSCULAR | Status: AC
  Filled 2014-08-29: qty 10

## 2014-08-29 MED ORDER — ROCURONIUM BROMIDE 50 MG/5ML IV SOLN
INTRAVENOUS | Status: AC
  Filled 2014-08-29: qty 1

## 2014-08-29 MED ORDER — SUCCINYLCHOLINE CHLORIDE 20 MG/ML IJ SOLN
INTRAMUSCULAR | Status: AC
  Filled 2014-08-29: qty 1

## 2014-08-29 MED ORDER — HEMOSTATIC AGENTS (NO CHARGE) OPTIME
TOPICAL | Status: DC | PRN
  Administered 2014-08-29: 1 via TOPICAL

## 2014-08-29 MED ORDER — FLEET ENEMA 7-19 GM/118ML RE ENEM: 1.0000 | ENEMA | Freq: Once | RECTAL | Status: DC

## 2014-08-29 MED ORDER — BUPIVACAINE-EPINEPHRINE 0.25% -1:200000 IJ SOLN
INTRAMUSCULAR | Status: DC | PRN
  Administered 2014-08-29: 10 mL

## 2014-08-29 MED ORDER — SODIUM CHLORIDE 0.9 % IJ SOLN
3.0000 mL | Freq: Two times a day (BID) | INTRAMUSCULAR | Status: DC
  Administered 2014-08-30: 3 mL via INTRAVENOUS

## 2014-08-29 MED ORDER — ARTIFICIAL TEARS OP OINT
TOPICAL_OINTMENT | OPHTHALMIC | Status: DC | PRN
  Administered 2014-08-29: 1 via OPHTHALMIC

## 2014-08-29 MED ORDER — PROPOFOL 10 MG/ML IV BOLUS
INTRAVENOUS | Status: AC
  Filled 2014-08-29: qty 20

## 2014-08-29 MED ORDER — ONDANSETRON HCL 4 MG/2ML IJ SOLN
4.0000 mg | INTRAMUSCULAR | Status: DC | PRN
  Administered 2014-08-29: 4 mg via INTRAVENOUS
  Filled 2014-08-29: qty 2

## 2014-08-29 MED ORDER — ENSURE ENLIVE PO LIQD
237.0000 mL | Freq: Two times a day (BID) | ORAL | Status: DC
  Administered 2014-09-01 – 2014-09-02 (×4): 237 mL via ORAL

## 2014-08-29 MED ORDER — ACETAMINOPHEN 10 MG/ML IV SOLN: 1000.0000 mg | Freq: Once | INTRAVENOUS | Status: DC | PRN

## 2014-08-29 MED ORDER — DEXAMETHASONE SODIUM PHOSPHATE 4 MG/ML IJ SOLN
4.0000 mg | Freq: Four times a day (QID) | INTRAMUSCULAR | Status: AC
  Administered 2014-08-30 (×2): 4 mg via INTRAVENOUS
  Filled 2014-08-29 (×2): qty 1

## 2014-08-29 MED ORDER — MENTHOL 3 MG MT LOZG
1.0000 | LOZENGE | OROMUCOSAL | Status: DC | PRN
  Administered 2014-08-31: 3 mg via ORAL
  Filled 2014-08-29: qty 9

## 2014-08-29 MED ORDER — MIDAZOLAM HCL 5 MG/5ML IJ SOLN
INTRAMUSCULAR | Status: DC | PRN
  Administered 2014-08-29 (×2): 1 mg via INTRAVENOUS

## 2014-08-29 MED ORDER — LIDOCAINE HCL (CARDIAC) 20 MG/ML IV SOLN
INTRAVENOUS | Status: DC | PRN
  Administered 2014-08-29: 90 mg via INTRAVENOUS

## 2014-08-29 MED ORDER — MIDAZOLAM HCL 2 MG/2ML IJ SOLN
INTRAMUSCULAR | Status: AC
  Filled 2014-08-29: qty 2

## 2014-08-29 MED ORDER — PHENOL 1.4 % MT LIQD: 1.0000 | OROMUCOSAL | Status: DC | PRN

## 2014-08-29 MED ORDER — DEXAMETHASONE SODIUM PHOSPHATE 10 MG/ML IJ SOLN
INTRAMUSCULAR | Status: DC | PRN
  Administered 2014-08-29: 10 mg via INTRAVENOUS

## 2014-08-29 MED ORDER — ACETAMINOPHEN 10 MG/ML IV SOLN
INTRAVENOUS | Status: DC | PRN
  Administered 2014-08-29: 1000 mg via INTRAVENOUS

## 2014-08-29 MED ORDER — OXYCODONE HCL 5 MG PO TABS
ORAL_TABLET | ORAL | Status: AC
  Filled 2014-08-29: qty 2

## 2014-08-29 MED ORDER — OXYCODONE HCL 5 MG PO TABS
10.0000 mg | ORAL_TABLET | ORAL | Status: DC | PRN
  Administered 2014-08-29 – 2014-09-03 (×21): 10 mg via ORAL
  Filled 2014-08-29 (×21): qty 2

## 2014-08-29 MED ORDER — MEPERIDINE HCL 25 MG/ML IJ SOLN: 6.2500 mg | INTRAMUSCULAR | Status: DC | PRN

## 2014-08-29 MED ORDER — FENTANYL CITRATE (PF) 250 MCG/5ML IJ SOLN
INTRAMUSCULAR | Status: AC
  Filled 2014-08-29: qty 5

## 2014-08-29 MED ORDER — ALBUTEROL SULFATE (2.5 MG/3ML) 0.083% IN NEBU: 3.0000 mL | INHALATION_SOLUTION | Freq: Four times a day (QID) | RESPIRATORY_TRACT | Status: DC | PRN

## 2014-08-29 MED ORDER — BUPIVACAINE-EPINEPHRINE (PF) 0.25% -1:200000 IJ SOLN
INTRAMUSCULAR | Status: AC
  Filled 2014-08-29: qty 30

## 2014-08-29 MED ORDER — THROMBIN 20000 UNITS EX SOLR
OROMUCOSAL | Status: DC | PRN
  Administered 2014-08-29: 20 mL via TOPICAL

## 2014-08-29 MED ORDER — VANCOMYCIN HCL IN DEXTROSE 1-5 GM/200ML-% IV SOLN
1000.0000 mg | Freq: Two times a day (BID) | INTRAVENOUS | Status: AC
  Administered 2014-08-29 – 2014-08-30 (×2): 1000 mg via INTRAVENOUS
  Filled 2014-08-29 (×2): qty 200

## 2014-08-29 MED ORDER — LACTATED RINGERS IV SOLN
INTRAVENOUS | Status: DC
  Administered 2014-08-29: 16:00:00 via INTRAVENOUS

## 2014-08-29 MED ORDER — METHOCARBAMOL 1000 MG/10ML IJ SOLN
500.0000 mg | Freq: Four times a day (QID) | INTRAVENOUS | Status: DC | PRN
  Filled 2014-08-29: qty 5

## 2014-08-29 MED ORDER — METOPROLOL TARTRATE 12.5 MG HALF TABLET
12.5000 mg | ORAL_TABLET | Freq: Two times a day (BID) | ORAL | Status: DC
  Administered 2014-08-29 – 2014-09-02 (×9): 12.5 mg via ORAL
  Filled 2014-08-29 (×9): qty 1

## 2014-08-29 MED ORDER — PROPOFOL INFUSION 10 MG/ML OPTIME
INTRAVENOUS | Status: DC | PRN
  Administered 2014-08-29: 25 ug/kg/min via INTRAVENOUS

## 2014-08-29 MED ORDER — PROMETHAZINE HCL 25 MG/ML IJ SOLN: 6.2500 mg | INTRAMUSCULAR | Status: DC | PRN

## 2014-08-29 MED ORDER — SUGAMMADEX SODIUM 200 MG/2ML IV SOLN
INTRAVENOUS | Status: AC
  Filled 2014-08-29: qty 2

## 2014-08-29 MED ORDER — SUGAMMADEX SODIUM 200 MG/2ML IV SOLN
INTRAVENOUS | Status: DC | PRN
  Administered 2014-08-29: 200 mg via INTRAVENOUS

## 2014-08-29 MED ORDER — ONDANSETRON HCL 4 MG/2ML IJ SOLN
INTRAMUSCULAR | Status: AC
  Filled 2014-08-29: qty 2

## 2014-08-29 MED ORDER — LIDOCAINE HCL (CARDIAC) 20 MG/ML IV SOLN
INTRAVENOUS | Status: AC
  Filled 2014-08-29: qty 5

## 2014-08-29 MED ORDER — PHENYLEPHRINE HCL 10 MG/ML IJ SOLN
INTRAMUSCULAR | Status: DC | PRN
  Administered 2014-08-29 (×2): 40 ug via INTRAVENOUS

## 2014-08-29 MED ORDER — PROPOFOL 10 MG/ML IV BOLUS
INTRAVENOUS | Status: DC | PRN
  Administered 2014-08-29: 200 mg via INTRAVENOUS

## 2014-08-29 MED ORDER — STERILE WATER FOR INJECTION IJ SOLN
INTRAMUSCULAR | Status: AC
  Filled 2014-08-29: qty 10

## 2014-08-29 MED ORDER — ARTIFICIAL TEARS OP OINT
TOPICAL_OINTMENT | OPHTHALMIC | Status: AC
  Filled 2014-08-29: qty 3.5

## 2014-08-29 MED ORDER — DEXAMETHASONE 4 MG PO TABS
4.0000 mg | ORAL_TABLET | Freq: Four times a day (QID) | ORAL | Status: AC
  Administered 2014-08-29 – 2014-08-30 (×2): 4 mg via ORAL
  Filled 2014-08-29 (×2): qty 1

## 2014-08-29 MED ORDER — EPHEDRINE SULFATE 50 MG/ML IJ SOLN
INTRAMUSCULAR | Status: AC
  Filled 2014-08-29: qty 1

## 2014-08-29 MED ORDER — ACETAMINOPHEN 500 MG PO TABS
1000.0000 mg | ORAL_TABLET | Freq: Four times a day (QID) | ORAL | Status: AC
  Administered 2014-08-29 – 2014-08-30 (×4): 1000 mg via ORAL
  Filled 2014-08-29 (×4): qty 2

## 2014-08-29 MED ORDER — ACETAMINOPHEN 10 MG/ML IV SOLN: 1000.0000 mg | Freq: Four times a day (QID) | INTRAVENOUS | Status: DC

## 2014-08-29 MED ORDER — HYDROMORPHONE HCL 1 MG/ML IJ SOLN
INTRAMUSCULAR | Status: AC
  Filled 2014-08-29: qty 1

## 2014-08-29 MED ORDER — LACTATED RINGERS IV SOLN
INTRAVENOUS | Status: DC | PRN
  Administered 2014-08-29 (×3): via INTRAVENOUS

## 2014-08-29 MED ORDER — METHOCARBAMOL 500 MG PO TABS
ORAL_TABLET | ORAL | Status: AC
  Filled 2014-08-29: qty 1

## 2014-08-29 MED ORDER — PHENYLEPHRINE 40 MCG/ML (10ML) SYRINGE FOR IV PUSH (FOR BLOOD PRESSURE SUPPORT)
PREFILLED_SYRINGE | INTRAVENOUS | Status: AC
  Filled 2014-08-29: qty 10

## 2014-08-29 MED ORDER — SODIUM CHLORIDE 0.9 % IV SOLN: 250.0000 mL | INTRAVENOUS | Status: DC

## 2014-08-29 MED ORDER — METHOCARBAMOL 500 MG PO TABS
500.0000 mg | ORAL_TABLET | Freq: Four times a day (QID) | ORAL | Status: DC | PRN
  Administered 2014-08-29 – 2014-09-03 (×14): 500 mg via ORAL
  Filled 2014-08-29 (×15): qty 1

## 2014-08-29 MED ORDER — MAGNESIUM CITRATE PO SOLN: 0.5000 | Freq: Once | ORAL | Status: DC

## 2014-08-29 MED ORDER — VECURONIUM BROMIDE 10 MG IV SOLR
INTRAVENOUS | Status: AC
  Filled 2014-08-29: qty 10

## 2014-08-29 SURGICAL SUPPLY — 96 items
BLADE SURG ROTATE 9660 (MISCELLANEOUS) IMPLANT
BUR EGG ELITE 4.0 (BURR) ×1 IMPLANT
BUR EGG ELITE 4.0MM (BURR)
CAGE TLIF XL 8 LUMBAR (Cage) ×1 IMPLANT
CAGE TLIF XL 8MM LUMBAR (Cage) ×1 IMPLANT
CAGE TLIF XLRG 9 LUMBAR (Cage) ×1 IMPLANT
CAGE TLIF XLRG 9MM LUMBAR (Cage) ×1 IMPLANT
CLIP NEUROVISION LG (CLIP) ×2 IMPLANT
CLOSURE STERI-STRIP 1/2X4 (GAUZE/BANDAGES/DRESSINGS) ×2
CLSR STERI-STRIP ANTIMIC 1/2X4 (GAUZE/BANDAGES/DRESSINGS) ×3 IMPLANT
COVER MAYO STAND STRL (DRAPES) IMPLANT
COVER SURGICAL LIGHT HANDLE (MISCELLANEOUS) ×3 IMPLANT
COVER TABLE BACK 60X90 (DRAPES) ×2 IMPLANT
DRAPE C-ARM 42X72 X-RAY (DRAPES) ×3 IMPLANT
DRAPE C-ARMOR (DRAPES) ×3 IMPLANT
DRAPE POUCH INSTRU U-SHP 10X18 (DRAPES) ×3 IMPLANT
DRAPE SURG 17X23 STRL (DRAPES) ×3 IMPLANT
DRAPE U-SHAPE 47X51 STRL (DRAPES) ×3 IMPLANT
DRSG MEPILEX BORDER 4X8 (GAUZE/BANDAGES/DRESSINGS) ×3 IMPLANT
DURAPREP 26ML APPLICATOR (WOUND CARE) ×3 IMPLANT
ELECT BLADE 4.0 EZ CLEAN MEGAD (MISCELLANEOUS)
ELECT BLADE 6.5 EXT (BLADE) ×3 IMPLANT
ELECT CAUTERY BLADE 6.4 (BLADE) ×3 IMPLANT
ELECT PENCIL ROCKER SW 15FT (MISCELLANEOUS) ×3 IMPLANT
ELECT REM PT RETURN 9FT ADLT (ELECTROSURGICAL) ×3
ELECTRODE BLDE 4.0 EZ CLN MEGD (MISCELLANEOUS) IMPLANT
ELECTRODE REM PT RTRN 9FT ADLT (ELECTROSURGICAL) ×1 IMPLANT
GLOVE BIOGEL PI IND STRL 7.0 (GLOVE) IMPLANT
GLOVE BIOGEL PI IND STRL 7.5 (GLOVE) IMPLANT
GLOVE BIOGEL PI IND STRL 8.5 (GLOVE) ×1 IMPLANT
GLOVE BIOGEL PI INDICATOR 7.0 (GLOVE) ×8
GLOVE BIOGEL PI INDICATOR 7.5 (GLOVE) ×2
GLOVE BIOGEL PI INDICATOR 8.5 (GLOVE) ×4
GLOVE SS BIOGEL STRL SZ 8.5 (GLOVE) ×1 IMPLANT
GLOVE SUPERSENSE BIOGEL SZ 8.5 (GLOVE) ×2
GLOVE SURG SS PI 6.5 STRL IVOR (GLOVE) ×2 IMPLANT
GLOVE SURG SS PI 7.0 STRL IVOR (GLOVE) ×6 IMPLANT
GLOVE SURG SS PI 7.5 STRL IVOR (GLOVE) ×2 IMPLANT
GLOVE SURG SS PI 8.5 STRL IVOR (GLOVE) ×6
GLOVE SURG SS PI 8.5 STRL STRW (GLOVE) IMPLANT
GOWN STRL REUS W/ TWL LRG LVL3 (GOWN DISPOSABLE) ×1 IMPLANT
GOWN STRL REUS W/TWL 2XL LVL3 (GOWN DISPOSABLE) ×6 IMPLANT
GOWN STRL REUS W/TWL LRG LVL3 (GOWN DISPOSABLE) ×3
GUIDEWIRE NITINOL BEVEL TIP (WIRE) ×12 IMPLANT
KIT BASIN OR (CUSTOM PROCEDURE TRAY) ×3 IMPLANT
KIT NDL NVM5 EMG ELECT (KITS) IMPLANT
KIT NEEDLE NVM5 EMG ELECT (KITS) ×1 IMPLANT
KIT NEEDLE NVM5 EMG ELECTRODE (KITS) ×2
KIT POSITION SURG JACKSON T1 (MISCELLANEOUS) ×3 IMPLANT
KIT ROOM TURNOVER OR (KITS) ×3 IMPLANT
LIGHT SOURCE ANGLE TIP STR 7FT (MISCELLANEOUS) ×2 IMPLANT
NDL I-PASS III (NEEDLE) IMPLANT
NDL SPNL 18GX3.5 QUINCKE PK (NEEDLE) ×1 IMPLANT
NEEDLE 22X1 1/2 (OR ONLY) (NEEDLE) ×3 IMPLANT
NEEDLE I-PASS III (NEEDLE) ×3 IMPLANT
NEEDLE SPNL 18GX3.5 QUINCKE PK (NEEDLE) ×3 IMPLANT
NS IRRIG 1000ML POUR BTL (IV SOLUTION) ×5 IMPLANT
PACK LAMINECTOMY ORTHO (CUSTOM PROCEDURE TRAY) ×3 IMPLANT
PACK UNIVERSAL I (CUSTOM PROCEDURE TRAY) ×3 IMPLANT
PAD ARMBOARD 7.5X6 YLW CONV (MISCELLANEOUS) ×6 IMPLANT
PATTIES SURGICAL .5 X.5 (GAUZE/BANDAGES/DRESSINGS) IMPLANT
PATTIES SURGICAL .5 X1 (DISPOSABLE) ×3 IMPLANT
POSITIONER HEAD PRONE TRACH (MISCELLANEOUS) ×3 IMPLANT
PROBE BALL TIP NVM5 SNG USE (BALLOONS) ×2 IMPLANT
PUTTY BONE DBX 2.5 MIS (Bone Implant) ×2 IMPLANT
ROD RELINE MAS LORD 5.5X75MM (Rod) ×2 IMPLANT
ROD RELINE TI MAS LD 5.5X65 (Rod) ×2 IMPLANT
SCREW LOCK RELINE 5.5 TULIP (Screw) ×10 IMPLANT
SCREW MAS RELINE 6.5X45 POLY (Screw) ×2 IMPLANT
SCREW MAS RELINE POLY 6.5X40 (Screw) ×2 IMPLANT
SCREW RELINE MAS 7.5X35MM POLY (Screw) ×2 IMPLANT
SCREW SHANK RELINE 6.5X45MM 2C (Screw) ×2 IMPLANT
SCREW SHANK RELINE MOD 7.5X35 (Screw) ×3 IMPLANT
SCREW SHANK RLINE MD 7.5X35 2C (Screw) IMPLANT
SHEET CONFORM 45LX20WX5H (Bone Implant) ×2 IMPLANT
SPINE TULIP RELINE MOD (Neuro Prosthesis/Implant) ×6 IMPLANT
SPONGE LAP 4X18 X RAY DECT (DISPOSABLE) ×8 IMPLANT
SPONGE SURGIFOAM ABS GEL 100 (HEMOSTASIS) ×3 IMPLANT
SURGIFLO TRUKIT (HEMOSTASIS) ×2 IMPLANT
SUT BONE WAX W31G (SUTURE) ×3 IMPLANT
SUT MNCRL AB 3-0 PS2 18 (SUTURE) ×6 IMPLANT
SUT VIC AB 0 CT1 27 (SUTURE) ×3
SUT VIC AB 0 CT1 27XBRD ANBCTR (SUTURE) IMPLANT
SUT VIC AB 1 CT1 18XCR BRD 8 (SUTURE) ×1 IMPLANT
SUT VIC AB 1 CT1 8-18 (SUTURE) ×12
SUT VIC AB 1 CTX 36 (SUTURE) ×6
SUT VIC AB 1 CTX36XBRD ANBCTR (SUTURE) ×2 IMPLANT
SUT VIC AB 2-0 CT1 18 (SUTURE) ×3 IMPLANT
SYR BULB IRRIGATION 50ML (SYRINGE) ×3 IMPLANT
SYR CONTROL 10ML LL (SYRINGE) ×3 IMPLANT
TOWEL OR 17X24 6PK STRL BLUE (TOWEL DISPOSABLE) ×3 IMPLANT
TOWEL OR 17X26 10 PK STRL BLUE (TOWEL DISPOSABLE) ×3 IMPLANT
TRAY FOLEY CATH 16FRSI W/METER (SET/KITS/TRAYS/PACK) ×3 IMPLANT
TULIP SPINE RELINE MOD (Neuro Prosthesis/Implant) IMPLANT
WATER STERILE IRR 1000ML POUR (IV SOLUTION) ×3 IMPLANT
YANKAUER SUCT BULB TIP NO VENT (SUCTIONS) ×3 IMPLANT

## 2014-08-29 NOTE — Brief Op Note (Signed)
08/29/2014  1:37 PM  PATIENT:  Brandi Chase  49 y.o. 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  PROCEDURE:  Procedure(s): TRANSFORAMINAL LUMBAR INTERBODY FUSION (TLIF) L4-S1 (N/A)  SURGEON:  Surgeon(s) and Role:    * Melina Schools, MD - Primary  PHYSICIAN ASSISTANT:   ASSISTANTS: none   ANESTHESIA:   general  EBL:  Total I/O In: 2000 [I.V.:2000] Out: 700 [Urine:400; Blood:300]  BLOOD ADMINISTERED:none  DRAINS: none   LOCAL MEDICATIONS USED:  MARCAINE     SPECIMEN:  No Specimen  DISPOSITION OF SPECIMEN:  N/A  COUNTS:  YES  TOURNIQUET:  * No tourniquets in log *  DICTATION: .Other Dictation: Dictation Number [id number]  PLAN OF CARE: Admit to inpatient   PATIENT DISPOSITION:  PACU - hemodynamically stable.

## 2014-08-29 NOTE — Progress Notes (Signed)
Brandi Chase is a 49 y.o. female patient admitted from ED awake, alert - oriented  X 3 - no acute distress noted.  VSS - Blood pressure 153/82, pulse 86, temperature 98.4 F (36.9 C), temperature source Oral, resp. rate 16, weight 92.08 kg (203 lb), last menstrual period 08/08/2014, SpO2 96 %.    IV in place, occlusive dsg intact without redness.  Orientation to room, and floor completed with information packet given to patient/family.  Patient declined safety video at this time.  Admission INP armband ID verified with patient/family, and in place.   SR up x 2, fall assessment complete, with patient and family able to verbalize understanding of risk associated with falls, and verbalized understanding to call nsg before up out of bed.  Call light within reach, patient able to voice, and demonstrate understanding.  No evidence of skin break down noted on exam. Foam dressing to incision on back clean dry and intact.    Patient refused enema order r/t her having a bowel movement yesterday.   Will cont to eval and treat per MD orders.  Janalyn Shy, RN 08/29/2014 4:15 PM

## 2014-08-29 NOTE — Anesthesia Procedure Notes (Signed)
Procedure Name: Intubation Date/Time: 08/29/2014 7:40 AM Performed by: Suzy Bouchard Pre-anesthesia Checklist: Patient identified, Timeout performed, Emergency Drugs available, Suction available and Patient being monitored Patient Re-evaluated:Patient Re-evaluated prior to inductionOxygen Delivery Method: Circle system utilized Preoxygenation: Pre-oxygenation with 100% oxygen Intubation Type: IV induction Ventilation: Oral airway inserted - appropriate to patient size and Mask ventilation without difficulty Laryngoscope Size: Miller and 2 Grade View: Grade I Tube type: Oral Tube size: 7.0 mm Number of attempts: 1 Airway Equipment and Method: Stylet Placement Confirmation: ETT inserted through vocal cords under direct vision,  breath sounds checked- equal and bilateral and positive ETCO2 Secured at: 21 cm Tube secured with: Tape Dental Injury: Teeth and Oropharynx as per pre-operative assessment

## 2014-08-29 NOTE — Anesthesia Postprocedure Evaluation (Signed)
Anesthesia Post Note  Patient: Brandi Chase  Procedure(s) Performed: Procedure(s) (LRB): TRANSFORAMINAL LUMBAR INTERBODY FUSION (TLIF) L4-S1 (N/A)  Anesthesia type: General  Patient location: PACU  Post pain: Pain level controlled  Post assessment: Post-op Vital signs reviewed  Last Vitals: BP 153/82 mmHg  Pulse 86  Temp(Src) 36.9 C (Oral)  Resp 16  Wt 203 lb (92.08 kg)  SpO2 96%  LMP 08/08/2014  Post vital signs: Reviewed  Level of consciousness: sedated  Complications: No apparent anesthesia complications

## 2014-08-29 NOTE — H&P (Signed)
History of Present Illness The patient is a 49 year old female who presents to the practice today for a transition into care. The patient is transitioning into care from another physician (Dr Suella Broad who has referred pt for back pain) .  Additional reasons for visit:  Follow-up back is described as the following: The patient is being followed for their back pain. They are now 12 year(s) ("but has gotten worse in the past 2 years") out. Symptoms reported today include: pain, aching, throbbing and leg pain (Rt.leg , right hip and burning upper right anterior thigh and anterior lower leg). The patient states that they are doing poorly. Current treatment includes: activity modification, NSAIDs and pain medications. The following medication has been used for pain control: antiinflammatory medication (Ibuprofen 600mg  bid) and Ultram (Tramadol 50mg  bid). The patient reports their current pain level to be 6 / 10. The patient presents today following MRI (that was done at [facility]).  Allergies Primatene Asthma *COUGH/COLD/ALLERGY*  Family History Rheumatoid Arthritis mother Cancer First Degree Relatives. father Congestive Heart Failure First Degree Relatives. mother Hypertension mother and father Osteoarthritis mother Osteoporosis mother  Social History  Drug/Alcohol Rehab (Previously) no Current work status working full time Drug/Alcohol Rehab (Currently) no Children 3 Illicit drug use no Marital status single Number of flights of stairs before winded 2-3 Exercise Exercises daily; does running / walking Tobacco / smoke exposure yes Tobacco use Never smoker. never smoker; 04-18-14 Alcohol use never consumed alcohol 04-18-14  Medication History  TraMADol HCl (50MG  Tablet, 1 (one) Oral three times daily, as needed for pain, Taken starting 07/01/2014) Active. Ibuprofen (600MG  Tablet, Oral) Active. Multivitamin & Mineral (Oral) Active. Medications Reconciled   Objective  Transcription She is a pleasant woman who appears younger than her stated age. She is alert. She is oriented x3. She has significant back pain with extension of the spine, in fact she cannot go past neutral. She has actually relief with forward flexion. No obvious skin lesions, abrasions, or contusions to the lumbar spine. No shortness of breath or chest pain. The abdomen is soft and nontender. No history incontinence of bowel and bladder. Negative rebound tenderness. She does have bilateral groin/hip pain with external and internal rotation, but it is not severe and it is not debilitating. No knee, no ankle pain. Compartments are soft and nontender. She has got 5/5 EHL, tibialis anterior and gastrocnemius strength. She has numbness and dysesthesias going down the outer aspect of the calf into the top and bottom of the foot. She has symmetrical 1+ deep tendon reflexes. Negative Babinski. No clonus. Negative straight leg raise test. She has significant neuropathic pain, which she is localizing to the L5 and S1 dermatomes.  Clinical treatment to date has included prolonged formalized physical therapy in 2014 with self-directed exercise throughout 2015, four previous lumbar epidural injections with initially excellent response, but now decreasing efficacy. She has also had activity modification, narcotic and non-narcotic medications, and yet her quality of life continues to deteriorate.  Her MRI and x-rays were reviewed. She has an MRI from 06/04/2014. At L4-L5, she has got a grade 1 spondylolisthesis with facet arthrosis, moderate right foraminal disk protrusion. At L5-S1, she has a central disk extrusion which does contact the S1 nerve root, but does not displace it, foraminal protrusion with severe left foraminal stenosis, and asymmetrical mass effect on the left L5 dorsal root ganglion.  X-rays confirmed the grade 1 spondylolisthesis at L4-L5 and the advanced degenerative disk disease at L1-S1, more  prominent than what was seen on the MRI.  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 At this point despite appropriate conservative management, her overall quality of life continues to suffer.  PLAN We have discussed surgical options. At this point, I think the L4-L5 and L5-S1 levels are her truly symptomatic levels. To address the neuropathic leg pain would require decompression at L4-L5 and L5-S1 on that right side. Because of the increasing likelihood of the slip worsening, it would then require supplemental fixation at L4-L5. Because of the advanced degenerative disk disease and the need for a fusion at L4-L5, I would recommend extending this down to L5-S1, so we could stabilize this level and prevent adjacent segment degeneration that would become symptomatic. While there is facet arthrosis at L3-L4, it is mostly on the left side. She is having no left radicular leg pain and she got near complete relief with injections of the L4 and L5 levels. I have reviewed the risks with her, which include infection, bleeding, nerve damage, death, stroke, paralysis, ongoing or worse pain, need for further surgery, adjacent segment collapse, hardware failure, and hardware complications. She has expressed an understanding of the risks. I have asked her to think about her options and to call me and let me know how she would like to proceed.

## 2014-08-29 NOTE — Transfer of Care (Signed)
Immediate Anesthesia Transfer of Care Note  Patient: Brandi Chase  Procedure(s) Performed: Procedure(s): TRANSFORAMINAL LUMBAR INTERBODY FUSION (TLIF) L4-S1 (N/A)  Patient Location: PACU  Anesthesia Type:General  Level of Consciousness: awake and alert   Airway & Oxygen Therapy: Patient Spontanous Breathing and Patient connected to nasal cannula oxygen  Post-op Assessment: Report given to RN, Post -op Vital signs reviewed and stable and Patient moving all extremities  Post vital signs: Reviewed and stable  Last Vitals:  Filed Vitals:   08/29/14 0620  BP: 139/82  Pulse: 75  Temp: 36.9 C  Resp: 20    Complications: No apparent anesthesia complications

## 2014-08-29 NOTE — Anesthesia Preprocedure Evaluation (Addendum)
Anesthesia Evaluation  Patient identified by MRN, date of birth, ID band Patient awake    Reviewed: Allergy & Precautions, NPO status , Patient's Chart, lab work & pertinent test results  History of Anesthesia Complications Negative for: history of anesthetic complications  Airway Mallampati: II  TM Distance: >3 FB Neck ROM: Full    Dental no notable dental hx. (+) Teeth Intact, Dental Advisory Given   Pulmonary asthma ,  breath sounds clear to auscultation  Pulmonary exam normal       Cardiovascular hypertension, Pt. on home beta blockers Normal cardiovascular examRhythm:Regular Rate:Normal     Neuro/Psych negative neurological ROS  negative psych ROS   GI/Hepatic negative GI ROS, Neg liver ROS,   Endo/Other  negative endocrine ROS  Renal/GU negative Renal ROS     Musculoskeletal negative musculoskeletal ROS (+)   Abdominal (+) + obese,   Peds  Hematology negative hematology ROS (+)   Anesthesia Other Findings   Reproductive/Obstetrics negative OB ROS                            Anesthesia Physical Anesthesia Plan  ASA: II  Anesthesia Plan: General   Post-op Pain Management:    Induction: Intravenous  Airway Management Planned: Oral ETT  Additional Equipment: None  Intra-op Plan:   Post-operative Plan: Extubation in OR  Informed Consent: I have reviewed the patients History and Physical, chart, labs and discussed the procedure including the risks, benefits and alternatives for the proposed anesthesia with the patient or authorized representative who has indicated his/her understanding and acceptance.   Dental advisory given  Plan Discussed with: CRNA  Anesthesia Plan Comments:         Anesthesia Quick Evaluation

## 2014-08-30 ENCOUNTER — Inpatient Hospital Stay (HOSPITAL_COMMUNITY): Payer: No Typology Code available for payment source

## 2014-08-30 MED ORDER — MAGNESIUM CITRATE PO SOLN
1.0000 | Freq: Once | ORAL | Status: AC
  Administered 2014-08-30: 1 via ORAL
  Filled 2014-08-30: qty 296

## 2014-08-30 MED ORDER — OXYCODONE HCL 5 MG PO TABS: 5.0000 mg | ORAL_TABLET | ORAL | Status: DC | PRN

## 2014-08-30 MED ORDER — METHOCARBAMOL 500 MG PO TABS: 500.0000 mg | ORAL_TABLET | Freq: Four times a day (QID) | ORAL | Status: DC | PRN

## 2014-08-30 NOTE — Progress Notes (Signed)
PT Cancellation Note  Patient Details Name: Brandi Chase MRN: [id number] DOB: 1965/07/19   Cancelled Treatment:    Reason Eval/Treat Not Completed: Patient declined, no reason specified Pt just given pain medication and refused to participate in therapy due to feeling groggy. Will follow up next available time.   [place] 08/30/2014, 10:47 AM Wray Kearns, PT, DPT [phone]

## 2014-08-30 NOTE — Evaluation (Signed)
Occupational Therapy Evaluation Patient Details Name: Brandi Chase MRN: [id number] DOB: 06-06-1965 Today's Date: 08/30/2014    History of Present Illness Patient is a 49 y/o female s/p L4-S1 TLIF. PMH of HTN, asthma, heart murmur.   Clinical Impression   PTA pt lived at home and was independent with ADLs. Pt is limited currently by surgical pain and decreased ROM due to back precautions. She requires min guard assist for functional mobility and assist for LB ADLs. Pt will benefit from acute OT to progress to Supervision level to return home with family support.     Follow Up Recommendations  No OT follow up    Equipment Recommendations  None recommended by OT    Recommendations for Other Services       Precautions / Restrictions Precautions Precautions: Fall;Back Precaution Booklet Issued: Yes (comment) Precaution Comments: Reviewed handout and precautions. Required Braces or Orthoses: Spinal Brace Spinal Brace: Lumbar corset;Applied in sitting position Restrictions Weight Bearing Restrictions: No      Mobility Bed Mobility Overal bed mobility: Needs Assistance Bed Mobility: Rolling;Sidelying to Sit Rolling: Min assist Sidelying to sit: Min guard;HOB elevated       General bed mobility comments: Cues for log roll technique. Min A to roll to right and scoot bottom to EOB, able to elevate trunk without assist.   Transfers Overall transfer level: Needs assistance Equipment used: Rolling walker (2 wheeled) Transfers: Sit to/from Stand Sit to Stand: Min guard         General transfer comment: Min guard for safety. Stood from [employer], from toilet x1.     Balance Overall balance assessment: Needs assistance Sitting-balance support: Feet supported;No upper extremity supported Sitting balance-Leahy Scale: Good Sitting balance - Comments: ABle to donn/doff LSO sitting EOB without assist.    Standing balance support: During functional activity Standing  balance-Leahy Scale: Fair Standing balance comment: Tolerated dynamic standing- washing hands without UE support for short periods.                            ADL Overall ADL's : Needs assistance/impaired Eating/Feeding: Independent;Sitting   Grooming: Min guard;Wash/dry hands;Standing   Upper Body Bathing: Set up;Sitting   Lower Body Bathing: Minimal assistance;Sit to/from stand   Upper Body Dressing : Set up;Sitting   Lower Body Dressing: Moderate assistance;Sit to/from stand   Toilet Transfer: Min guard;Ambulation;RW;Comfort height toilet;Grab bars   Toileting- Clothing Manipulation and Hygiene: Min guard;Sit to/from stand       Functional mobility during ADLs: Min guard;Rolling walker General ADL Comments: Pt recalled 2/3 back precautions and educated pt on fall prevention and incorporating back precautions into ADLs.      Vision Additional Comments: No change from baseline          Pertinent Vitals/Pain Pain Assessment: 0-10 Pain Score: 3  Pain Location: back/right hip Pain Descriptors / Indicators: Aching;Sore Pain Intervention(s): Monitored during session;Repositioned     Hand Dominance Right   Extremity/Trunk Assessment Upper Extremity Assessment Upper Extremity Assessment: Overall WFL for tasks assessed   Lower Extremity Assessment Lower Extremity Assessment: Defer to PT evaluation   Cervical / Trunk Assessment Cervical / Trunk Assessment: Normal   Communication Communication Communication: No difficulties   Cognition Arousal/Alertness: Awake/alert Behavior During Therapy: WFL for tasks assessed/performed Overall Cognitive Status: Within Functional Limits for tasks assessed       Memory: Decreased recall of precautions  Home Living Family/patient expects to be discharged to:: Private residence ("parent's home") Living Arrangements: Other relatives;Parent Available Help at Discharge: Family;Available  24 hours/day ("someone is always there"; 9 siblings, and 3 children) Type of Home: House Home Access: Stairs to enter [employer] of Steps: 3 Entrance Stairs-Rails: Right Home Layout: One level     Bathroom Shower/Tub: Tub/shower unit Shower/tub characteristics: Curtain Biochemist, clinical: Standard     Home Equipment: Environmental consultant - 2 wheels;Shower seat          Prior Functioning/Environment Level of Independence: Independent             OT Diagnosis: Generalized weakness;Acute pain   OT Problem List: Decreased strength;Decreased range of motion;Decreased activity tolerance;Impaired balance (sitting and/or standing);Decreased knowledge of use of DME or AE;Decreased knowledge of precautions;Pain   OT Treatment/Interventions: Self-care/ADL training;Therapeutic exercise;Energy conservation;DME and/or AE instruction;Therapeutic activities;Patient/family education;Balance training    OT Goals(Current goals can be found in the care plan section) Acute Rehab OT Goals Patient Stated Goal: to get better OT Goal Formulation: With patient Time For Goal Achievement: 09/13/14 Potential to Achieve Goals: Good ADL Goals Pt Will Perform Grooming: with supervision;standing Pt Will Perform Lower Body Bathing: with supervision;sit to/from stand Pt Will Perform Lower Body Dressing: with supervision;sit to/from stand Pt Will Transfer to Toilet: with supervision;ambulating Pt Will Perform Toileting - Clothing Manipulation and hygiene: with supervision;sit to/from stand Pt Will Perform Tub/Shower Transfer: Tub transfer;with supervision;ambulating;rolling walker  OT Frequency: Min 2X/week           Co-evaluation PT/OT/SLP Co-Evaluation/Treatment: Yes Reason for Co-Treatment: For patient/therapist safety (to be able to get both therapies today) PT goals addressed during session: Mobility/safety with mobility;Proper use of DME OT goals addressed during session: ADL's and self-care       End of Session Equipment Utilized During Treatment: Gait belt;Rolling walker;Back brace Nurse Communication: Mobility status  Activity Tolerance: Patient tolerated treatment well Patient left: in chair;with call bell/phone within reach   Time: 4481-8563 OT Time Calculation (min): 32 min Charges:  OT General Charges $OT Visit: 1 Procedure OT Evaluation $Initial OT Evaluation Tier I: 1 Procedure G-Codes:    Juluis Rainier 2014/09/01, 2:53 PM  Cyndie Chime, OTR/L Occupational Therapist [phone] (pager)

## 2014-08-30 NOTE — Progress Notes (Addendum)
Nutrition Brief Note  Patient identified on the Malnutrition Screening Tool (MST) Report  Wt Readings from Last 15 Encounters:  08/29/14 203 lb (92.08 kg)  05/24/13 217 lb 4.8 oz (98.567 kg)  05/04/13 273 lb (123.832 kg)  02/13/12 235 lb (106.595 kg)  03/02/11 235 lb (106.595 kg)  12/11/10 234 lb (106.142 kg)  11/30/10 238 lb 9.6 oz (108.228 kg)  11/26/10 235 lb 9.6 oz (106.867 kg)  11/09/10 234 lb 9.6 oz (106.414 kg)  09/28/10 234 lb (106.142 kg)  09/22/10 234 lb 6.4 oz (106.323 kg)  08/24/10 236 lb 4.8 oz (107.185 kg)  02/19/10 232 lb (105.235 kg)  10/14/09 234 lb 0.5 oz (106.156 kg)  09/24/09 233 lb 14.4 oz (106.096 kg)   S/p Procedure(s) in 08/29/14: TRANSFORAMINAL LUMBAR INTERBODY FUSION (TLIF) L4-S1 (N/A)  Pt reports no changes to her appetite or diet PTA. She reports she consumed about 50% of her lunch. She endorses weight loss over the past year, which she attributes to being more active at her job. Nutrition-focused physical exam revealed no signs of fat or muscle depletion.  She reports that she is anemic (which is a chronic problem- she takes an iron supplement at home); reviewed sources of iron-rich foods. She denies any further nutrition needs at this time, but expressed appreciation for visit.   Body mass index is 37.12 kg/(m^2). Patient meets criteria for obesity, class II based on current BMI.   Current diet order is regular, patient is consuming approximately 50-75% of meals at this time. Labs and medications reviewed.   No nutrition interventions warranted at this time. If nutrition issues arise, please consult RD.   Miner Koral A. Jimmye Norman, RD, LDN, CDE Pager: [phone] After hours Pager: [phone]

## 2014-08-30 NOTE — Op Note (Signed)
NAMECAMRIN, LAPRE           ACCOUNT NO.:  [id number]  MEDICAL RECORD NO.:  [id number]  LOCATION:  5N32C                        FACILITY:  [facility]  PHYSICIAN:  Javonni Macke D. Rolena Infante, M.D. DATE OF BIRTH:  [birth year]  DATE OF PROCEDURE:  08/29/2014 DATE OF DISCHARGE:                              OPERATIVE REPORT   PREOPERATIVE DIAGNOSES:  Spondylolisthesis with degenerative spinal stenosis and degenerative disk disease at L5-S1 and L4-5.  POSTOPERATIVE DIAGNOSES:  Spondylolisthesis with degenerative spinal stenosis and degenerative disk disease at L5-S1 and L4-5.  OPERATIVE PROCEDURES:  Transforaminal lumbar interbody fusion at L4-5 and L5-S1 (Gill decompression at L4-5 and L5-S1 with complete laminectomy of L5, partial laminotomy of L4, removal of disk at L4-5 and L5-S1 with insertion of biomechanical intervertebral device at L4-5 and L5-S1, segmental pedicle screw fixation at L4-5 and L5-S1).  COMPLICATIONS:  None.  CONDITION:  Stable.  HISTORY:  This is a very pleasant 49 year old woman who has been having severe back, buttock and radicular right leg pain.  Attempts at conservative management have failed to alleviate her symptoms.  As a result, we elected to proceed with surgery.  All appropriate risks, benefits, and alternatives were discussed with the patient and consent was obtained.  OPERATIVE NOTE:  The patient was brought to the operating room and placed supine on the operating table.  After successful induction of general anesthesia and endotracheal intubation, TEDs, SCDs, and Foley were inserted.  The patient was turned prone onto the Wilson frame and all bony prominences were well padded.  I then inserted the needles into the lower extremity for intraoperative SSEP and EMG neuro monitoring. Once this was done, the back was prepped and draped in a standard fashion.  Time-out was taken confirming patient, procedure and all other pertinent important data.  Once this  was done on the left-hand side, I identified the lateral border of the L4 pedicle and made a small incision and advanced the Jamshidi needle down to the lateral border of the facet complex.  I then advanced this Jamshidi needle through the L4 pedicle and into the vertebral body of L4.  I confirmed trajectory in both the AP and lateral planes with fluoroscopy and there were no adverse neuromonitoring events.  There was no abnormal EMG activity with insertion of the Jamshidi.  I then placed the guidepin through the Jamshidi and removed it.  With the L4 pedicle cannula, I repeated this exact procedure at L5 and S1.  Because of the close proximity of the screws, I was able to do this through a small Wiltse single incision. Once all the guidepins were in place, I then tapped over each guidepin and placed an appropriate-sized pedicle screw.  At L4, I placed a 45-mm length 6.5 diameter screw.  At L5, I placed a 40-mm length 6.5 diameter screw.  At S1, I placed a 35-mm length 7.5 diameter screw.  All screws were then directly stimulated and there was no abnormal EMG activity.  With pedicle screws in the left side, I went to the right side.  Again, a Wiltse incision was made about 3 fingerbreadths off the midline. Sharp dissection was carried out down to the deep fascia, I incised the deep fascia.  I then bluntly dissected through the paraspinal muscles to the lateral aspect of the facet complex.  Using the same technique that I had used on the contralateral side, I placed the Jamshidi needle through the pedicle and into the vertebral body at L4 and at S1.  I then cannulated both pedicles and then placed the appropriate-sized pedicle screws at these levels.  These screws were then attached to the retracting blades and then the retracting device was established.  I now had good visualization of the posterior aspect of the spine.  I then used a Cobb curette to expose the lamina of L4 and L5.  Once  this was done, now I could use my medial retracting blade to retract the paraspinal muscles medially and I had good exposure.  At this point, I then used my 3-mm Kerrison and performed a generous laminotomy of L5.  I then used osteotomes to remove the inferior L5 facet.  It was significantly arthritic and grossly deformed from the arthritic changes.  With the facet gone and majority of the lamina gone, I then turned my attention to L4.  A generous laminotomy was again performed using a 3-mm Kerrison punch and I removed the inferior L4 facet itself.  I then identified the L4 nerve root in the pars of L4 and completed the pars resection again using a 2 and 3-mm Kerrison punch.  I then went inferiorly and began resecting the remaining portion of the L5 lamina until I had a complete laminectomy of L5.  I now had excellent visualization of the posterolateral aspect of the thecal sac, the L4 nerve root, the L5 nerve root and even the S1 nerve root.  I then used my [facility] and palpated the inferomedial and superior aspect of the S1 pedicle.  There was no evidence of breach.  I also had excellent foraminal decompression at S1.  I had a complete foraminectomy at L5 and L4 with no further compression of the L5 or L4 nerve root.  At this point, I then turned my attention to the diskectomy.  The nerve roots were protected with the neural patties and I incised the disk space with a 15-blade scalpel.  Then using a combination of pituitary rongeurs, curettes, and Kerrison rongeurs, I removed all of the disk material from the L4-5 disk space level.  I scraped the endplates to ensure I had bleeding subchondral bone.  I then placed the CONFORM allograft sheath along the anterior annulus, packed a size 9 extra-long Titan cage with local bone from decompression/DBX mix.  I malleted this to the appropriate depth and then made sure took a horizontal path in the anterior third of the disk space.  I  had good positioning of the cage.  It was firmly placed.  There was no compression either centrally or in the foramen from the cage.  I then reset my retractor and using the same technique, I did at L4-5, and performed a complete diskectomy at L5-S1 with insertion of a size 8 extra-long Titan cage, again packed with local bone plus DBX mix.  At this point, I then attached the polyaxial heads to the screws on the right side, measured and then placed the appropriate-sized rod.  The rod was then torqued off.  The locking cuts were locked down according to manufacture's standards.  I irrigated this wound copiously with normal saline and made sure I had hemostasis using bipolar electrocautery and FloSeal.  After final irrigation, I placed the long thrombin Gelfoam  patty over the exposed thecal sac and then closed the deep fascia with interrupted #1 Vicryl sutures, superficial with 2-0 Vicryl sutures, and a 3-0 Monocryl for the skin.  I then went to the left-hand side, measured and placed a 65-mm length screw on this side.  Once the screw had been passed, I then passed the locking caps and torqued them off according to manufacture's standards.  I then irrigated this side and closed in a similar fashion.  Steri- Strips and dry dressing were applied.  The patient was ultimately extubated and transferred to the PACU.  At the end of the case, all needle and sponge counts were correct.  The patient was neurovascularly intact.     Karilyn Wind D. Rolena Infante, M.D.     DDB/MEDQ  D:  08/29/2014  T:  08/30/2014  Job:  [id number]

## 2014-08-30 NOTE — Evaluation (Signed)
Physical Therapy Evaluation Patient Details Name: DEMONICA FARREY MRN: [id number] DOB: [birth year] Today's Date: 08/30/2014   History of Present Illness  Patient is a 49 y/o female s/p L4-S1 TLIF. PMH of HTN, asthma, heart murmur.  Clinical Impression  Patient presents with pain, generalized weakness and balance deficits impacting mobility. Provided education on back precautions. Tolerated ambulation with min guard assist for safety. Pt will have 24/7 S at home from family. Will need to perform stair negotiation prior to d/c. Will continue to follow to maximize independence.     Follow Up Recommendations No PT follow up;Supervision/Assistance - 24 hour    Equipment Recommendations  None recommended by PT    Recommendations for Other Services       Precautions / Restrictions Precautions Precautions: Fall;Back Precaution Booklet Issued: Yes (comment) Precaution Comments: Reviewed handout and precautions. Required Braces or Orthoses: Spinal Brace Spinal Brace: Lumbar corset;Applied in sitting position Restrictions Weight Bearing Restrictions: No      Mobility  Bed Mobility Overal bed mobility: Needs Assistance Bed Mobility: Rolling;Sidelying to Sit Rolling: Min assist Sidelying to sit: Min guard;HOB elevated       General bed mobility comments: Cues for log roll technique. Min A to roll to right and scoot bottom to EOB, able to elevate trunk without assist.   Transfers Overall transfer level: Needs assistance Equipment used: Rolling walker (2 wheeled) Transfers: Sit to/from Stand Sit to Stand: Min guard         General transfer comment: Min guard for safety. Stood from [employer], from toilet x1.   Ambulation/Gait Ambulation/Gait assistance: Min guard Ambulation Distance (Feet): 150 Feet Assistive device: Rolling walker (2 wheeled) Gait Pattern/deviations: Step-through pattern;Decreased stride length;Trunk flexed   Gait velocity interpretation: Below normal  speed for age/gender General Gait Details: Cues to relax shoulders and for upright posture.   Stairs            Wheelchair Mobility    Modified Rankin (Stroke Patients Only)       Balance Overall balance assessment: Needs assistance Sitting-balance support: Feet supported;No upper extremity supported Sitting balance-Leahy Scale: Good Sitting balance - Comments: ABle to donn/doff LSO sitting EOB without assist.    Standing balance support: During functional activity Standing balance-Leahy Scale: Fair Standing balance comment: Tolerated dynamic standing- washing hands without UE support for short periods.                             Pertinent Vitals/Pain Pain Assessment: 0-10 Pain Score: 3  Pain Location: back/right hip Pain Descriptors / Indicators: Sore;Aching Pain Intervention(s): Monitored during session;Repositioned    Home Living Family/patient expects to be discharged to:: Private residence ("parent's home.") Living Arrangements: Other relatives;Parent Available Help at Discharge: Family;Available 24 hours/day ("someone is always there." 9 brothers and sisters and 3 children) Type of Home: House Home Access: Stairs to enter Entrance Stairs-Rails: Right Entrance Stairs-Number of Steps: 3 Home Layout: One level Home Equipment: Walker - 2 wheels      Prior Function Level of Independence: Independent               Hand Dominance        Extremity/Trunk Assessment   Upper Extremity Assessment: Defer to OT evaluation           Lower Extremity Assessment: Generalized weakness (Sensation WFL BLEs.)         Communication   Communication: No difficulties  Cognition Arousal/Alertness: Awake/alert Behavior During Therapy:  WFL for tasks assessed/performed Overall Cognitive Status: Within Functional Limits for tasks assessed       Memory: Decreased recall of precautions              General Comments      Exercises         Assessment/Plan    PT Assessment Patient needs continued PT services  PT Diagnosis Generalized weakness;Acute pain;Difficulty walking   PT Problem List Decreased strength;Pain;Decreased activity tolerance;Decreased balance;Decreased mobility;Decreased knowledge of precautions  PT Treatment Interventions Balance training;Gait training;Stair training;Functional mobility training;Therapeutic activities;Therapeutic exercise;Patient/family education   PT Goals (Current goals can be found in the Care Plan section) Acute Rehab PT Goals Patient Stated Goal: to get better PT Goal Formulation: With patient Time For Goal Achievement: 09/13/14 Potential to Achieve Goals: Good    Frequency Min 5X/week   Barriers to discharge        Co-evaluation PT/OT/SLP Co-Evaluation/Treatment: Yes Reason for Co-Treatment: For patient/therapist safety (to be able to get both therapies today.) PT goals addressed during session: Mobility/safety with mobility;Proper use of DME         End of Session Equipment Utilized During Treatment: Gait belt;Back brace Activity Tolerance: Patient tolerated treatment well Patient left: in chair;with call bell/phone within reach Nurse Communication: Mobility status         Time: 6415-8309 PT Time Calculation (min) (ACUTE ONLY): 31 min   Charges:   PT Evaluation $Initial PT Evaluation Tier I: 1 Procedure     PT G Codes:        Kaitlinn Iversen A Shary Lamos 08/30/2014, 1:53 PM Wray Kearns, [place], DPT [phone]

## 2014-08-31 MED ORDER — ONDANSETRON HCL 4 MG PO TABS
4.0000 mg | ORAL_TABLET | Freq: Three times a day (TID) | ORAL | Status: DC | PRN
  Administered 2014-08-31 – 2014-09-01 (×2): 4 mg via ORAL
  Filled 2014-08-31 (×2): qty 1

## 2014-08-31 NOTE — Progress Notes (Signed)
Occupational Therapy Treatment Patient Details Name: DEAH OTTAWAY MRN: [id number] DOB: [birth year] Today's Date: 08/31/2014    History of present illness Patient is a 49 y/o female s/p L4-S1 TLIF. PMH of HTN, asthma, heart murmur.   OT comments  Pt with increased pain and cramping in R thigh today, resulting in difficulty with mobility. She required min A to stand from EOB. Educated pt on use of reacher for LB ADLs and pt returned demo. Acute OT to continue with POC.    Follow Up Recommendations  No OT follow up    Equipment Recommendations  None recommended by OT    Recommendations for Other Services      Precautions / Restrictions Precautions Precautions: Fall;Back Precaution Comments: Reviewed precautions Required Braces or Orthoses: Spinal Brace Spinal Brace: Lumbar corset;Applied in sitting position Restrictions Weight Bearing Restrictions: No       Mobility Bed Mobility Overal bed mobility: Needs Assistance Bed Mobility: Sidelying to Sit   Sidelying to sit: Min guard;HOB elevated       General bed mobility comments: Pt used elevated HOB to assist and required increased time.   Transfers Overall transfer level: Needs assistance Equipment used: Rolling walker (2 wheeled) Transfers: Sit to/from Stand Sit to Stand: Min assist         General transfer comment: Min A to stand from elevated EOB due to leg cramping.         ADL Overall ADL's : Needs assistance/impaired             Lower Body Bathing: Minimal assistance;Sit to/from stand;With adaptive equipment       Lower Body Dressing: Minimal assistance;Sit to/from stand;With adaptive equipment Lower Body Dressing Details (indicate cue type and reason): pt used reacher to don underpants Toilet Transfer: Minimal Insurance claims handler Details (indicate cue type and reason): min A to stand from elevated EOB due to leg cramping Toileting- Clothing Manipulation and Hygiene: Minimal  assistance;Sit to/from stand Toileting - Clothing Manipulation Details (indicate cue type and reason): pt able to pull underwear up after gaining balance but required min A to stand     Functional mobility during ADLs: Min guard;Rolling walker General ADL Comments: Pt with cramping in back of right thigh limiting mobility. She reports having a "bad day." Educated pt on use of reacher for LB ADLs. Pt practiced with underwear.                 Cognition   Behavior During Therapy: WFL for tasks assessed/performed Overall Cognitive Status: Within Functional Limits for tasks assessed                                    Pertinent Vitals/ Pain       Pain Assessment: 0-10 Pain Score: 7  Pain Location: back of right thigh Pain Descriptors / Indicators: Cramping Pain Intervention(s): Monitored during session;Repositioned         Frequency Min 2X/week     Progress Toward Goals  OT Goals(current goals can now be found in the care plan section)  Progress towards OT goals: Not progressing toward goals - comment (due to pain and cramping today)     Plan Discharge plan remains appropriate       End of Session Equipment Utilized During Treatment: Gait belt;Rolling walker;Back brace   Activity Tolerance Patient limited by pain   Patient Left with call bell/phone within reach;Other (comment) (sitting EOB)  Nurse Communication          Time: 5051-8335 OT Time Calculation (min): 24 min  Charges: OT General Charges $OT Visit: 1 Procedure OT Treatments $Self Care/Home Management : 23-37 mins  Juluis Rainier 08/31/2014, 6:03 PM  Cyndie Chime, OTR/L Occupational Therapist [phone] (pager)

## 2014-08-31 NOTE — Plan of Care (Signed)
Problem: Consults Goal: Diagnosis - Spinal Surgery Thoraco/Lumbar Spine Fusion. L5-S1 TLIF

## 2014-08-31 NOTE — Progress Notes (Signed)
OT Cancellation Note  Patient Details Name: Brandi Chase MRN: [id number] DOB: 09-07-1965   Cancelled Treatment:    Reason Eval/Treat Not Completed: Pain limiting ability to participate . Pt lying in bed groaning. When asked if she was in pain she replied "uh huh." Encouraged pt to get OOB but pt declined and stated she did not feel well. Again encouraged pt but she reports she cannot right now. Acute OT will follow up as available.   Villa Herb M   Cyndie Chime, OTR/L Occupational Therapist [phone] (pager)  08/31/2014, 10:12 AM

## 2014-08-31 NOTE — Progress Notes (Signed)
Physical Therapy Treatment Patient Details Name: Brandi Chase MRN: [id number] DOB: 05/11/1965 Today's Date: 08/31/2014    History of Present Illness Patient is a 49 y/o female s/p L4-S1 TLIF. PMH of HTN, asthma, heart murmur.    PT Comments    Patient not feeling as well today as yesterday, still agreeable to therapy.  Partner present for therapy session, assisted with getting out of bed.  MIN guard overall with hospital bed, required MOD assist during mock up of home bed set-up, with log roll technique.  Stairs with MIN guard assist and cues for technique.  Patient progressing towards goals despite not feeling well.  Will benefit from further stair training, bed mobility, and mobility at this time.  Cleared patient to ambulate with assistance.  Follow Up Recommendations  No PT follow up;Supervision/Assistance - 24 hour     Equipment Recommendations  None recommended by PT (Has walker at home.)    Recommendations for Other Services       Precautions / Restrictions Precautions Precautions: Fall;Back Precaution Comments: Reviewed precautions Required Braces or Orthoses: Spinal Brace Spinal Brace: Lumbar corset;Applied in sitting position Restrictions Weight Bearing Restrictions: No    Mobility  Bed Mobility Overal bed mobility: Needs Assistance Bed Mobility: Rolling;Sidelying to Sit;Sit to Sidelying Rolling: Min guard Sidelying to sit: Min assist;HOB elevated     Sit to sidelying: Mod assist (Bed flat, no rail (like home)) General bed mobility comments: Cues for log roll technique. Return to bed with bed flat/no rail as would be at home, MOD assist  Transfers Overall transfer level: Needs assistance Equipment used: Rolling walker (2 wheeled) Transfers: Sit to/from Stand Sit to Stand: Min guard         General transfer comment: Min guard for safety. Slow pace, careful.  Ambulation/Gait Ambulation/Gait assistance: Supervision Ambulation Distance (Feet): 150  Feet Assistive device: Rolling walker (2 wheeled) Gait Pattern/deviations: Step-through pattern;Decreased stride length;Trunk flexed   Gait velocity interpretation: Below normal speed for age/gender General Gait Details: Cues to relax shoulders and for upright posture.    Stairs Stairs: Yes Stairs assistance: Min guard Stair Management: Two rails;Step to pattern Number of Stairs: 4 General stair comments: Cues for 'strong leg first up/weak down first'  Wheelchair Mobility    Modified Rankin (Stroke Patients Only)       Balance     Sitting balance-Leahy Scale: Good Sitting balance - Comments: ABle to donn/doff LSO sitting EOB without assist.      Standing balance-Leahy Scale: Fair                      Cognition Arousal/Alertness: Awake/alert Behavior During Therapy: WFL for tasks assessed/performed Overall Cognitive Status: Within Functional Limits for tasks assessed       Memory: Decreased recall of precautions              Exercises      General Comments        Pertinent Vitals/Pain Pain Assessment: 0-10 Pain Score: 5  Pain Location: Back/right hip Pain Descriptors / Indicators: Aching;Sore;Tingling Pain Intervention(s): Monitored during session;Premedicated before session;Limited activity within patient's tolerance    Home Living                      Prior Function            PT Goals (current goals can now be found in the care plan section) Acute Rehab PT Goals Patient Stated Goal: to get better PT Goal  Formulation: With patient Time For Goal Achievement: 09/13/14 Potential to Achieve Goals: Good Progress towards PT goals: Progressing toward goals    Frequency  Min 5X/week    PT Plan Current plan remains appropriate    Co-evaluation             End of Session Equipment Utilized During Treatment: Gait belt;Back brace Activity Tolerance: Patient tolerated treatment well Patient left: in bed;with call  bell/phone within reach;with family/visitor present     Time: 0076-2263 PT Time Calculation (min) (ACUTE ONLY): 40 min  Charges:  $Gait Training: 23-37 mins $Therapeutic Activity: 8-22 mins                    G Codes:      Xzaiver Vayda L 09-01-2014, 11:33 AM

## 2014-08-31 NOTE — Progress Notes (Signed)
Subjective: 2 Days Post-Op Procedure(s) (LRB): TRANSFORAMINAL LUMBAR INTERBODY FUSION (TLIF) L4-S1 (N/A) Patient reports pain as 5 on 0-10 scale.Doing Fine. Up in chair. Neuro intact.Had a BM and voiding.    Objective: Vital signs in last 24 hours: Temp:  [98.1 F (36.7 C)-98.5 F (36.9 C)] 98.4 F (36.9 C) (05/21 0610) Pulse Rate:  [71-79] 75 (05/21 0610) Resp:  [16-17] 17 (05/21 0610) BP: (129-157)/(69-79) 135/71 mmHg (05/21 0610) SpO2:  [100 %] 100 % (05/21 0610)  Intake/Output from previous day: 05/20 0701 - 05/21 0700 In: 360 [P.O.:360] Out: -  Intake/Output this shift:     Recent Labs  08/28/14 1623  HGB 11.4*    Recent Labs  08/28/14 1623  WBC 12.3*  RBC 4.14  HCT 35.2*  PLT 279    Recent Labs  08/28/14 1623  NA 139  K 3.8  CL 109  CO2 22  BUN 13  CREATININE 0.74  GLUCOSE 94  CALCIUM 8.8*   No results for input(s): LABPT, INR in the last 72 hours.  Neurologically intact  Assessment/Plan: 2 Days Post-Op Procedure(s) (LRB): TRANSFORAMINAL LUMBAR INTERBODY FUSION (TLIF) L4-S1 (N/A) Up with therapy DC Sunday since she still has significant Back Pain.  Rayan Dyal A 08/31/2014, 8:08 AM

## 2014-09-01 NOTE — Progress Notes (Addendum)
Subjective: 3 Days Post-Op Procedure(s) (LRB): TRANSFORAMINAL LUMBAR INTERBODY FUSION (TLIF) L4-S1 (N/A) Patient reports pain as moderate.  Pt c/o pain radiating down the R LE behind the right thigh, knee and calf.  She denies any calf soreness.  She says the pain is worse with standing and walking.  No CP or SOB.  + BM.  Tolerating regular diet.  Objective: Vital signs in last 24 hours: Temp:  [98.3 F (36.8 C)-99.3 F (37.4 C)] 98.3 F (36.8 C) (05/22 0524) Pulse Rate:  [74-92] 79 (05/22 0524) Resp:  [18] 18 (05/22 0524) BP: (114-131)/(58-65) 114/58 mmHg (05/22 0524) SpO2:  [98 %-100 %] 98 % (05/22 0524)  Intake/Output from previous day:   Intake/Output this shift: Total I/O In: 120 [P.O.:120] Out: -   No results for input(s): HGB in the last 72 hours. No results for input(s): WBC, RBC, HCT, PLT in the last 72 hours. No results for input(s): NA, K, CL, CO2, BUN, CREATININE, GLUCOSE, CALCIUM in the last 72 hours. No results for input(s): LABPT, INR in the last 72 hours.  PE:  wn wd woman in nad.  A and O x 4.  Wound dressed and dry.  R knee without swelling or effusion.  ROM 0-105.  NTTP at knee.  NTTP at calf.  Neg Hohman's sign.  5/5 strength in PF adn DF of ankle and toes.  Sens to LT intact at L4, 5 and S1 dist.  Assessment/Plan: 3 Days Post-Op Procedure(s) (LRB): TRANSFORAMINAL LUMBAR INTERBODY FUSION (TLIF) L4-S1 (N/A) Up with therapy  I don't believe this knee pain represents a DVT.  It seems more radicular in nature.  She can continue WBAT.  She requires another day in the hospital due to pain and need for more therapy.  Wylene Simmer 09/01/2014, 9:31 AM

## 2014-09-01 NOTE — Progress Notes (Signed)
Physical Therapy Treatment Patient Details Name: Brandi Chase MRN: [id number] DOB: Jul 08, 1965 Today's Date: 09-25-2014    History of Present Illness Patient is a 48 y/o female s/p L4-S1 TLIF. PMH of HTN, asthma, heart murmur.    PT Comments    Patient continues to progress, still mostly limited by pain in right leg.    Follow Up Recommendations  No PT follow up;Supervision - Intermittent     Equipment Recommendations  None recommended by PT    Recommendations for Other Services       Precautions / Restrictions Precautions Precautions: Fall;Back Precaution Comments: Reviewed precautions Required Braces or Orthoses: Spinal Brace Spinal Brace: Lumbar corset;Applied in sitting position    Mobility  Bed Mobility               General bed mobility comments: in chair upon arrival  Transfers Overall transfer level: Modified independent Equipment used: Rolling walker (2 wheeled) Transfers: Sit to/from Stand Sit to Stand: Modified independent (Device/Increase time)         General transfer comment: requires increased time due to pain in right leg.  Ambulation/Gait Ambulation/Gait assistance: Modified independent (Device/Increase time) Ambulation Distance (Feet): 150 Feet Assistive device: Rolling walker (2 wheeled) Gait Pattern/deviations: Step-through pattern;Decreased stride length Gait velocity: decreased       Stairs            Wheelchair Mobility    Modified Rankin (Stroke Patients Only)       Balance Overall balance assessment: No apparent balance deficits (not formally assessed)                                  Cognition Arousal/Alertness: Awake/alert Behavior During Therapy: WFL for tasks assessed/performed Overall Cognitive Status: Within Functional Limits for tasks assessed                      Exercises General Exercises - Lower Extremity Ankle Circles/Pumps: AROM;Both;5 reps Short Arc Quad:  AROM;Right;5 reps    General Comments        Pertinent Vitals/Pain Pain Assessment: 0-10 Pain Score: 4  Pain Location: back of right thigh Pain Descriptors / Indicators: Burning Pain Intervention(s): Monitored during session;Limited activity within patient's tolerance    Home Living                      Prior Function            PT Goals (current goals can now be found in the care plan section) Progress towards PT goals: Progressing toward goals    Frequency  Min 5X/week    PT Plan Current plan remains appropriate    Co-evaluation             End of Session Equipment Utilized During Treatment: Gait belt;Back brace Activity Tolerance: Patient tolerated treatment well Patient left: in chair;with call bell/phone within reach     Time: 2836-6294 PT Time Calculation (min) (ACUTE ONLY): 18 min  Charges:  $Gait Training: 8-22 mins                    G Codes:      Brandi Chase 09/25/2014, 11:44 AM  09-25-14 [place], [place]

## 2014-09-02 ENCOUNTER — Encounter (HOSPITAL_COMMUNITY): Payer: Self-pay | Admitting: Orthopedic Surgery

## 2014-09-02 MED ORDER — BISACODYL 10 MG RE SUPP
10.0000 mg | Freq: Once | RECTAL | Status: AC
  Administered 2014-09-03: 10 mg via RECTAL
  Filled 2014-09-02: qty 1

## 2014-09-02 MED ORDER — POLYETHYLENE GLYCOL 3350 17 G PO PACK
17.0000 g | PACK | Freq: Every day | ORAL | Status: DC
  Administered 2014-09-02: 17 g via ORAL
  Filled 2014-09-02: qty 1

## 2014-09-02 NOTE — Progress Notes (Signed)
Physical Therapy Treatment Patient Details Name: Brandi Chase MRN: [id number] DOB: 01/12/1966 Today's Date: 09/02/2014    History of Present Illness Patient is a 49 y/o female s/p L4-S1 TLIF. PMH of HTN, asthma, heart murmur.    PT Comments    Pt w/ very decreased gait speed which she was able to improve slightly this session.  Pt able to complete log roll into and OOB w/ min guard and verbal cues for technique; however would benefit from additional practice w/ getting into bed as she has difficulty w/ bringing BLEs into bed during log roll technique.  Pt demonstrated ability to ascend/descend 4 steps w/ B rails this session.   Follow Up Recommendations  No PT follow up;Supervision - Intermittent     Equipment Recommendations  None recommended by PT    Recommendations for Other Services       Precautions / Restrictions Precautions Precautions: Fall;Back Precaution Comments: Reviewed precautions; pt remembered all Required Braces or Orthoses: Spinal Brace Spinal Brace: Lumbar corset (pt wearing brace upon PT arrival) Restrictions Weight Bearing Restrictions: No    Mobility  Bed Mobility Overal bed mobility: Needs Assistance Bed Mobility: Rolling;Sidelying to Sit;Sit to Sidelying Rolling: Min guard Sidelying to sit: Min guard     Sit to sidelying: Min guard General bed mobility comments: Min verbal cues for technique.  Pt w/ most difficulty bringing BLEs up into bed while performing log roll.  Pt requires increased time.  Transfers Overall transfer level: Modified independent Equipment used: Rolling walker (2 wheeled) Transfers: Sit to/from Stand Sit to Stand: Modified independent (Device/Increase time)         General transfer comment: Increased time 2/2 pain and anxiety about damaging her back  Ambulation/Gait Ambulation/Gait assistance: Supervision Ambulation Distance (Feet): 200 Feet Assistive device: Rolling walker (2 wheeled) Gait  Pattern/deviations: Step-through pattern;Antalgic Gait velocity: very decreased Gait velocity interpretation: Below normal speed for age/gender General Gait Details: Pt w/ improved posture this session.  Cues to increase gait speed slightly and pt was able to do so safely.     Stairs Stairs: Yes Stairs assistance: Min guard Stair Management: Two rails;Forwards;Step to pattern Number of Stairs: 4 General stair comments: Cues for 'strong leg first up/weak down first'  Wheelchair Mobility    Modified Rankin (Stroke Patients Only)       Balance Overall balance assessment: Needs assistance Sitting-balance support: No upper extremity supported;Feet supported Sitting balance-Leahy Scale: Good     Standing balance support: Bilateral upper extremity supported;During functional activity Standing balance-Leahy Scale: Fair                      Cognition Arousal/Alertness: Awake/alert Behavior During Therapy: WFL for tasks assessed/performed Overall Cognitive Status: Within Functional Limits for tasks assessed                      Exercises      General Comments        Pertinent Vitals/Pain Pain Assessment: 0-10 Pain Score: 3  Pain Location: back, RLE Pain Descriptors / Indicators: Aching Pain Intervention(s): Limited activity within patient's tolerance;Monitored during session;Repositioned    Home Living                      Prior Function            PT Goals (current goals can now be found in the care plan section) Acute Rehab PT Goals Patient Stated Goal: to get better Progress  towards PT goals: Progressing toward goals    Frequency  Min 5X/week    PT Plan Current plan remains appropriate    Co-evaluation             End of Session Equipment Utilized During Treatment: Back brace Activity Tolerance: Patient tolerated treatment well Patient left: in bed;with call bell/phone within reach     Time: 1342-1404 PT Time  Calculation (min) (ACUTE ONLY): 22 min  Charges:  $Gait Training: 8-22 mins                    G Codes:      Joslyn Hy PT, [place] [phone] Pager: [phone] 09/02/2014, 3:48 PM

## 2014-09-02 NOTE — Progress Notes (Signed)
    Subjective: Procedure(s) (LRB): TRANSFORAMINAL LUMBAR INTERBODY FUSION (TLIF) L4-S1 (N/A) 4 Days Post-Op  Patient reports pain as 3 on 0-10 scale.  Reports decreased leg pain reports incisional back pain   Positive void Positive bowel movement Positive flatus Negative chest pain or shortness of breath  Objective: Vital signs in last 24 hours: Temp:  [98.2 F (36.8 C)-99.9 F (37.7 C)] 98.2 F (36.8 C) (05/23 1335) Pulse Rate:  [86-92] 92 (05/23 1335) Resp:  [18-20] 20 (05/23 1335) BP: (117-130)/(62-68) 118/68 mmHg (05/23 1335) SpO2:  [96 %-100 %] 100 % (05/23 1335)  Intake/Output from previous day: 05/22 0701 - 05/23 0700 In: 600 [P.O.:600] Out: 200 [Urine:200]  Labs: No results for input(s): WBC, RBC, HCT, PLT in the last 72 hours. No results for input(s): NA, K, CL, CO2, BUN, CREATININE, GLUCOSE, CALCIUM in the last 72 hours. No results for input(s): LABPT, INR in the last 72 hours.  Physical Exam: Neurologically intact ABD soft Intact pulses distally Incision: dressing C/D/I and no drainage Compartment soft  Assessment/Plan: Patient stable  xrays satisfactory Continue mobilization with physical therapy Continue care  Up with therapy Plan for discharge tomorrow Discharge home with home health  If needed   Melina Schools, MD [place] [phone]

## 2014-09-03 MED ORDER — ONDANSETRON HCL 4 MG PO TABS: 4.0000 mg | ORAL_TABLET | Freq: Three times a day (TID) | ORAL | Status: DC | PRN

## 2014-09-03 MED ORDER — ACETAMINOPHEN 325 MG PO TABS: 650.0000 mg | ORAL_TABLET | Freq: Four times a day (QID) | ORAL | Status: DC | PRN

## 2014-09-03 MED ORDER — POLYETHYLENE GLYCOL 3350 17 G PO PACK: 17.0000 g | PACK | Freq: Every day | ORAL | Status: DC

## 2014-09-03 NOTE — Progress Notes (Signed)
Brandi Chase to be D/C'd Home per MD order. Discussed with the patient and all questions fully answered.    Medication List    STOP taking these medications        fluticasone 50 MCG/ACT nasal spray  Commonly known as:  FLONASE     oxyCODONE-acetaminophen 5-325 MG per tablet  Commonly known as:  PERCOCET     predniSONE 20 MG tablet  Commonly known as:  DELTASONE     traMADol 50 MG tablet  Commonly known as:  ULTRAM      TAKE these medications        albuterol 108 (90 BASE) MCG/ACT inhaler  Commonly known as:  PROVENTIL HFA;VENTOLIN HFA  Inhale 2 puffs into the lungs every 6 (six) hours as needed for wheezing or shortness of breath.     butalbital-acetaminophen-caffeine 50-325-40 MG per tablet  Commonly known as:  FIORICET  Take 1 tablet by mouth every 6 (six) hours as needed for headache.     CALTRATE 600 PO  Take 600 mg by mouth every evening.     ferrous sulfate 325 (65 FE) MG tablet  Take 325 mg by mouth every morning.     ibuprofen 600 MG tablet  Commonly known as:  ADVIL,MOTRIN  Take 600 mg by mouth daily as needed for headache or moderate pain.     magnesium oxide 400 MG tablet  Commonly known as:  MAG-OX  Take 400 mg by mouth every morning.     methocarbamol 500 MG tablet  Commonly known as:  ROBAXIN  Take 1 tablet (500 mg total) by mouth every 6 (six) hours as needed for muscle spasms.     metoprolol tartrate 25 MG tablet  Commonly known as:  LOPRESSOR  Take 12.5 mg by mouth 2 (two) times daily.     ondansetron 4 MG tablet  Commonly known as:  ZOFRAN  Take 1 tablet (4 mg total) by mouth every 8 (eight) hours as needed for nausea or vomiting.     oxyCODONE 5 MG immediate release tablet  Commonly known as:  Oxy IR/ROXICODONE  Take 1-3 tablets (5-15 mg total) by mouth every 4 (four) hours as needed for severe pain.     polyethylene glycol packet  Commonly known as:  MIRALAX / GLYCOLAX  Take 17 g by mouth daily.     POTASSIUM PO  Take 1  tablet by mouth every morning. Over the counter product (does not know strength     Vitamin D 2000 UNITS tablet  Take 2,000 Units by mouth every morning.        VVS, Skin clean, dry and intact without evidence of skin break down, no evidence of skin tears noted.  IV catheter discontinued intact. Site without signs and symptoms of complications. Dressing and pressure applied.  An After Visit Summary was printed and given to the patient.  Patient escorted via [place], and D/C home via private auto.  [name]  09/03/2014 1:06 PM

## 2014-09-03 NOTE — Progress Notes (Signed)
    Subjective: Procedure(s) (LRB): TRANSFORAMINAL LUMBAR INTERBODY FUSION (TLIF) L4-S1 (N/A) 5 Days Post-Op  Patient reports pain as 2 on 0-10 scale.  Reports decreased leg pain reports incisional back pain   Positive void Positive bowel movement Positive flatus Negative chest pain or shortness of breath  Objective: Vital signs in last 24 hours: Temp:  [97.3 F (36.3 C)-98.5 F (36.9 C)] 98.5 F (36.9 C) (05/24 0448) Pulse Rate:  [78-92] 78 (05/24 0448) Resp:  [18-20] 18 (05/24 0448) BP: (112-130)/(59-68) 112/59 mmHg (05/24 0448) SpO2:  [97 %-100 %] 97 % (05/24 0448)  Intake/Output from previous day: 05/23 0701 - 05/24 0700 In: 600 [P.O.:600] Out: -   Labs: No results for input(s): WBC, RBC, HCT, PLT in the last 72 hours. No results for input(s): NA, K, CL, CO2, BUN, CREATININE, GLUCOSE, CALCIUM in the last 72 hours. No results for input(s): LABPT, INR in the last 72 hours.  Physical Exam: Neurologically intact ABD soft Intact pulses distally Incision: dressing C/D/I Compartment soft  Assessment/Plan: Patient stable  xrays n/a Continue mobilization with physical therapy Continue care  Up with therapy  Plan on d/c today  Melina Schools, MD [place] [phone]

## 2014-09-03 NOTE — Discharge Summary (Signed)
Patient ID: Brandi Chase MRN: [id number] DOB/AGE: 07-07-1965 49 y.o.  Admit date: 08/29/2014 Discharge date: 09/03/2014  Admission Diagnoses:  Active Problems:   Back pain   Discharge Diagnoses:  Active Problems:   Back pain  status post Procedure(s): TRANSFORAMINAL LUMBAR INTERBODY FUSION (TLIF) L4-S1  Past Medical History  Diagnosis Date  . Asthma   . Adnexal cyst 2011    removed  . Hip pain, right   . Complication of anesthesia     difficult to awake  . Hypertension   . Family history of adverse reaction to anesthesia     "1 sister and both parents hard to wake up"  . Heart murmur     "dx'd when I was a teenager; haven't had any problems w/it"  . Anemia   . Daily headache 12/2013    "tx'd and no problems anymore" (08/29/2014)  . Arthritis     "neck; spine" (08/29/2014)  . Chronic lower back pain     Surgeries: Procedure(s): TRANSFORAMINAL LUMBAR INTERBODY FUSION (TLIF) L4-S1 on 08/29/2014   Consultants:    Discharged Condition: Improved  Hospital Course: Brandi Chase is an 49 y.o. female who was admitted 08/29/2014 for operative treatment of <principal problem not specified>. Patient failed conservative treatments (please see the history and physical for the specifics) and had severe unremitting pain that affects sleep, daily activities and work/hobbies. After pre-op clearance, the patient was taken to the operating room on 08/29/2014 and underwent  Procedure(s): TRANSFORAMINAL LUMBAR INTERBODY FUSION (TLIF) L4-S1.    Patient was given perioperative antibiotics: Anti-infectives    Start     Dose/Rate Route Frequency Ordered Stop   08/29/14 1900  vancomycin (VANCOCIN) IVPB 1000 mg/200 mL premix     1,000 mg 200 mL/hr over 60 Minutes Intravenous Every 12 hours 08/29/14 1554 08/30/14 0744   08/29/14 0630  vancomycin (VANCOCIN) IVPB 1000 mg/200 mL premix     1,000 mg 200 mL/hr over 60 Minutes Intravenous To Surgery 08/28/14 1053 08/29/14 0823       Patient was given sequential compression devices and early ambulation to prevent DVT.   Patient benefited maximally from hospital stay and there were no complications. At the time of discharge, the patient was urinating/moving their bowels without difficulty, tolerating a regular diet, pain is controlled with oral pain medications and they have been cleared by PT/OT.   Recent vital signs: Patient Vitals for the past 24 hrs:  BP Temp Temp src Pulse Resp SpO2  09/03/14 0448 (!) 112/59 mmHg 98.5 F (36.9 C) Oral 78 18 97 %  09/02/14 1952 130/63 mmHg 97.3 F (36.3 C) Oral 87 18 100 %  09/02/14 1335 118/68 mmHg 98.2 F (36.8 C) Oral 92 20 100 %     Recent laboratory studies: No results for input(s): WBC, HGB, HCT, PLT, NA, K, CL, CO2, BUN, CREATININE, GLUCOSE, INR, CALCIUM in the last 72 hours.  Invalid input(s): PT, 2   Discharge Medications:     Medication List    STOP taking these medications        fluticasone 50 MCG/ACT nasal spray  Commonly known as:  FLONASE     ondansetron 8 MG tablet  Commonly known as:  ZOFRAN     oxyCODONE-acetaminophen 5-325 MG per tablet  Commonly known as:  PERCOCET     predniSONE 20 MG tablet  Commonly known as:  DELTASONE     traMADol 50 MG tablet  Commonly known as:  Veatrice Bourbon  TAKE these medications        albuterol 108 (90 BASE) MCG/ACT inhaler  Commonly known as:  PROVENTIL HFA;VENTOLIN HFA  Inhale 2 puffs into the lungs every 6 (six) hours as needed for wheezing or shortness of breath.     butalbital-acetaminophen-caffeine 50-325-40 MG per tablet  Commonly known as:  FIORICET  Take 1 tablet by mouth every 6 (six) hours as needed for headache.     CALTRATE 600 PO  Take 600 mg by mouth every evening.     ferrous sulfate 325 (65 FE) MG tablet  Take 325 mg by mouth every morning.     ibuprofen 600 MG tablet  Commonly known as:  ADVIL,MOTRIN  Take 600 mg by mouth daily as needed for headache or moderate pain.     magnesium  oxide 400 MG tablet  Commonly known as:  MAG-OX  Take 400 mg by mouth every morning.     methocarbamol 500 MG tablet  Commonly known as:  ROBAXIN  Take 1 tablet (500 mg total) by mouth every 6 (six) hours as needed for muscle spasms.     metoprolol tartrate 25 MG tablet  Commonly known as:  LOPRESSOR  Take 12.5 mg by mouth 2 (two) times daily.     oxyCODONE 5 MG immediate release tablet  Commonly known as:  Oxy IR/ROXICODONE  Take 1-3 tablets (5-15 mg total) by mouth every 4 (four) hours as needed for severe pain.     POTASSIUM PO  Take 1 tablet by mouth every morning. Over the counter product (does not know strength     Vitamin D 2000 UNITS tablet  Take 2,000 Units by mouth every morning.        Diagnostic Studies: Dg Lumbar Spine 2-3 Views  08/30/2014   CLINICAL DATA:  49 year old female status post lumbar spine surgery. Initial encounter.  EXAM: LUMBAR SPINE - 2-3 VIEW  COMPARISON:  Intraoperative images [id number] 08/29/2014. CT Abdomen and Pelvis 05/11/2009.  FINDINGS: Sequelae of transpedicular and interbody fusion hardware placement at L4-L5 and L5-S1. Normal lumbar segmentation. Unilateral left pedicle screw at L5, otherwise bilateral screws. Hardware appears intact. Vertebral height and alignment is stable.  Severe right hip joint degeneration, progressed since 2011.  IMPRESSION: 1. L4-L5 and L5-S1 posterior and interbody fusion hardware in place, no adverse features. 2. Severe right hip joint degeneration, significantly progressed since 2011.   Electronically Signed   By: Genevie Ann M.D.   On: 08/30/2014 11:25   Dg Lumbar Spine Complete  08/29/2014   CLINICAL DATA:  L4-S1 TLIF  EXAM: DG C-ARM 61-120 MIN; LUMBAR SPINE - COMPLETE 4+ VIEW  TECHNIQUE: Four digital C-arm fluoroscopic images obtained intraoperatively are submitted  CONTRAST:  None utilized  FLUOROSCOPY TIME:  Radiation Exposure Index (as provided by the fluoroscopic device): Not provided  If the device does not provide the  exposure index:  Fluoroscopy Time (in minutes and seconds):  4 minutes 32 seconds  Number of Acquired Images:  4  COMPARISON:  Abdominal radiographs 05/11/2009  FINDINGS: Five lumbar vertebrae on prior abdominal radiographs.  Images demonstrate presence of BILATERAL pedicle screws and bars at L4-S1 post posterior fusion.  Disc prostheses present at the L4-L5 and L5-S1 disc spaces.  Minimal anterolisthesis at L4-L5.  No fracture or bone destruction.  IMPRESSION: Posterior fusion L4-S1.   Electronically Signed   By: Lavonia Dana M.D.   On: 08/29/2014 14:07   Dg C-arm 61-120 Min  08/29/2014   CLINICAL DATA:  L4-S1 TLIF  EXAM: DG C-ARM 61-120 MIN; LUMBAR SPINE - COMPLETE 4+ VIEW  TECHNIQUE: Four digital C-arm fluoroscopic images obtained intraoperatively are submitted  CONTRAST:  None utilized  FLUOROSCOPY TIME:  Radiation Exposure Index (as provided by the fluoroscopic device): Not provided  If the device does not provide the exposure index:  Fluoroscopy Time (in minutes and seconds):  4 minutes 32 seconds  Number of Acquired Images:  4  COMPARISON:  Abdominal radiographs 05/11/2009  FINDINGS: Five lumbar vertebrae on prior abdominal radiographs.  Images demonstrate presence of BILATERAL pedicle screws and bars at L4-S1 post posterior fusion.  Disc prostheses present at the L4-L5 and L5-S1 disc spaces.  Minimal anterolisthesis at L4-L5.  No fracture or bone destruction.  IMPRESSION: Posterior fusion L4-S1.   Electronically Signed   By: Lavonia Dana M.D.   On: 08/29/2014 14:07          Follow-up Information    Follow up with Dahlia Bailiff, MD. Schedule an appointment as soon as possible for a visit in 2 weeks.   Specialty:  Orthopedic Surgery   Contact information:   [address] [place] [address] [phone]       Discharge Plan:  discharge to home  Disposition: hospital course uneventful.  Remained neuro intact.  Pain was an issue but is now controlled.  Ambulating with  walker. F/u in 2 weeks for re-evaluation    Signed: Melina Schools D for Dr. Melina Schools [facility] [phone] 09/03/2014, 8:02 AM

## 2014-09-03 NOTE — Progress Notes (Signed)
Occupational Therapy Treatment Patient Details Name: Brandi Chase MRN: [id number] DOB: [birth year] Today's Date: 09/03/2014    History of present illness Patient is a 49 y/o female s/p L4-S1 TLIF. PMH of HTN, asthma, heart murmur.   OT comments  Pt making progress towards OT goals, continue plan of care for now. Pt overall mod I > supervision with ADLs and functional mobility/transfers. Due to pain and weakness, pt unable to cross RLE for LB ADLs; therefore encouraged and recommended use of AE to increase independence with this. Practiced tub/shower transfer, pt stepping over tub threshold while using RW prn and holding onto grab bar; recommend supervision when doing this at home and pt aware/agreed.    Follow Up Recommendations  No OT follow up;Supervision - Intermittent    Equipment Recommendations  Other (comment) (AE - reacher, sock aid, LH sponge, LH shoe horn)    Recommendations for Other Services  None at this time  Precautions / Restrictions Precautions Precautions: Fall;Back Precaution Comments: Reviewed precautions; pt remembered all Required Braces or Orthoses: Spinal Brace Spinal Brace: Lumbar corset Restrictions Weight Bearing Restrictions: No    Mobility Bed Mobility General bed mobility comments: Pt found seated in recliner upon OT entering/exiting the room  Transfers Overall transfer level: Needs assistance Equipment used: Rolling walker (2 wheeled) Transfers: Sit to/from Stand Sit to Stand: Modified independent (Device/Increase time);Supervision General transfer comment: Pt mod I > supervision with transfers. Mod I with basic transfers, supervision for tub/shower transfer. Pt does take extra time to complete transfers at this time.     Balance Overall balance assessment: Needs assistance Sitting-balance support: No upper extremity supported;Feet supported Sitting balance-Leahy Scale: Good     Standing balance support: Bilateral upper extremity  supported;During functional activity Standing balance-Leahy Scale: Good   ADL Overall ADL's : Needs assistance/impaired General ADL Comments: Pt able to verbalize and adhere to 3/3 back precautions during this OT session. Introduced, demonstrated, and had pt return demonstrate use of AE for LB ADLs. Encouraged pt to purchase hip kit to help increase independence with LB ADLs. Pt also ambulated > therapy gym for tub/shower transfer (technique for stepping over tub threshold), close supervision and mod cueing for sequencing needed for this. Pt takes extra time to complete tasks, but does so safely.      Cognition   Behavior During Therapy: WFL for tasks assessed/performed Overall Cognitive Status: Within Functional Limits for tasks assessed                Pertinent Vitals/ Pain       Pain Assessment: 0-10 Pain Score: 5  Pain Location: headache and back>RLE Pain Descriptors / Indicators: Aching;Headache;Cramping Pain Intervention(s): Limited activity within patient's tolerance;Monitored during session         Frequency Min 2X/week     Progress Toward Goals  OT Goals(current goals can now be found in the care plan section)  Progress towards OT goals: Progressing toward goals     Plan Discharge plan remains appropriate       End of Session Equipment Utilized During Treatment: Rolling walker;Back brace   Activity Tolerance Patient tolerated treatment well   Patient Left in chair;with call bell/phone within reach     Time: 1155-2080 OT Time Calculation (min): 27 min  Charges: OT General Charges $OT Visit: 1 Procedure OT Treatments $Self Care/Home Management : 23-37 mins  Christinia Lambeth , MS, OTR/L, CLT Pager: [phone]  09/03/2014, 9:33 AM

## 2014-11-04 ENCOUNTER — Emergency Department (HOSPITAL_COMMUNITY)
Admission: EM | Admit: 2014-11-04 | Discharge: 2014-11-04 | Disposition: A | Discharge status: Home / Self Care | Payer: No Typology Code available for payment source | Attending: Emergency Medicine | Admitting: Emergency Medicine

## 2014-11-04 ENCOUNTER — Encounter (HOSPITAL_COMMUNITY): Payer: Self-pay | Admitting: Emergency Medicine

## 2014-11-04 DIAGNOSIS — M47819 Spondylosis without myelopathy or radiculopathy, site unspecified: Secondary | ICD-10-CM | POA: Diagnosis not present

## 2014-11-04 DIAGNOSIS — M25561 Pain in right knee: Secondary | ICD-10-CM | POA: Diagnosis not present

## 2014-11-04 DIAGNOSIS — Z79899 Other long term (current) drug therapy: Secondary | ICD-10-CM | POA: Diagnosis not present

## 2014-11-04 DIAGNOSIS — G8929 Other chronic pain: Secondary | ICD-10-CM | POA: Insufficient documentation

## 2014-11-04 DIAGNOSIS — I10 Essential (primary) hypertension: Secondary | ICD-10-CM | POA: Diagnosis not present

## 2014-11-04 DIAGNOSIS — D649 Anemia, unspecified: Secondary | ICD-10-CM | POA: Diagnosis not present

## 2014-11-04 DIAGNOSIS — J45909 Unspecified asthma, uncomplicated: Secondary | ICD-10-CM | POA: Insufficient documentation

## 2014-11-04 DIAGNOSIS — M47812 Spondylosis without myelopathy or radiculopathy, cervical region: Secondary | ICD-10-CM | POA: Diagnosis not present

## 2014-11-04 DIAGNOSIS — Z8742 Personal history of other diseases of the female genital tract: Secondary | ICD-10-CM | POA: Insufficient documentation

## 2014-11-04 DIAGNOSIS — R011 Cardiac murmur, unspecified: Secondary | ICD-10-CM | POA: Insufficient documentation

## 2014-11-04 DIAGNOSIS — Z91040 Latex allergy status: Secondary | ICD-10-CM | POA: Insufficient documentation

## 2014-11-04 DIAGNOSIS — R531 Weakness: Secondary | ICD-10-CM | POA: Insufficient documentation

## 2014-11-04 MED ORDER — KETOROLAC TROMETHAMINE 30 MG/ML IJ SOLN
30.0000 mg | Freq: Once | INTRAMUSCULAR | Status: AC
  Administered 2014-11-04: 30 mg via INTRAMUSCULAR
  Filled 2014-11-04: qty 1

## 2014-11-04 NOTE — ED Provider Notes (Signed)
CSN: [id number]     Arrival date & time 11/04/14  1637 History   This chart was scribed for Brandi Glazier, PA-C working with Carmin Muskrat, MD by Randa Evens, ED Scribe. This patient was seen in room TR07C/TR07C and the patient's care was started at 5:04 PM.    Chief Complaint  Patient presents with  . Leg Pain    The paient said her right has been hurting since June when she had back surgery.  The patient said she received a cortisone shot two weeks ago and she is still hurting.      Patient is a 49 y.o. female presenting with leg pain. The history is provided by the patient. No language interpreter was used.  Leg Pain Associated symptoms: no fever    HPI Comments: CORTINA VULTAGGIO is a 49 y.o. female who presents to the Emergency Department complaining of right leg and right knee pain onset in may 2016 since having back surgery. She states she has some slight weakness in the right leg as well. She states that the pain radiates up form her knee into her right thigh. She states that the pain is worse when ambulating. Pt states she received cortisone injection 2 weeks prior with no relief. Pt states she has been taking oxycodone 5mg  with no relief. Pt denies numbness or weakness.   Past Medical History  Diagnosis Date  . Asthma   . Adnexal cyst 2011    removed  . Hip pain, right   . Complication of anesthesia     difficult to awake  . Hypertension   . Family history of adverse reaction to anesthesia     "1 sister and both parents hard to wake up"  . Heart murmur     "dx'd when I was a teenager; haven't had any problems w/it"  . Anemia   . Daily headache 12/2013    "tx'd and no problems anymore" (08/29/2014)  . Arthritis     "neck; spine" (08/29/2014)  . Chronic lower back pain    Past Surgical History  Procedure Laterality Date  . Transforaminal lumbar interbody fusion (tlif) with pedicle screw fixation 2 level  08/29/2014    L4-S1  . Back surgery    . Reduction  mammaplasty Bilateral 1995  . Ovarian cyst removal Right 2011  . Tubal ligation  1992  . Transforaminal lumbar interbody fusion (tlif) with pedicle screw fixation 2 level N/A 08/29/2014    Procedure: TRANSFORAMINAL LUMBAR INTERBODY FUSION (TLIF) L4-S1;  Surgeon: Melina Schools, MD;  Location: [facility];  Service: Orthopedics;  Laterality: N/A;   History reviewed. No pertinent family history. History  Substance Use Topics  . Smoking status: Never Smoker   . Smokeless tobacco: Never Used  . Alcohol Use: No   OB History    No data available      Review of Systems  Constitutional: Negative for fever and chills.  Musculoskeletal: Positive for arthralgias. Negative for gait problem.  Skin: Negative for wound.  Neurological: Positive for weakness. Negative for numbness.     Allergies  Other; Primatene mist; Aleve; Midol; Avocado; Bactrim ds; Cocoa butter; and Latex  Home Medications   Prior to Admission medications   Medication Sig Start Date End Date Taking? Authorizing Provider  albuterol (PROVENTIL HFA;VENTOLIN HFA) 108 (90 BASE) MCG/ACT inhaler Inhale 2 puffs into the lungs every 6 (six) hours as needed for wheezing or shortness of breath.    Historical Provider, MD  butalbital-acetaminophen-caffeine (FIORICET) 50-325-40 MG per  tablet Take 1 tablet by mouth every 6 (six) hours as needed for headache. 11/30/13 11/30/14  Elmyra Ricks Pisciotta, PA-C  Calcium Carbonate (CALTRATE 600 PO) Take 600 mg by mouth every evening.     Historical Provider, MD  Cholecalciferol (VITAMIN D) 2000 UNITS tablet Take 2,000 Units by mouth every morning.     Historical Provider, MD  ferrous sulfate 325 (65 FE) MG tablet Take 325 mg by mouth every morning.     Historical Provider, MD  ibuprofen (ADVIL,MOTRIN) 600 MG tablet Take 600 mg by mouth daily as needed for headache or moderate pain.     Historical Provider, MD  magnesium oxide (MAG-OX) 400 MG tablet Take 400 mg by mouth every morning.     Historical Provider,  MD  methocarbamol (ROBAXIN) 500 MG tablet Take 1 tablet (500 mg total) by mouth every 6 (six) hours as needed for muscle spasms. 08/30/14   Amber Constable, PA-C  metoprolol tartrate (LOPRESSOR) 25 MG tablet Take 12.5 mg by mouth 2 (two) times daily.    Historical Provider, MD  ondansetron (ZOFRAN) 4 MG tablet Take 1 tablet (4 mg total) by mouth every 8 (eight) hours as needed for nausea or vomiting. 09/03/14   Melina Schools, MD  oxyCODONE (OXY IR/ROXICODONE) 5 MG immediate release tablet Take 1-3 tablets (5-15 mg total) by mouth every 4 (four) hours as needed for severe pain. 08/30/14   Amber Constable, PA-C  polyethylene glycol (MIRALAX / GLYCOLAX) packet Take 17 g by mouth daily. 09/03/14   Melina Schools, MD  POTASSIUM PO Take 1 tablet by mouth every morning. Over the counter product (does not know strength    Historical Provider, MD   BP 118/79 mmHg  Pulse 96  Temp(Src) 98.3 F (36.8 C) (Oral)  Resp 16  SpO2 100%  LMP 11/04/2014   Physical Exam  Constitutional: She is oriented to person, place, and time. She appears well-developed and well-nourished. No distress.  HENT:  Head: Normocephalic and atraumatic.  Eyes: Conjunctivae and EOM are normal.  Neck: Neck supple. No tracheal deviation present.  Cardiovascular: Normal rate.   Pulmonary/Chest: Effort normal. No respiratory distress.  Musculoskeletal: Normal range of motion. She exhibits tenderness.  No patella tenderness, no fibular head tenderness, ambulatory,  2+ DP pulses, negative SLR, anterior tenderness of the knee below the patella, no signs of joint effusion or ecchymosis, no calf tenderness, no leg swelling.   Neurological: She is alert and oriented to person, place, and time.  Skin: Skin is warm and dry.  Psychiatric: She has a normal mood and affect. Her behavior is normal.  Nursing note and vitals reviewed.   ED Course  Procedures (including critical care time) DIAGNOSTIC STUDIES: Oxygen Saturation is 97% on RA, normal  by my interpretation.    COORDINATION OF CARE: 5:13 PM-Discussed treatment plan with pt at bedside and pt agreed to plan.     Labs Review Labs Reviewed - No data to display  Imaging Review No results found.   EKG Interpretation None      MDM   Seen by orthopedics on 09/03/14 by Dr Rolena Infante her orthopedist for transforaminal lumbar fusion at L4-S1. Given 60 tablets of oxycodone 5mg .   Final diagnoses:  Right knee pain  Patient presents for right knee pain and has been evaluated for this by orthopedics. She was given a cortisone injection 2 weeks ago. She has no concerning signs or symptoms. Her exam is normal and she is ambulatory. She takes oxycodone for pain from a  recent back surgery that she had at the end of May and states she still has pills left over. I reviewed [place] knee rules. Medications  ketorolac (TORADOL) 30 MG/ML injection 30 mg (30 mg Intramuscular Given 11/04/14 1724)   I discussed that I would not give her any narcotic pain medications that she are to had some. I also discussed following up with her PCP or orthopedist regarding possible physical therapy. Patient verbally agrees with the plan.  I personally performed the services described in this documentation, which was scribed in my presence. The recorded information has been reviewed and is accurate.      Brandi Glazier, PA-C 11/04/14 1759  Carmin Muskrat, MD 11/04/14 [phone]

## 2014-11-04 NOTE — Discharge Instructions (Signed)
Knee Pain Follow up with your orthopedist or your primary care physician for knee pain. You may need physical therapy. The knee is the complex joint between your thigh and your lower leg. It is made up of bones, tendons, ligaments, and cartilage. The bones that make up the knee are:  The femur in the thigh.  The tibia and fibula in the lower leg.  The patella or kneecap riding in the groove on the lower femur. CAUSES  Knee pain is a common complaint with many causes. A few of these causes are:  Injury, such as:  A ruptured ligament or tendon injury.  Torn cartilage.  Medical conditions, such as:  Gout  Arthritis  Infections  Overuse, over training, or overdoing a physical activity. Knee pain can be minor or severe. Knee pain can accompany debilitating injury. Minor knee problems often respond well to self-care measures or get well on their own. More serious injuries may need medical intervention or even surgery. SYMPTOMS The knee is complex. Symptoms of knee problems can vary widely. Some of the problems are:  Pain with movement and weight bearing.  Swelling and tenderness.  Buckling of the knee.  Inability to straighten or extend your knee.  Your knee locks and you cannot straighten it.  Warmth and redness with pain and fever.  Deformity or dislocation of the kneecap. DIAGNOSIS  Determining what is wrong may be very straight forward such as when there is an injury. It can also be challenging because of the complexity of the knee. Tests to make a diagnosis may include:  Your caregiver taking a history and doing a physical exam.  Routine X-rays can be used to rule out other problems. X-rays will not reveal a cartilage tear. Some injuries of the knee can be diagnosed by:  Arthroscopy a surgical technique by which a small video camera is inserted through tiny incisions on the sides of the knee. This procedure is used to examine and repair internal knee joint problems.  Tiny instruments can be used during arthroscopy to repair the torn knee cartilage (meniscus).  Arthrography is a radiology technique. A contrast liquid is directly injected into the knee joint. Internal structures of the knee joint then become visible on X-ray film.  An MRI scan is a non X-ray radiology procedure in which magnetic fields and a computer produce two- or three-dimensional images of the inside of the knee. Cartilage tears are often visible using an MRI scanner. MRI scans have largely replaced arthrography in diagnosing cartilage tears of the knee.  Blood work.  Examination of the fluid that helps to lubricate the knee joint (synovial fluid). This is done by taking a sample out using a needle and a syringe. TREATMENT The treatment of knee problems depends on the cause. Some of these treatments are:  Depending on the injury, proper casting, splinting, surgery, or physical therapy care will be needed.  Give yourself adequate recovery time. Do not overuse your joints. If you begin to get sore during workout routines, back off. Slow down or do fewer repetitions.  For repetitive activities such as cycling or running, maintain your strength and nutrition.  Alternate muscle groups. For example, if you are a weight lifter, work the upper body on one day and the lower body the next.  Either tight or weak muscles do not give the proper support for your knee. Tight or weak muscles do not absorb the stress placed on the knee joint. Keep the muscles surrounding the knee strong.  Take care of mechanical problems.  If you have flat feet, orthotics or special shoes may help. See your caregiver if you need help.  Arch supports, sometimes with wedges on the inner or outer aspect of the heel, can help. These can shift pressure away from the side of the knee most bothered by osteoarthritis.  A brace called an "unloader" brace also may be used to help ease the pressure on the most arthritic side  of the knee.  If your caregiver has prescribed crutches, braces, wraps or ice, use as directed. The acronym for this is PRICE. This means protection, rest, ice, compression, and elevation.  Nonsteroidal anti-inflammatory drugs (NSAIDs), can help relieve pain. But if taken immediately after an injury, they may actually increase swelling. Take NSAIDs with food in your stomach. Stop them if you develop stomach problems. Do not take these if you have a history of ulcers, stomach pain, or bleeding from the bowel. Do not take without your caregiver's approval if you have problems with fluid retention, heart failure, or kidney problems.  For ongoing knee problems, physical therapy may be helpful.  Glucosamine and chondroitin are over-the-counter dietary supplements. Both may help relieve the pain of osteoarthritis in the knee. These medicines are different from the usual anti-inflammatory drugs. Glucosamine may decrease the rate of cartilage destruction.  Injections of a corticosteroid drug into your knee joint may help reduce the symptoms of an arthritis flare-up. They may provide pain relief that lasts a few months. You may have to wait a few months between injections. The injections do have a small increased risk of infection, water retention, and elevated blood sugar levels.  Hyaluronic acid injected into damaged joints may ease pain and provide lubrication. These injections may work by reducing inflammation. A series of shots may give relief for as long as 6 months.  Topical painkillers. Applying certain ointments to your skin may help relieve the pain and stiffness of osteoarthritis. Ask your pharmacist for suggestions. Many over the-counter products are approved for temporary relief of arthritis pain.  In some countries, doctors often prescribe topical NSAIDs for relief of chronic conditions such as arthritis and tendinitis. A review of treatment with NSAID creams found that they worked as well as  oral medications but without the serious side effects. PREVENTION  Maintain a healthy weight. Extra pounds put more strain on your joints.  Get strong, stay limber. Weak muscles are a common cause of knee injuries. Stretching is important. Include flexibility exercises in your workouts.  Be smart about exercise. If you have osteoarthritis, chronic knee pain or recurring injuries, you may need to change the way you exercise. This does not mean you have to stop being active. If your knees ache after jogging or playing basketball, consider switching to swimming, water aerobics, or other low-impact activities, at least for a few days a week. Sometimes limiting high-impact activities will provide relief.  Make sure your shoes fit well. Choose footwear that is right for your sport.  Protect your knees. Use the proper gear for knee-sensitive activities. Use kneepads when playing volleyball or laying carpet. Buckle your seat belt every time you drive. Most shattered kneecaps occur in car accidents.  Rest when you are tired. SEEK MEDICAL CARE IF:  You have knee pain that is continual and does not seem to be getting better.  SEEK IMMEDIATE MEDICAL CARE IF:  Your knee joint feels hot to the touch and you have a high fever. MAKE SURE YOU:   Understand  these instructions.  Will watch your condition.  Will get help right away if you are not doing well or get worse. Document Released: 01/24/2007 Document Revised: 06/21/2011 Document Reviewed: 01/24/2007 [facility] Patient Information 2015 [place], [place]. This information is not intended to replace advice given to you by your health care provider. Make sure you discuss any questions you have with your health care provider.

## 2014-11-04 NOTE — ED Notes (Signed)
The paient said her right has been hurting since June when she had back surgery.  The patient said she received a cortisone shot two weeks ago and she is still hurting.  She went to [facility] and they said there were no available appointments until the 15th.  She cannot wait because she is in so much pain.  She rates her pain 10/10.

## 2014-12-17 ENCOUNTER — Emergency Department (HOSPITAL_COMMUNITY)
Admission: EM | Admit: 2014-12-17 | Discharge: 2014-12-17 | Disposition: A | Discharge status: Home / Self Care | Payer: Medicaid Other | Attending: Emergency Medicine | Admitting: Emergency Medicine

## 2014-12-17 ENCOUNTER — Encounter (HOSPITAL_COMMUNITY): Payer: Self-pay | Admitting: Emergency Medicine

## 2014-12-17 ENCOUNTER — Emergency Department (HOSPITAL_COMMUNITY): Payer: Medicaid Other

## 2014-12-17 DIAGNOSIS — I10 Essential (primary) hypertension: Secondary | ICD-10-CM | POA: Diagnosis not present

## 2014-12-17 DIAGNOSIS — M79604 Pain in right leg: Secondary | ICD-10-CM

## 2014-12-17 DIAGNOSIS — M79601 Pain in right arm: Secondary | ICD-10-CM

## 2014-12-17 DIAGNOSIS — J45909 Unspecified asthma, uncomplicated: Secondary | ICD-10-CM | POA: Insufficient documentation

## 2014-12-17 DIAGNOSIS — Z8742 Personal history of other diseases of the female genital tract: Secondary | ICD-10-CM | POA: Insufficient documentation

## 2014-12-17 DIAGNOSIS — M545 Low back pain: Secondary | ICD-10-CM | POA: Diagnosis not present

## 2014-12-17 DIAGNOSIS — M79661 Pain in right lower leg: Secondary | ICD-10-CM | POA: Diagnosis present

## 2014-12-17 DIAGNOSIS — M25561 Pain in right knee: Secondary | ICD-10-CM | POA: Insufficient documentation

## 2014-12-17 DIAGNOSIS — G8929 Other chronic pain: Secondary | ICD-10-CM | POA: Diagnosis not present

## 2014-12-17 DIAGNOSIS — R011 Cardiac murmur, unspecified: Secondary | ICD-10-CM | POA: Diagnosis not present

## 2014-12-17 MED ORDER — IBUPROFEN 800 MG PO TABS: 800.0000 mg | ORAL_TABLET | Freq: Three times a day (TID) | ORAL | Status: DC | PRN

## 2014-12-17 MED ORDER — KETOROLAC TROMETHAMINE 60 MG/2ML IM SOLN
60.0000 mg | Freq: Once | INTRAMUSCULAR | Status: AC
  Administered 2014-12-17: 60 mg via INTRAMUSCULAR
  Filled 2014-12-17: qty 2

## 2014-12-17 MED ORDER — METHOCARBAMOL 500 MG PO TABS: 500.0000 mg | ORAL_TABLET | Freq: Four times a day (QID) | ORAL | Status: DC | PRN

## 2014-12-17 MED ORDER — METHOCARBAMOL 500 MG PO TABS
1000.0000 mg | ORAL_TABLET | ORAL | Status: AC
  Administered 2014-12-17: 1000 mg via ORAL
  Filled 2014-12-17: qty 2

## 2014-12-17 NOTE — Discharge Instructions (Signed)
Read the information below.  Use the prescribed medication as directed.  Please discuss all new medications with your pharmacist.  You may return to the Emergency Department at any time for worsening condition or any new symptoms that concern you.   If you develop uncontrolled pain, weakness or numbness of the extremity, severe discoloration of the skin, or you are unable to walk, return to the ER for a recheck.    ° ° ° °Chronic Pain °Chronic pain can be defined as pain that is off and on and lasts for 3-6 months or longer. Many things cause chronic pain, which can make it difficult to make a diagnosis. There are many treatment options available for chronic pain. However, finding a treatment that works well for you may require trying various approaches until the right one is found. Many people benefit from a combination of two or more types of treatment to control their pain. °SYMPTOMS  °Chronic pain can occur anywhere in the body and can range from mild to very severe. Some types of chronic pain include: °· Headache. °· Low back pain. °· Cancer pain. °· Arthritis pain. °· Neurogenic pain. This is pain resulting from damage to nerves. ° People with chronic pain may also have other symptoms such as: °· Depression. °· Anger. °· Insomnia. °· Anxiety. °DIAGNOSIS  °Your health care provider will help diagnose your condition over time. In many cases, the initial focus will be on excluding possible conditions that could be causing the pain. Depending on your symptoms, your health care provider may order tests to diagnose your condition. Some of these tests may include:  °· Blood tests.   °· CT scan.   °· MRI.   °· X-rays.   °· Ultrasounds.   °· Nerve conduction studies.   °You may need to see a specialist.  °TREATMENT  °Finding treatment that works well may take time. You may be referred to a pain specialist. He or she may prescribe medicine or therapies, such as:  °· Mindful meditation or yoga. °· Shots (injections) of  numbing or pain-relieving medicines into the spine or area of pain. °· Local electrical stimulation. °· Acupuncture.   °· Massage therapy.   °· Aroma, color, light, or sound therapy.   °· Biofeedback.   °· Working with a physical therapist to keep from getting stiff.   °· Regular, gentle exercise.   °· Cognitive or behavioral therapy.   °· Group support.   °Sometimes, surgery may be recommended.  °HOME CARE INSTRUCTIONS  °· Take all medicines as directed by your health care provider.   °· Lessen stress in your life by relaxing and doing things such as listening to calming music.   °· Exercise or be active as directed by your health care provider.   °· Eat a healthy diet and include things such as vegetables, fruits, fish, and lean meats in your diet.   °· Keep all follow-up appointments with your health care provider.   °· Attend a support group with others suffering from chronic pain. °SEEK MEDICAL CARE IF:  °· Your pain gets worse.   °· You develop a new pain that was not there before.   °· You cannot tolerate medicines given to you by your health care provider.   °· You have new symptoms since your last visit with your health care provider.   °SEEK IMMEDIATE MEDICAL CARE IF:  °· You feel weak.   °· You have decreased sensation or numbness.   °· You lose control of bowel or bladder function.   °· Your pain suddenly gets much worse.   °· You develop shaking. °· You develop chills. °·   You develop confusion. °· You develop chest pain. °· You develop shortness of breath.   °MAKE SURE YOU: °· Understand these instructions. °· Will watch your condition. °· Will get help right away if you are not doing well or get worse. °Document Released: 12/19/2001 Document Revised: 11/29/2012 Document Reviewed: 09/22/2012 °[employer]® Patient Information ©2015 [employer]. This information is not intended to replace advice given to you by your health care provider. Make sure you discuss any questions you have with your health care  provider. ° °

## 2014-12-17 NOTE — ED Provider Notes (Signed)
CSN: [id number]     Arrival date & time 12/17/14  1715 History  This chart was scribed for Clayton Bibles, working with Forde Dandy, MD by Steva Colder, ED Scribe. The patient was seen in room TR05C/TR05C at 6:28 PM.    Chief Complaint  Patient presents with  . Knee Pain       The history is provided by the patient. No language interpreter was used.    Brandi Chase is a 49 y.o. female who presents to the Emergency Department complaining of right leg pain that began the day after lumbar surgery 08/30/2014.  The pain is located over the right proximal shin and radiates up around the lateral knee and into the lateral thigh.  Pain occurs with weight bearing.  She notes mild edema to the lateral knee and that she feels the knee "sliding" when she is walking.  Was referred by her orthopedist Dr Rolena Infante to a rheumatologist for this problem, was diagnosed with IT band syndrome.  She notes that she has tried 600 mg ibuprofen, tramadol, and robaxin once daily with no relief of her symptoms.  Pt denies injuries, heavy lifting, or falls since the onset of her symptoms. She denies leg swelling, color change, rash, fever, chills, bowel/bladder incontinence, and any other symptoms.   The pain is unchanged in terms of quality and location since it began but has increased in intensity over the past week.    Past Medical History  Diagnosis Date  . Asthma   . Adnexal cyst 2011    removed  . Hip pain, right   . Complication of anesthesia     difficult to awake  . Hypertension   . Family history of adverse reaction to anesthesia     "1 sister and both parents hard to wake up"  . Heart murmur     "dx'd when I was a teenager; haven't had any problems w/it"  . Anemia   . Daily headache 12/2013    "tx'd and no problems anymore" (08/29/2014)  . Arthritis     "neck; spine" (08/29/2014)  . Chronic lower back pain    Past Surgical History  Procedure Laterality Date  . Transforaminal lumbar interbody fusion  (tlif) with pedicle screw fixation 2 level  08/29/2014    L4-S1  . Back surgery    . Reduction mammaplasty Bilateral 1995  . Ovarian cyst removal Right 2011  . Tubal ligation  1992  . Transforaminal lumbar interbody fusion (tlif) with pedicle screw fixation 2 level N/A 08/29/2014    Procedure: TRANSFORAMINAL LUMBAR INTERBODY FUSION (TLIF) L4-S1;  Surgeon: Melina Schools, MD;  Location: [place];  Service: Orthopedics;  Laterality: N/A;   History reviewed. No pertinent family history. Social History  Substance Use Topics  . Smoking status: Never Smoker   . Smokeless tobacco: Never Used  . Alcohol Use: No   OB History    No data available     Review of Systems  Constitutional: Negative for fever.  Cardiovascular: Negative for leg swelling.  Gastrointestinal: Negative for abdominal pain.       No bowel incontinence  Genitourinary:       No bladder incontinence  Musculoskeletal: Positive for back pain, joint swelling and arthralgias.  Skin: Negative for color change, rash and wound.  Allergic/Immunologic: Negative for immunocompromised state.  Hematological: Does not bruise/bleed easily.  Psychiatric/Behavioral: Negative for self-injury.      Allergies  Other; Primatene mist; Aleve; Midol; Avocado; Bactrim ds; Cocoa butter; and  Latex  Home Medications   Prior to Admission medications   Medication Sig Start Date End Date Taking? Authorizing Provider  albuterol (PROVENTIL HFA;VENTOLIN HFA) 108 (90 BASE) MCG/ACT inhaler Inhale 2 puffs into the lungs every 6 (six) hours as needed for wheezing or shortness of breath.    Historical Provider, MD  Calcium Carbonate (CALTRATE 600 PO) Take 600 mg by mouth every evening.     Historical Provider, MD  Cholecalciferol (VITAMIN D) 2000 UNITS tablet Take 2,000 Units by mouth every morning.     Historical Provider, MD  ferrous sulfate 325 (65 FE) MG tablet Take 325 mg by mouth every morning.     Historical Provider, MD  ibuprofen (ADVIL,MOTRIN)  600 MG tablet Take 600 mg by mouth daily as needed for headache or moderate pain.     Historical Provider, MD  magnesium oxide (MAG-OX) 400 MG tablet Take 400 mg by mouth every morning.     Historical Provider, MD  methocarbamol (ROBAXIN) 500 MG tablet Take 1 tablet (500 mg total) by mouth every 6 (six) hours as needed for muscle spasms. 08/30/14   Amber Constable, PA-C  metoprolol tartrate (LOPRESSOR) 25 MG tablet Take 12.5 mg by mouth 2 (two) times daily.    Historical Provider, MD  ondansetron (ZOFRAN) 4 MG tablet Take 1 tablet (4 mg total) by mouth every 8 (eight) hours as needed for nausea or vomiting. 09/03/14   Melina Schools, MD  oxyCODONE (OXY IR/ROXICODONE) 5 MG immediate release tablet Take 1-3 tablets (5-15 mg total) by mouth every 4 (four) hours as needed for severe pain. 08/30/14   Amber Constable, PA-C  polyethylene glycol (MIRALAX / GLYCOLAX) packet Take 17 g by mouth daily. 09/03/14   Melina Schools, MD  POTASSIUM PO Take 1 tablet by mouth every morning. Over the counter product (does not know strength    Historical Provider, MD   BP 140/72 mmHg  Pulse 84  Temp(Src) 97.3 F (36.3 C) (Oral)  Resp 18  SpO2 100% Physical Exam  Constitutional: She appears well-developed and well-nourished. No distress.  HENT:  Head: Normocephalic and atraumatic.  Neck: Neck supple.  Cardiovascular: Intact distal pulses.   Pulmonary/Chest: Effort normal.  Musculoskeletal:       Right knee: She exhibits no swelling, no erythema, no LCL laxity and no MCL laxity. Tenderness found.       Right lower leg: She exhibits no swelling.  Spine nontender, no crepitus, or stepoffs. Mild tenderness to superior lateral aspect of right knee which increases with valgus stress. No swelling of the leg. No erythema. No other tenderness noted. Distal pulses intact. Sensation intact. No laxity of the right knee.   Neurological: She is alert.  Skin: She is not diaphoretic.  Nursing note and vitals reviewed.   ED Course   Procedures (including critical care time) DIAGNOSTIC STUDIES: Oxygen Saturation is 100% on RA, nl by my interpretation.    COORDINATION OF CARE: 6:35 PM Discussed treatment plan with pt at bedside and pt agreed to plan.   Labs Review Labs Reviewed - No data to display  Imaging Review Dg Knee Complete 4 Views Right  12/17/2014   CLINICAL DATA:  49 year old female with right knee pain and numbness  EXAM: RIGHT KNEE - COMPLETE 4+ VIEW  COMPARISON:  None.  FINDINGS: There is no evidence of fracture, dislocation, or joint effusion. There is no evidence of arthropathy or other focal bone abnormality. Soft tissues are unremarkable.  IMPRESSION: Negative.   Electronically Signed  By: Anner Crete M.D.   On: 12/17/2014 21:10   Clayton Bibles, PA-C has personally reviewed and evaluated these images and lab results as part of my medical decision-making.    EKG Interpretation None      MDM   Final diagnoses:  Chronic leg pain, right    Afebrile, nontoxic patient with chronic pain in her right leg since lumbar surgery 3 months ago.  Increase in severity this week with no new injury or trauma.  Previously diagnosed with IT band syndrome.  Knee xray ordered as pt feels it is "sliding."  Neurovascularly intact. Clinically doubt DVT.   Pt reports low back pain is actually improved.  No red flags for back pain.  Pt advised she will need to follow up with Dr Rolena Infante or her rheumatologist or PCP for continued management.  She is on ibuprofen, robaxin qHS, and tramadol at home.  Pt states she has had an injection that she thinks was toradol in the past, this helped with her pain, denies allergy to the medication and would like to try it - IM toradol given prior to d/c. D/C home with slight adjustment in home meds, increased robaxin and ibuprofen, orthopedic/PCP follow up.  Discussed result, findings, treatment, and follow up  with patient.  Pt given return precautions.  Pt verbalizes understanding and  agrees with plan.         I personally performed the services described in this documentation, which was scribed in my presence. The recorded information has been reviewed and is accurate.     Clayton Bibles, PA-C 12/17/14 2124  Forde Dandy, MD 12/18/14 [phone]

## 2014-12-17 NOTE — ED Notes (Signed)
Pt sts right knee pain that is chronic in nature since having back sx that has been worse over last several days per pt; pt denies new injury

## 2015-03-08 ENCOUNTER — Emergency Department (HOSPITAL_COMMUNITY): Payer: Medicaid Other

## 2015-03-08 ENCOUNTER — Encounter (HOSPITAL_COMMUNITY): Payer: Self-pay | Admitting: Neurology

## 2015-03-08 ENCOUNTER — Emergency Department (HOSPITAL_COMMUNITY)
Admission: EM | Admit: 2015-03-08 | Discharge: 2015-03-08 | Disposition: A | Discharge status: Home / Self Care | Payer: Medicaid Other | Attending: Emergency Medicine | Admitting: Emergency Medicine

## 2015-03-08 DIAGNOSIS — G8929 Other chronic pain: Secondary | ICD-10-CM | POA: Insufficient documentation

## 2015-03-08 DIAGNOSIS — W182XXA Fall in (into) shower or empty bathtub, initial encounter: Secondary | ICD-10-CM | POA: Insufficient documentation

## 2015-03-08 DIAGNOSIS — J45909 Unspecified asthma, uncomplicated: Secondary | ICD-10-CM | POA: Diagnosis not present

## 2015-03-08 DIAGNOSIS — Y93E1 Activity, personal bathing and showering: Secondary | ICD-10-CM | POA: Diagnosis not present

## 2015-03-08 DIAGNOSIS — Z8742 Personal history of other diseases of the female genital tract: Secondary | ICD-10-CM | POA: Diagnosis not present

## 2015-03-08 DIAGNOSIS — R011 Cardiac murmur, unspecified: Secondary | ICD-10-CM | POA: Diagnosis not present

## 2015-03-08 DIAGNOSIS — Y92091 Bathroom in other non-institutional residence as the place of occurrence of the external cause: Secondary | ICD-10-CM | POA: Insufficient documentation

## 2015-03-08 DIAGNOSIS — I10 Essential (primary) hypertension: Secondary | ICD-10-CM | POA: Diagnosis not present

## 2015-03-08 DIAGNOSIS — Y998 Other external cause status: Secondary | ICD-10-CM | POA: Diagnosis not present

## 2015-03-08 DIAGNOSIS — D649 Anemia, unspecified: Secondary | ICD-10-CM | POA: Diagnosis not present

## 2015-03-08 DIAGNOSIS — M1611 Unilateral primary osteoarthritis, right hip: Secondary | ICD-10-CM | POA: Insufficient documentation

## 2015-03-08 DIAGNOSIS — S79911A Unspecified injury of right hip, initial encounter: Secondary | ICD-10-CM | POA: Diagnosis present

## 2015-03-08 DIAGNOSIS — S299XXA Unspecified injury of thorax, initial encounter: Secondary | ICD-10-CM | POA: Insufficient documentation

## 2015-03-08 DIAGNOSIS — S4992XA Unspecified injury of left shoulder and upper arm, initial encounter: Secondary | ICD-10-CM | POA: Insufficient documentation

## 2015-03-08 DIAGNOSIS — Z79899 Other long term (current) drug therapy: Secondary | ICD-10-CM | POA: Insufficient documentation

## 2015-03-08 DIAGNOSIS — M25451 Effusion, right hip: Secondary | ICD-10-CM | POA: Diagnosis not present

## 2015-03-08 DIAGNOSIS — Z91040 Latex allergy status: Secondary | ICD-10-CM | POA: Insufficient documentation

## 2015-03-08 MED ORDER — OXYCODONE-ACETAMINOPHEN 5-325 MG PO TABS: 1.0000 | ORAL_TABLET | ORAL | Status: DC | PRN

## 2015-03-08 MED ORDER — OXYCODONE-ACETAMINOPHEN 5-325 MG PO TABS
2.0000 | ORAL_TABLET | Freq: Once | ORAL | Status: AC
Start: 2015-03-08 — End: 2015-03-08
  Administered 2015-03-08: 2 via ORAL
  Filled 2015-03-08: qty 2

## 2015-03-08 NOTE — ED Notes (Signed)
Pt reports this morning she slipped getting out of the shower. C/o right hip pain and left shoulder pain. Denies LOC, no head injury. Pt is a x 4

## 2015-03-08 NOTE — ED Notes (Signed)
Pt off unit with xray 

## 2015-03-08 NOTE — ED Provider Notes (Signed)
CSN: [id number]     Arrival date & time 03/08/15  [id number] History   First MD Initiated Contact with Patient 03/08/15 [phone]     Chief Complaint  Patient presents with  . Hip Pain  . Shoulder Pain     (Consider location/radiation/quality/duration/timing/severity/associated sxs/prior Treatment) HPI   Patient is a 49 year old female with history of asthma, chronic low back and right hip pain, who presents to emergency room after falling out of the shower. She states that she was trying to lift her right leg up and over the tub edge and it got caught causing her to fall out. Her right leg was stuck inside the tub and she fell out rotating and falling onto her left shoulder. She complains of severe pain in her right hip that radiates to her groin and sending burning pain down her leg to the outside of her right knee. She has mild discomfort to her left shoulder, no deformity and normal use of her left shoulder and arm.  She denies hitting her head, loss of consciousness, nausea or vomiting since falling.  She was not able to get herself up off of the floor and family members assisted her. She has not been able to bear her full weight on her right leg, due to pain. She states prior to her accident she is has chronic hip pain worse with external rotation. She is been dealing with worsening pain for the past 6 months and is taking NSAIDs and tramadol daily, her orthopedic surgeon is Dr. Lyla Glassing.  Past Medical History  Diagnosis Date  . Asthma   . Adnexal cyst 2011    removed  . Hip pain, right   . Complication of anesthesia     difficult to awake  . Hypertension   . Family history of adverse reaction to anesthesia     "1 sister and both parents hard to wake up"  . Heart murmur     "dx'd when I was a teenager; haven't had any problems w/it"  . Anemia   . Daily headache 12/2013    "tx'd and no problems anymore" (08/29/2014)  . Arthritis     "neck; spine" (08/29/2014)  . Chronic lower back pain     Past Surgical History  Procedure Laterality Date  . Transforaminal lumbar interbody fusion (tlif) with pedicle screw fixation 2 level  08/29/2014    L4-S1  . Back surgery    . Reduction mammaplasty Bilateral 1995  . Ovarian cyst removal Right 2011  . Tubal ligation  1992  . Transforaminal lumbar interbody fusion (tlif) with pedicle screw fixation 2 level N/A 08/29/2014    Procedure: TRANSFORAMINAL LUMBAR INTERBODY FUSION (TLIF) L4-S1;  Surgeon: Melina Schools, MD;  Location: [facility];  Service: Orthopedics;  Laterality: N/A;   No family history on file. Social History  Substance Use Topics  . Smoking status: Never Smoker   . Smokeless tobacco: Never Used  . Alcohol Use: No   OB History    No data available     Review of Systems  Constitutional: Negative.   HENT: Negative.   Respiratory: Negative.  Negative for choking, chest tightness and shortness of breath.   Cardiovascular: Negative.  Negative for chest pain.  Gastrointestinal: Negative.  Negative for nausea, vomiting and abdominal pain.  Genitourinary: Negative.   Musculoskeletal: Positive for back pain and arthralgias. Negative for neck pain and neck stiffness.       Chronic back pain  Skin: Negative.  Negative for color change and  pallor.  Neurological: Negative for dizziness, syncope, weakness, light-headedness, numbness and headaches.  Psychiatric/Behavioral: Negative.       Allergies  Other; Primatene mist; Aleve; Midol; Avocado; Bactrim ds; Cocoa butter; and Latex  Home Medications   Prior to Admission medications   Medication Sig Start Date End Date Taking? Authorizing Provider  albuterol (PROVENTIL HFA;VENTOLIN HFA) 108 (90 BASE) MCG/ACT inhaler Inhale 2 puffs into the lungs every 6 (six) hours as needed for wheezing or shortness of breath.   Yes Historical Provider, MD  Calcium Carbonate (CALTRATE 600 PO) Take 600 mg by mouth every evening.    Yes Historical Provider, MD  Cholecalciferol (VITAMIN D) 2000  UNITS tablet Take 2,000 Units by mouth every morning.    Yes Historical Provider, MD  ferrous sulfate 325 (65 FE) MG tablet Take 325 mg by mouth every morning.    Yes Historical Provider, MD  ibuprofen (ADVIL,MOTRIN) 600 MG tablet Take 600 mg by mouth 2 (two) times daily.   Yes Historical Provider, MD  magnesium oxide (MAG-OX) 400 MG tablet Take 400 mg by mouth every morning.    Yes Historical Provider, MD  methocarbamol (ROBAXIN) 500 MG tablet Take 500 mg by mouth 2 (two) times daily.   Yes Historical Provider, MD  metoprolol tartrate (LOPRESSOR) 25 MG tablet Take 12.5 mg by mouth 2 (two) times daily.   Yes Historical Provider, MD  POTASSIUM PO Take 1 tablet by mouth every morning. Over the counter product (does not know strength   Yes Historical Provider, MD  traMADol (ULTRAM) 50 MG tablet Take 50 mg by mouth 2 (two) times daily.   Yes Historical Provider, MD  oxyCODONE-acetaminophen (PERCOCET) 5-325 MG tablet Take 1 tablet by mouth every 4 (four) hours as needed. 03/08/15   Delsa Grana, PA-C   BP 172/109 mmHg  Pulse 76  Temp(Src) 98.6 F (37 C) (Oral)  Resp 14  SpO2 100%  LMP 02/24/2015 (Approximate) Physical Exam  Constitutional: She is oriented to person, place, and time. She appears well-developed and well-nourished. No distress.  HENT:  Head: Normocephalic and atraumatic.  Right Ear: External ear normal.  Left Ear: External ear normal.  Nose: Nose normal.  Mouth/Throat: Oropharynx is clear and moist. No oropharyngeal exudate.  Eyes: Conjunctivae and EOM are normal. Pupils are equal, round, and reactive to light. Right eye exhibits no discharge. Left eye exhibits no discharge. No scleral icterus.  Neck: Normal range of motion. Neck supple. No JVD present. No tracheal deviation present.  Cardiovascular: Normal rate and regular rhythm.   Symmetric dorsal pedis and radial pulses 2+  Pulmonary/Chest: Effort normal and breath sounds normal. No accessory muscle usage or stridor. No  tachypnea. No respiratory distress. She has no decreased breath sounds. She exhibits tenderness. She exhibits no deformity and no swelling.    Musculoskeletal: She exhibits tenderness. She exhibits no edema.       Left shoulder: Normal. She exhibits normal range of motion, no tenderness, no bony tenderness, no swelling, no effusion, no crepitus, no deformity, normal pulse and normal strength.       Right hip: She exhibits decreased range of motion and tenderness.       Right knee: She exhibits normal range of motion, no swelling, no effusion, no ecchymosis, no deformity and normal patellar mobility. Tenderness found. Lateral joint line, LCL and patellar tendon tenderness noted.  Right hip flexion decreased, limited internal and external rotation Exam in gurney limited by pain, unable to bear weight  Lymphadenopathy:  She has no cervical adenopathy.  Neurological: She is alert and oriented to person, place, and time. No cranial nerve deficit. She exhibits normal muscle tone. Coordination normal.  Normal sensation to light touch in all extremities   Skin: Skin is warm and dry. No rash noted. She is not diaphoretic. No erythema. No pallor.  Psychiatric: She has a normal mood and affect. Her behavior is normal. Judgment and thought content normal.    ED Course  Procedures (including critical care time) Labs Review Labs Reviewed - No data to display  Imaging Review Ct Hip Right Wo Contrast  03/08/2015  CLINICAL DATA:  Status post fall today. Chronic right hip pain. Question fracture. Initial encounter. EXAM: CT OF THE RIGHT HIP WITHOUT CONTRAST TECHNIQUE: Multidetector CT imaging of the right hip was performed according to the standard protocol. Multiplanar CT image reconstructions were also generated. COMPARISON:  None. FINDINGS: The right hip is located. No fracture is identified. The patient has severe and markedly advanced for age right hip osteoarthritis with bone-on-bone joint space  narrowing and extensive subchondral sclerosis and cyst formation. The joint may be partially ankylosed. Prominent osteophytosis is present about the joint. A right hip joint effusion is identified. Imaged muscles and tendons appear normal. Imaged intrapelvic contents are unremarkable. IMPRESSION: No acute abnormality. Severe right hip osteoarthritis. Right hip joint effusion is likely related to degenerative disease. Electronically Signed   By: Inge Rise M.D.   On: 03/08/2015 12:57   Dg Shoulder Left  03/08/2015  CLINICAL DATA:  Status post fall today with a blow to the left shoulder. Pain. Initial encounter. EXAM: LEFT SHOULDER - 2+ VIEW COMPARISON:  None. FINDINGS: The humerus is located and the acromioclavicular joint is intact. No fracture is identified. Mild to moderate acromioclavicular degenerative change is seen. Imaged left lung and ribs appear normal. IMPRESSION: No acute abnormality. Moderate acromioclavicular a stat mild to moderate acromioclavicular degenerative disease. Electronically Signed   By: Inge Rise M.D.   On: 03/08/2015 10:44   Dg Hip Unilat With Pelvis 2-3 Views Right  03/08/2015  CLINICAL DATA:  Medial and lateral right hip pain after fall this morning. History of chronic right hip pain and diagnosed with arthritis. EXAM: DG HIP (WITH OR WITHOUT PELVIS) 2-3V RIGHT COMPARISON:  None. FINDINGS: Surgical hardware in the lower lumbar spine. Mild degenerative changes at the pubic symphysis. The pelvic bony ring is intact. There appears to be chronic deformity of the right femoral head with partial collapse along the superior aspect of the right femoral head. There is also severe joint space narrowing involving the superior right hip joint. Sclerosis involving the right femoral head. The right hip appears to be located without acute fracture. In addition, there appears to be osteophytosis or heterotopic ossifications along the inferior right acetabulum. IMPRESSION: No acute  bone abnormality in the pelvis or right hip. Marked deformity of the right femoral head with severe joint space narrowing. Findings could represent severe osteoarthritic changes but cannot exclude underlying AVN in the right femoral head. Electronically Signed   By: Markus Daft M.D.   On: 03/08/2015 10:40   I have personally reviewed and evaluated these images and lab results as part of my medical decision-making.   EKG Interpretation None      MDM   Final diagnoses:  Osteoarthritis of right hip, unspecified osteoarthritis type  Effusion of hip joint, right   Pt with fall and right hip pain, acute on chronic, and mild left shoulder pain without deformity  She denies head injury, neck pain, LOC, N/V  Xrays obtained which show vast degenerative changes in the pelvis and hips with severe joint space narrowing, sclerosis of the femoral head.  Read states "right hip appears... without acute fracture" however there is marked deformity - will obtain CT.  She was given oral percocet for pain with good response.  Left shoulder negative for fx or dislocation.  CT of the right hip shows severe right hip osteoarthritis with effusion, likely related to degenerative disease there are no visualized acute pathology such as fracture or dislocation. She does not have any symptoms concerning for a septic arthritis, she is afebrile, without erythema. Given her recent fall, she'll be given pain medication and will need to call her orthopedic surgeon on Monday for follow-up.          Delsa Grana, PA-C 03/09/15 2137  Carmin Muskrat, MD 03/15/15 [phone]

## 2015-03-08 NOTE — Discharge Instructions (Signed)
Osteoarthritis Osteoarthritis is a disease that causes soreness and inflammation of a joint. It occurs when the cartilage at the affected joint wears down. Cartilage acts as a cushion, covering the ends of bones where they meet to form a joint. Osteoarthritis is the most common form of arthritis. It often occurs in older people. The joints affected most often by this condition include those in the:  Ends of the fingers.  Thumbs.  Neck.  Lower back.  Knees.  Hips. CAUSES  Over time, the cartilage that covers the ends of bones begins to wear away. This causes bone to rub on bone, producing pain and stiffness in the affected joints.  RISK FACTORS Certain factors can increase your chances of having osteoarthritis, including:  Older age.  Excessive body weight.  Overuse of joints.  Previous joint injury. SIGNS AND SYMPTOMS   Pain, swelling, and stiffness in the joint.  Over time, the joint may lose its normal shape.  Small deposits of bone (osteophytes) may grow on the edges of the joint.  Bits of bone or cartilage can break off and float inside the joint space. This may cause more pain and damage. DIAGNOSIS  Your health care provider will do a physical exam and ask about your symptoms. Various tests may be ordered, such as:  X-rays of the affected joint.  Blood tests to rule out other types of arthritis. Additional tests may be used to diagnose your condition. TREATMENT  Goals of treatment are to control pain and improve joint function. Treatment plans may include:  A prescribed exercise program that allows for rest and joint relief.  A weight control plan.  Pain relief techniques, such as:  Properly applied heat and cold.  Electric pulses delivered to nerve endings under the skin (transcutaneous electrical nerve stimulation [TENS]).  Massage.  Certain nutritional supplements.  Medicines to control pain, such as:  Acetaminophen.  Nonsteroidal  anti-inflammatory drugs (NSAIDs), such as naproxen.  Narcotic or central-acting agents, such as tramadol.  Corticosteroids. These can be given orally or as an injection.  Surgery to reposition the bones and relieve pain (osteotomy) or to remove loose pieces of bone and cartilage. Joint replacement may be needed in advanced states of osteoarthritis. HOME CARE INSTRUCTIONS   Take medicines only as directed by your health care provider.  Maintain a healthy weight. Follow your health care provider's instructions for weight control. This may include dietary instructions.  Exercise as directed. Your health care provider can recommend specific types of exercise. These may include:  Strengthening exercises. These are done to strengthen the muscles that support joints affected by arthritis. They can be performed with weights or with exercise bands to add resistance.  Aerobic activities. These are exercises, such as brisk walking or low-impact aerobics, that get your heart pumping.  Range-of-motion activities. These keep your joints limber.  Balance and agility exercises. These help you maintain daily living skills.  Rest your affected joints as directed by your health care provider.  Keep all follow-up visits as directed by your health care provider. SEEK MEDICAL CARE IF:   Your skin turns red.  You develop a rash in addition to your joint pain.  You have worsening joint pain.  You have a fever along with joint or muscle aches. SEEK IMMEDIATE MEDICAL CARE IF:  You have a significant loss of weight or appetite.  You have night sweats. [place] of Arthritis and Musculoskeletal and Skin Diseases: www.niams.SouthExposed.es  National Institute on  Aging: http://kim-miller.com/  American College of Rheumatology: www.rheumatology.org   This information is not intended to replace advice given to you by your health care provider. Make sure you discuss any questions you  have with your health care provider.   Document Released: 03/29/2005 Document Revised: 04/19/2014 Document Reviewed: 12/04/2012 Elsevier Interactive Patient Education 2016 Elsevier Inc.  Hip Pain Your hip is the joint between your upper legs and your lower pelvis. The bones, cartilage, tendons, and muscles of your hip joint perform a lot of work each day supporting your body weight and allowing you to move around. Hip pain can range from a minor ache to severe pain in one or both of your hips. Pain may be felt on the inside of the hip joint near the groin, or the outside near the buttocks and upper thigh. You may have swelling or stiffness as well.  HOME CARE INSTRUCTIONS   Take medicines only as directed by your health care provider.  Apply ice to the injured area:  Put ice in a plastic bag.  Place a towel between your skin and the bag.  Leave the ice on for 15-20 minutes at a time, 3-4 times a day.  Keep your leg raised (elevated) when possible to lessen swelling.  Avoid activities that cause pain.  Follow specific exercises as directed by your health care provider.  Sleep with a pillow between your legs on your most comfortable side.  Record how often you have hip pain, the location of the pain, and what it feels like. SEEK MEDICAL CARE IF:   You are unable to put weight on your leg.  Your hip is red or swollen or very tender to touch.  Your pain or swelling continues or worsens after 1 week.  You have increasing difficulty walking.  You have a fever. SEEK IMMEDIATE MEDICAL CARE IF:   You have fallen.  You have a sudden increase in pain and swelling in your hip. MAKE SURE YOU:   Understand these instructions.  Will watch your condition.  Will get help right away if you are not doing well or get worse.   This information is not intended to replace advice given to you by your health care provider. Make sure you discuss any questions you have with your health  care provider.   Document Released: 09/16/2009 Document Revised: 04/19/2014 Document Reviewed: 11/23/2012 Elsevier Interactive Patient Education [employer].

## 2015-04-17 ENCOUNTER — Encounter: Payer: Self-pay | Admitting: Internal Medicine

## 2015-04-17 ENCOUNTER — Ambulatory Visit (INDEPENDENT_AMBULATORY_CARE_PROVIDER_SITE_OTHER): Payer: Medicaid Other | Admitting: Internal Medicine

## 2015-04-17 VITALS — BP 158/99 | HR 79 | Temp 98.4°F | Ht 62.0 in | Wt 209.8 lb

## 2015-04-17 DIAGNOSIS — J4520 Mild intermittent asthma, uncomplicated: Secondary | ICD-10-CM | POA: Diagnosis not present

## 2015-04-17 DIAGNOSIS — M545 Low back pain: Secondary | ICD-10-CM | POA: Diagnosis not present

## 2015-04-17 DIAGNOSIS — M170 Bilateral primary osteoarthritis of knee: Secondary | ICD-10-CM

## 2015-04-17 DIAGNOSIS — Z7951 Long term (current) use of inhaled steroids: Secondary | ICD-10-CM | POA: Diagnosis not present

## 2015-04-17 DIAGNOSIS — M1611 Unilateral primary osteoarthritis, right hip: Secondary | ICD-10-CM

## 2015-04-17 DIAGNOSIS — Z981 Arthrodesis status: Secondary | ICD-10-CM | POA: Diagnosis not present

## 2015-04-17 DIAGNOSIS — Z1231 Encounter for screening mammogram for malignant neoplasm of breast: Secondary | ICD-10-CM | POA: Insufficient documentation

## 2015-04-17 DIAGNOSIS — I10 Essential (primary) hypertension: Secondary | ICD-10-CM

## 2015-04-17 DIAGNOSIS — G8929 Other chronic pain: Secondary | ICD-10-CM | POA: Diagnosis not present

## 2015-04-17 DIAGNOSIS — Z0000 Encounter for general adult medical examination without abnormal findings: Secondary | ICD-10-CM | POA: Insufficient documentation

## 2015-04-17 MED ORDER — ALBUTEROL SULFATE HFA 108 (90 BASE) MCG/ACT IN AERS: 2.0000 | INHALATION_SPRAY | Freq: Four times a day (QID) | RESPIRATORY_TRACT | Status: DC | PRN

## 2015-04-17 NOTE — Progress Notes (Signed)
Patient ID: Brandi Chase, female   DOB: 04-29-1965, 50 y.o.   MRN: [id number]    Subjective:   Patient ID: Brandi Chase female   DOB: 25-Aug-1965 50 y.o.   MRN: [id number]  HPI: [name] is a 50 y.o. female with PMH as below, here to establish care in clinic.  Please see Problem-Based charting for the status of the patient's chronic medical issues.  PMH: HTN controlled on Metop 12.5 BID and asthma.   PSH: tubal ligation, breast reduction, ovarian cyst removal (2011)  Allergies: Bactrim (sees things that are not there)  FHx: HTN in father SHx: Lives alone.  Has been on disability since August 2016 2/2 back pain.  She was previously a Community education officer.  She ambulates with a cane and performs her ADLs.  She gets some assistance from her daughter for IADLs.  She denies fever, chills, cough, CP, SOB, N/V, C/D, hematochezia, melena, dysuria, dizziness, or lightheadedness.   Past Medical History  Diagnosis Date  . Asthma   . Adnexal cyst 2011    removed  . Hip pain, right   . Complication of anesthesia     difficult to awake  . Hypertension   . Family history of adverse reaction to anesthesia     "1 sister and both parents hard to wake up"  . Heart murmur     "dx'd when I was a teenager; haven't had any problems w/it"  . Anemia   . Daily headache 12/2013    "tx'd and no problems anymore" (08/29/2014)  . Arthritis     "neck; spine" (08/29/2014)  . Chronic lower back pain    Current Outpatient Prescriptions  Medication Sig Dispense Refill  . albuterol (PROVENTIL HFA;VENTOLIN HFA) 108 (90 BASE) MCG/ACT inhaler Inhale 2 puffs into the lungs every 6 (six) hours as needed for wheezing or shortness of breath.    . Calcium Carbonate (CALTRATE 600 PO) Take 600 mg by mouth every evening.     . Cholecalciferol (VITAMIN D) 2000 UNITS tablet Take 2,000 Units by mouth every morning.     . ferrous sulfate 325 (65 FE) MG tablet Take 325 mg by mouth every morning.     Marland Kitchen  ibuprofen (ADVIL,MOTRIN) 600 MG tablet Take 600 mg by mouth 2 (two) times daily.    . magnesium oxide (MAG-OX) 400 MG tablet Take 400 mg by mouth every morning.     . methocarbamol (ROBAXIN) 500 MG tablet Take 500 mg by mouth 2 (two) times daily.    . metoprolol tartrate (LOPRESSOR) 25 MG tablet Take 12.5 mg by mouth 2 (two) times daily.    Marland Kitchen oxyCODONE-acetaminophen (PERCOCET) 5-325 MG tablet Take 1 tablet by mouth every 4 (four) hours as needed. 20 tablet 0  . POTASSIUM PO Take 1 tablet by mouth every morning. Over the counter product (does not know strength    . traMADol (ULTRAM) 50 MG tablet Take 50 mg by mouth 2 (two) times daily.    . [DISCONTINUED] Potassium 95 MG TABS Take 1 tablet by mouth daily.    . [DISCONTINUED] POTASSIUM CHLORIDE PO Take 1 tablet by mouth daily. OTC     No current facility-administered medications for this visit.   No family history on file. Social History   Social History  . Marital Status: Divorced    Spouse Name: N/A  . Number of Children: N/A  . Years of Education: N/A   Social History Main Topics  . Smoking status: Never Smoker   .  Smokeless tobacco: Never Used  . Alcohol Use: No  . Drug Use: No  . Sexual Activity: Yes   Other Topics Concern  . Not on file   Social History Narrative   Review of Systems: Pertinent items noted in HPI and remainder of comprehensive ROS otherwise negative. Objective:  Physical Exam: There were no vitals filed for this visit. Physical Exam  Constitutional: She is oriented to person, place, and time.  Obese female, sitting in wheelchair, pleasant, NAD  HENT:  Head: Normocephalic and atraumatic.  Eyes: EOM are normal.  Neck: No tracheal deviation present.  Cardiovascular: Normal rate, regular rhythm, normal heart sounds and intact distal pulses.   Pulmonary/Chest: Effort normal and breath sounds normal. No stridor. No respiratory distress. She has no wheezes.  Abdominal: Soft. She exhibits no distension.  There is no tenderness. There is no rebound and no guarding.  Musculoskeletal: She exhibits no edema.  Decreased ROM of right hip limited by pain.  4/5 strength to right hip flexion limited by pain.  5/5 strength to right knee extension.  Neurological: She is alert and oriented to person, place, and time.  Skin: Skin is warm and dry. No rash noted.     Assessment & Plan:   Patient and case were discussed with Dr. Daryll Drown.  Please refer to Problem Based charting for further documentation.

## 2015-04-17 NOTE — Assessment & Plan Note (Signed)
Patient reports no asthma symptoms.  Last time she used her inhaler was 2 years ago.  Last time she had an exacerbation was 8 years ago. She has no concerns.  A/P: Mild, intermittent asthma.  Will refill Albuterol prescription just in case. - Albuterol

## 2015-04-17 NOTE — Progress Notes (Deleted)
Patient ID: Brandi Chase, female   DOB: 12-Apr-1966, 50 y.o.   MRN: [id number]    Subjective:   Patient ID: Brandi Chase female   DOB: 05-15-1965 50 y.o.   MRN: [id number]  HPI: Brandi Chase is a 50 y.o. female with PMH as below, here for ***.  Please see Problem-Based charting for the status of the patient's chronic medical issues.     No past medical history on file. No current outpatient prescriptions on file.   No current facility-administered medications for this visit.   No family history on file. Social History   Social History  . Marital Status: Unknown    Spouse Name: N/A  . Number of Children: N/A  . Years of Education: N/A   Social History Main Topics  . Smoking status: Not on file  . Smokeless tobacco: Not on file  . Alcohol Use: Not on file  . Drug Use: Not on file  . Sexual Activity: Not on file   Other Topics Concern  . Not on file   Social History Narrative  . No narrative on file   Review of Systems: {Review Of Systems:30496} Objective:  Physical Exam: There were no vitals filed for this visit. {Exam, Complete:17964} Assessment & Plan:   Patient and case were discussed with Dr. Marland Kitchen  Please refer to Problem Based charting for further documentation.

## 2015-04-17 NOTE — Assessment & Plan Note (Signed)
Patient has h/o chronic LBP s/p L4-L5 spinal fusion by [facility].  She has had worsening back pain, right hip pain, and bilateral knee pain 2/2 OA.  She was seen by her orthopedist yesterday, who discussed her need for hip replacement.  However, the patient does not have insurance and she cannot have surgery until she has insurance.  She manages her pain with Ibuprofen 600 mg BID and Tramadol 100 mg BID.  However, discharge summary for May 2016 admission states she should stop taking Tramadol.  On exam, decreased strength to hip flexion on right, but 5/5 on knee extension.  A/P: Chronic low back, right hip, and bilateral knee pain 2/2 OA.  Pain currently managed, but patient needs to touch base with her Orthopedist WRT whether she should remain on Tramadol.  Patient will continue to await insurance in order to pursue hip replacement. - Ibuprofen 600 mg BID-TID with/without Tylenol 500 mg q4h PRN

## 2015-04-17 NOTE — Patient Instructions (Signed)
1. You may take Ibuprofen 600 mg up to 3 times daily as needed for pain.  You may supplement with Tylenol 500 mg every 4 hours for added relief. 2. Call you orthopedist regarding Tramadol. 3. Return to clinic in 3 months.  Ibuprofen tablets and capsules What is this medicine? IBUPROFEN (eye BYOO proe fen) is a non-steroidal anti-inflammatory drug (NSAID). It is used for dental pain, fever, headaches or migraines, osteoarthritis, rheumatoid arthritis, or painful monthly periods. It can also relieve minor aches and pains caused by a cold, flu, or sore throat. This medicine may be used for other purposes; ask your health care provider or pharmacist if you have questions. What should I tell my health care provider before I take this medicine? They need to know if you have any of these conditions: -asthma -cigarette smoker -drink more than 3 alcohol containing drinks a day -heart disease or circulation problems such as heart failure or leg edema (fluid retention) -high blood pressure -kidney disease -liver disease -stomach bleeding or ulcers -an unusual or allergic reaction to ibuprofen, aspirin, other NSAIDS, other medicines, foods, dyes, or preservatives -pregnant or trying to get pregnant -breast-feeding How should I use this medicine? Take this medicine by mouth with a glass of water. Follow the directions on the prescription label. Take this medicine with food if your stomach gets upset. Try to not lie down for at least 10 minutes after you take the medicine. Take your medicine at regular intervals. Do not take your medicine more often than directed. A special MedGuide will be given to you by the pharmacist with each prescription and refill. Be sure to read this information carefully each time. Talk to your pediatrician regarding the use of this medicine in children. Special care may be needed. Overdosage: If you think you have taken too much of this medicine contact a poison control center  or emergency room at once. NOTE: This medicine is only for you. Do not share this medicine with others. What if I miss a dose? If you miss a dose, take it as soon as you can. If it is almost time for your next dose, take only that dose. Do not take double or extra doses. What may interact with this medicine? Do not take this medicine with any of the following medications: -cidofovir -ketorolac -methotrexate -pemetrexed This medicine may also interact with the following medications: -alcohol -aspirin -diuretics -lithium -other drugs for inflammation like prednisone -warfarin This list may not describe all possible interactions. Give your health care provider a list of all the medicines, herbs, non-prescription drugs, or dietary supplements you use. Also tell them if you smoke, drink alcohol, or use illegal drugs. Some items may interact with your medicine. What should I watch for while using this medicine? Tell your doctor or healthcare professional if your symptoms do not start to get better or if they get worse. This medicine does not prevent heart attack or stroke. In fact, this medicine may increase the chance of a heart attack or stroke. The chance may increase with longer use of this medicine and in people who have heart disease. If you take aspirin to prevent heart attack or stroke, talk with your doctor or health care professional. Do not take other medicines that contain aspirin, ibuprofen, or naproxen with this medicine. Side effects such as stomach upset, nausea, or ulcers may be more likely to occur. Many medicines available without a prescription should not be taken with this medicine. This medicine can cause ulcers  and bleeding in the stomach and intestines at any time during treatment. Ulcers and bleeding can happen without warning symptoms and can cause death. To reduce your risk, do not smoke cigarettes or drink alcohol while you are taking this medicine. You may get drowsy or  dizzy. Do not drive, use machinery, or do anything that needs mental alertness until you know how this medicine affects you. Do not stand or sit up quickly, especially if you are an older patient. This reduces the risk of dizzy or fainting spells. This medicine can cause you to bleed more easily. Try to avoid damage to your teeth and gums when you brush or floss your teeth. This medicine may be used to treat migraines. If you take migraine medicines for 10 or more days a month, your migraines may get worse. Keep a diary of headache days and medicine use. Contact your healthcare professional if your migraine attacks occur more frequently. What side effects may I notice from receiving this medicine? Side effects that you should report to your doctor or health care professional as soon as possible: -allergic reactions like skin rash, itching or hives, swelling of the face, lips, or tongue -severe stomach pain -signs and symptoms of bleeding such as bloody or black, tarry stools; red or dark-brown urine; spitting up blood or brown material that looks like coffee grounds; red spots on the skin; unusual bruising or bleeding from the eye, gums, or nose -signs and symptoms of a blood clot such as changes in vision; chest pain; severe, sudden headache; trouble speaking; sudden numbness or weakness of the face, arm, or leg -unexplained weight gain or swelling -unusually weak or tired -yellowing of eyes or skin Side effects that usually do not require medical attention (report to your doctor or health care professional if they continue or are bothersome): -bruising -diarrhea -dizziness, drowsiness -headache -nausea, vomiting This list may not describe all possible side effects. Call your doctor for medical advice about side effects. You may report side effects to FDA at 1-800-FDA-1088. Where should I keep my medicine? Keep out of the reach of children. Store at room temperature between 15 and 30 degrees C  (59 and 86 degrees F). Keep container tightly closed. Throw away any unused medicine after the expiration date. NOTE: This sheet is a summary. It may not cover all possible information. If you have questions about this medicine, talk to your doctor, pharmacist, or health care provider.    2016, Elsevier/Gold Standard. (2012-11-28 10:48:02)

## 2015-04-17 NOTE — Assessment & Plan Note (Signed)
Patient has a history of pelvic mass, reportedly 14 lb ovarian cyst removed in 2011.  She was seen by OB in 2015, who ordered a pelvic US, but patient refused it and did not want to reschedule.  She has not followed up.  She denies current abdominal complaint or weight loss.  She believes her last Pap was 2 years ago.  Last mammogram was 2 years ago.  Our records show last Pap being 2010.  Will address at next visit.  A/P: - Pelvic US when patient has insurance - Obtain records regarding last Pap from [facility] hospital

## 2015-04-17 NOTE — Assessment & Plan Note (Signed)
BP Readings from Last 3 Encounters:  04/17/15 158/99  03/08/15 172/109  12/17/14 131/81    Lab Results  Component Value Date   NA 139 08/28/2014   K 3.8 08/28/2014   CREATININE 0.74 08/28/2014    Assessment: Blood pressure control:  moderate Progress toward BP goal:    Comments: patient has not taken her Metoprolol this morning. Patient counseled to take it every morning and afternoon.  Plan: Medications:  continue current medications, Metoprolol tartrate 12.5 mg BID Educational resources provided:   Self management tools provided:   Other plans: recheck BP at next visit.

## 2015-04-18 NOTE — Progress Notes (Signed)
Internal Medicine Clinic Attending  Case discussed with Dr. Taylor at the time of the visit.  We reviewed the resident's history and exam and pertinent patient test results.  I agree with the assessment, diagnosis, and plan of care documented in the resident's note. 

## 2015-04-21 NOTE — Progress Notes (Signed)
This encounter was created in error - please disregard.

## 2015-07-11 ENCOUNTER — Telehealth: Payer: Self-pay

## 2015-07-11 ENCOUNTER — Encounter (HOSPITAL_COMMUNITY): Payer: Self-pay | Admitting: *Deleted

## 2015-07-11 ENCOUNTER — Inpatient Hospital Stay (HOSPITAL_COMMUNITY)
Admission: AD | Admit: 2015-07-11 | Discharge: 2015-07-11 | Disposition: A | Discharge status: Home / Self Care | Payer: Medicaid Other | Source: Ambulatory Visit | Attending: Obstetrics and Gynecology | Admitting: Obstetrics and Gynecology

## 2015-07-11 DIAGNOSIS — D649 Anemia, unspecified: Secondary | ICD-10-CM | POA: Diagnosis not present

## 2015-07-11 DIAGNOSIS — J45909 Unspecified asthma, uncomplicated: Secondary | ICD-10-CM | POA: Insufficient documentation

## 2015-07-11 DIAGNOSIS — Z3202 Encounter for pregnancy test, result negative: Secondary | ICD-10-CM | POA: Insufficient documentation

## 2015-07-11 DIAGNOSIS — M479 Spondylosis, unspecified: Secondary | ICD-10-CM | POA: Insufficient documentation

## 2015-07-11 DIAGNOSIS — N938 Other specified abnormal uterine and vaginal bleeding: Secondary | ICD-10-CM | POA: Insufficient documentation

## 2015-07-11 DIAGNOSIS — D279 Benign neoplasm of unspecified ovary: Secondary | ICD-10-CM | POA: Diagnosis not present

## 2015-07-11 DIAGNOSIS — I10 Essential (primary) hypertension: Secondary | ICD-10-CM | POA: Insufficient documentation

## 2015-07-11 DIAGNOSIS — N939 Abnormal uterine and vaginal bleeding, unspecified: Secondary | ICD-10-CM | POA: Diagnosis present

## 2015-07-11 HISTORY — DX: Benign neoplasm of unspecified ovary: D27.9

## 2015-07-11 LAB — WET PREP, GENITAL
Clue Cells Wet Prep HPF POC: NONE SEEN
SPERM: NONE SEEN
Trich, Wet Prep: NONE SEEN
YEAST WET PREP: NONE SEEN

## 2015-07-11 LAB — CBC
HEMATOCRIT: 33.1 % — AB (ref 36.0–46.0)
HEMOGLOBIN: 10.5 g/dL — AB (ref 12.0–15.0)
MCH: 25.7 pg — ABNORMAL LOW (ref 26.0–34.0)
MCHC: 31.7 g/dL (ref 30.0–36.0)
MCV: 81.1 fL (ref 78.0–100.0)
Platelets: 401 10*3/uL — ABNORMAL HIGH (ref 150–400)
RBC: 4.08 MIL/uL (ref 3.87–5.11)
RDW: 15.9 % — ABNORMAL HIGH (ref 11.5–15.5)
WBC: 9.5 10*3/uL (ref 4.0–10.5)

## 2015-07-11 LAB — URINALYSIS, ROUTINE W REFLEX MICROSCOPIC
Bilirubin Urine: NEGATIVE
GLUCOSE, UA: NEGATIVE mg/dL
Ketones, ur: NEGATIVE mg/dL
Nitrite: NEGATIVE
Protein, ur: 30 mg/dL — AB
pH: 5.5 (ref 5.0–8.0)

## 2015-07-11 LAB — URINE MICROSCOPIC-ADD ON: Bacteria, UA: NONE SEEN

## 2015-07-11 LAB — POCT PREGNANCY, URINE: Preg Test, Ur: NEGATIVE

## 2015-07-11 LAB — TSH: TSH: 0.439 u[IU]/mL (ref 0.350–4.500)

## 2015-07-11 MED ORDER — MEGESTROL ACETATE 20 MG PO TABS: 40.0000 mg | ORAL_TABLET | Freq: Every day | ORAL | Status: DC

## 2015-07-11 MED ORDER — FERROUS SULFATE 325 (65 FE) MG PO TABS: 325.0000 mg | ORAL_TABLET | Freq: Two times a day (BID) | ORAL | Status: DC

## 2015-07-11 NOTE — Discharge Instructions (Signed)
Abnormal Uterine Bleeding °Abnormal uterine bleeding means bleeding from the vagina that is not your normal menstrual period. This can be: °· Bleeding or spotting between periods. °· Bleeding after sex (sexual intercourse). °· Bleeding that is heavier or more than normal. °· Periods that last longer than usual. °· Bleeding after menopause. °There are many problems that may cause this. Treatment will depend on the cause of the bleeding. Any kind of bleeding that is not normal should be reviewed by your doctor.  °HOME CARE °Watch your condition for any changes. These actions may lessen any discomfort you are having: °· Do not use tampons or douches as told by your doctor. °· Change your pads often. °You should get regular pelvic exams and Pap tests. Keep all appointments for tests as told by your doctor. °GET HELP IF: °· You are bleeding for more than 1 week. °· You feel dizzy at times. °GET HELP RIGHT AWAY IF:  °· You pass out. °· You have to change pads every 15 to 30 minutes. °· You have belly pain. °· You have a fever. °· You become sweaty or weak. °· You are passing large blood clots from the vagina. °· You feel sick to your stomach (nauseous) and throw up (vomit). °MAKE SURE YOU: °· Understand these instructions. °· Will watch your condition. °· Will get help right away if you are not doing well or get worse. °  °This information is not intended to replace advice given to you by your health care provider. Make sure you discuss any questions you have with your health care provider. °  °Document Released: 01/24/2009 Document Revised: 04/03/2013 Document Reviewed: 10/26/2012 °Elsevier Interactive Patient Education ©2016 Elsevier Inc. ° °

## 2015-07-11 NOTE — Telephone Encounter (Signed)
Agree. Thanks

## 2015-07-11 NOTE — MAU Note (Signed)
Patient presents stating that she is not pregnant with c/o bleeding X 17 days. Denies pain or discharge.

## 2015-07-11 NOTE — Telephone Encounter (Signed)
Pt called complaining of vaginal bleeding x17 days.  States 2-3 days were like normal menstrual cycle but for the remaining days she is changing a pad q3 hours or using 2 pads.  Denies dizziness, lightheadedness, or SOB.  Does endorse headache over last few days.  She does not have an Ob-gyn.  I have directed her to the [facility] Admissions unit for evaluation.  FYI

## 2015-07-11 NOTE — MAU Provider Note (Signed)
Chief Complaint:  Vaginal Bleeding   First Provider Initiated Contact with Patient 07/11/15 [place] is a 50 y.o. G3P3 who presents to maternity admissions reporting heavy vaginal bleeding for 17 days.  Denies pain. She reports vaginal bleeding, but denies vaginal itching/burning, urinary symptoms, h/a, dizziness, n/v, or fever/chills.    States has been regular for years, never irregular.  No signs of menopause.  This is the first time she has done this.   History is remarkable for 3 vaginal deliveries, and removal of a large serous adenoma (15cm) in 2011   Vaginal Bleeding The patient's primary symptoms include vaginal bleeding. The patient's pertinent negatives include no genital itching, genital lesions, genital odor, missed menses, pelvic pain or vaginal discharge. This is a new problem. The current episode started 1 to 4 weeks ago. The problem occurs constantly. The problem has been unchanged. The patient is experiencing no pain. The problem affects both sides. She is not pregnant. Pertinent negatives include no abdominal pain, back pain, chills, constipation, diarrhea, dysuria, fever, nausea or vomiting. The vaginal discharge was bloody. The vaginal bleeding is heavier than menses. She has been passing clots. She has not been passing tissue. Nothing aggravates the symptoms. She has tried NSAIDs for the symptoms. The treatment provided no relief. It is unknown whether or not her partner has an STD. She uses tubal ligation for contraception.     RN Note:  Expand All Collapse All   Patient presents stating that she is not pregnant with c/o bleeding X 17 days. Denies pain or discharge.          Past Medical History: Past Medical History  Diagnosis Date  . Asthma   . Adnexal cyst 2011    removed  . Hip pain, right   . Complication of anesthesia     difficult to awake  . Hypertension   . Family history of adverse reaction to anesthesia     "1 sister and both  parents hard to wake up"  . Heart murmur     "dx'd when I was a teenager; haven't had any problems w/it"  . Anemia   . Daily headache 12/2013    "tx'd and no problems anymore" (08/29/2014)  . Arthritis     "neck; spine" (08/29/2014)  . Chronic lower back pain     Past obstetric history: OB History  No data available    Past Surgical History: Past Surgical History  Procedure Laterality Date  . Transforaminal lumbar interbody fusion (tlif) with pedicle screw fixation 2 level  08/29/2014    L4-S1  . Back surgery    . Reduction mammaplasty Bilateral 1995  . Ovarian cyst removal Right 2011  . Tubal ligation  1992  . Transforaminal lumbar interbody fusion (tlif) with pedicle screw fixation 2 level N/A 08/29/2014    Procedure: TRANSFORAMINAL LUMBAR INTERBODY FUSION (TLIF) L4-S1;  Surgeon: Melina Schools, MD;  Location: [facility];  Service: Orthopedics;  Laterality: N/A;    Family History: No family history on file.  Social History: Social History  Substance Use Topics  . Smoking status: Never Smoker   . Smokeless tobacco: Never Used  . Alcohol Use: No    Allergies:  Allergies  Allergen Reactions  . 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 Primatene Mist [Epinephrine] Other (See Comments)    Blacked out  . Shrimp [Shellfish Allergy] Anaphylaxis    Causes vision loss.  Tori Milks [Naproxen Sodium] Other (See  Comments)    Chest pains  . Midol [Ibuprofen] Other (See Comments)    Caused aggitation.  Marland Kitchen Avocado Rash  . Bactrim Ds [Sulfamethoxazole W/Trimethoprim (Co-Trimoxazole)] Other (See Comments)    Seeing people while taking med.  . Cocoa Butter Itching  . Latex Itching    Meds:  Prescriptions prior to admission  Medication Sig Dispense Refill Last Dose  . Ascorbic Acid (VITAMIN C PO) Take 1 tablet by mouth daily.   Past Week at Unknown time  . ferrous sulfate 325 (65 FE) MG tablet Take 325 mg by mouth every morning.    Past Week at Unknown time  . ibuprofen (ADVIL,MOTRIN)  600 MG tablet Take 600 mg by mouth 2 (two) times daily.   07/11/2015 at Unknown time  . magnesium oxide (MAG-OX) 400 MG tablet Take 400 mg by mouth every morning.    Past Week at Unknown time  . metoprolol tartrate (LOPRESSOR) 25 MG tablet Take 25 mg by mouth daily.    07/11/2015 at 1600  . POTASSIUM PO Take 1 tablet by mouth every morning. Over the counter product (does not know strength   Past Week at Unknown time  . traMADol (ULTRAM) 50 MG tablet Take 50 mg by mouth 2 (two) times daily.   07/11/2015 at Unknown time  . albuterol (PROVENTIL HFA;VENTOLIN HFA) 108 (90 Base) MCG/ACT inhaler Inhale 2 puffs into the lungs every 6 (six) hours as needed for wheezing or shortness of breath. 1 Inhaler 3   . oxyCODONE-acetaminophen (PERCOCET) 5-325 MG tablet Take 1 tablet by mouth every 4 (four) hours as needed. (Patient not taking: Reported on 07/11/2015) 20 tablet 0 Not Taking at Unknown time    I have reviewed patient's Past Medical Hx, Surgical Hx, Family Hx, Social Hx, medications and allergies.  ROS:  Review of Systems  Constitutional: Negative for fever, chills and fatigue.  Gastrointestinal: Negative for nausea, vomiting, abdominal pain, diarrhea and constipation.  Genitourinary: Positive for vaginal bleeding and menstrual problem. Negative for dysuria, vaginal discharge, difficulty urinating, pelvic pain and missed menses.  Musculoskeletal: Negative for back pain.  Neurological: Negative for dizziness.   Other systems negative     Physical Exam   Filed Vitals:   07/11/15 1738 07/11/15 1810  BP: 143/97 159/90  Pulse: 90 85  Temp: 97.8 F (36.6 C)   Resp: 18 18    Constitutional: Well-developed, well-nourished female in no acute distress.  Cardiovascular: normal rate and rhythm, no ectopy audible, S1 & S2 heard, no murmur Respiratory: normal effort, no distress. Lungs CTAB with no wheezes or crackles GI: Abd soft, non-tender.  Nondistended.  No rebound, No guarding.  Bowel Sounds  audible  MS: Extremities nontender, no edema, normal ROM except for right hip which needs replacement. Neurologic: Alert and oriented x 4.   Grossly nonfocal. GU: Neg CVAT. Skin:  Warm and Dry Psych:  Affect appropriate.  PELVIC EXAM: Cervix pink, visually closed, without lesion, moderate dark red discharge, vaginal walls and external genitalia normal Bimanual exam: Cervix firm, anterior, neg CMT, uterus nontender, nonenlarged, adnexa without tenderness, enlargement, or mass Difficult to palpate uterus due to habitus.   Labs: Results for orders placed or performed during the hospital encounter of 07/11/15 (from the past 24 hour(s))  Urinalysis, Routine w reflex microscopic (not at [facility])     Status: Abnormal   Collection Time: 07/11/15  5:15 PM  Result Value Ref Range   Color, Urine RED (A) YELLOW   APPearance CLOUDY (A) CLEAR  Specific Gravity, Urine >1.030 (H) 1.005 - 1.030   pH 5.5 5.0 - 8.0   Glucose, UA NEGATIVE NEGATIVE mg/dL   Hgb urine dipstick LARGE (A) NEGATIVE   Bilirubin Urine NEGATIVE NEGATIVE   Ketones, ur NEGATIVE NEGATIVE mg/dL   Protein, ur 30 (A) NEGATIVE mg/dL   Nitrite NEGATIVE NEGATIVE   Leukocytes, UA TRACE (A) NEGATIVE  Urine microscopic-add on     Status: Abnormal   Collection Time: 07/11/15  5:15 PM  Result Value Ref Range   Squamous Epithelial / LPF 0-5 (A) NONE SEEN   WBC, UA 0-5 0 - 5 WBC/hpf   RBC / HPF TOO NUMEROUS TO COUNT 0 - 5 RBC/hpf   Bacteria, UA NONE SEEN NONE SEEN  Pregnancy, urine POC     Status: None   Collection Time: 07/11/15  5:30 PM  Result Value Ref Range   Preg Test, Ur NEGATIVE NEGATIVE  CBC     Status: Abnormal   Collection Time: 07/11/15  5:44 PM  Result Value Ref Range   WBC 9.5 4.0 - 10.5 K/uL   RBC 4.08 3.87 - 5.11 MIL/uL   Hemoglobin 10.5 (L) 12.0 - 15.0 g/dL   HCT 33.1 (L) 36.0 - 46.0 %   MCV 81.1 78.0 - 100.0 fL   MCH 25.7 (L) 26.0 - 34.0 pg   MCHC 31.7 30.0 - 36.0 g/dL   RDW 15.9 (H) 11.5 - 15.5 %   Platelets 401  (H) 150 - 400 K/uL  Wet prep, genital     Status: Abnormal   Collection Time: 07/11/15  6:05 PM  Result Value Ref Range   Yeast Wet Prep HPF POC NONE SEEN NONE SEEN   Trich, Wet Prep NONE SEEN NONE SEEN   Clue Cells Wet Prep HPF POC NONE SEEN NONE SEEN   WBC, Wet Prep HPF POC MODERATE (A) NONE SEEN   Sperm NONE SEEN     Imaging:  No results found.  MAU Course/MDM: I have ordered labs as follows:  CBC, TSH , cultures Imaging ordered: Out patient [place] Results reviewed  Pt stable at time of discharge.  Assessment: Dysfunctional Uterine Bleeding, perimenopausal Mild anemia  Plan: Discharge home Outpatient [place] ordered to evaluate stripe and ovaries Will refer to clinic for endometrial biopsy.  Would love to see Dr Hulan Fray (did her surgery) Rx sent for Megace  for menorrhagia.   (40 tid x 2d, 40 bid x 2d, then 40 qd)     Medication List    ASK your doctor about these medications        albuterol 108 (90 Base) MCG/ACT inhaler  Commonly known as:  PROVENTIL HFA;VENTOLIN HFA  Inhale 2 puffs into the lungs every 6 (six) hours as needed for wheezing or shortness of breath.     ferrous sulfate 325 (65 FE) MG tablet  Take 325 mg by mouth every morning.     ibuprofen 600 MG tablet  Commonly known as:  ADVIL,MOTRIN  Take 600 mg by mouth 2 (two) times daily.     magnesium oxide 400 MG tablet  Commonly known as:  MAG-OX  Take 400 mg by mouth every morning.     metoprolol tartrate 25 MG tablet  Commonly known as:  LOPRESSOR  Take 25 mg by mouth daily.     oxyCODONE-acetaminophen 5-325 MG tablet  Commonly known as:  PERCOCET  Take 1 tablet by mouth every 4 (four) hours as needed.     POTASSIUM PO  Take 1 tablet by  mouth every morning. Over the counter product (does not know strength     traMADol 50 MG tablet  Commonly known as:  ULTRAM  Take 50 mg by mouth 2 (two) times daily.     VITAMIN C PO  Take 1 tablet by mouth daily.       Encouraged to return here or to other  Urgent Care/ED if she develops worsening of symptoms, increase in pain, fever, or other concerning symptoms.   Hansel Feinstein CNM, MSN Certified Nurse-Midwife 07/11/2015 5:37 PM

## 2015-07-14 LAB — GC/CHLAMYDIA PROBE AMP (~~LOC~~) NOT AT ARMC
Chlamydia: NEGATIVE
NEISSERIA GONORRHEA: NEGATIVE

## 2015-07-15 ENCOUNTER — Other Ambulatory Visit: Payer: Self-pay | Admitting: Advanced Practice Midwife

## 2015-07-15 DIAGNOSIS — N938 Other specified abnormal uterine and vaginal bleeding: Secondary | ICD-10-CM

## 2015-07-17 ENCOUNTER — Telehealth: Payer: Self-pay | Admitting: Internal Medicine

## 2015-07-17 NOTE — Telephone Encounter (Signed)
APPT. REMINDER CALL, LMTCB °

## 2015-07-18 ENCOUNTER — Encounter: Payer: Self-pay | Admitting: Internal Medicine

## 2015-07-18 ENCOUNTER — Ambulatory Visit (INDEPENDENT_AMBULATORY_CARE_PROVIDER_SITE_OTHER): Payer: Medicaid Other | Admitting: Internal Medicine

## 2015-07-18 VITALS — BP 161/97 | HR 92 | Temp 98.3°F | Ht 62.0 in | Wt 212.7 lb

## 2015-07-18 DIAGNOSIS — M150 Primary generalized (osteo)arthritis: Secondary | ICD-10-CM | POA: Diagnosis not present

## 2015-07-18 DIAGNOSIS — R300 Dysuria: Secondary | ICD-10-CM

## 2015-07-18 DIAGNOSIS — I10 Essential (primary) hypertension: Secondary | ICD-10-CM | POA: Diagnosis present

## 2015-07-18 DIAGNOSIS — M159 Polyosteoarthritis, unspecified: Secondary | ICD-10-CM

## 2015-07-18 LAB — POCT URINALYSIS DIPSTICK
Bilirubin, UA: NEGATIVE
Glucose, UA: NEGATIVE
Ketones, UA: NEGATIVE
Nitrite, UA: POSITIVE
Spec Grav, UA: 1.025
Urobilinogen, UA: 0.2
pH, UA: 5.5

## 2015-07-18 MED ORDER — METOPROLOL TARTRATE 25 MG PO TABS: 25.0000 mg | ORAL_TABLET | Freq: Every day | ORAL | Status: DC

## 2015-07-18 MED ORDER — TRAMADOL HCL 50 MG PO TABS: 50.0000 mg | ORAL_TABLET | Freq: Every day | ORAL | Status: DC | PRN

## 2015-07-18 MED ORDER — IBUPROFEN 600 MG PO TABS: 600.0000 mg | ORAL_TABLET | Freq: Every day | ORAL | Status: DC | PRN

## 2015-07-18 MED ORDER — CEPHALEXIN 500 MG PO CAPS: 500.0000 mg | ORAL_CAPSULE | Freq: Two times a day (BID) | ORAL | Status: DC

## 2015-07-18 NOTE — Patient Instructions (Addendum)
1. Take your Metoprolol 25 mg TWICE DAILY every day for blood pressure control. 2. Take Cephalexin 500 mg (1 tab) twice daily for 1 week. 3. Return to clinic in 3 months to recheck BP.  Urinary Tract Infection Urinary tract infections (UTIs) can develop anywhere along your urinary tract. Your urinary tract is your body's drainage system for removing wastes and extra water. Your urinary tract includes two kidneys, two ureters, a bladder, and a urethra. Your kidneys are a pair of bean-shaped organs. Each kidney is about the size of your fist. They are located below your ribs, one on each side of your spine. CAUSES Infections are caused by microbes, which are microscopic organisms, including fungi, viruses, and bacteria. These organisms are so small that they can only be seen through a microscope. Bacteria are the microbes that most commonly cause UTIs. SYMPTOMS  Symptoms of UTIs may vary by age and gender of the patient and by the location of the infection. Symptoms in young women typically include a frequent and intense urge to urinate and a painful, burning feeling in the bladder or urethra during urination. Older women and men are more likely to be tired, shaky, and weak and have muscle aches and abdominal pain. A fever may mean the infection is in your kidneys. Other symptoms of a kidney infection include pain in your back or sides below the ribs, nausea, and vomiting. DIAGNOSIS To diagnose a UTI, your caregiver will ask you about your symptoms. Your caregiver will also ask you to provide a urine sample. The urine sample will be tested for bacteria and white blood cells. White blood cells are made by your body to help fight infection. TREATMENT  Typically, UTIs can be treated with medication. Because most UTIs are caused by a bacterial infection, they usually can be treated with the use of antibiotics. The choice of antibiotic and length of treatment depend on your symptoms and the type of bacteria  causing your infection. HOME CARE INSTRUCTIONS  If you were prescribed antibiotics, take them exactly as your caregiver instructs you. Finish the medication even if you feel better after you have only taken some of the medication.  Drink enough water and fluids to keep your urine clear or pale yellow.  Avoid caffeine, tea, and carbonated beverages. They tend to irritate your bladder.  Empty your bladder often. Avoid holding urine for long periods of time.  Empty your bladder before and after sexual intercourse.  After a bowel movement, women should cleanse from front to back. Use each tissue only once. SEEK MEDICAL CARE IF:   You have back pain.  You develop a fever.  Your symptoms do not begin to resolve within 3 days. SEEK IMMEDIATE MEDICAL CARE IF:   You have severe back pain or lower abdominal pain.  You develop chills.  You have nausea or vomiting.  You have continued burning or discomfort with urination. MAKE SURE YOU:   Understand these instructions.  Will watch your condition.  Will get help right away if you are not doing well or get worse.   This information is not intended to replace advice given to you by your health care provider. Make sure you discuss any questions you have with your health care provider.   Document Released: 01/06/2005 Document Revised: 12/18/2014 Document Reviewed: 05/07/2011 Elsevier Interactive Patient Education [employer].

## 2015-07-18 NOTE — Assessment & Plan Note (Addendum)
BP Readings from Last 3 Encounters:  07/18/15 161/97  07/11/15 159/90  04/17/15 158/99    Lab Results  Component Value Date   NA 139 08/28/2014   K 3.8 08/28/2014   CREATININE 0.74 08/28/2014    Assessment: Blood pressure control:  poor Progress toward BP goal:    Comments: Patient has not taken her Metoprolol 25 mg BID in 2 days, and was previously only taking it once daily  Plan: Medications:  continue current medications, Metoprolol 25 mg BID Educational resources provided:   Self management tools provided:   Other plans: RTC 3 months.  If no improvement, switch to HCTZ for better compliance

## 2015-07-18 NOTE — Assessment & Plan Note (Addendum)
Patient reports continued back pain managed with Ibuprofen 600 mg once daily and Tramadol 50 mg once daily.  She has also seen an orthopedic surgeon about a hip replacement, but she has no insurance.   A/P: OA of multiple joints.  Patient needs [facility] Card evaluation for coverage of hip replacement. - Refilled Ibuprofen - Refilled Tramadol - Set up appt with [facility]

## 2015-07-18 NOTE — Progress Notes (Signed)
Patient ID: Brandi Chase, female   DOB: 06-14-1965, 50 y.o.   MRN: [id number]   Subjective:   Patient ID: Brandi Chase female   DOB: 05-05-1965 50 y.o.   MRN: [id number]  HPI: [name] is a 50 y.o. female with PMH as below, here for BP f/u and dysuria.  Please see Problem-Based charting for the status of the patient's chronic medical issues.     Past Medical History  Diagnosis Date  . Asthma   . Adnexal cyst 2011    removed  . Hip pain, right   . Complication of anesthesia     difficult to awake  . Hypertension   . Family history of adverse reaction to anesthesia     "1 sister and both parents hard to wake up"  . Heart murmur     "dx'd when I was a teenager; haven't had any problems w/it"  . Anemia   . Daily headache 12/2013    "tx'd and no problems anymore" (08/29/2014)  . Arthritis     "neck; spine" (08/29/2014)  . Chronic lower back pain    Current Outpatient Prescriptions  Medication Sig Dispense Refill  . albuterol (PROVENTIL HFA;VENTOLIN HFA) 108 (90 Base) MCG/ACT inhaler Inhale 2 puffs into the lungs every 6 (six) hours as needed for wheezing or shortness of breath. 1 Inhaler 3  . Ascorbic Acid (VITAMIN C PO) Take 1 tablet by mouth daily.    . ferrous sulfate 325 (65 FE) MG tablet Take 325 mg by mouth every morning.     Marland Kitchen ibuprofen (ADVIL,MOTRIN) 600 MG tablet Take 600 mg by mouth 2 (two) times daily.    . magnesium oxide (MAG-OX) 400 MG tablet Take 400 mg by mouth every morning.     . megestrol (MEGACE) 20 MG tablet Take 2 tablets (40 mg total) by mouth daily. Start with 2 tabs every 8 hrs x 2 days, then 2 tabs every 12 hrs, then two tabs daily 60 tablet 2  . metoprolol tartrate (LOPRESSOR) 25 MG tablet Take 25 mg by mouth daily.     Marland Kitchen POTASSIUM PO Take 1 tablet by mouth every morning. Over the counter product (does not know strength    . traMADol (ULTRAM) 50 MG tablet Take 50 mg by mouth 2 (two) times daily.    . [DISCONTINUED] Potassium 95 MG  TABS Take 1 tablet by mouth daily.    . [DISCONTINUED] POTASSIUM CHLORIDE PO Take 1 tablet by mouth daily. OTC     No current facility-administered medications for this visit.   No family history on file. Social History   Social History  . Marital Status: Single    Spouse Name: N/A  . Number of Children: N/A  . Years of Education: N/A   Social History Main Topics  . Smoking status: Never Smoker   . Smokeless tobacco: Never Used  . Alcohol Use: No  . Drug Use: No  . Sexual Activity: Yes    Birth Control/ Protection: Surgical   Other Topics Concern  . None   Social History Narrative   Review of Systems: Denies fever, chills, HA, CP, SOB, dizziness, hematuria, or flank pain.  Endorses dysuria and blurry vision for 2 weeks. Objective:  Physical Exam: Filed Vitals:   07/18/15 1508  BP: 161/97  Pulse: 92  Temp: 98.3 F (36.8 C)  TempSrc: Oral  Height: 5\' 2"  (1.575 m)  Weight: 212 lb 11.2 oz (96.48 kg)  SpO2: 100%   Physical Exam  Constitutional: She is oriented to person, place, and time.  Pleasant, obese female, sitting in chair, NAD  HENT:  Head: Normocephalic and atraumatic.  Eyes: EOM are normal. No scleral icterus.  Neck: No tracheal deviation present.  Cardiovascular: Normal rate, regular rhythm and intact distal pulses.   Grade II/VI systolic crescendo-decrescendo murmur heard best at RUSB without radiation.  Pulmonary/Chest: Effort normal and breath sounds normal. No stridor. No respiratory distress. She has no wheezes.  Musculoskeletal:  Trace edema to midshin.  Neurological: She is alert and oriented to person, place, and time.  Skin: Skin is warm and dry.     Assessment & Plan:   Patient and case were discussed with Dr. Eppie Gibson.  Please refer to Problem Based charting for further documentation.

## 2015-07-18 NOTE — Assessment & Plan Note (Addendum)
Patient complaining of dysuria and malodorous urine for the last 2 weeks, which started while having DUB (seen at [facility]).  She denies fever, chills, flank pain, or blood in the urine.  U/A positive for nitrites and few leukocytes.  Will treat with Keflex for 1 week and f/u culture. - Cephalexin 500 mg BID x7 days. - Urine culture

## 2015-07-20 LAB — URINE CULTURE

## 2015-07-22 ENCOUNTER — Ambulatory Visit (HOSPITAL_COMMUNITY)
Admission: RE | Admit: 2015-07-22 | Discharge: 2015-07-22 | Disposition: A | Discharge status: Home / Self Care | Payer: Medicaid Other | Source: Ambulatory Visit | Attending: Advanced Practice Midwife | Admitting: Advanced Practice Midwife

## 2015-07-22 DIAGNOSIS — N8000 Endometriosis of the uterus, unspecified: Secondary | ICD-10-CM

## 2015-07-22 DIAGNOSIS — N83202 Unspecified ovarian cyst, left side: Secondary | ICD-10-CM | POA: Diagnosis not present

## 2015-07-22 DIAGNOSIS — N938 Other specified abnormal uterine and vaginal bleeding: Secondary | ICD-10-CM | POA: Diagnosis not present

## 2015-07-22 DIAGNOSIS — N83201 Unspecified ovarian cyst, right side: Secondary | ICD-10-CM | POA: Diagnosis not present

## 2015-07-22 NOTE — Progress Notes (Signed)
Case discussed with Dr. Taylor at the time of the visit. We reviewed the resident's history and exam and pertinent patient test results. I agree with the assessment, diagnosis, and plan of care documented in the resident's note. 

## 2015-07-22 NOTE — Addendum Note (Signed)
Addended by: Oval Linsey D on: 07/22/2015 08:40 AM   Modules accepted: Level of Service

## 2015-07-25 DIAGNOSIS — N8000 Endometriosis of the uterus, unspecified: Secondary | ICD-10-CM | POA: Insufficient documentation

## 2015-07-29 ENCOUNTER — Telehealth: Payer: Self-pay | Admitting: Internal Medicine

## 2015-07-29 NOTE — Telephone Encounter (Signed)
APPT. REMINDER CALL, LMTCB °

## 2015-07-30 ENCOUNTER — Ambulatory Visit: Payer: Self-pay

## 2015-08-08 ENCOUNTER — Other Ambulatory Visit (HOSPITAL_COMMUNITY)
Admission: RE | Admit: 2015-08-08 | Discharge: 2015-08-08 | Disposition: A | Discharge status: Home / Self Care | Payer: Medicaid Other | Source: Ambulatory Visit | Attending: Obstetrics & Gynecology | Admitting: Obstetrics & Gynecology

## 2015-08-08 ENCOUNTER — Encounter: Payer: Self-pay | Admitting: Obstetrics & Gynecology

## 2015-08-08 ENCOUNTER — Ambulatory Visit (INDEPENDENT_AMBULATORY_CARE_PROVIDER_SITE_OTHER): Payer: Self-pay | Admitting: Obstetrics & Gynecology

## 2015-08-08 VITALS — BP 138/90 | HR 78 | Ht 62.0 in | Wt 209.1 lb

## 2015-08-08 DIAGNOSIS — N938 Other specified abnormal uterine and vaginal bleeding: Secondary | ICD-10-CM | POA: Insufficient documentation

## 2015-08-08 NOTE — Progress Notes (Signed)
   Subjective:    Patient ID: Margo Common, female    DOB: 26-Aug-1965, 50 y.o.   MRN: [id number]  HPI 50 yo morbidly obese AA lady is here for follow up after a MAU visit for DUB. Her hbg was 10.5 recently.   Review of Systems She has had a BTL.    Objective:   Physical Exam Obese pleasant AA woman, breathing and conversing normally Ambulating with a cane We had nurses holding her legs as she could not use the stirrups. UPT negative, consent signed, time out done Cervix prepped with betadine and grasped with a single tooth tenaculum Uterus sounded to 9 cm Pipelle used for 2 passes with a moderate amount of tissue obtained. She tolerated the procedure well.         Assessment & Plan:  DUB- await EMBX results RTC 2 weeks for results and treatment plan

## 2015-08-15 ENCOUNTER — Ambulatory Visit (INDEPENDENT_AMBULATORY_CARE_PROVIDER_SITE_OTHER): Payer: Self-pay | Admitting: Obstetrics & Gynecology

## 2015-08-15 VITALS — BP 163/84 | HR 79 | Wt 210.1 lb

## 2015-08-15 DIAGNOSIS — D500 Iron deficiency anemia secondary to blood loss (chronic): Secondary | ICD-10-CM

## 2015-08-15 DIAGNOSIS — N938 Other specified abnormal uterine and vaginal bleeding: Secondary | ICD-10-CM

## 2015-08-15 NOTE — Progress Notes (Signed)
   Subjective:    Patient ID: Brandi Chase, female    DOB: November 29, 1965, 50 y.o.   MRN: [id number]  HPI  50 yo AA P3 lady here for follow up after her u/s, TSH, and EMBX for evaluation of anemia/DUB. Her bleeding finally stopped so she never started the megace.  Review of Systems Mother went through menopause in her 40s but her 3 other sisters are in their 25s and still have periods.   She has had a BTL.  Objective:   Physical Exam        Assessment & Plan:  DUB/anemia- Treatment options include: megace prn, Mirena IUD, endometrial ablation She plans to use the megace on a prn basis She will come back prn Preventative - rec free pap clinic

## 2015-08-20 ENCOUNTER — Other Ambulatory Visit (HOSPITAL_COMMUNITY): Payer: Self-pay | Admitting: Obstetrics & Gynecology

## 2015-08-20 DIAGNOSIS — Z1231 Encounter for screening mammogram for malignant neoplasm of breast: Secondary | ICD-10-CM

## 2015-08-29 ENCOUNTER — Ambulatory Visit
Admission: RE | Admit: 2015-08-29 | Discharge: 2015-08-29 | Disposition: A | Discharge status: Home / Self Care | Payer: No Typology Code available for payment source | Source: Ambulatory Visit | Attending: Obstetrics & Gynecology | Admitting: Obstetrics & Gynecology

## 2015-08-29 DIAGNOSIS — Z1231 Encounter for screening mammogram for malignant neoplasm of breast: Secondary | ICD-10-CM

## 2015-09-11 ENCOUNTER — Ambulatory Visit (INDEPENDENT_AMBULATORY_CARE_PROVIDER_SITE_OTHER): Payer: Medicaid Other | Admitting: Internal Medicine

## 2015-09-11 ENCOUNTER — Encounter: Payer: Self-pay | Admitting: Internal Medicine

## 2015-09-11 VITALS — BP 136/74 | HR 83 | Temp 98.0°F | Ht 61.0 in | Wt 212.5 lb

## 2015-09-11 DIAGNOSIS — M150 Primary generalized (osteo)arthritis: Secondary | ICD-10-CM

## 2015-09-11 DIAGNOSIS — M159 Polyosteoarthritis, unspecified: Secondary | ICD-10-CM

## 2015-09-11 MED ORDER — KETOROLAC TROMETHAMINE 60 MG/2ML IM SOLN
30.0000 mg | Freq: Once | INTRAMUSCULAR | Status: AC
  Administered 2015-09-11: 30 mg via INTRAMUSCULAR

## 2015-09-11 MED ORDER — IBUPROFEN 600 MG PO TABS: 600.0000 mg | ORAL_TABLET | Freq: Every day | ORAL | Status: DC | PRN

## 2015-09-11 NOTE — Patient Instructions (Addendum)
1. I will place a referral for you to see a new orthopedic surgeon.  You should have refills of Tramadol at your pharmacy.  I will refill ibuprofen.  Please take this medication with food to prevent stomach upset.  I will call you if there are problems with your blood work.     2. Please take all medications as prescribed.    3. If you have worsening of your symptoms or new symptoms arise, please call the clinic [phone]), or go to the ER immediately if symptoms are severe.   Please return for follow-up in the next 1-2 months.

## 2015-09-11 NOTE — Assessment & Plan Note (Addendum)
Assessment:  No change in chronic back and hip pain.  One Tramadol and one ibuprofen daily work for her pain.  On bad days she may need two of each (2-3 bad days per month).  Patient with known OA and reports she was told she needed hip surgery last fall but lost her insurance so she was not able to get the procedure. She just acquired Medicaid and would like referral to Orthopedic.   She has back surgery last year but is requesting referral to a different office for the hip.  She also requests Toradol injection.  She says this works well for her and she has not had one in months. Plan:  Refer to orthopedic surgeon.  Continue Tramadol and ibuprofen as prescribed by PCP.  She should have Tramadol refills (would consider pain contract if this will be a long term med after her surgery).  I will refill one month of ibuprofen, check BMP today.  She is taking ibuprofen with food.  She understands this is not a long-term med.  IM Toradol injection given in clinic.

## 2015-09-11 NOTE — Progress Notes (Signed)
Subjective:    Patient ID: Brandi Chase, female    DOB: 1965-09-16, 50 y.o.   MRN: [id number]  HPI Comments: Brandi Chase is a 50 year old woman with PMH of OA who recently acquired Medicaid and is here requesting referral to new orthopedic surgeon for hip surgery evaluation.        Past Medical History  Diagnosis Date  . Asthma   . Adnexal cyst 2011    removed  . Hip pain, right   . Complication of anesthesia     difficult to awake  . Hypertension   . Family history of adverse reaction to anesthesia     "1 sister and both parents hard to wake up"  . Heart murmur     "dx'd when I was a teenager; haven't had any problems w/it"  . Anemia   . Daily headache 12/2013    "tx'd and no problems anymore" (08/29/2014)  . Arthritis     "neck; spine" (08/29/2014)  . Chronic lower back pain    Current Outpatient Prescriptions on File Prior to Visit  Medication Sig Dispense Refill  . albuterol (PROVENTIL HFA;VENTOLIN HFA) 108 (90 Base) MCG/ACT inhaler Inhale 2 puffs into the lungs every 6 (six) hours as needed for wheezing or shortness of breath. 1 Inhaler 3  . Ascorbic Acid (VITAMIN C PO) Take 1 tablet by mouth daily.    . cephALEXin (KEFLEX) 500 MG capsule Take 1 capsule (500 mg total) by mouth 2 (two) times daily. 14 capsule 0  . ferrous sulfate 325 (65 FE) MG tablet Take 325 mg by mouth every morning.     Marland Kitchen ibuprofen (ADVIL,MOTRIN) 600 MG tablet Take 1 tablet (600 mg total) by mouth daily as needed. 30 tablet 1  . magnesium oxide (MAG-OX) 400 MG tablet Take 400 mg by mouth every morning.     . megestrol (MEGACE) 20 MG tablet Take 2 tablets (40 mg total) by mouth daily. Start with 2 tabs every 8 hrs x 2 days, then 2 tabs every 12 hrs, then two tabs daily 60 tablet 2  . metoprolol tartrate (LOPRESSOR) 25 MG tablet Take 1 tablet (25 mg total) by mouth daily. 60 tablet 3  . POTASSIUM PO Take 1 tablet by mouth every morning. Over the counter product (does not know strength    .  traMADol (ULTRAM) 50 MG tablet Take 1 tablet (50 mg total) by mouth daily as needed. 30 tablet 3  . [DISCONTINUED] Potassium 95 MG TABS Take 1 tablet by mouth daily.    . [DISCONTINUED] POTASSIUM CHLORIDE PO Take 1 tablet by mouth daily. OTC     No current facility-administered medications on file prior to visit.    Review of Systems  Constitutional: Negative for fever, chills and appetite change.  Gastrointestinal: Negative for nausea, vomiting, diarrhea and blood in stool.  Genitourinary: Negative for dysuria, hematuria and difficulty urinating.  Musculoskeletal: Positive for back pain and arthralgias.  Neurological: Negative for syncope and light-headedness.       Filed Vitals:   09/11/15 1355 09/11/15 1402  BP: 136/74   Pulse: 83   Temp: 98 F (36.7 C)   Height:  5\' 1"  (1.549 m)  Weight: 212 lb 8 oz (96.389 kg)   SpO2: 98%      Objective:   Physical Exam  Constitutional: She is oriented to person, place, and time. She appears well-developed. No distress.  HENT:  Head: Normocephalic and atraumatic.  Mouth/Throat: Oropharynx is clear and moist.  No oropharyngeal exudate.  Eyes: Conjunctivae and EOM are normal. Pupils are equal, round, and reactive to light. No scleral icterus.  Neck: Neck supple.  Cardiovascular: Normal rate, regular rhythm and normal heart sounds.  Exam reveals no gallop and no friction rub.   No murmur heard. Pulmonary/Chest: Effort normal and breath sounds normal. No respiratory distress. She has no wheezes. She has no rales.  Abdominal: Soft. Bowel sounds are normal. She exhibits no distension. There is no tenderness. There is no rebound and no guarding.  Musculoskeletal: She exhibits edema.  Trace B/L LE edema Anterolateral leg pain with right hip ROM  Neurological: She is alert and oriented to person, place, and time. No cranial nerve deficit.  Skin: Skin is warm and dry. She is not diaphoretic.  Psychiatric: She has a normal mood and affect. Her  behavior is normal. Judgment and thought content normal.  Vitals reviewed.         Assessment & Plan:  Please see problem based charting for A&P.

## 2015-09-12 NOTE — Progress Notes (Signed)
Internal Medicine Clinic Attending  Case discussed with Dr. Wilson soon after the resident saw the patient.  We reviewed the resident's history and exam and pertinent patient test results.  I agree with the assessment, diagnosis, and plan of care documented in the resident's note.  

## 2015-10-01 ENCOUNTER — Encounter: Payer: Self-pay | Admitting: *Deleted

## 2015-10-09 ENCOUNTER — Other Ambulatory Visit: Payer: Self-pay | Admitting: Orthopedic Surgery

## 2015-11-11 HISTORY — PX: JOINT REPLACEMENT: SHX530

## 2015-11-13 ENCOUNTER — Ambulatory Visit (INDEPENDENT_AMBULATORY_CARE_PROVIDER_SITE_OTHER): Payer: Medicaid Other | Admitting: Internal Medicine

## 2015-11-13 ENCOUNTER — Ambulatory Visit: Payer: No Typology Code available for payment source

## 2015-11-13 ENCOUNTER — Encounter: Payer: Self-pay | Admitting: Internal Medicine

## 2015-11-13 VITALS — BP 152/89 | HR 80 | Temp 98.3°F | Ht 62.0 in | Wt 215.7 lb

## 2015-11-13 DIAGNOSIS — I10 Essential (primary) hypertension: Secondary | ICD-10-CM | POA: Diagnosis not present

## 2015-11-13 DIAGNOSIS — D509 Iron deficiency anemia, unspecified: Secondary | ICD-10-CM

## 2015-11-13 DIAGNOSIS — J45909 Unspecified asthma, uncomplicated: Secondary | ICD-10-CM | POA: Diagnosis not present

## 2015-11-13 DIAGNOSIS — J4520 Mild intermittent asthma, uncomplicated: Secondary | ICD-10-CM

## 2015-11-13 DIAGNOSIS — D649 Anemia, unspecified: Secondary | ICD-10-CM | POA: Insufficient documentation

## 2015-11-13 DIAGNOSIS — M1611 Unilateral primary osteoarthritis, right hip: Secondary | ICD-10-CM | POA: Diagnosis not present

## 2015-11-13 DIAGNOSIS — L814 Other melanin hyperpigmentation: Secondary | ICD-10-CM | POA: Diagnosis not present

## 2015-11-13 DIAGNOSIS — M150 Primary generalized (osteo)arthritis: Secondary | ICD-10-CM

## 2015-11-13 DIAGNOSIS — M159 Polyosteoarthritis, unspecified: Secondary | ICD-10-CM

## 2015-11-13 MED ORDER — HYDROCHLOROTHIAZIDE 12.5 MG PO CAPS: 12.5000 mg | ORAL_CAPSULE | Freq: Every day | ORAL | 0 refills | Status: DC

## 2015-11-13 MED ORDER — ALBUTEROL SULFATE HFA 108 (90 BASE) MCG/ACT IN AERS: 2.0000 | INHALATION_SPRAY | Freq: Four times a day (QID) | RESPIRATORY_TRACT | 3 refills | Status: DC | PRN

## 2015-11-13 MED ORDER — FERROUS SULFATE 325 (65 FE) MG PO TABS: 325.0000 mg | ORAL_TABLET | Freq: Three times a day (TID) | ORAL | 3 refills | Status: DC

## 2015-11-13 MED ORDER — IBUPROFEN 600 MG PO TABS: 600.0000 mg | ORAL_TABLET | Freq: Every day | ORAL | 0 refills | Status: DC | PRN

## 2015-11-13 NOTE — Assessment & Plan Note (Addendum)
Assessment Patient states she needs a refill on her albuterol inhaler.  She is having total hip replacement surgery on right side and was recommended by surgery to have her albuterol inhaler post surgery.  She carries a diagnosis of asthma and states the last time she used her inhaler was over one year ago.  She states weather changes provokes her wheezing.  Plan Refill Albuterol inhaler

## 2015-11-13 NOTE — Patient Instructions (Signed)
Ms. Avilla 1) Please stop taking metoprolol and start taking Hydrochlorothiazide 12.5mg  daily 2) Your Ibuprofen has been refilled 3)  Please start taking Ferrous Sulfate 325mg  3 times daily with meals 4) Your albuterol inhaler has been refilled 5) Dermatology referral has been made 6) Please come back in one week for lab work

## 2015-11-13 NOTE — Assessment & Plan Note (Signed)
Assessment Patient is having a total hip replacement procedure on 8/16.  She asked for a refill of her Ibuprofen 600mg  daily PRN  Plan Ibuprogen 600mg  daily PRN refill

## 2015-11-13 NOTE — Assessment & Plan Note (Addendum)
Assessment Patient reports brown spots on her posterior heel.  The spots are uniform in color and diameter is slightly less than 12mm in size.  Borders appear regular.  The spots are not raised or pruritic in nature.   Plan Dermatology referral

## 2015-11-13 NOTE — Assessment & Plan Note (Addendum)
Assessment Patient states she is taking Metoprolol 25mg  once daily.  Today her blood pressure is 152/89.  She has had elevated blood pressures on previous visits.  She states she has not tried other blood pressure medications  Plan Stop metoprolol 25mg  once daily and switch to HCTZ 12.5mg  once daily.   In office BMET today and follow up in one week to reassess blood pressure and repeat BMET  Update on 8/10  Patient had pre-op lab work done on 8/7 and does not need to come in today to repeat the same labs.   Called patient to let her know.  She was started on HCTZ 12.5 daily and her potassium was 3.3 and she is not on potassium pills.  I let her know that I filled her potassium prescription and she can start taking those today.  She will need follow up in 3 weeks to reassess blood pressure and repeat lab work.

## 2015-11-13 NOTE — Assessment & Plan Note (Signed)
Assessment Patient on previous lab 07/11/15 showed microcytic anemia.  Patient is having total hip replacement surgery 11/26/15 and will likely have blood loss that could further worsen her anemia.  Plan Ferrous Sulfate 325mg  TID with meals In office today CBC, iron panel and ferritin

## 2015-11-14 LAB — BMP8+ANION GAP
Anion Gap: 16 mmol/L (ref 10.0–18.0)
BUN/Creatinine Ratio: 17 (ref 9–23)
BUN: 13 mg/dL (ref 6–24)
CALCIUM: 9.3 mg/dL (ref 8.7–10.2)
CHLORIDE: 104 mmol/L (ref 96–106)
CO2: 24 mmol/L (ref 18–29)
Creatinine, Ser: 0.77 mg/dL (ref 0.57–1.00)
GFR calc Af Amer: 104 mL/min/{1.73_m2} (ref >59)
GFR calc non Af Amer: 90 mL/min/{1.73_m2} (ref >59)
GLUCOSE: 97 mg/dL (ref 65–99)
Potassium: 3.9 mmol/L (ref 3.5–5.2)
Sodium: 144 mmol/L (ref 134–144)

## 2015-11-14 LAB — IRON AND TIBC
IRON: 14 ug/dL — AB (ref 27–159)
Iron Saturation: 4 % — CL (ref 15–55)
TIBC: 369 ug/dL (ref 250–450)
UIBC: 355 ug/dL (ref 131–425)

## 2015-11-14 LAB — CBC
HEMOGLOBIN: 9.5 g/dL — AB (ref 11.1–15.9)
Hematocrit: 31.1 % — ABNORMAL LOW (ref 34.0–46.6)
MCH: 24.1 pg — AB (ref 26.6–33.0)
MCHC: 30.5 g/dL — AB (ref 31.5–35.7)
MCV: 79 fL (ref 79–97)
Platelets: 421 10*3/uL — ABNORMAL HIGH (ref 150–379)
RBC: 3.95 x10E6/uL (ref 3.77–5.28)
RDW: 17.4 % — ABNORMAL HIGH (ref 12.3–15.4)
WBC: 8.9 10*3/uL (ref 3.4–10.8)

## 2015-11-14 LAB — FERRITIN: FERRITIN: 6 ng/mL — AB (ref 15–150)

## 2015-11-16 NOTE — Progress Notes (Addendum)
   CC: Refill medications   HPI:  [name] is a 50 y.o. female with PMHx of HTN and Osteoarthritis presents to [facility] today for medication refills.  She is going to undergo total hip replacement of right side on 8/16.  She needs a refill on her albuterol inhaler and ibuprogen 600mg .  Her blood pressure in office is 152/89 and has been uncontrolled on previous office visits.  She currently takes metoprolol 25mg  once daily.  She also reports brown spots on the heel of her left foot and would like a dermatology referral.     Past Medical History:  Diagnosis Date  . Adnexal cyst 2011   removed  . Anemia   . Arthritis    "neck; spine" (08/29/2014)  . Asthma   . Chronic lower back pain   . Complication of anesthesia    difficult to awake  . Daily headache 12/2013   "tx'd and no problems anymore" (08/29/2014)  . Family history of adverse reaction to anesthesia    "1 sister and both parents hard to wake up"  . Heart murmur    "dx'd when I was a teenager; haven't had any problems w/it"  . Hip pain, right   . Hypertension     Review of Systems:  Review of Systems  Eyes: Negative for blurred vision.  Respiratory: Negative for shortness of breath.   Cardiovascular: Negative for chest pain and leg swelling.  Gastrointestinal: Negative for abdominal pain.  Skin: Negative for itching.  Neurological: Negative for dizziness and headaches.     Physical Exam:  Vitals:   11/13/15 1345  BP: (!) 152/89  Pulse: 80  Temp: 98.3 F (36.8 C)  TempSrc: Oral  Weight: 215 lb 11.2 oz (97.8 kg)  Height: 5\' 2"  (1.575 m)   Physical Exam  Constitutional: She is well-developed, well-nourished, and in no distress.  In wheel chair   HENT:  Head: Normocephalic and atraumatic.  Cardiovascular: Normal rate and regular rhythm.   Pulmonary/Chest: Effort normal and breath sounds normal.  Abdominal: Soft. There is no tenderness.  Musculoskeletal: She exhibits no edema.  Skin: Skin is warm and  dry.  Left posterior heel 2 spots that are uniform in color, borders appear regular, and diameter is slightly less than 28mm in size.    Assessment & Plan:   See encounters tab for problem based medical decision making.   Patient seen with Dr. Beryle Beams   Medicine attending: I personally interviewed and briefly examined this patient on the day of the patient visit and reviewed pertinent clinical andlaboratory data  with resident physician Dr. Kalman Shan and we discussed a management plan.

## 2015-11-17 ENCOUNTER — Encounter (HOSPITAL_COMMUNITY)
Admission: RE | Admit: 2015-11-17 | Discharge: 2015-11-17 | Disposition: A | Discharge status: Home / Self Care | Payer: Medicaid Other | Source: Ambulatory Visit | Attending: Orthopedic Surgery | Admitting: Orthopedic Surgery

## 2015-11-17 ENCOUNTER — Encounter (HOSPITAL_COMMUNITY): Payer: Self-pay

## 2015-11-17 ENCOUNTER — Ambulatory Visit (HOSPITAL_COMMUNITY)
Admission: RE | Admit: 2015-11-17 | Discharge: 2015-11-17 | Disposition: A | Discharge status: Home / Self Care | Payer: Medicaid Other | Source: Ambulatory Visit | Attending: Orthopedic Surgery | Admitting: Orthopedic Surgery

## 2015-11-17 DIAGNOSIS — R9431 Abnormal electrocardiogram [ECG] [EKG]: Secondary | ICD-10-CM | POA: Diagnosis not present

## 2015-11-17 DIAGNOSIS — Z01812 Encounter for preprocedural laboratory examination: Secondary | ICD-10-CM | POA: Diagnosis not present

## 2015-11-17 DIAGNOSIS — Z01818 Encounter for other preprocedural examination: Secondary | ICD-10-CM | POA: Diagnosis present

## 2015-11-17 DIAGNOSIS — Z01810 Encounter for preprocedural cardiovascular examination: Secondary | ICD-10-CM | POA: Diagnosis not present

## 2015-11-17 HISTORY — DX: Unspecified urinary incontinence: R32

## 2015-11-17 HISTORY — DX: Urinary tract infection, site not specified: N39.0

## 2015-11-17 HISTORY — DX: Personal history of other diseases of the respiratory system: Z87.09

## 2015-11-17 HISTORY — DX: Urgency of urination: R39.15

## 2015-11-17 HISTORY — DX: Carpal tunnel syndrome, left upper limb: G56.02

## 2015-11-17 LAB — CBC WITH DIFFERENTIAL/PLATELET
BASOS ABS: 0 10*3/uL (ref 0.0–0.1)
Basophils Relative: 0 %
Eosinophils Absolute: 0.1 10*3/uL (ref 0.0–0.7)
Eosinophils Relative: 2 %
HEMATOCRIT: 33.2 % — AB (ref 36.0–46.0)
Hemoglobin: 9.8 g/dL — ABNORMAL LOW (ref 12.0–15.0)
LYMPHS PCT: 33 %
Lymphs Abs: 3 10*3/uL (ref 0.7–4.0)
MCH: 23.7 pg — ABNORMAL LOW (ref 26.0–34.0)
MCHC: 29.5 g/dL — ABNORMAL LOW (ref 30.0–36.0)
MCV: 80.2 fL (ref 78.0–100.0)
Monocytes Absolute: 0.4 10*3/uL (ref 0.1–1.0)
Monocytes Relative: 4 %
NEUTROS ABS: 5.7 10*3/uL (ref 1.7–7.7)
NEUTROS PCT: 61 %
Platelets: 392 10*3/uL (ref 150–400)
RBC: 4.14 MIL/uL (ref 3.87–5.11)
RDW: 17.3 % — ABNORMAL HIGH (ref 11.5–15.5)
WBC: 9.2 10*3/uL (ref 4.0–10.5)

## 2015-11-17 LAB — BASIC METABOLIC PANEL
ANION GAP: 7 (ref 5–15)
BUN: 11 mg/dL (ref 6–20)
CALCIUM: 9.1 mg/dL (ref 8.9–10.3)
CHLORIDE: 108 mmol/L (ref 101–111)
CO2: 24 mmol/L (ref 22–32)
CREATININE: 0.71 mg/dL (ref 0.44–1.00)
GFR calc non Af Amer: >60 mL/min (ref >60)
Glucose, Bld: 113 mg/dL — ABNORMAL HIGH (ref 65–99)
Potassium: 3.3 mmol/L — ABNORMAL LOW (ref 3.5–5.1)
SODIUM: 139 mmol/L (ref 135–145)

## 2015-11-17 LAB — URINE MICROSCOPIC-ADD ON

## 2015-11-17 LAB — SURGICAL PCR SCREEN
MRSA, PCR: NEGATIVE
Staphylococcus aureus: NEGATIVE

## 2015-11-17 LAB — HCG, SERUM, QUALITATIVE: Preg, Serum: NEGATIVE

## 2015-11-17 LAB — URINALYSIS, ROUTINE W REFLEX MICROSCOPIC
BILIRUBIN URINE: NEGATIVE
GLUCOSE, UA: NEGATIVE mg/dL
HGB URINE DIPSTICK: NEGATIVE
Ketones, ur: NEGATIVE mg/dL
Nitrite: NEGATIVE
PROTEIN: NEGATIVE mg/dL
SPECIFIC GRAVITY, URINE: 1.024 (ref 1.005–1.030)
pH: 6 (ref 5.0–8.0)

## 2015-11-17 LAB — PROTIME-INR
INR: 1.02
Prothrombin Time: 13.4 seconds (ref 11.4–15.2)

## 2015-11-17 LAB — TYPE AND SCREEN
ABO/RH(D): O POS
ANTIBODY SCREEN: NEGATIVE

## 2015-11-17 LAB — APTT: aPTT: 29 seconds (ref 24–36)

## 2015-11-17 NOTE — Progress Notes (Signed)
Called office to let them know about hemoglobin of 9.8.   (spoke with Lelon Frohlich)

## 2015-11-17 NOTE — Pre-Procedure Instructions (Signed)
Brandi Chase  11/17/2015      [facility] [place] [id number] [place]) [place] [address] Phone: [phone] Fax: [phone]    Your procedure is scheduled on  August 16th, Wednesday   Report to [facility] Admitting at 10:45 AM             (posted surgery time 12:45 - 3:01 pm)   Call this number if you have problems the morning of surgery:  [phone]   Remember:  Do not eat food or drink liquids after midnight Tuesday.  Take these medicines the morning of surgery with A SIP OF WATER : Tramadol             4-5 days prior to surgery, STOP taking any herbal supplements, vitamins, NSAIDS.   Do not wear jewelry, make-up or nail polish.  Do not wear lotions, powders, or perfumes.     Do not shave underarms & legs 48 hours prior to surgery.    Do not bring valuables to the hospital.  [facility] is not responsible for any belongings or valuables.  Contacts, dentures or bridgework may not be worn into surgery.  Leave your suitcase in the car.  After surgery it may be brought to your room.  For patients admitted to the hospital, discharge time will be determined by your treatment team.  Name and phone number of your driver:     Please read over the following fact sheets that you were given. MRSA Information and Surgical Site Infection Prevention

## 2015-11-17 NOTE — Progress Notes (Signed)
PCP - Dr. Kalman Shan at [facility] Cardiologist - denies  EKG- 11/17/15 CXR - 11/17/15  Echo/Stress test/Cardiac Cath - denies  Patient denies chest pain and shortness of breath at PAT appointment.

## 2015-11-19 NOTE — Progress Notes (Addendum)
Anesthesia Chart Review:  Pt is a 50 year old female scheduled for R total hip arthroplasty anterior approach on 11/26/2015 with Frederik Pear, MD.   PCP is Kalman Shan, DO at the [facility], who is aware of upcoming surgery.   PMH includes:  HTN, heart murmur (as a teenager), asthma, anemia, post-op N/V. Never smoker. BMI 39. S/p TLIF 08/29/14.   Medications include: albuterol, iron, hctz, potassium.   Preoperative labs 11/17/15 reviewed.   - H/H 9.8/33.2.  Pt was seen by PCP 11/13/15, hgb at that time was 9.5 and pt was started on iron.  - Many bacteria on UA microscopy.  - I notified Juliann Pulse in Dr. Damita Dunnings office about hgb and UA results.   Chest x-ray 11/17/15 reviewed. No acute abnormalities.   EKG 11/17/15: NSR. Nonspecific T wave abnormality  If no changes, I anticipate pt can proceed with surgery as scheduled.   Willeen Cass, FNP-BC [facility] Short Stay Surgical Center/Anesthesiology Phone: [phone] 11/19/2015 4:20 PM

## 2015-11-20 ENCOUNTER — Other Ambulatory Visit: Payer: Medicaid Other

## 2015-11-20 ENCOUNTER — Telehealth: Payer: Self-pay | Admitting: Internal Medicine

## 2015-11-20 MED ORDER — POTASSIUM CHLORIDE CRYS ER 20 MEQ PO TBCR: 20.0000 meq | EXTENDED_RELEASE_TABLET | Freq: Every day | ORAL | 0 refills | Status: DC

## 2015-11-20 NOTE — Telephone Encounter (Signed)
   Reason for call:   I received a call from [name] around 6 PM indicating she needed to refill her Tramadol.   Pertinent Data:   She expressed frustration that she had to go to the pharmacy three times this past week to pick her medication.  She is not sure why her regular doctor, Dr. Heber Sac Chase, had not refilled her Tramadol.   She has been in pain for the last several days without her medication and does not find adequate relief with ibuprofen.   Assessment / Plan / Recommendations:   Per review of the last office visit 11/13/15 with Dr. Heber South Chase, I do not see a mention of Tramadol.  I explained to the patient that I cannot authorize medications like Tramadol by phone and will route a message to her PCP for further clarification.  As always, pt is advised that if symptoms worsen or new symptoms arise, they should go to an urgent care facility or to to ER for further evaluation.   Riccardo Dubin, MD   11/20/2015, 6:28 PM

## 2015-11-20 NOTE — Addendum Note (Signed)
Addended by: Kalman Shan R on: 11/20/2015 09:14 AM   Modules accepted: Orders

## 2015-11-21 ENCOUNTER — Other Ambulatory Visit: Payer: Self-pay | Admitting: *Deleted

## 2015-11-21 DIAGNOSIS — M159 Polyosteoarthritis, unspecified: Secondary | ICD-10-CM

## 2015-11-21 DIAGNOSIS — M150 Primary generalized (osteo)arthritis: Principal | ICD-10-CM

## 2015-11-21 MED ORDER — TRAMADOL HCL 50 MG PO TABS: 50.0000 mg | ORAL_TABLET | Freq: Every day | ORAL | 0 refills | Status: DC | PRN

## 2015-11-21 NOTE — Telephone Encounter (Signed)
[facility] no refills, will need to be seen in person for further refills.

## 2015-11-21 NOTE — Telephone Encounter (Signed)
See previous telephone note. 

## 2015-11-22 ENCOUNTER — Emergency Department (HOSPITAL_COMMUNITY)
Admission: EM | Admit: 2015-11-22 | Discharge: 2015-11-22 | Disposition: A | Discharge status: Home / Self Care | Payer: Medicaid Other | Attending: Emergency Medicine | Admitting: Emergency Medicine

## 2015-11-22 ENCOUNTER — Encounter (HOSPITAL_COMMUNITY): Payer: Self-pay | Admitting: *Deleted

## 2015-11-22 DIAGNOSIS — M62838 Other muscle spasm: Secondary | ICD-10-CM | POA: Insufficient documentation

## 2015-11-22 DIAGNOSIS — J45909 Unspecified asthma, uncomplicated: Secondary | ICD-10-CM | POA: Diagnosis not present

## 2015-11-22 DIAGNOSIS — Z91040 Latex allergy status: Secondary | ICD-10-CM | POA: Insufficient documentation

## 2015-11-22 DIAGNOSIS — M25551 Pain in right hip: Secondary | ICD-10-CM | POA: Insufficient documentation

## 2015-11-22 DIAGNOSIS — I10 Essential (primary) hypertension: Secondary | ICD-10-CM | POA: Insufficient documentation

## 2015-11-22 DIAGNOSIS — G8929 Other chronic pain: Secondary | ICD-10-CM | POA: Insufficient documentation

## 2015-11-22 MED ORDER — METHOCARBAMOL 500 MG PO TABS: 500.0000 mg | ORAL_TABLET | Freq: Two times a day (BID) | ORAL | 0 refills | Status: DC

## 2015-11-22 NOTE — ED Provider Notes (Signed)
[facility] DEPT Provider Note   CSN: [id number] Arrival date & time: 11/22/15  [id number]  First Provider Contact:  First MD Initiated Contact with Patient 11/22/15 0546        History   Chief Complaint Chief Complaint  Patient presents with  . Hip Pain    HPI Brandi Chase is a 50 y.o. female.  The history is provided by the patient.  Hip Pain  This is a chronic problem. The current episode started more than 1 week ago. The problem occurs constantly. The problem has been gradually worsening. The symptoms are aggravated by walking. The symptoms are relieved by rest.   Patient with h/o chronic right hip pain She is scheduled to have total right hip replacement next week She reports pain and spasms are worsening in the right hip No other new complaints She can ambulate Past Medical History:  Diagnosis Date  . Adnexal cyst 2011   removed  . Anemia   . Arthritis    "neck; spine" (08/29/2014)  . Asthma   . Carpal tunnel syndrome of left wrist   . Chronic lower back pain   . Complication of anesthesia    difficult to awake  . Daily headache 12/2013   "tx'd and no problems anymore" (08/29/2014)  . Family history of adverse reaction to anesthesia    "1 sister and both parents hard to wake up"  . Heart murmur    "dx'd when I was a teenager; haven't had any problems w/it"  . Hip pain, right   . History of bronchitis   . Hypertension   . PONV (postoperative nausea and vomiting)   . Urinary incontinence   . Urinary urgency   . UTI (lower urinary tract infection)     Patient Active Problem List   Diagnosis Date Noted  . Absolute anemia 11/13/2015  . Solar lentigo 11/13/2015  . Iron deficiency anemia due to chronic blood loss 08/15/2015  . DUB (dysfunctional uterine bleeding) 08/15/2015  . Uterus, adenomyosis 07/25/2015  . Dysuria 07/18/2015  . 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 Dysfunctional uterine bleeding 07/11/2015  . Essential hypertension 04/17/2015  .  Healthcare maintenance 04/17/2015  . Back pain 08/29/2014  . H/O pelvic mass 05/24/2013  . Sore throat 12/11/2010  . Allergic rhinitis 12/11/2010  . SHOULDER PAIN, LEFT 03/05/2009  . Asthma 09/12/2008  . Osteoarthritis 09/12/2008    Past Surgical History:  Procedure Laterality Date  . BACK SURGERY     x2  . OVARIAN CYST REMOVAL Right 2011  . REDUCTION MAMMAPLASTY Bilateral 1995  . TRANSFORAMINAL LUMBAR INTERBODY FUSION (TLIF) WITH PEDICLE SCREW FIXATION 2 LEVEL  08/29/2014   L4-S1  . TRANSFORAMINAL LUMBAR INTERBODY FUSION (TLIF) WITH PEDICLE SCREW FIXATION 2 LEVEL N/A 08/29/2014   Procedure: TRANSFORAMINAL LUMBAR INTERBODY FUSION (TLIF) L4-S1;  Surgeon: Melina Schools, MD;  Location: [facility];  Service: Orthopedics;  Laterality: N/A;  . TUBAL LIGATION  1992    OB History    Gravida Para Term Preterm AB Living   3 3       3    SAB TAB Ectopic Multiple Live Births                   Home Medications    Prior to Admission medications   Medication Sig Start Date End Date Taking? Authorizing Provider  albuterol (PROVENTIL HFA;VENTOLIN HFA) 108 (90 Base) MCG/ACT inhaler Inhale 2 puffs into the lungs every 6 (six) hours as needed for wheezing or shortness of breath.  11/13/15  Yes Jessica Ratliff Hoffman, DO  Ascorbic Acid (VITAMIN C PO) Take 1 tablet by mouth daily.   Yes Historical Provider, MD  ferrous sulfate 325 (65 FE) MG tablet Take 1 tablet (325 mg total) by mouth 3 (three) times daily with meals. 11/13/15  Yes Jessica Ratliff Hoffman, DO  hydrochlorothiazide (MICROZIDE) 12.5 MG capsule Take 1 capsule (12.5 mg total) by mouth daily. 11/13/15 11/12/16 Yes Jessica Ratliff Hoffman, DO  ibuprofen (ADVIL,MOTRIN) 600 MG tablet Take 1 tablet (600 mg total) by mouth daily as needed for mild pain or moderate pain. 11/13/15  Yes Jessica Ratliff Hoffman, DO  magnesium oxide (MAG-OX) 400 MG tablet Take 400 mg by mouth every morning.    Yes Historical Provider, MD  megestrol (MEGACE) 20 MG tablet Take 2  tablets (40 mg total) by mouth daily. Start with 2 tabs every 8 hrs x 2 days, then 2 tabs every 12 hrs, then two tabs daily Patient taking differently: Take 40 mg by mouth daily.  07/11/15  Yes Seabron Spates, CNM  potassium chloride SA (K-DUR,KLOR-CON) 20 MEQ tablet Take 1 tablet (20 mEq total) by mouth daily. 11/20/15  Yes Jessica Ratliff Hoffman, DO  methocarbamol (ROBAXIN) 500 MG tablet Take 1 tablet (500 mg total) by mouth 2 (two) times daily. 11/22/15   Ripley Fraise, MD  traMADol (ULTRAM) 50 MG tablet Take 1 tablet (50 mg total) by mouth daily as needed. 11/21/15   Valinda Party, DO    Family History No family history on file.  Social History Social History  Substance Use Topics  . Smoking status: Never Smoker  . Smokeless tobacco: Never Used  . Alcohol use No     Allergies   Other; Primatene mist [epinephrine]; Shrimp [shellfish allergy]; Aleve [naproxen sodium]; Midol [ibuprofen]; Avocado; Bactrim ds [sulfamethoxazole w/trimethoprim (co-trimoxazole)]; Cocoa butter; and Latex   Review of Systems Review of Systems  Constitutional: Negative for fever.  Musculoskeletal: Positive for arthralgias. Negative for back pain.     Physical Exam Updated Vital Signs BP 139/73   Pulse 88   Temp 97.9 F (36.6 C) (Oral)   Resp 18   Ht 5\' 2"  (1.575 m)   Wt 97.1 kg   LMP 10/21/2015   SpO2 100%   BMI 39.14 kg/m   Physical Exam  CONSTITUTIONAL: Well developed/well nourished HEAD: Normocephalic/atraumatic ENMT: Mucous membranes moist NECK: supple no meningeal signs SPINE/BACK:entire spine nontender CV: S1/S2 noted, no murmurs/rubs/gallops noted LUNGS: Lungs are clear to auscultation bilaterally ABDOMEN: soft, nontender NEURO: Pt is awake/alert/appropriate, moves all extremitiesx4.  No facial droop.   EXTREMITIES: pulses normal/equal, full ROM.  No deformity to lower extremities.  No LE edema.  Distal pulses equal/intact.  Mild tenderness with ROM of right hip.   Pelvis stable SKIN: warm, color normal PSYCH: no abnormalities of mood noted, alert and oriented to situation  ED Treatments / Results  Labs (all labs ordered are listed, but only abnormal results are displayed) Labs Reviewed - No data to display  EKG  EKG Interpretation None       Radiology No results found.  Procedures Procedures (including critical care time)  Medications Ordered in ED Medications - No data to display   Initial Impression / Assessment and Plan / ED Course  I have reviewed the triage vital signs and the nursing notes.    Clinical Course   Pt well appearing She has chronic hip pain with surgery scheduled next week She reports spasms - will prescribe robaxin Advised chronic  pain meds must be prescribed by her PCP  Narcotic database reviewed and considered in decision making   Final Clinical Impressions(s) / ED Diagnoses   Final diagnoses:  Muscle spasm  Chronic right hip pain    New Prescriptions New Prescriptions   METHOCARBAMOL (ROBAXIN) 500 MG TABLET    Take 1 tablet (500 mg total) by mouth 2 (two) times daily.     Ripley Fraise, MD 11/22/15 [phone]

## 2015-11-22 NOTE — ED Notes (Signed)
Reports that she has upcoming R hip replacement scheduled for Wednesday. Also, reports that she has been waiting 15 months for this surgery.

## 2015-11-22 NOTE — ED Triage Notes (Signed)
Pt with chronic hip pain awaiting a total hip replacement is here for worsening right hip pain, not associated with any recent trauma.

## 2015-11-23 DIAGNOSIS — M1611 Unilateral primary osteoarthritis, right hip: Secondary | ICD-10-CM | POA: Diagnosis present

## 2015-11-24 ENCOUNTER — Telehealth: Payer: Self-pay

## 2015-11-24 NOTE — Telephone Encounter (Signed)
Needs to speak with a nurse regarding tramadol. Please call back.

## 2015-11-24 NOTE — Telephone Encounter (Signed)
Called pt, called [facility], script on [facility], they will fill now, pt informed

## 2015-11-25 MED ORDER — TRANEXAMIC ACID 1000 MG/10ML IV SOLN
2000.0000 mg | INTRAVENOUS | Status: AC
  Administered 2015-11-26: 2000 mg via TOPICAL
  Filled 2015-11-25: qty 20

## 2015-11-25 MED ORDER — BUPIVACAINE LIPOSOME 1.3 % IJ SUSP
20.0000 mL | INTRAMUSCULAR | Status: AC
  Administered 2015-11-26: 20 mL
  Filled 2015-11-25: qty 20

## 2015-11-25 MED ORDER — DEXTROSE-NACL 5-0.45 % IV SOLN: INTRAVENOUS | Status: DC

## 2015-11-25 MED ORDER — CEFAZOLIN SODIUM-DEXTROSE 2-4 GM/100ML-% IV SOLN
2.0000 g | INTRAVENOUS | Status: AC
  Administered 2015-11-26: 2 g via INTRAVENOUS
  Filled 2015-11-25: qty 100

## 2015-11-25 MED ORDER — TRANEXAMIC ACID 1000 MG/10ML IV SOLN
1000.0000 mg | INTRAVENOUS | Status: AC
  Administered 2015-11-26: 1000 mg via INTRAVENOUS
  Filled 2015-11-25: qty 10

## 2015-11-25 NOTE — H&P (Signed)
TOTAL HIP ADMISSION H&P  Patient is admitted for right total hip arthroplasty.  Subjective:  Chief Complaint: right hip pain  HPI: Brandi Chase, 50 y.o. female, has a history of pain and functional disability in the right hip(s) due to arthritis and patient has failed non-surgical conservative treatments for greater than 12 weeks to include NSAID's and/or analgesics, flexibility and strengthening excercises, use of assistive devices, weight reduction as appropriate and activity modification.  Onset of symptoms was gradual starting 2 years ago with gradually worsening course since that time.The patient noted no past surgery on the right hip(s).  Patient currently rates pain in the right hip at 10 out of 10 with activity. Patient has night pain, worsening of pain with activity and weight bearing, pain that interfers with activities of daily living and pain with passive range of motion. Patient has evidence of subchondral cysts, joint subluxation and joint space narrowing by imaging studies. This condition presents safety issues increasing the risk of falls.  There is no current active infection.  Patient Active Problem List   Diagnosis Date Noted  . Primary localized osteoarthritis of right hip 11/23/2015  . Absolute anemia 11/13/2015  . Solar lentigo 11/13/2015  . Iron deficiency anemia due to chronic blood loss 08/15/2015  . DUB (dysfunctional uterine bleeding) 08/15/2015  . Uterus, adenomyosis 07/25/2015  . Dysuria 07/18/2015  . 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 Dysfunctional uterine bleeding 07/11/2015  . Essential hypertension 04/17/2015  . Healthcare maintenance 04/17/2015  . Back pain 08/29/2014  . H/O pelvic mass 05/24/2013  . Sore throat 12/11/2010  . Allergic rhinitis 12/11/2010  . SHOULDER PAIN, LEFT 03/05/2009  . Asthma 09/12/2008  . Osteoarthritis 09/12/2008   Past Medical History:  Diagnosis Date  . Adnexal cyst 2011   removed  . Anemia   . Arthritis    "neck;  spine" (08/29/2014)  . Asthma   . Carpal tunnel syndrome of left wrist   . Chronic lower back pain   . Complication of anesthesia    difficult to awake  . Daily headache 12/2013   "tx'd and no problems anymore" (08/29/2014)  . Family history of adverse reaction to anesthesia    "1 sister and both parents hard to wake up"  . Heart murmur    "dx'd when I was a teenager; haven't had any problems w/it"  . Hip pain, right   . History of bronchitis   . Hypertension   . PONV (postoperative nausea and vomiting)   . Urinary incontinence   . Urinary urgency   . UTI (lower urinary tract infection)     Past Surgical History:  Procedure Laterality Date  . BACK SURGERY     x2  . OVARIAN CYST REMOVAL Right 2011  . REDUCTION MAMMAPLASTY Bilateral 1995  . TRANSFORAMINAL LUMBAR INTERBODY FUSION (TLIF) WITH PEDICLE SCREW FIXATION 2 LEVEL  08/29/2014   L4-S1  . TRANSFORAMINAL LUMBAR INTERBODY FUSION (TLIF) WITH PEDICLE SCREW FIXATION 2 LEVEL N/A 08/29/2014   Procedure: TRANSFORAMINAL LUMBAR INTERBODY FUSION (TLIF) L4-S1;  Surgeon: Melina Schools, MD;  Location: [place];  Service: Orthopedics;  Laterality: N/A;  . TUBAL LIGATION  1992    No prescriptions prior to admission.   Allergies  Allergen Reactions  . Other Anaphylaxis    ** PECANS **  . Primatene Mist [Epinephrine] Other (See Comments)    Blacked out  . Shrimp [Shellfish Allergy] Anaphylaxis    Causes vision loss.  Tori Milks [Naproxen Sodium] Other (See Comments)    Chest  pains  . Bactrim Ds [Sulfamethoxazole W/Trimethoprim (Co-Trimoxazole)] Other (See Comments)    HALLUCINATIONS Seeing people while taking med.  Marland Kitchen Avocado Rash  . Cocoa Butter Itching  . Latex Itching  . Midol [Ibuprofen] Other (See Comments)    Caused aggitation.    Social History  Substance Use Topics  . Smoking status: Never Smoker  . Smokeless tobacco: Never Used  . Alcohol use No    No family history on file.   Review of Systems  Constitutional: Positive  for malaise/fatigue.  HENT: Negative.   Eyes: Negative.   Cardiovascular: Positive for leg swelling.       HTN  Gastrointestinal: Negative.        Bowel changes  Genitourinary:       Poor bladder control  Musculoskeletal: Positive for joint pain and myalgias.  Skin: Negative.   Neurological: Positive for focal weakness.  Endo/Heme/Allergies: Negative.   Psychiatric/Behavioral: Negative.     Objective:  Physical Exam  Constitutional: She is oriented to person, place, and time. She appears well-developed and well-nourished.  HENT:  Head: Normocephalic and atraumatic.  Eyes: Pupils are equal, round, and reactive to light.  Neck: Normal range of motion. Neck supple.  Cardiovascular: Intact distal pulses.   Respiratory: Effort normal.  Musculoskeletal: She exhibits tenderness.  In her right hip she has severe pain with any motion.  Very limited internal and external rotation to approximately 5.  The left hip also has stiffness with internal rotation to approximately 5-10.  Increased external rotation to approximately 15.  Positive heel bump on the right.  Her calves are soft and nontender.  She is neurovascularly intact distally.  Neurological: She is alert and oriented to person, place, and time.  Skin: Skin is warm and dry.  Psychiatric: She has a normal mood and affect. Her behavior is normal. Judgment and thought content normal.    Vital signs in last 24 hours:    Labs:   Estimated body mass index is 39.14 kg/m as calculated from the following:   Height as of 11/22/15: 5\' 2"  (1.575 m).   Weight as of 11/22/15: 97.1 kg (214 lb).   Imaging Review Plain radiographs demonstrate end-stage bone-on-bone arthritis of the right hip with periarticular osteophyte formation and subchondral cyst formation.  Patient's left hip also has near end-stage arthritis.   Assessment/Plan:  End stage arthritis, right hip(s)  The patient history, physical examination, clinical judgement of  the provider and imaging studies are consistent with end stage degenerative joint disease of the right hip(s) and total hip arthroplasty is deemed medically necessary. The treatment options including medical management, injection therapy, arthroscopy and arthroplasty were discussed at length. The risks and benefits of total hip arthroplasty were presented and reviewed. The risks due to aseptic loosening, infection, stiffness, dislocation/subluxation,  thromboembolic complications and other imponderables were discussed.  The patient acknowledged the explanation, agreed to proceed with the plan and consent was signed. Patient is being admitted for inpatient treatment for surgery, pain control, PT, OT, prophylactic antibiotics, VTE prophylaxis, progressive ambulation and ADL's and discharge planning.The patient is planning to be discharged home with home health services

## 2015-11-26 ENCOUNTER — Inpatient Hospital Stay (HOSPITAL_COMMUNITY)
Admission: RE | Admit: 2015-11-26 | Discharge: 2015-11-28 | DRG: 470 | Disposition: A | Discharge status: Home Health | Payer: Medicaid Other | Source: Ambulatory Visit | Attending: Orthopedic Surgery | Admitting: Orthopedic Surgery

## 2015-11-26 ENCOUNTER — Inpatient Hospital Stay (HOSPITAL_COMMUNITY): Payer: Medicaid Other | Admitting: Anesthesiology

## 2015-11-26 ENCOUNTER — Inpatient Hospital Stay (HOSPITAL_COMMUNITY): Payer: Medicaid Other | Admitting: Emergency Medicine

## 2015-11-26 ENCOUNTER — Encounter (HOSPITAL_COMMUNITY): Payer: Self-pay | Admitting: *Deleted

## 2015-11-26 ENCOUNTER — Encounter (HOSPITAL_COMMUNITY)
Admission: RE | Disposition: A | Discharge status: Home Health | Payer: Self-pay | Source: Ambulatory Visit | Attending: Orthopedic Surgery

## 2015-11-26 ENCOUNTER — Inpatient Hospital Stay (HOSPITAL_COMMUNITY): Payer: Medicaid Other

## 2015-11-26 DIAGNOSIS — I10 Essential (primary) hypertension: Secondary | ICD-10-CM | POA: Diagnosis present

## 2015-11-26 DIAGNOSIS — J45909 Unspecified asthma, uncomplicated: Secondary | ICD-10-CM | POA: Diagnosis present

## 2015-11-26 DIAGNOSIS — Z91013 Allergy to seafood: Secondary | ICD-10-CM | POA: Diagnosis not present

## 2015-11-26 DIAGNOSIS — Z79890 Hormone replacement therapy (postmenopausal): Secondary | ICD-10-CM | POA: Diagnosis not present

## 2015-11-26 DIAGNOSIS — Z882 Allergy status to sulfonamides status: Secondary | ICD-10-CM

## 2015-11-26 DIAGNOSIS — Z6839 Body mass index (BMI) 39.0-39.9, adult: Secondary | ICD-10-CM

## 2015-11-26 DIAGNOSIS — D500 Iron deficiency anemia secondary to blood loss (chronic): Secondary | ICD-10-CM | POA: Diagnosis present

## 2015-11-26 DIAGNOSIS — D62 Acute posthemorrhagic anemia: Secondary | ICD-10-CM | POA: Diagnosis not present

## 2015-11-26 DIAGNOSIS — Z419 Encounter for procedure for purposes other than remedying health state, unspecified: Secondary | ICD-10-CM

## 2015-11-26 DIAGNOSIS — Z91018 Allergy to other foods: Secondary | ICD-10-CM | POA: Diagnosis not present

## 2015-11-26 DIAGNOSIS — M25551 Pain in right hip: Secondary | ICD-10-CM | POA: Diagnosis present

## 2015-11-26 DIAGNOSIS — Z91040 Latex allergy status: Secondary | ICD-10-CM

## 2015-11-26 DIAGNOSIS — Z886 Allergy status to analgesic agent status: Secondary | ICD-10-CM

## 2015-11-26 DIAGNOSIS — Z888 Allergy status to other drugs, medicaments and biological substances status: Secondary | ICD-10-CM

## 2015-11-26 DIAGNOSIS — M1611 Unilateral primary osteoarthritis, right hip: Principal | ICD-10-CM | POA: Diagnosis present

## 2015-11-26 DIAGNOSIS — E669 Obesity, unspecified: Secondary | ICD-10-CM | POA: Diagnosis present

## 2015-11-26 HISTORY — PX: TOTAL HIP ARTHROPLASTY: SHX124

## 2015-11-26 SURGERY — ARTHROPLASTY, HIP, TOTAL, ANTERIOR APPROACH
Anesthesia: General | Site: Hip | Laterality: Right

## 2015-11-26 MED ORDER — MEGESTROL ACETATE 40 MG PO TABS
40.0000 mg | ORAL_TABLET | Freq: Every day | ORAL | Status: DC
  Administered 2015-11-26 – 2015-11-28 (×3): 40 mg via ORAL
  Filled 2015-11-26 (×3): qty 1

## 2015-11-26 MED ORDER — FENTANYL CITRATE (PF) 100 MCG/2ML IJ SOLN
INTRAMUSCULAR | Status: AC
  Filled 2015-11-26: qty 2

## 2015-11-26 MED ORDER — LACTATED RINGERS IV SOLN
INTRAVENOUS | Status: DC | PRN
  Administered 2015-11-26 (×3): via INTRAVENOUS

## 2015-11-26 MED ORDER — ACETAMINOPHEN 325 MG PO TABS
650.0000 mg | ORAL_TABLET | Freq: Four times a day (QID) | ORAL | Status: DC | PRN
  Administered 2015-11-27 (×2): 650 mg via ORAL
  Filled 2015-11-26 (×3): qty 2

## 2015-11-26 MED ORDER — PROPOFOL 10 MG/ML IV BOLUS
INTRAVENOUS | Status: AC
  Filled 2015-11-26: qty 20

## 2015-11-26 MED ORDER — ALUM & MAG HYDROXIDE-SIMETH 200-200-20 MG/5ML PO SUSP: 30.0000 mL | ORAL | Status: DC | PRN

## 2015-11-26 MED ORDER — METHOCARBAMOL 500 MG PO TABS: 500.0000 mg | ORAL_TABLET | Freq: Four times a day (QID) | ORAL | 0 refills | Status: DC | PRN

## 2015-11-26 MED ORDER — HYDROMORPHONE HCL 1 MG/ML IJ SOLN
INTRAMUSCULAR | Status: AC
  Filled 2015-11-26: qty 1

## 2015-11-26 MED ORDER — HYDROMORPHONE HCL 1 MG/ML IJ SOLN
0.2500 mg | INTRAMUSCULAR | Status: DC | PRN
  Administered 2015-11-26: 0.5 mg via INTRAVENOUS

## 2015-11-26 MED ORDER — SCOPOLAMINE 1 MG/3DAYS TD PT72
MEDICATED_PATCH | TRANSDERMAL | Status: AC
  Filled 2015-11-26: qty 1

## 2015-11-26 MED ORDER — BUPIVACAINE-EPINEPHRINE 0.25% -1:200000 IJ SOLN
INTRAMUSCULAR | Status: DC | PRN
  Administered 2015-11-26: 30 mL

## 2015-11-26 MED ORDER — POTASSIUM CHLORIDE CRYS ER 20 MEQ PO TBCR
20.0000 meq | EXTENDED_RELEASE_TABLET | Freq: Every day | ORAL | Status: DC
  Administered 2015-11-26 – 2015-11-28 (×3): 20 meq via ORAL
  Filled 2015-11-26 (×3): qty 1

## 2015-11-26 MED ORDER — HYDROMORPHONE HCL 1 MG/ML IJ SOLN: 0.5000 mg | INTRAMUSCULAR | Status: DC | PRN

## 2015-11-26 MED ORDER — FLEET ENEMA 7-19 GM/118ML RE ENEM: 1.0000 | ENEMA | Freq: Once | RECTAL | Status: DC | PRN

## 2015-11-26 MED ORDER — ASPIRIN EC 325 MG PO TBEC: 325.0000 mg | DELAYED_RELEASE_TABLET | Freq: Two times a day (BID) | ORAL | 0 refills | Status: DC

## 2015-11-26 MED ORDER — OXYCODONE HCL 5 MG PO TABS: 5.0000 mg | ORAL_TABLET | Freq: Once | ORAL | Status: DC | PRN

## 2015-11-26 MED ORDER — LIDOCAINE HCL (CARDIAC) 20 MG/ML IV SOLN
INTRAVENOUS | Status: DC | PRN
  Administered 2015-11-26: 50 mg via INTRAVENOUS

## 2015-11-26 MED ORDER — METHOCARBAMOL 500 MG PO TABS
ORAL_TABLET | ORAL | Status: AC
  Filled 2015-11-26: qty 1

## 2015-11-26 MED ORDER — SENNOSIDES-DOCUSATE SODIUM 8.6-50 MG PO TABS: 1.0000 | ORAL_TABLET | Freq: Every evening | ORAL | Status: DC | PRN

## 2015-11-26 MED ORDER — MIDAZOLAM HCL 5 MG/5ML IJ SOLN
INTRAMUSCULAR | Status: DC | PRN
  Administered 2015-11-26: 2 mg via INTRAVENOUS

## 2015-11-26 MED ORDER — ONDANSETRON HCL 4 MG/2ML IJ SOLN: 4.0000 mg | Freq: Four times a day (QID) | INTRAMUSCULAR | Status: DC | PRN

## 2015-11-26 MED ORDER — METOCLOPRAMIDE HCL 5 MG/ML IJ SOLN: 5.0000 mg | Freq: Three times a day (TID) | INTRAMUSCULAR | Status: DC | PRN

## 2015-11-26 MED ORDER — METHOCARBAMOL 500 MG PO TABS
500.0000 mg | ORAL_TABLET | Freq: Four times a day (QID) | ORAL | Status: DC | PRN
  Administered 2015-11-26 – 2015-11-28 (×4): 500 mg via ORAL
  Filled 2015-11-26 (×4): qty 1

## 2015-11-26 MED ORDER — MIDAZOLAM HCL 2 MG/2ML IJ SOLN
INTRAMUSCULAR | Status: AC
  Filled 2015-11-26: qty 2

## 2015-11-26 MED ORDER — DEXAMETHASONE SODIUM PHOSPHATE 10 MG/ML IJ SOLN
10.0000 mg | Freq: Once | INTRAMUSCULAR | Status: AC
  Administered 2015-11-27: 10 mg via INTRAVENOUS
  Filled 2015-11-26: qty 1

## 2015-11-26 MED ORDER — HYDROCHLOROTHIAZIDE 12.5 MG PO CAPS
12.5000 mg | ORAL_CAPSULE | Freq: Every day | ORAL | Status: DC
  Administered 2015-11-26 – 2015-11-28 (×3): 12.5 mg via ORAL
  Filled 2015-11-26 (×3): qty 1

## 2015-11-26 MED ORDER — ONDANSETRON HCL 4 MG/2ML IJ SOLN: 4.0000 mg | Freq: Once | INTRAMUSCULAR | Status: DC | PRN

## 2015-11-26 MED ORDER — SCOPOLAMINE 1 MG/3DAYS TD PT72
1.0000 | MEDICATED_PATCH | TRANSDERMAL | Status: DC
  Administered 2015-11-26: 1.5 mg via TRANSDERMAL

## 2015-11-26 MED ORDER — OXYCODONE HCL 5 MG/5ML PO SOLN
5.0000 mg | Freq: Once | ORAL | Status: DC | PRN
Start: 2015-11-26 — End: 2015-11-26

## 2015-11-26 MED ORDER — FENTANYL CITRATE (PF) 100 MCG/2ML IJ SOLN
INTRAMUSCULAR | Status: DC | PRN
  Administered 2015-11-26: 100 ug via INTRAVENOUS
  Administered 2015-11-26: 150 ug via INTRAVENOUS
  Administered 2015-11-26: 50 ug via INTRAVENOUS

## 2015-11-26 MED ORDER — OXYCODONE-ACETAMINOPHEN 5-325 MG PO TABS: 1.0000 | ORAL_TABLET | ORAL | 0 refills | Status: DC | PRN

## 2015-11-26 MED ORDER — MENTHOL 3 MG MT LOZG: 1.0000 | LOZENGE | OROMUCOSAL | Status: DC | PRN

## 2015-11-26 MED ORDER — PROPOFOL 10 MG/ML IV BOLUS
INTRAVENOUS | Status: DC | PRN
  Administered 2015-11-26: 200 mg via INTRAVENOUS

## 2015-11-26 MED ORDER — PHENOL 1.4 % MT LIQD: 1.0000 | OROMUCOSAL | Status: DC | PRN

## 2015-11-26 MED ORDER — ONDANSETRON HCL 4 MG PO TABS: 4.0000 mg | ORAL_TABLET | Freq: Four times a day (QID) | ORAL | Status: DC | PRN

## 2015-11-26 MED ORDER — METOCLOPRAMIDE HCL 5 MG PO TABS: 5.0000 mg | ORAL_TABLET | Freq: Three times a day (TID) | ORAL | Status: DC | PRN

## 2015-11-26 MED ORDER — BUPIVACAINE-EPINEPHRINE (PF) 0.25% -1:200000 IJ SOLN
INTRAMUSCULAR | Status: AC
  Filled 2015-11-26: qty 30

## 2015-11-26 MED ORDER — BISACODYL 5 MG PO TBEC
5.0000 mg | DELAYED_RELEASE_TABLET | Freq: Every day | ORAL | Status: DC | PRN
  Administered 2015-11-27: 5 mg via ORAL
  Filled 2015-11-26: qty 1

## 2015-11-26 MED ORDER — KCL IN DEXTROSE-NACL 20-5-0.45 MEQ/L-%-% IV SOLN
INTRAVENOUS | Status: DC
  Administered 2015-11-26: 21:00:00 via INTRAVENOUS
  Filled 2015-11-26 (×3): qty 1000

## 2015-11-26 MED ORDER — ALBUTEROL SULFATE (2.5 MG/3ML) 0.083% IN NEBU: 3.0000 mL | INHALATION_SOLUTION | Freq: Four times a day (QID) | RESPIRATORY_TRACT | Status: DC | PRN

## 2015-11-26 MED ORDER — DOCUSATE SODIUM 100 MG PO CAPS
100.0000 mg | ORAL_CAPSULE | Freq: Two times a day (BID) | ORAL | Status: DC
  Administered 2015-11-26 – 2015-11-28 (×4): 100 mg via ORAL
  Filled 2015-11-26 (×4): qty 1

## 2015-11-26 MED ORDER — ONDANSETRON HCL 4 MG/2ML IJ SOLN
INTRAMUSCULAR | Status: DC | PRN
  Administered 2015-11-26: 4 mg via INTRAVENOUS

## 2015-11-26 MED ORDER — LACTATED RINGERS IV SOLN: INTRAVENOUS | Status: DC

## 2015-11-26 MED ORDER — ASPIRIN EC 325 MG PO TBEC
325.0000 mg | DELAYED_RELEASE_TABLET | Freq: Every day | ORAL | Status: DC
  Administered 2015-11-27 – 2015-11-28 (×2): 325 mg via ORAL
  Filled 2015-11-26 (×2): qty 1

## 2015-11-26 MED ORDER — ACETAMINOPHEN 650 MG RE SUPP: 650.0000 mg | Freq: Four times a day (QID) | RECTAL | Status: DC | PRN

## 2015-11-26 MED ORDER — DIPHENHYDRAMINE HCL 12.5 MG/5ML PO ELIX: 12.5000 mg | ORAL_SOLUTION | ORAL | Status: DC | PRN

## 2015-11-26 MED ORDER — FERROUS SULFATE 325 (65 FE) MG PO TABS
325.0000 mg | ORAL_TABLET | Freq: Three times a day (TID) | ORAL | Status: DC
  Administered 2015-11-27 – 2015-11-28 (×4): 325 mg via ORAL
  Filled 2015-11-26 (×4): qty 1

## 2015-11-26 MED ORDER — CHLORHEXIDINE GLUCONATE 4 % EX LIQD: 60.0000 mL | Freq: Once | CUTANEOUS | Status: DC

## 2015-11-26 MED ORDER — 0.9 % SODIUM CHLORIDE (POUR BTL) OPTIME
TOPICAL | Status: DC | PRN
  Administered 2015-11-26: 1000 mL

## 2015-11-26 MED ORDER — MEPERIDINE HCL 25 MG/ML IJ SOLN: 6.2500 mg | INTRAMUSCULAR | Status: DC | PRN

## 2015-11-26 MED ORDER — SUGAMMADEX SODIUM 200 MG/2ML IV SOLN
INTRAVENOUS | Status: DC | PRN
  Administered 2015-11-26: 192.4 mg via INTRAVENOUS

## 2015-11-26 MED ORDER — OXYCODONE HCL 5 MG PO TABS
5.0000 mg | ORAL_TABLET | ORAL | Status: DC | PRN
  Administered 2015-11-26 – 2015-11-28 (×7): 10 mg via ORAL
  Filled 2015-11-26 (×5): qty 2
  Filled 2015-11-26: qty 1
  Filled 2015-11-26: qty 2

## 2015-11-26 MED ORDER — METHOCARBAMOL 1000 MG/10ML IJ SOLN
500.0000 mg | Freq: Four times a day (QID) | INTRAVENOUS | Status: DC | PRN
  Filled 2015-11-26: qty 5

## 2015-11-26 MED ORDER — OXYCODONE HCL 5 MG PO TABS
ORAL_TABLET | ORAL | Status: AC
  Filled 2015-11-26: qty 2

## 2015-11-26 MED ORDER — ROCURONIUM BROMIDE 100 MG/10ML IV SOLN
INTRAVENOUS | Status: DC | PRN
  Administered 2015-11-26: 50 mg via INTRAVENOUS

## 2015-11-26 SURGICAL SUPPLY — 46 items
BLADE SURG ROTATE 9660 (MISCELLANEOUS) IMPLANT
CAPT HIP TOTAL 2 ×2 IMPLANT
COVER PERINEAL POST (MISCELLANEOUS) ×3 IMPLANT
COVER SURGICAL LIGHT HANDLE (MISCELLANEOUS) ×3 IMPLANT
DRAPE C-ARM 42X72 X-RAY (DRAPES) ×3 IMPLANT
DRAPE STERI IOBAN 125X83 (DRAPES) ×3 IMPLANT
DRAPE U-SHAPE 47X51 STRL (DRAPES) ×6 IMPLANT
DRSG AQUACEL AG ADV 3.5X10 (GAUZE/BANDAGES/DRESSINGS) ×3 IMPLANT
DURAPREP 26ML APPLICATOR (WOUND CARE) ×3 IMPLANT
ELECT BLADE 4.0 EZ CLEAN MEGAD (MISCELLANEOUS) ×3
ELECT REM PT RETURN 9FT ADLT (ELECTROSURGICAL) ×3
ELECTRODE BLDE 4.0 EZ CLN MEGD (MISCELLANEOUS) ×1 IMPLANT
ELECTRODE REM PT RTRN 9FT ADLT (ELECTROSURGICAL) ×1 IMPLANT
FACESHIELD WRAPAROUND (MASK) ×6 IMPLANT
FACESHIELD WRAPAROUND OR TEAM (MASK) ×2 IMPLANT
GLOVE BIO SURGEON STRL SZ7.5 (GLOVE) ×1 IMPLANT
GLOVE BIO SURGEON STRL SZ8.5 (GLOVE) ×1 IMPLANT
GLOVE BIOGEL PI IND STRL 8 (GLOVE) ×1 IMPLANT
GLOVE BIOGEL PI IND STRL 9 (GLOVE) ×1 IMPLANT
GLOVE BIOGEL PI INDICATOR 8 (GLOVE) ×2
GLOVE BIOGEL PI INDICATOR 9 (GLOVE) ×2
GOWN STRL REUS W/ TWL LRG LVL3 (GOWN DISPOSABLE) ×1 IMPLANT
GOWN STRL REUS W/ TWL XL LVL3 (GOWN DISPOSABLE) ×2 IMPLANT
GOWN STRL REUS W/TWL LRG LVL3 (GOWN DISPOSABLE) ×3
GOWN STRL REUS W/TWL XL LVL3 (GOWN DISPOSABLE) ×9
KIT BASIN OR (CUSTOM PROCEDURE TRAY) ×3 IMPLANT
KIT ROOM TURNOVER OR (KITS) ×3 IMPLANT
MANIFOLD NEPTUNE II (INSTRUMENTS) ×3 IMPLANT
NDL 22X1.5 STRL (OR ONLY) (MISCELLANEOUS) ×2 IMPLANT
NEEDLE 22X1 1/2 OR ONLY (MISCELLANEOUS) ×4
NEEDLE 22X1.5 STRL (OR ONLY) (MISCELLANEOUS) ×2 IMPLANT
NS IRRIG 1000ML POUR BTL (IV SOLUTION) ×3 IMPLANT
PACK TOTAL JOINT (CUSTOM PROCEDURE TRAY) ×3 IMPLANT
PAD ARMBOARD 7.5X6 YLW CONV (MISCELLANEOUS) ×6 IMPLANT
SAW OSC TIP CART 19.5X105X1.3 (SAW) ×3 IMPLANT
SPONGE LAP 18X18 X RAY DECT (DISPOSABLE) ×2 IMPLANT
SUT VIC AB 1 CTX 36 (SUTURE) ×3
SUT VIC AB 1 CTX36XBRD ANBCTR (SUTURE) ×1 IMPLANT
SUT VIC AB 2-0 CT1 27 (SUTURE) ×6
SUT VIC AB 2-0 CT1 TAPERPNT 27 (SUTURE) ×2 IMPLANT
SUT VIC AB 3-0 PS2 18 (SUTURE) ×3
SUT VIC AB 3-0 PS2 18XBRD (SUTURE) ×1 IMPLANT
SYR CONTROL 10ML LL (SYRINGE) ×6 IMPLANT
TOWEL OR 17X24 6PK STRL BLUE (TOWEL DISPOSABLE) ×3 IMPLANT
TOWEL OR 17X26 10 PK STRL BLUE (TOWEL DISPOSABLE) ×3 IMPLANT
TRAY FOLEY CATH 14FR (SET/KITS/TRAYS/PACK) IMPLANT

## 2015-11-26 NOTE — Transfer of Care (Signed)
Immediate Anesthesia Transfer of Care Note  Patient: Brandi Chase  Procedure(s) Performed: Procedure(s): TOTAL HIP ARTHROPLASTY ANTERIOR APPROACH (Right)  Patient Location: PACU  Anesthesia Type:General  Level of Consciousness: awake, alert  and oriented  Airway & Oxygen Therapy: Patient Spontanous Breathing and Patient connected to nasal cannula oxygen  Post-op Assessment: Report given to RN and Post -op Vital signs reviewed and stable  Post vital signs: Reviewed and stable  Last Vitals: There were no vitals filed for this visit.  Last Pain: There were no vitals filed for this visit.       Complications: No apparent anesthesia complications

## 2015-11-26 NOTE — Interval H&P Note (Signed)
History and Physical Interval Note:  11/26/2015 12:40 PM  Brandi Chase  has presented today for surgery, with the diagnosis of RIGHT HIP OSTEOARTHRITIS  The various methods of treatment have been discussed with the patient and family. After consideration of risks, benefits and other options for treatment, the patient has consented to  Procedure(s): TOTAL HIP ARTHROPLASTY ANTERIOR APPROACH (Right) as a surgical intervention .  The patient's history has been reviewed, patient examined, no change in status, stable for surgery.  I have reviewed the patient's chart and labs.  Questions were answered to the patient's satisfaction.     Kerin Salen

## 2015-11-26 NOTE — Anesthesia Postprocedure Evaluation (Signed)
Anesthesia Post Note  Patient: [place]  Procedure(s) Performed: Procedure(s) (LRB): TOTAL HIP ARTHROPLASTY ANTERIOR APPROACH (Right)  Patient location during evaluation: PACU Anesthesia Type: General Level of consciousness: awake and alert Pain management: pain level controlled Vital Signs Assessment: post-procedure vital signs reviewed and stable Respiratory status: spontaneous breathing, nonlabored ventilation, respiratory function stable and patient connected to nasal cannula oxygen Cardiovascular status: blood pressure returned to baseline and stable Postop Assessment: no signs of nausea or vomiting Anesthetic complications: no    Last Vitals:  Vitals:   11/26/15 1830 11/26/15 1836  BP:  129/73  Pulse: 86 86  Resp: 16 18  Temp:  36.8 C    Last Pain:  Vitals:   11/26/15 1856  TempSrc:   PainSc: Asleep                 Bradely Rudin A

## 2015-11-26 NOTE — Progress Notes (Signed)
Pt arrived to the unit from pacu; pt A&O x4 but drowsy and sleeping. Pt IV intact and transfusing; RLE incision aquacel dsg remains clean, dry and intact with no stain or active bleeding noted. Pt oriented to the unit and room; remains on 2L oxygen; VSS; RLE neuro check wnl. Pt oriented to the unit and room;  Fall/safety precaution and prevention education completed with pt and family. Pt sleeping comfortably in bed with call light and family at bedside. Will closely monitor and report off to oncoming RN. Delia Heady RN

## 2015-11-26 NOTE — Anesthesia Preprocedure Evaluation (Signed)
Anesthesia Evaluation  Patient identified by MRN, date of birth, ID band Patient awake    Reviewed: Allergy & Precautions, NPO status , Patient's Chart, lab work & pertinent test results  History of Anesthesia Complications (+) PONV  Airway Mallampati: I  TM Distance: >3 FB Neck ROM: Full    Dental  (+) Teeth Intact, Dental Advisory Given   Pulmonary asthma ,    breath sounds clear to auscultation       Cardiovascular hypertension, Pt. on medications  Rhythm:Regular Rate:Normal     Neuro/Psych    GI/Hepatic   Endo/Other    Renal/GU      Musculoskeletal   Abdominal   Peds  Hematology   Anesthesia Other Findings   Reproductive/Obstetrics                             Anesthesia Physical Anesthesia Plan  ASA: III  Anesthesia Plan: General   Post-op Pain Management:    Induction: Intravenous  Airway Management Planned: LMA  Additional Equipment:   Intra-op Plan:   Post-operative Plan: Extubation in OR  Informed Consent: I have reviewed the patients History and Physical, chart, labs and discussed the procedure including the risks, benefits and alternatives for the proposed anesthesia with the patient or authorized representative who has indicated his/her understanding and acceptance.   Dental advisory given  Plan Discussed with: CRNA, Anesthesiologist and Surgeon  Anesthesia Plan Comments:         Anesthesia Quick Evaluation

## 2015-11-26 NOTE — Op Note (Signed)
OPERATIVE REPORT    DATE OF PROCEDURE:  11/26/2015       PREOPERATIVE DIAGNOSIS:  RIGHT HIP OSTEOARTHRITIS                                                          POSTOPERATIVE DIAGNOSIS:  RIGHT HIP OSTEOARTHRITIS                                                           PROCEDURE: Anterior R total hip arthroplasty using a 48 mm DePuy Pinnacle  Cup, [employer], 0-degree polyethylene liner, a +5 32 mm ceramic head, a 0 Depuy Triloc stem   SURGEON: Frederik Pear J    ASSISTANT:   Eric K. [employer]  (present throughout entire procedure and necessary for timely completion of the procedure)   ANESTHESIA: GET BLOOD LOSS: 300 FLUID REPLACEMENT: 1500 crystalloid Antibiotic: 2gm ancef Tranexamic Acid: 1gm iv, 2 gm topical COMPLICATIONS: none    INDICATIONS FOR PROCEDURE: A 50 y.o. year-old With  RIGHT HIP OSTEOARTHRITIS   for 3 years, x-rays show bone-on-bone arthritic changes, and osteophytes. Despite conservative measures with observation, anti-inflammatory medicine, narcotics, use of a cane, has severe unremitting pain and can ambulate only a few blocks before resting. Patient desires elective L total hip arthroplasty to decrease pain and increase function. The risks, benefits, and alternatives were discussed at length including but not limited to the risks of infection, bleeding, nerve injury, stiffness, blood clots, the need for revision surgery, cardiopulmonary complications, among others, and they were willing to proceed. Questions answered     PROCEDURE IN DETAIL: The patient was identified by armband,  received preoperative IV antibiotics in the holding area at [facility], taken to the operating room , appropriate anesthetic monitors  were attached and  anesthesia was induced with the patienton the gurney. The HANA boots were applied to the feet and he was then transferred to the HANA table with a peroneal post and support underneath the non-operative le, which  was locked in 5 lb traction. Theoperative lower extremity was then prepped and draped in the usual sterile fashion from just above the iliac crest to the knee. And a timeout procedure was performed. We then made a 15 cm incision along the interval at the leading edge of the tensor fascia lata of starting at 2 cm lateral to and 2 cm distal to the ASIS. Small bleeders in the skin and subcutaneous tissue identified and cauterized we dissected down to the fascia and made an incision in the fascia allowing [place] to elevate the fascia of the tensor muscle and exploited the interval between the rectus and the tensor fascia lata. A Hohmann retractor was then placed along the superior neck of the femur and a Cobra retractor along the inferior neck of the femur we teed the capsule starting out at the superior anterior aspect of the acetabulum going distally and made the T along the neck both leaflets of the T were tagged with #2 Ethibond suture. Cobra retractors were then placed along the inferior and superior neck allowing [place] to perform a standard neck cut and  removed the femoral head with a power corkscrew. We then placed a right angle Hohmann retractor along the anterior aspect of the acetabulum a spiked Cobra in the cotyloid notch and posteriorly a Muelller retractor. We then sequentially reamed up to a 48 mm basket reamer obtaining good coverage in all quadrants, verified by C-arm imaging. Under C-arm control with and hammered into place a 48 mm Pinnacle cup in 45 of abduction and 15 of anteversion. The cup seated nicely and required no supplemental screws. We then placed a central hole Eliminator and a 0 polyethylene liner. The foot was then externally rotated to 110, the HANA elevator was placed around the flare of the greater trochanter and the limb was extended and abducted delivering the proximal femur up into the wound. A medium Hohmann retractor was placed over the greater trochanter and a Mueller retractor along  the posterior femoral neck completing the exposure. We then performed releases superiorly and and inferiorly of the capsule going back to the pirformis fossa superiorly and to the lesser trochanter inferiorly. We then entered the proximal femur with the box cutting offset chisel followed by, a canal sounder, the chili pepper and broaching up to a 0 broach. This seated nicely and we reamed the calcar. A trial reduction was performed with a +5 mm 32 mm head.The limb lengths were excellent the hip was stable in 90 of external rotation. At this point the trial components removed and we hammered into place a # 0 Tri-Lock stem with Gryption coating. This was a std offset stem and a + 32 mm ceramic ball was then hammered into place the hip was reduced and final C-arm images obtained. The wound was thoroughly irrigated with normal saline solution. We repaired the ant capsule and the tensor fascia lot a with running 0 vicryl suture. the subcutaneous tissue was closed with 2-0 and 3-0 Vicryl suture followed by an Aquacil dressing. At this point the patient was awaken and transferred to hospital gurney without difficulty. The subcutaneous tissue with 0 and 2-0 undyed Vicryl suture and the skin with running  3-0 vicryl subcuticular suture. Aquacil dressing was applied. The patient was then unclamped, rolled supine, awaken extubated and taken to recovery room without difficulty in stable condition.   Frederik Pear J 11/26/2015, 2:22 PM

## 2015-11-26 NOTE — Anesthesia Procedure Notes (Signed)
Procedure Name: Intubation Date/Time: 11/26/2015 12:57 PM Performed by: Eligha Bridegroom Pre-anesthesia Checklist: Patient identified, Emergency Drugs available, Suction available, Patient being monitored and Timeout performed Patient Re-evaluated:Patient Re-evaluated prior to inductionOxygen Delivery Method: Circle system utilized Preoxygenation: Pre-oxygenation with 100% oxygen Intubation Type: IV induction Ventilation: Mask ventilation without difficulty and Oral airway inserted - appropriate to patient size Laryngoscope Size: Mac and 3 Grade View: Grade II Tube type: Oral Number of attempts: 1 Airway Equipment and Method: Stylet Placement Confirmation: ETT inserted through vocal cords under direct vision,  positive ETCO2 and breath sounds checked- equal and bilateral Secured at: 21 cm Tube secured with: Tape Dental Injury: Teeth and Oropharynx as per pre-operative assessment

## 2015-11-26 NOTE — Discharge Instructions (Signed)

## 2015-11-27 ENCOUNTER — Encounter (HOSPITAL_COMMUNITY): Payer: Self-pay | Admitting: Orthopedic Surgery

## 2015-11-27 LAB — CBC
HCT: 30.7 % — ABNORMAL LOW (ref 36.0–46.0)
Hemoglobin: 9.2 g/dL — ABNORMAL LOW (ref 12.0–15.0)
MCH: 25 pg — ABNORMAL LOW (ref 26.0–34.0)
MCHC: 30 g/dL (ref 30.0–36.0)
MCV: 83.4 fL (ref 78.0–100.0)
Platelets: 274 10*3/uL (ref 150–400)
RBC: 3.68 MIL/uL — ABNORMAL LOW (ref 3.87–5.11)
RDW: 20.7 % — ABNORMAL HIGH (ref 11.5–15.5)
WBC: 11.1 10*3/uL — ABNORMAL HIGH (ref 4.0–10.5)

## 2015-11-27 NOTE — Evaluation (Signed)
Physical Therapy Evaluation Patient Details Name: Brandi Chase MRN: [id number] DOB: [birth year] Today's Date: 11/27/2015   History of Present Illness  50 y.o. female now s/p Rt direct anterior THA. PMH: back fusion, hypertension.   Clinical Impression  Pt is s/p direct anterior THA resulting in the deficits listed below (see PT Problem List).  Pt will benefit from skilled PT to increase their independence and safety with mobility to allow discharge to home with family support.      Follow Up Recommendations Home health PT;Supervision for mobility/OOB    Equipment Recommendations  None recommended by PT    Recommendations for Other Services       Precautions / Restrictions Precautions Precautions: Fall Restrictions Weight Bearing Restrictions: Yes RLE Weight Bearing: Weight bearing as tolerated      Mobility  Bed Mobility Overal bed mobility: Needs Assistance Bed Mobility: Sit to Supine       Sit to supine: Min assist   General bed mobility comments: Rt LE  Transfers Overall transfer level: Needs assistance Equipment used: Rolling walker (2 wheeled) Transfers: Sit to/from Stand Sit to Stand: Min guard         General transfer comment: cues for hand position, stable technique  Ambulation/Gait Ambulation/Gait assistance: Min guard Ambulation Distance (Feet): 125 Feet Assistive device: Rolling walker (2 wheeled) Gait Pattern/deviations: Step-through pattern Gait velocity: decreased   General Gait Details: working on even strides and weightbearing through [place]            Wheelchair Mobility    Modified Rankin (Stroke Patients Only)       Balance Overall balance assessment: Needs assistance Sitting-balance support: No upper extremity supported Sitting balance-Leahy Scale: Good     Standing balance support: Bilateral upper extremity supported Standing balance-Leahy Scale: Poor Standing balance comment: using rw                              Pertinent Vitals/Pain Pain Assessment: 0-10 Pain Score: 3  Pain Location: Rt hip Pain Descriptors / Indicators: Aching Pain Intervention(s): Limited activity within patient's tolerance;Monitored during session;Ice applied    Home Living Family/patient expects to be discharged to:: Private residence Living Arrangements: Alone Available Help at Discharge: Family;Available 24 hours/day Type of Home: House Home Access: Stairs to enter Entrance Stairs-Rails: Chemical engineer of Steps: 4 Home Layout: One level Home Equipment: Environmental consultant - 2 wheels;Cane - single point Additional Comments: planning to stay at father's home upon D/C    Prior Function Level of Independence: Independent with assistive device(s)         Comments: using SPC     Hand Dominance        Extremity/Trunk Assessment   Upper Extremity Assessment: Defer to OT evaluation           Lower Extremity Assessment: RLE deficits/detail RLE Deficits / Details: difficulty with Rt hip flexion due to pain, improving with time.        Communication   Communication: No difficulties  Cognition Arousal/Alertness: Awake/alert Behavior During Therapy: WFL for tasks assessed/performed Overall Cognitive Status: Within Functional Limits for tasks assessed                      General Comments      Exercises        Assessment/Plan    PT Assessment Patient needs continued PT services  PT Diagnosis Difficulty walking   PT Problem  List Decreased strength;Decreased range of motion;Decreased activity tolerance;Decreased balance;Decreased mobility  PT Treatment Interventions     PT Goals (Current goals can be found in the Care Plan section) Acute Rehab PT Goals Patient Stated Goal: eventually return to her own home PT Goal Formulation: With patient Time For Goal Achievement: 12/11/15 Potential to Achieve Goals: Good    Frequency 7X/week   Barriers to discharge         Co-evaluation               End of Session Equipment Utilized During Treatment: Gait belt Activity Tolerance: Patient tolerated treatment well Patient left: in bed;with call bell/phone within reach;with SCD's reapplied Nurse Communication: Mobility status         Time: [id number] PT Time Calculation (min) (ACUTE ONLY): 25 min   Charges:   PT Evaluation $PT Eval Moderate Complexity: 1 Procedure PT Treatments $Gait Training: 8-22 mins   PT G Codes:        Cassell Clement, PT, CSCS Pager [phone] Office [phone]  11/27/2015, 11:17 AM

## 2015-11-27 NOTE — Progress Notes (Signed)
Patient ID: Brandi Chase, female   DOB: 24-Jul-1965, 50 y.o.   MRN: [id number] PATIENT ID: Brandi Chase  MRN: [id number]  DOB/AGE:  12/21/1965 / 50 y.o.  1 Day Post-Op Procedure(s) (LRB): TOTAL HIP ARTHROPLASTY ANTERIOR APPROACH (Right)    PROGRESS NOTE Subjective: Patient is alert, oriented, no Nausea, no Vomiting, yes passing gas, . Taking PO well. Denies SOB, Chest or Calf Pain. Using Incentive Spirometer, PAS in place. Ambulate WBAT Patient reports pain as  3/10  .    Objective: Vital signs in last 24 hours: Vitals:   11/26/15 1836 11/26/15 2044 11/27/15 0035 11/27/15 0537  BP: 129/73 112/60 111/61 (!) 111/56  Pulse: 86 87 85 96  Resp: 18 18 16 16   Temp: 98.2 F (36.8 C) 98.9 F (37.2 C) 99.9 F (37.7 C) 99.3 F (37.4 C)  TempSrc: Oral Oral Oral Oral  SpO2: 100% 100% 100% 100%  Weight:      Height:          Intake/Output from previous day: I/O last 3 completed shifts: In: [id number] [P.O.:240; I.V.:3800] Out: 300 [Urine:300]   Intake/Output this shift: Total I/O In: 260 [P.O.:260] Out: -    LABORATORY DATA:  Recent Labs  11/27/15 0530  WBC 11.1*  HGB 9.2*  HCT 30.7*  PLT 274    Examination: Neurologically intact ABD soft Neurovascular intact Sensation intact distally Intact pulses distally Dorsiflexion/Plantar flexion intact Incision: dressing C/D/I No cellulitis present Compartment soft} XR AP&Lat of hip shows well placed\fixed THA  Assessment:   1 Day Post-Op Procedure(s) (LRB): TOTAL HIP ARTHROPLASTY ANTERIOR APPROACH (Right) ADDITIONAL DIAGNOSIS:  Expected Acute Blood Loss Anemia, Hypertension, obesity  Plan: PT/OT WBAT, THA  DVT Prophylaxis: SCDx72 hrs, ASA 325 mg BID x 2 weeks  DISCHARGE PLAN: Home  DISCHARGE NEEDS: HHPT, Walker and 3-in-1 comode seat

## 2015-11-27 NOTE — Progress Notes (Signed)
Physical Therapy Treatment Patient Details Name: Brandi Chase MRN: [id number] DOB: 02-16-1966 Today's Date: 11/27/2015    History of Present Illness 50 y.o. female now s/p Rt direct anterior THA. PMH: back fusion, hypertension.     PT Comments    Pt progressing with mobility, able to ambulate 150 ft with rw during second PT session. Anticipate pt will D/C to home with family support when released. PT to follow and maximize independence prior to D/C. Will need to attempt steps prior to d/c.  Follow Up Recommendations  Home health PT;Supervision for mobility/OOB     Equipment Recommendations  None recommended by PT    Recommendations for Other Services       Precautions / Restrictions Precautions Precautions: Fall Restrictions Weight Bearing Restrictions: Yes RLE Weight Bearing: Weight bearing as tolerated    Mobility  Bed Mobility               General bed mobility comments: in chair upon arrival  Transfers Overall transfer level: Needs assistance Equipment used: Rolling walker (2 wheeled) Transfers: Sit to/from Stand Sit to Stand: Min guard         General transfer comment: no cues needed, reports hip feels stiff and sore upon standing  Ambulation/Gait Ambulation/Gait assistance: Min guard;Supervision Ambulation Distance (Feet): 150 Feet Assistive device: Rolling walker (2 wheeled) Gait Pattern/deviations: Step-through pattern Gait velocity: decreased   General Gait Details: initially having difficulty advancing Rt LE with swing phase, improving as distance increased. Cues for even strides and even weightbearing.    Stairs            Wheelchair Mobility    Modified Rankin (Stroke Patients Only)       Balance Overall balance assessment: Needs assistance Sitting-balance support: No upper extremity supported Sitting balance-Leahy Scale: Good     Standing balance support: During functional activity Standing balance-Leahy Scale:  Fair Standing balance comment: able to stand static without UE support                    Cognition Arousal/Alertness: Awake/alert Behavior During Therapy: WFL for tasks assessed/performed Overall Cognitive Status: Within Functional Limits for tasks assessed                      Exercises      General Comments        Pertinent Vitals/Pain Pain Assessment: 0-10 Pain Score: 3  Pain Location: Rt hip Pain Descriptors / Indicators: Sore Pain Intervention(s): Limited activity within patient's tolerance;Monitored during session    Home Living                      Prior Function            PT Goals (current goals can now be found in the care plan section) Acute Rehab PT Goals Patient Stated Goal: move with less pain PT Goal Formulation: With patient Time For Goal Achievement: 12/11/15 Potential to Achieve Goals: Good Progress towards PT goals: Progressing toward goals    Frequency  7X/week    PT Plan Current plan remains appropriate    Co-evaluation             End of Session Equipment Utilized During Treatment: Gait belt Activity Tolerance: Patient tolerated treatment well Patient left: Other (comment) (pt left in care of OT)     Time: [id number] PT Time Calculation (min) (ACUTE ONLY): 15 min  Charges:  $Gait Training: 8-22 mins  G Codes:      Cassell Clement, PT, CSCS Pager [phone] Office [phone]  11/27/2015, 3:55 PM

## 2015-11-27 NOTE — Evaluation (Signed)
Occupational Therapy Evaluation Patient Details Name: Brandi Chase MRN: [id number] DOB: [birth year] Today's Date: 11/27/2015    History of Present Illness 50 y.o. female now s/p Rt direct anterior THA. PMH: back fusion, hypertension.    Clinical Impression   Pt is a pleasant 50 y/o female who PTA was living alone and independent in ADL and IADL while ambulating with a SPC. Please see deficit list below in decrease in independence post-op. Pt shows good potential and willingness to achieve goals. Pt able to perform grooming standing at sink and toilet transfer/peri care. Pt able to reach feet during simulation of LB dressing/bathing. Pt will have family support 24 hours upon discharge. Pt will benefit from skilled OT intervention in the acute care setting to maximize safety and independence during ADL. Next session to focus on tub transfer and education on how to set up 3 in 1 as shower seat.     Follow Up Recommendations  No OT follow up;Supervision - Intermittent    Equipment Recommendations  None recommended by OT    Recommendations for Other Services       Precautions / Restrictions Precautions Precautions: Fall Restrictions Weight Bearing Restrictions: Yes RLE Weight Bearing: Weight bearing as tolerated      Mobility Bed Mobility               General bed mobility comments: in chair upon arrival  Transfers Overall transfer level: Needs assistance Equipment used: Rolling walker (2 wheeled) Transfers: Sit to/from Stand Sit to Stand: Min guard         General transfer comment: no cues needed, reports hip feels stiff and sore upon standing    Balance Overall balance assessment: Needs assistance Sitting-balance support: No upper extremity supported Sitting balance-Leahy Scale: Good     Standing balance support: During functional activity Standing balance-Leahy Scale: Fair Standing balance comment: able to stand static without UE support                             ADL Overall ADL's : Needs assistance/impaired Eating/Feeding: Set up;Sitting   Grooming: Wash/dry hands;Brushing hair;Supervision/safety;Standing Grooming Details (indicate cue type and reason): at sink     Lower Body Bathing: Minimal assistance;With caregiver independent assisting;Sit to/from stand;With adaptive equipment Lower Body Bathing Details (indicate cue type and reason): Pt educated in long handle sponge Upper Body Dressing : Modified independent   Lower Body Dressing: Minimal assistance;With caregiver independent assisting;Sit to/from stand Lower Body Dressing Details (indicate cue type and reason): Can reach both feet for LB dressing Toilet Transfer: Min guard;Ambulation;BSC;RW Toilet Transfer Details (indicate cue type and reason): 3 in 1 over toilet Toileting- Clothing Manipulation and Hygiene: Supervision/safety;Sit to/from stand       Functional mobility during ADLs: Herbalist     Praxis      Pertinent Vitals/Pain Pain Assessment: 0-10 Pain Score: 3  Pain Location: Rt hip Pain Descriptors / Indicators: Sore Pain Intervention(s): Limited activity within patient's tolerance;Monitored during session     Hand Dominance Right   Extremity/Trunk Assessment Upper Extremity Assessment Upper Extremity Assessment: Overall WFL for tasks assessed   Lower Extremity Assessment Lower Extremity Assessment: Defer to PT evaluation       Communication Communication Communication: No difficulties   Cognition Arousal/Alertness: Awake/alert Behavior During Therapy: WFL for tasks assessed/performed Overall Cognitive Status: Within Functional Limits for tasks assessed  General Comments       Exercises       Shoulder Instructions      Home Living Family/patient expects to be discharged to:: Private residence Living Arrangements: Alone Available Help at Discharge:  Family;Available 24 hours/day Type of Home: House Home Access: Stairs to enter [employer] of Steps: 4 Entrance Stairs-Rails: Left;Right Home Layout: One level     Bathroom Shower/Tub: Tub/shower unit Shower/tub characteristics: Architectural technologist: Standard Bathroom Accessibility: Yes How Accessible: Accessible via walker Home Equipment: [place] - 2 wheels;Bedside commode;Cane - single point   Additional Comments: planning to stay at father's home upon D/C      Prior Functioning/Environment Level of Independence: Independent with assistive device(s)        Comments: using SPC    OT Diagnosis: Acute pain   OT Problem List: Decreased range of motion;Decreased knowledge of use of DME or AE;Pain   OT Treatment/Interventions: Self-care/ADL training;Therapeutic exercise;DME and/or AE instruction;Patient/family education    OT Goals(Current goals can be found in the care plan section) Acute Rehab OT Goals Patient Stated Goal: move with less pain OT Goal Formulation: With patient/family Time For Goal Achievement: 12/04/15 Potential to Achieve Goals: Good ADL Goals Pt Will Perform Grooming: with modified independence;standing Pt Will Perform Lower Body Dressing: with modified independence;sit to/from stand Pt Will Perform Tub/Shower Transfer: Tub transfer;with modified independence;3 in 1;with caregiver independent in assisting  OT Frequency: Min 2X/week   Barriers to D/C:            Co-evaluation              End of Session Equipment Utilized During Treatment: Gait belt;Rolling walker Nurse Communication: Mobility status  Activity Tolerance: Patient tolerated treatment well Patient left: in chair;with call bell/phone within reach;with family/visitor present   Time: [id number] OT Time Calculation (min): 17 min Charges:  OT General Charges $OT Visit: 1 Procedure OT Evaluation $OT Eval Low Complexity: 1 Procedure G-Codes:    Hulda Humphrey  OTR/L [phone]

## 2015-11-28 LAB — CBC
HCT: 26.9 % — ABNORMAL LOW (ref 36.0–46.0)
Hemoglobin: 8.1 g/dL — ABNORMAL LOW (ref 12.0–15.0)
MCH: 25.3 pg — ABNORMAL LOW (ref 26.0–34.0)
MCHC: 30.1 g/dL (ref 30.0–36.0)
MCV: 84.1 fL (ref 78.0–100.0)
PLATELETS: 274 10*3/uL (ref 150–400)
RBC: 3.2 MIL/uL — AB (ref 3.87–5.11)
RDW: 20.5 % — ABNORMAL HIGH (ref 11.5–15.5)
WBC: 14.2 10*3/uL — AB (ref 4.0–10.5)

## 2015-11-28 NOTE — Progress Notes (Signed)
PATIENT ID: Brandi Chase  MRN: [id number]  DOB/AGE:  12-23-1965 / 50 y.o.  2 Days Post-Op Procedure(s) (LRB): TOTAL HIP ARTHROPLASTY ANTERIOR APPROACH (Right)    PROGRESS NOTE Subjective: Patient is alert, oriented, no Nausea, no Vomiting, yes passing gas, . Taking PO well. Denies SOB, Chest or Calf Pain. Using Incentive Spirometer, PAS in place. Ambulate WBAT with pt walking 150 ft with therapy Patient reports pain as  Mild to moderate  .    Objective: Vital signs in last 24 hours: Vitals:   11/27/15 0849 11/27/15 1325 11/27/15 2048 11/28/15 0641  BP: 109/61 (!) 94/56 (!) 116/49 (!) 107/51  Pulse: 93 88 92 84  Resp: 18 18 18 16   Temp: 99.6 F (37.6 C) 98.1 F (36.7 C) 99.2 F (37.3 C) 98.7 F (37.1 C)  TempSrc: Oral Oral Oral Oral  SpO2: 98% 99% 98% 97%  Weight:      Height:          Intake/Output from previous day: I/O last 3 completed shifts: In: 4421.3 [P.O.:1290; I.V.:3131.3] Out: 300 [Urine:300]   Intake/Output this shift: No intake/output data recorded.   LABORATORY DATA:  Recent Labs  11/27/15 0530 11/28/15 0218  WBC 11.1* 14.2*  HGB 9.2* 8.1*  HCT 30.7* 26.9*  PLT 274 274    Examination: Neurologically intact Neurovascular intact Sensation intact distally Intact pulses distally Dorsiflexion/Plantar flexion intact Incision: dressing C/D/I and no drainage No cellulitis present Compartment soft} XR AP&Lat of hip shows well placed\fixed THA  Assessment:   2 Days Post-Op Procedure(s) (LRB): TOTAL HIP ARTHROPLASTY ANTERIOR APPROACH (Right) ADDITIONAL DIAGNOSIS:  Expected Acute Blood Loss Anemia, Hypertension  Plan: PT/OT WBAT, THA  DVT Prophylaxis: SCDx72 hrs, ASA 325 mg BID x 2 weeks  DISCHARGE PLAN: Home  DISCHARGE NEEDS: HHPT, Walker and 3-in-1 comode seat

## 2015-11-28 NOTE — Progress Notes (Signed)
Physical Therapy Treatment Patient Details Name: Brandi Chase MRN: [id number] DOB: Jun 20, 1965 Today's Date: 11/28/2015    History of Present Illness 50 y.o. female now s/p Rt direct anterior THA. PMH: back fusion, hypertension.     PT Comments    Pt progressing well and MOD I with transfers and gait training.  PTA reviewed standing exercises for clarity and correct technique.  Will f/u in am if patient remains hospitalized.    Follow Up Recommendations  Home health PT;Supervision for mobility/OOB     Equipment Recommendations  None recommended by PT    Recommendations for Other Services       Precautions / Restrictions Precautions Precautions: Fall Restrictions Weight Bearing Restrictions: Yes RLE Weight Bearing: Weight bearing as tolerated    Mobility  Bed Mobility Overal bed mobility: Needs Assistance Bed Mobility: Supine to Sit     Supine to sit: Supervision     General bed mobility comments: Pt sitting edge of bed on arrival  Transfers Overall transfer level: Modified independent Equipment used: Rolling walker (2 wheeled) Transfers: Sit to/from Stand Sit to Stand: Modified independent (Device/Increase time)         General transfer comment: Good technique  Ambulation/Gait Ambulation/Gait assistance: Modified independent (Device/Increase time) Ambulation Distance (Feet): 400 Feet Assistive device: Rolling walker (2 wheeled) Gait Pattern/deviations: Step-through pattern     General Gait Details: Good technique   Stairs            Wheelchair Mobility    Modified Rankin (Stroke Patients Only)       Balance Overall balance assessment: Needs assistance Sitting-balance support: No upper extremity supported Sitting balance-Leahy Scale: Normal     Standing balance support: During functional activity;Bilateral upper extremity supported Standing balance-Leahy Scale: Good                      Cognition Arousal/Alertness:  Awake/alert Behavior During Therapy: WFL for tasks assessed/performed Overall Cognitive Status: Within Functional Limits for tasks assessed                      Exercises Total Joint Exercises Hip ABduction/ADduction: AROM;Right;10 reps;Standing Knee Flexion: AROM;Right;10 reps;Standing Marching in Standing: AROM;Right;10 reps;Standing Standing Hip Extension: AROM;Right;Standing    General Comments        Pertinent Vitals/Pain Pain Assessment: 0-10 Pain Score: 4  Pain Location: R hip Pain Descriptors / Indicators: Aching;Sore Pain Intervention(s): Monitored during session    Home Living                      Prior Function            PT Goals (current goals can now be found in the care plan section) Acute Rehab PT Goals Patient Stated Goal: move with less pain Potential to Achieve Goals: Good Progress towards PT goals: Progressing toward goals    Frequency  7X/week    PT Plan Current plan remains appropriate    Co-evaluation             End of Session Equipment Utilized During Treatment: Gait belt Activity Tolerance: Patient tolerated treatment well       Time: 1520-1540 PT Time Calculation (min) (ACUTE ONLY): 20 min  Charges:  $Gait Training: 8-22 mins                    G Codes:      Florestine Aversimee J Nayden Czajka 11/28/2015, 3:53 PM  Joycelyn RuaAimee Devian Bartolomei, PTA pager  [phone]  

## 2015-11-28 NOTE — Progress Notes (Signed)
Physical Therapy Treatment Patient Details Name: Brandi Chase MRN: [id number] DOB: 1965-07-21 Today's Date: 11/28/2015    History of Present Illness 50 y.o. female now s/p Rt direct anterior THA. PMH: back fusion, hypertension.     PT Comments    Pt performed stair training and completed HEP in standing and supine position.    Follow Up Recommendations  Home health PT;Supervision for mobility/OOB     Equipment Recommendations  None recommended by PT    Recommendations for Other Services       Precautions / Restrictions Precautions Precautions: Fall Restrictions Weight Bearing Restrictions: Yes RLE Weight Bearing: Weight bearing as tolerated    Mobility  Bed Mobility Overal bed mobility: Needs Assistance Bed Mobility: Supine to Sit     Supine to sit: Supervision Sit to supine: Min assist   General bed mobility comments: Pt required assist to lift LLE into bed.    Transfers Overall transfer level: Needs assistance Equipment used: Rolling walker (2 wheeled) Transfers: Sit to/from Stand Sit to Stand: Supervision         General transfer comment: Cues for hand placement to and from fro safety.    Ambulation/Gait Ambulation/Gait assistance: Supervision;Min guard   Assistive device: Rolling walker (2 wheeled) Gait Pattern/deviations: Step-through pattern;Antalgic;Trunk flexed Gait velocity: decreased   General Gait Details: Cues for upper trunk control and increased stride length of R LEs.     Stairs Stairs: Yes Stairs assistance: Min guard Stair Management: Step to pattern Number of Stairs: 4 General stair comments: Cues for sequencing and hand placement on rails.    Wheelchair Mobility    Modified Rankin (Stroke Patients Only)       Balance Overall balance assessment: Needs assistance Sitting-balance support: No upper extremity supported Sitting balance-Leahy Scale: Good       Standing balance-Leahy Scale: Fair                      Cognition Arousal/Alertness: Awake/alert Behavior During Therapy: WFL for tasks assessed/performed Overall Cognitive Status: Within Functional Limits for tasks assessed                      Exercises Total Joint Exercises Ankle Circles/Pumps: AROM;Both;10 reps;Supine Quad Sets: AROM;Both;10 reps;Supine Short Arc Quad: AROM;Left;10 reps;Supine Heel Slides: AROM;Left;10 reps;Supine Hip ABduction/ADduction: AROM;Left;Supine;Standing;20 reps Long Arc Quad: AROM;Left;10 reps;Supine Knee Flexion: AROM;Left;10 reps;Standing Marching in Standing: AROM;Left;10 reps;Standing Standing Hip Extension: AROM;Left;10 reps;Standing    General Comments        Pertinent Vitals/Pain Pain Assessment: 0-10 Pain Score: 3  Pain Location: R hip   Pain Descriptors / Indicators: Sore Pain Intervention(s): Limited activity within patient's tolerance;Monitored during session    Home Living                      Prior Function            PT Goals (current goals can now be found in the care plan section) Acute Rehab PT Goals Patient Stated Goal: move with less pain Potential to Achieve Goals: Good Progress towards PT goals: Progressing toward goals    Frequency  7X/week    PT Plan Current plan remains appropriate    Co-evaluation             End of Session Equipment Utilized During Treatment: Gait belt Activity Tolerance: Patient tolerated treatment well       Time: [id number] PT Time Calculation (min) (ACUTE ONLY): 30 min  Charges:  $  Gait Training: 8-22 mins $Therapeutic Exercise: 8-22 mins                    G Codes:      Cristela Blue 12-02-2015, 9:52 AM  Governor Rooks, PTA pager [phone]

## 2015-11-28 NOTE — Discharge Summary (Signed)
Patient ID: Brandi Chase MRN: [id number] DOB/AGE: [birth year] 50 y.o.  Admit date: 11/26/2015 Discharge date: 11/28/2015  Admission Diagnoses:  Active Problems:   Primary localized osteoarthritis of right hip   Localized osteoarthrosis of right hip   Discharge Diagnoses:  Same  Past Medical History:  Diagnosis Date  . Adnexal cyst 2011   removed  . Anemia   . Arthritis    "neck; spine" (08/29/2014)  . Asthma   . Carpal tunnel syndrome of left wrist   . Chronic lower back pain   . Complication of anesthesia    difficult to awake  . Daily headache 12/2013   "tx'd and no problems anymore" (08/29/2014)  . Family history of adverse reaction to anesthesia    "1 sister and both parents hard to wake up"  . Heart murmur    "dx'd when I was a teenager; haven't had any problems w/it"  . Hip pain, right   . History of bronchitis   . Hypertension   . PONV (postoperative nausea and vomiting)   . Urinary incontinence   . Urinary urgency   . UTI (lower urinary tract infection)     Surgeries: Procedure(s): TOTAL HIP ARTHROPLASTY ANTERIOR APPROACH on 11/26/2015   Consultants:   Discharged Condition: Improved  Hospital Course: Brandi Chase is an 50 y.o. female who was admitted 11/26/2015 for operative treatment ofPrimary osteoarthritis of right hip. Patient has severe unremitting pain that affects sleep, daily activities, and work/hobbies. After pre-op clearance the patient was taken to the operating room on 11/26/2015 and underwent  Procedure(s): TOTAL HIP ARTHROPLASTY ANTERIOR APPROACH.    Patient was given perioperative antibiotics: Anti-infectives    Start     Dose/Rate Route Frequency Ordered Stop   11/26/15 1230  ceFAZolin (ANCEF) IVPB 2g/100 mL premix     2 g 200 mL/hr over 30 Minutes Intravenous To ShortStay Surgical 11/25/15 0940 11/26/15 1300       Patient was given sequential compression devices, early ambulation, and chemoprophylaxis to prevent  DVT.  Patient benefited maximally from hospital stay and there were no complications.    Recent vital signs: Patient Vitals for the past 24 hrs:  BP Temp Temp src Pulse Resp SpO2  11/28/15 0641 (!) 107/51 98.7 F (37.1 C) Oral 84 16 97 %  11/27/15 2048 (!) 116/49 99.2 F (37.3 C) Oral 92 18 98 %  11/27/15 1325 (!) 94/56 98.1 F (36.7 C) Oral 88 18 99 %     Recent laboratory studies:  Recent Labs  11/27/15 0530 11/28/15 0218  WBC 11.1* 14.2*  HGB 9.2* 8.1*  HCT 30.7* 26.9*  PLT 274 274     Discharge Medications:     Medication List    TAKE these medications   albuterol 108 (90 Base) MCG/ACT inhaler Commonly known as:  PROVENTIL HFA;VENTOLIN HFA Inhale 2 puffs into the lungs every 6 (six) hours as needed for wheezing or shortness of breath.   aspirin EC 325 MG tablet Take 1 tablet (325 mg total) by mouth 2 (two) times daily.   ferrous sulfate 325 (65 FE) MG tablet Take 1 tablet (325 mg total) by mouth 3 (three) times daily with meals.   hydrochlorothiazide 12.5 MG capsule Commonly known as:  MICROZIDE Take 1 capsule (12.5 mg total) by mouth daily.   ibuprofen 600 MG tablet Commonly known as:  ADVIL,MOTRIN Take 1 tablet (600 mg total) by mouth daily as needed for mild pain or moderate pain.   magnesium oxide 400 MG  tablet Commonly known as:  MAG-OX Take 400 mg by mouth every morning.   megestrol 20 MG tablet Commonly known as:  MEGACE Take 2 tablets (40 mg total) by mouth daily. Start with 2 tabs every 8 hrs x 2 days, then 2 tabs every 12 hrs, then two tabs daily What changed:  additional instructions   methocarbamol 500 MG tablet Commonly known as:  ROBAXIN Take 1 tablet (500 mg total) by mouth every 6 (six) hours as needed for muscle spasms. What changed:  when to take this  reasons to take this   oxyCODONE-acetaminophen 5-325 MG tablet Commonly known as:  ROXICET Take 1 tablet by mouth every 4 (four) hours as needed.   potassium chloride SA 20  MEQ tablet Commonly known as:  K-DUR,KLOR-CON Take 1 tablet (20 mEq total) by mouth daily.   traMADol 50 MG tablet Commonly known as:  ULTRAM Take 1 tablet (50 mg total) by mouth daily as needed.   VITAMIN C PO Take 1 tablet by mouth daily.       Diagnostic Studies: Dg Chest 2 View  Result Date: 11/17/2015 CLINICAL DATA:  Preoperative evaluation for hip surgery, history hypertension, asthma EXAM: CHEST  2 VIEW COMPARISON:  05/04/2013 FINDINGS: Normal heart size, mediastinal contours, and pulmonary vascularity. Lungs clear. No pleural effusion or pneumothorax. Minimal levoconvex thoracic scoliosis. Prior lumbar spine surgery. IMPRESSION: No acute abnormalities. Electronically Signed   By: Lavonia Dana M.D.   On: 11/17/2015 16:45   Dg C-arm 1-60 Min  Result Date: 11/26/2015 CLINICAL DATA:  Post right hip replacement EXAM: DG C-ARM 61-120 MIN; OPERATIVE RIGHT HIP WITH PELVIS COMPARISON:  03/08/2015 FINDINGS: Two views of the right hip submitted. There is right hip prosthesis with anatomic alignment. Stable mild degenerative changes left hip joint. Mild degenerative changes pubic symphysis. IMPRESSION: Right hip prosthesis with anatomic alignment. Stable mild degenerative changes left hip joint. Fluoroscopy time was 19 seconds. Please see the operative report. Electronically Signed   By: Lahoma Crocker M.D.   On: 11/26/2015 14:32   Dg Hip Operative Unilat W Or W/o Pelvis Right  Result Date: 11/26/2015 CLINICAL DATA:  Post right hip replacement EXAM: DG C-ARM 61-120 MIN; OPERATIVE RIGHT HIP WITH PELVIS COMPARISON:  03/08/2015 FINDINGS: Two views of the right hip submitted. There is right hip prosthesis with anatomic alignment. Stable mild degenerative changes left hip joint. Mild degenerative changes pubic symphysis. IMPRESSION: Right hip prosthesis with anatomic alignment. Stable mild degenerative changes left hip joint. Fluoroscopy time was 19 seconds. Please see the operative report. Electronically  Signed   By: Lahoma Crocker M.D.   On: 11/26/2015 14:32    Disposition: 01-Home or Self Care  Discharge Instructions    Call MD / Call 911    Complete by:  As directed   If you experience chest pain or shortness of breath, CALL 911 and be transported to the hospital emergency room.  If you develope a fever above 101 F, pus (white drainage) or increased drainage or redness at the wound, or calf pain, call your surgeon's office.   Constipation Prevention    Complete by:  As directed   Drink plenty of fluids.  Prune juice may be helpful.  You may use a stool softener, such as Colace (over the counter) 100 mg twice a day.  Use MiraLax (over the counter) for constipation as needed.   Diet - low sodium heart healthy    Complete by:  As directed   Driving restrictions  Complete by:  As directed   No driving for 2 weeks   Follow the hip precautions as taught in Physical Therapy    Complete by:  As directed   Increase activity slowly as tolerated    Complete by:  As directed   Patient may shower    Complete by:  As directed   You may shower without a dressing once there is no drainage.  Do not wash over the wound.  If drainage remains, cover wound with plastic wrap and then shower.      Follow-up Information    Kerin Salen, MD Follow up in 2 week(s).   Specialty:  Orthopedic Surgery Contact information: [place] [address] [phone]            Signed: Hardin Negus ERIC R 11/28/2015, 10:16 AM

## 2015-11-28 NOTE — Plan of Care (Signed)
Problem: Physical Regulation: Goal: Will remain free from infection No signs of infection noted  Problem: Bowel/Gastric: Goal: Will not experience complications related to bowel motility Outcome: Progressing No bowel issues reported

## 2015-11-28 NOTE — Care Management Note (Addendum)
Case Management Note  Patient Details  Name: Brandi Chase MRN: [id number] Date of Birth: March 16, 1966  Subjective/Objective:     status post right total hip arthroplasty 11/26/2015                  Action/Plan: Discharge Planning: AVS reviewed:  Home Health arranged with [facility]. Pt states she has RW and 3n1 at home. Requesting hospital bed for home. Contacted attending, Joanell Rising PA for order for hospital bed. Contacted [facility] DME rep for hospital bed for delivery to home.   PCP- HOFFMAN, JESSICA RATLIFF               11/28/2015 NCM spoke to pt. Provided [facility] with address of delivery [address] [place] [address]. Dtr # [id number]. [facility] will try to arrange deliver of hospital bed this evening,    Expected Discharge Date:  11/28/2015               Expected Discharge Plan: [place] Referral:  NA  Discharge planning Services  CM Consult  Post Acute Care Choice:  Home Health Choice offered to:  Patient  DME Arranged:  Hospital bed DME Agency:  [place]:  RN, PT, OT, Social Work [facility] Agency:  [place]  Status of Service:  Completed, signed off  If discussed at [employer], dates discussed:    Additional Comments:  Erenest Rasher, RN 11/28/2015, 3:57 PM

## 2015-11-28 NOTE — Progress Notes (Signed)
Occupational Therapy Treatment Patient Details Name: Brandi Chase MRN: [id number] DOB: October 03, 1965 Today's Date: 11/28/2015    History of present illness 50 y.o. female now s/p Rt direct anterior THA. PMH: back fusion, hypertension.    OT comments  Focus of session was tub transfer and education of Pt/family on placement of 3 in 1 in shower for safety and increased independence in ADL. Pt able to complete transfer, and her and sister (caregiver) feel confident going home. No further questions from Pt or family. Pt at adequate ability and safety level for discharge from OT perspective.   Follow Up Recommendations  No OT follow up;Supervision - Intermittent    Equipment Recommendations  None recommended by OT    Recommendations for Other Services      Precautions / Restrictions Precautions Precautions: Fall Restrictions Weight Bearing Restrictions: Yes RLE Weight Bearing: Weight bearing as tolerated       Mobility Bed Mobility Overal bed mobility: Needs Assistance Bed Mobility: Supine to Sit     Supine to sit: Supervision        Transfers Overall transfer level: Needs assistance Equipment used: Rolling walker (2 wheeled) Transfers: Sit to/from Stand Sit to Stand: Supervision         General transfer comment: Pt demonstrating safe hand placement for transfers    Balance Overall balance assessment: Needs assistance Sitting-balance support: No upper extremity supported Sitting balance-Leahy Scale: Good     Standing balance support: During functional activity;Bilateral upper extremity supported Standing balance-Leahy Scale: Fair                     ADL Overall ADL's : Needs assistance/impaired     Grooming: Wash/dry hands;Supervision/safety;Standing Grooming Details (indicate cue type and reason): at sink Upper Body Bathing: Supervision/ safety;Sitting Upper Body Bathing Details (indicate cue type and reason): simulated in gym tub Lower Body  Bathing: Minimal assistance;With caregiver independent assisting;Sit to/from stand;With adaptive equipment Lower Body Bathing Details (indicate cue type and reason): simulated in gym tub with 3 in 1         Toilet Transfer: Min guard;Ambulation;RW;BSC Toilet Transfer Details (indicate cue type and reason): 3 in 1 over toilet Toileting- Clothing Manipulation and Hygiene: Supervision/safety;Sit to/from stand   Tub/ Shower Transfer: Tub transfer;Min guard;Cueing for sequencing;Anterior/posterior;3 in Mudlogger Details (indicate cue type and reason): requested she have caregiver assistance for tub transfer Functional mobility during ADLs: Min guard;Rolling walker        Vision                     Perception     Praxis      Cognition   Behavior During Therapy: WFL for tasks assessed/performed Overall Cognitive Status: Within Functional Limits for tasks assessed                       Extremity/Trunk Assessment               Exercises     Shoulder Instructions       General Comments      Pertinent Vitals/ Pain       Pain Assessment: 0-10 Pain Score: 5  Pain Location: R hip Pain Descriptors / Indicators: Aching;Sore Pain Intervention(s): Monitored during session;Repositioned  Home Living  Prior Functioning/Environment              Frequency Min 2X/week     Progress Toward Goals  OT Goals(current goals can now be found in the care plan section)  Progress towards OT goals: Progressing toward goals  Acute Rehab OT Goals Patient Stated Goal: move with less pain OT Goal Formulation: With patient/family Time For Goal Achievement: 12/04/15 Potential to Achieve Goals: Good ADL Goals Pt Will Perform Grooming: with modified independence;standing Pt Will Perform Lower Body Dressing: with modified independence;sit to/from stand Pt Will Perform Tub/Shower  Transfer: Tub transfer;with modified independence;3 in 1;with caregiver independent in assisting  Plan Discharge plan remains appropriate    Co-evaluation                 End of Session Equipment Utilized During Treatment: Gait belt;Rolling walker   Activity Tolerance Patient tolerated treatment well   Patient Left in chair;with call bell/phone within reach;with family/visitor present   Nurse Communication Mobility status        Time: [id number] OT Time Calculation (min): 24 min  Charges: OT General Charges $OT Visit: 1 Procedure OT Treatments $Self Care/Home Management : 8-22 mins $Therapeutic Activity: 8-22 mins  Brandi Chase 11/28/2015, 2:21 PM   Brandi Chase OTR/L [phone]

## 2015-11-28 NOTE — Progress Notes (Addendum)
Chronic Pain  Patient suffers from chronic back pain, hip pain which is caused by status post right total hip arthroplasty 11/26/2015, and Transforaminal Lumbar Interbody Fusion 2016_. Hospital bed will alleviate and lessen pain by allowing back, hip to be positioned in ways not feasible with a normal bed. Pain episodes may require frequent and immediate changes in body position which cannot be achieved with a normal bed.  Jonnie Finner RN CCM Case Mgmt phone [phone]

## 2015-12-18 ENCOUNTER — Other Ambulatory Visit: Payer: Self-pay | Admitting: Internal Medicine

## 2015-12-18 NOTE — Telephone Encounter (Signed)
I think this refill request is meant for [employer]

## 2015-12-18 NOTE — Telephone Encounter (Signed)
Requesting hydrochlorothiazide and tramadol to be filled.

## 2015-12-19 ENCOUNTER — Other Ambulatory Visit: Payer: Self-pay | Admitting: *Deleted

## 2015-12-19 ENCOUNTER — Telehealth: Payer: Self-pay

## 2015-12-19 DIAGNOSIS — M150 Primary generalized (osteo)arthritis: Principal | ICD-10-CM

## 2015-12-19 DIAGNOSIS — M159 Polyosteoarthritis, unspecified: Secondary | ICD-10-CM

## 2015-12-19 NOTE — Telephone Encounter (Signed)
Hopefully will have more details prior to end of day. Given the [facility] is closing, if Chest pain is concerning, would advise Urgent Care evaluation.

## 2015-12-19 NOTE — Telephone Encounter (Signed)
Herbert Deaner, PT - states pt experiences CP when she lies down on her left side and also on her back after several hour after falling asleep but goes away when she sits up. Called pt to get more details about CP episodes - no answer; left message to call back.

## 2015-12-19 NOTE — Telephone Encounter (Signed)
Jim from [facility] requesting the nurse to call back.

## 2015-12-19 NOTE — Telephone Encounter (Signed)
Informed pt to go to [facility] to be evaluaed per Dr Daryll Drown. Pt state ok.

## 2015-12-19 NOTE — Telephone Encounter (Signed)
RTC Jim ,PT [facility] - no answer; left message.

## 2015-12-23 MED ORDER — TRAMADOL HCL 50 MG PO TABS: 50.0000 mg | ORAL_TABLET | Freq: Every day | ORAL | 0 refills | Status: DC | PRN

## 2015-12-23 NOTE — Telephone Encounter (Signed)
Tramadol rx called to [facility]. 

## 2015-12-24 ENCOUNTER — Other Ambulatory Visit: Payer: Self-pay | Admitting: Internal Medicine

## 2016-01-14 ENCOUNTER — Other Ambulatory Visit: Payer: Self-pay | Admitting: Internal Medicine

## 2016-01-14 DIAGNOSIS — M159 Polyosteoarthritis, unspecified: Secondary | ICD-10-CM

## 2016-01-14 DIAGNOSIS — M150 Primary generalized (osteo)arthritis: Principal | ICD-10-CM

## 2016-01-15 MED ORDER — TRAMADOL HCL 50 MG PO TABS: 50.0000 mg | ORAL_TABLET | Freq: Every day | ORAL | 0 refills | Status: DC | PRN

## 2016-01-15 NOTE — Telephone Encounter (Signed)
HCTZ rx called to [facility]. 

## 2016-01-22 ENCOUNTER — Telehealth: Payer: Self-pay | Admitting: *Deleted

## 2016-01-22 NOTE — Telephone Encounter (Signed)
Per doris called pharm, gave AJ scripts from 10/5 again ask him to call pt and let her know she can pick up, he agreed

## 2016-02-17 ENCOUNTER — Other Ambulatory Visit: Payer: Self-pay

## 2016-02-17 DIAGNOSIS — M159 Polyosteoarthritis, unspecified: Secondary | ICD-10-CM

## 2016-02-17 DIAGNOSIS — M150 Primary generalized (osteo)arthritis: Principal | ICD-10-CM

## 2016-02-17 NOTE — Telephone Encounter (Signed)
ibuprofen (ADVIL,MOTRIN),  traMADol (ULTRAM) 50 MG tablet, Refill request.

## 2016-02-18 MED ORDER — TRAMADOL HCL 50 MG PO TABS: 50.0000 mg | ORAL_TABLET | Freq: Every day | ORAL | 0 refills | Status: DC | PRN

## 2016-02-18 MED ORDER — IBUPROFEN 600 MG PO TABS
600.0000 mg | ORAL_TABLET | Freq: Every day | ORAL | 0 refills | Status: DC | PRN
Start: 2016-02-18 — End: 2016-03-18

## 2016-02-18 NOTE — Telephone Encounter (Signed)
Called to pharm 

## 2016-02-19 ENCOUNTER — Telehealth: Payer: Self-pay

## 2016-02-19 NOTE — Telephone Encounter (Signed)
It was sent and called to the pharm 11/8, when rtc to pt she said she had checked w/ the pharm and has gotten her meds now

## 2016-02-19 NOTE — Telephone Encounter (Signed)
Please call pt back regarding meds.  

## 2016-03-12 ENCOUNTER — Other Ambulatory Visit: Payer: Self-pay | Admitting: Internal Medicine

## 2016-03-18 ENCOUNTER — Other Ambulatory Visit: Payer: Self-pay | Admitting: Internal Medicine

## 2016-03-18 DIAGNOSIS — M159 Polyosteoarthritis, unspecified: Secondary | ICD-10-CM

## 2016-03-18 DIAGNOSIS — M150 Primary generalized (osteo)arthritis: Principal | ICD-10-CM

## 2016-03-22 ENCOUNTER — Other Ambulatory Visit: Payer: Self-pay | Admitting: *Deleted

## 2016-03-22 DIAGNOSIS — M150 Primary generalized (osteo)arthritis: Principal | ICD-10-CM

## 2016-03-22 DIAGNOSIS — M159 Polyosteoarthritis, unspecified: Secondary | ICD-10-CM

## 2016-03-22 MED ORDER — TRAMADOL HCL 50 MG PO TABS: 50.0000 mg | ORAL_TABLET | Freq: Every day | ORAL | 0 refills | Status: DC | PRN

## 2016-03-23 NOTE — Telephone Encounter (Signed)
Tramadol rx called to [facility] ; pt ca//ed and informed.

## 2016-03-23 NOTE — Addendum Note (Signed)
Addended by: Hulan Fray on: 03/23/2016 05:44 PM   Modules accepted: Orders

## 2016-04-01 ENCOUNTER — Other Ambulatory Visit: Payer: Self-pay | Admitting: Orthopedic Surgery

## 2016-04-13 ENCOUNTER — Encounter: Payer: Self-pay | Admitting: Internal Medicine

## 2016-04-13 DIAGNOSIS — F1120 Opioid dependence, uncomplicated: Secondary | ICD-10-CM | POA: Insufficient documentation

## 2016-04-16 ENCOUNTER — Other Ambulatory Visit: Payer: Self-pay | Admitting: Internal Medicine

## 2016-04-16 DIAGNOSIS — M159 Polyosteoarthritis, unspecified: Secondary | ICD-10-CM

## 2016-04-16 DIAGNOSIS — M150 Primary generalized (osteo)arthritis: Principal | ICD-10-CM

## 2016-04-16 NOTE — Pre-Procedure Instructions (Addendum)
Brandi Chase  04/16/2016      [facility] [place] [id number] [place] ([place]) [place] [address] Phone: [phone] Fax: [phone]    Your procedure is scheduled on 04/26/16.  Report to [facility] Admitting at [phone] A.M.  Call this number if you have problems the morning of surgery:  [phone]   Remember:  Do not eat food or drink liquids after midnight.  Take these medicines the morning of surgery with A SIP OF WATER    Inhaler if needed(bring to hospital), oxycodone or tramadol if needed  STOP all herbel meds, nsaids (aleve,naproxen,advil,ibuprofen) 7 days prior to surgery starting 04/19/16 including aspirin, all vitamins/supplements   Do not wear jewelry, make-up or nail polish.  Do not wear lotions, powders, or perfumes, or deoderant.  Do not shave 48 hours prior to surgery.  Men may shave face and neck.  Do not bring valuables to the hospital.  [facility] is not responsible for any belongings or valuables.  Contacts, dentures or bridgework may not be worn into surgery.  Leave your suitcase in the car.  After surgery it may be brought to your room.  For patients admitted to the hospital, discharge time will be determined by your treatment team.  Patients discharged the day of surgery will not be allowed to drive home.   Special instructions:   Special Instructions: [place] - Preparing for Surgery  Before surgery, you can play an important role.  Because skin is not sterile, your skin needs to be as free of germs as possible.  You can reduce the number of germs on you skin by washing with CHG (chlorahexidine gluconate) soap before surgery.  CHG is an antiseptic cleaner which kills germs and bonds with the skin to continue killing germs even after washing.  Please DO NOT use if you have an allergy to CHG or antibacterial soaps.  If your skin becomes reddened/irritated stop using the CHG and inform your  nurse when you arrive at Short Stay.  Do not shave (including legs and underarms) for at least 48 hours prior to the first CHG shower.  You may shave your face.  Please follow these instructions carefully:   1.  Shower with CHG Soap the night before surgery and the morning of Surgery.  2.  If you choose to wash your hair, wash your hair first as usual with your normal shampoo.  3.  After you shampoo, rinse your hair and body thoroughly to remove the Shampoo.  4.  Use CHG as you would any other liquid soap.  You can apply chg directly  to the skin and wash gently with scrungie or a clean washcloth.  5.  Apply the CHG Soap to your body ONLY FROM THE NECK DOWN.  Do not use on open wounds or open sores.  Avoid contact with your eyes ears, mouth and genitals (private parts).  Wash genitals (private parts)       with your normal soap.  6.  Wash thoroughly, paying special attention to the area where your surgery will be performed.  7.  Thoroughly rinse your body with warm water from the neck down.  8.  DO NOT shower/wash with your normal soap after using and rinsing off the CHG Soap.  9.  Pat yourself dry with a clean towel.            10.  Wear clean pajamas.  11.  Place clean sheets on your bed the night of your first shower and do not sleep with pets.  Day of Surgery  Do not apply any lotions/deodorants the morning of surgery.  Please wear clean clothes to the hospital/surgery center.  Please read over the fact sheets that you were given.

## 2016-04-19 ENCOUNTER — Encounter (HOSPITAL_COMMUNITY): Payer: Self-pay

## 2016-04-19 ENCOUNTER — Encounter (HOSPITAL_COMMUNITY)
Admission: RE | Admit: 2016-04-19 | Discharge: 2016-04-19 | Disposition: A | Discharge status: Home / Self Care | Payer: Self-pay | Source: Ambulatory Visit | Attending: Orthopedic Surgery | Admitting: Orthopedic Surgery

## 2016-04-19 ENCOUNTER — Other Ambulatory Visit: Payer: Self-pay

## 2016-04-19 DIAGNOSIS — Z01810 Encounter for preprocedural cardiovascular examination: Secondary | ICD-10-CM | POA: Insufficient documentation

## 2016-04-19 DIAGNOSIS — Z01818 Encounter for other preprocedural examination: Secondary | ICD-10-CM | POA: Insufficient documentation

## 2016-04-19 DIAGNOSIS — Z01812 Encounter for preprocedural laboratory examination: Secondary | ICD-10-CM | POA: Insufficient documentation

## 2016-04-19 HISTORY — DX: Dyspnea, unspecified: R06.00

## 2016-04-19 LAB — CBC WITH DIFFERENTIAL/PLATELET
BASOS ABS: 0 10*3/uL (ref 0.0–0.1)
BASOS PCT: 0 %
Eosinophils Absolute: 0.2 10*3/uL (ref 0.0–0.7)
Eosinophils Relative: 2 %
HEMATOCRIT: 37.3 % (ref 36.0–46.0)
Hemoglobin: 12.1 g/dL (ref 12.0–15.0)
LYMPHS PCT: 32 %
Lymphs Abs: 2.7 10*3/uL (ref 0.7–4.0)
MCH: 27.3 pg (ref 26.0–34.0)
MCHC: 32.4 g/dL (ref 30.0–36.0)
MCV: 84.2 fL (ref 78.0–100.0)
Monocytes Absolute: 0.3 10*3/uL (ref 0.1–1.0)
Monocytes Relative: 4 %
NEUTROS ABS: 5.3 10*3/uL (ref 1.7–7.7)
NEUTROS PCT: 62 %
Platelets: 346 10*3/uL (ref 150–400)
RBC: 4.43 MIL/uL (ref 3.87–5.11)
RDW: 15.2 % (ref 11.5–15.5)
WBC: 8.4 10*3/uL (ref 4.0–10.5)

## 2016-04-19 LAB — TYPE AND SCREEN
ABO/RH(D): O POS
ANTIBODY SCREEN: NEGATIVE

## 2016-04-19 LAB — BASIC METABOLIC PANEL
ANION GAP: 8 (ref 5–15)
BUN: 14 mg/dL (ref 6–20)
CALCIUM: 8.9 mg/dL (ref 8.9–10.3)
CO2: 23 mmol/L (ref 22–32)
Chloride: 107 mmol/L (ref 101–111)
Creatinine, Ser: 0.8 mg/dL (ref 0.44–1.00)
Glucose, Bld: 91 mg/dL (ref 65–99)
POTASSIUM: 4 mmol/L (ref 3.5–5.1)
SODIUM: 138 mmol/L (ref 135–145)

## 2016-04-19 LAB — URINALYSIS, ROUTINE W REFLEX MICROSCOPIC
BILIRUBIN URINE: NEGATIVE
Bacteria, UA: NONE SEEN
Glucose, UA: NEGATIVE mg/dL
KETONES UR: NEGATIVE mg/dL
Leukocytes, UA: NEGATIVE
Nitrite: NEGATIVE
PH: 5 (ref 5.0–8.0)
Protein, ur: NEGATIVE mg/dL
Specific Gravity, Urine: 1.02 (ref 1.005–1.030)

## 2016-04-19 LAB — SURGICAL PCR SCREEN
MRSA, PCR: NEGATIVE
Staphylococcus aureus: NEGATIVE

## 2016-04-19 LAB — APTT: APTT: 29 s (ref 24–36)

## 2016-04-19 LAB — PROTIME-INR
INR: 0.98
Prothrombin Time: 13 seconds (ref 11.4–15.2)

## 2016-04-19 LAB — HCG, SERUM, QUALITATIVE: Preg, Serum: NEGATIVE

## 2016-04-19 NOTE — Progress Notes (Signed)
Cp complaint in 9/17 note in epic 12/19/15 s/p surgery.home nurse monitored-consulted pcp ? relaed to some medication pt was on per dr. No cardiac test or vosot to cardiac dr. Lasted 3-4 weeks and then went away.normal ekg

## 2016-04-26 ENCOUNTER — Inpatient Hospital Stay (HOSPITAL_COMMUNITY): Admission: RE | Admit: 2016-04-26 | Payer: Medicaid Other | Source: Ambulatory Visit | Admitting: Orthopedic Surgery

## 2016-04-26 ENCOUNTER — Encounter (HOSPITAL_COMMUNITY): Admission: RE | Payer: Self-pay | Source: Ambulatory Visit

## 2016-04-26 SURGERY — ARTHROPLASTY, HIP, TOTAL, ANTERIOR APPROACH
Anesthesia: Spinal | Laterality: Left

## 2016-05-04 ENCOUNTER — Other Ambulatory Visit: Payer: Self-pay | Admitting: *Deleted

## 2016-05-04 ENCOUNTER — Other Ambulatory Visit: Payer: Self-pay | Admitting: Internal Medicine

## 2016-05-04 DIAGNOSIS — M159 Polyosteoarthritis, unspecified: Secondary | ICD-10-CM

## 2016-05-04 DIAGNOSIS — M150 Primary generalized (osteo)arthritis: Principal | ICD-10-CM

## 2016-05-04 NOTE — Telephone Encounter (Signed)
Tramadol refill [facility]

## 2016-05-05 MED ORDER — TRAMADOL HCL 50 MG PO TABS: 50.0000 mg | ORAL_TABLET | Freq: Every day | ORAL | 0 refills | Status: DC | PRN

## 2016-05-05 NOTE — Telephone Encounter (Signed)
Tramado rx called to [facility].

## 2016-05-05 NOTE — Telephone Encounter (Signed)
Request has been sent to PCP

## 2016-05-05 NOTE — Telephone Encounter (Signed)
Pt calling about med refill would like nurse to call asap

## 2016-05-28 ENCOUNTER — Other Ambulatory Visit: Payer: Self-pay | Admitting: Internal Medicine

## 2016-05-28 ENCOUNTER — Emergency Department (HOSPITAL_COMMUNITY)
Admission: EM | Admit: 2016-05-28 | Discharge: 2016-05-28 | Disposition: A | Discharge status: Home / Self Care | Payer: Self-pay | Source: Home / Self Care | Attending: Emergency Medicine | Admitting: Emergency Medicine

## 2016-05-28 ENCOUNTER — Emergency Department (HOSPITAL_COMMUNITY): Payer: Self-pay

## 2016-05-28 ENCOUNTER — Encounter (HOSPITAL_COMMUNITY): Payer: Self-pay | Admitting: Nurse Practitioner

## 2016-05-28 ENCOUNTER — Encounter (HOSPITAL_COMMUNITY): Payer: Self-pay | Admitting: *Deleted

## 2016-05-28 DIAGNOSIS — I10 Essential (primary) hypertension: Secondary | ICD-10-CM

## 2016-05-28 DIAGNOSIS — M25552 Pain in left hip: Secondary | ICD-10-CM

## 2016-05-28 DIAGNOSIS — Z91040 Latex allergy status: Secondary | ICD-10-CM | POA: Insufficient documentation

## 2016-05-28 DIAGNOSIS — Z79899 Other long term (current) drug therapy: Secondary | ICD-10-CM

## 2016-05-28 DIAGNOSIS — Z96641 Presence of right artificial hip joint: Secondary | ICD-10-CM | POA: Insufficient documentation

## 2016-05-28 DIAGNOSIS — J45909 Unspecified asthma, uncomplicated: Secondary | ICD-10-CM | POA: Insufficient documentation

## 2016-05-28 DIAGNOSIS — M159 Polyosteoarthritis, unspecified: Secondary | ICD-10-CM

## 2016-05-28 DIAGNOSIS — M1612 Unilateral primary osteoarthritis, left hip: Secondary | ICD-10-CM | POA: Insufficient documentation

## 2016-05-28 DIAGNOSIS — M150 Primary generalized (osteo)arthritis: Principal | ICD-10-CM

## 2016-05-28 MED ORDER — OXYCODONE-ACETAMINOPHEN 5-325 MG PO TABS
1.0000 | ORAL_TABLET | Freq: Once | ORAL | Status: DC
  Filled 2016-05-28: qty 1

## 2016-05-28 MED ORDER — KETOROLAC TROMETHAMINE 60 MG/2ML IM SOLN
60.0000 mg | Freq: Once | INTRAMUSCULAR | Status: AC
  Administered 2016-05-28: 60 mg via INTRAMUSCULAR
  Filled 2016-05-28: qty 2

## 2016-05-28 NOTE — Telephone Encounter (Signed)
Called pt - appt given for 05/31/16 in [facility].

## 2016-05-28 NOTE — Discharge Instructions (Signed)
Can continue your tramadol as directed.  If you need to increase dosing to twice daily this would be acceptable.  Can continue tylenol or motrin with this as well for added relief. Recommend to follow-up with Dr.Rowan as soon as possible.   Return here for new/worsening symptoms.

## 2016-05-28 NOTE — ED Triage Notes (Signed)
Pt reports she was just discharged tonight, was unable to get in the car d/t severe pain.  Pt reports L hip pain, unable to bear weight.  Has ice pack on her L hip at present.

## 2016-05-28 NOTE — ED Provider Notes (Signed)
Brandi Chase   CSN: [id number] Arrival date & time: 05/28/16  [place]     History   Chief Complaint Chief Complaint  Patient presents with  . Hip Pain    left    HPI Brandi Chase is a 51 y.o. female.  The history is provided by the patient and medical records.    51 year old female with history of anemia, arthritis, chronic back pain, hypertension, presenting to the ED for left hip pain. Patient states 6 months ago she had a right hip replacement and is due to have left hip replaced soon. This was supposed to take place in January, however there were issues with her insurance. She reports today she felt a "pop" in her left hip and since then has had sharp, stabbing pains radiating from her left it down the left leg towards the knee. Pain does not descend past the knee. She denies any numbness or weakness of her left leg. Pain is better with sitting still, worse with movement. States she is followed by orthopedics, Dr. Mayer Camel. States she takes tramadol for pain at home, however usually reserves this for nighttime. She did take some ibuprofen earlier today which "took the edge off" but has not given her any significant pain relief.  Past Medical History:  Diagnosis Date  . Adnexal cyst 2011   removed  . Anemia   . Arthritis    "neck; spine" (08/29/2014)  . Asthma   . Carpal tunnel syndrome of left wrist   . Chronic lower back pain   . Complication of anesthesia    difficult to awake  . Daily headache 12/2013   "tx'd and no problems anymore" (08/29/2014)  . Dyspnea    with asthma attack- none recent  . Family history of adverse reaction to anesthesia    "1 sister and both parents hard to wake up"  . Heart murmur    "dx'd when I was a teenager; haven't had any problems w/it"  . Hip pain, right   . History of bronchitis   . Hypertension   . Urinary incontinence   . Urinary urgency   . UTI (lower urinary tract infection)     Patient Active Problem  List   Diagnosis Date Noted  . Opioid dependence ([facility]) 04/13/2016  . Localized osteoarthrosis of right hip 11/26/2015  . Primary localized osteoarthritis of right hip 11/23/2015  . Absolute anemia 11/13/2015  . Solar lentigo 11/13/2015  . Iron deficiency anemia due to chronic blood loss 08/15/2015  . DUB (dysfunctional uterine bleeding) 08/15/2015  . Uterus, adenomyosis 07/25/2015  . Dysuria 07/18/2015  . 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 Dysfunctional uterine bleeding 07/11/2015  . Essential hypertension 04/17/2015  . Healthcare maintenance 04/17/2015  . Back pain 08/29/2014  . H/O pelvic mass 05/24/2013  . Sore throat 12/11/2010  . Allergic rhinitis 12/11/2010  . SHOULDER PAIN, LEFT 03/05/2009  . Asthma 09/12/2008  . Osteoarthritis 09/12/2008    Past Surgical History:  Procedure Laterality Date  . BACK SURGERY     x2  . JOINT REPLACEMENT Right 11/2015   hip  . OVARIAN CYST REMOVAL Right 2011  . REDUCTION MAMMAPLASTY Bilateral 1995  . 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  . 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   Procedure: TOTAL HIP ARTHROPLASTY ANTERIOR APPROACH;  Surgeon: Frederik Pear, MD;  Location: [facility];  Service: Orthopedics;  Laterality: Right;  . TRANSFORAMINAL LUMBAR INTERBODY FUSION (TLIF) WITH PEDICLE SCREW FIXATION 2 LEVEL N/A 08/29/2014   Procedure: TRANSFORAMINAL  LUMBAR INTERBODY FUSION (TLIF) L4-S1;  Surgeon: Melina Schools, MD;  Location: [facility];  Service: Orthopedics;  Laterality: N/A;  . TUBAL LIGATION  1992    OB History    Gravida Para Term Preterm AB Living   3 3       3    SAB TAB Ectopic Multiple Live Births                   Home Medications    Prior to Admission medications   Medication Sig Start Date End Date Taking? Authorizing Provider  albuterol (PROVENTIL HFA;VENTOLIN HFA) 108 (90 Base) MCG/ACT inhaler Inhale 2 puffs into the lungs every 6 (six) hours as needed for wheezing or shortness of breath. 11/13/15   Valinda Party, DO    Cholecalciferol (VITAMIN D PO) Take 1 tablet by mouth daily as needed (hand-tingling).    Historical Provider, MD  ferrous sulfate 325 (65 FE) MG tablet Take 1 tablet (325 mg total) by mouth 3 (three) times daily with meals. Patient taking differently: Take 325 mg by mouth See admin instructions. Takes 325mg  twice a week. 11/13/15   Jessica Ratliff Hoffman, DO  hydrochlorothiazide (MICROZIDE) 12.5 MG capsule TAKE ONE CAPSULE BY MOUTH ONCE DAILY 05/28/16   Valinda Party, DO  ibuprofen (ADVIL,MOTRIN) 600 MG tablet TAKE ONE TABLET BY MOUTH ONCE DAILY AS NEEDED FOR MILD  TO  MODERATE  PAIN 05/28/16   Valinda Party, DO  magnesium oxide (MAG-OX) 400 MG tablet Take 400 mg by mouth daily as needed (hand-tingling).     Historical Provider, MD  megestrol (MEGACE) 20 MG tablet Take 2 tablets (40 mg total) by mouth daily. Start with 2 tabs every 8 hrs x 2 days, then 2 tabs every 12 hrs, then two tabs daily Patient taking differently: Take 40 mg by mouth daily as needed (leg cramps).  07/11/15   Seabron Spates, CNM  methocarbamol (ROBAXIN) 500 MG tablet Take 1 tablet (500 mg total) by mouth every 6 (six) hours as needed for muscle spasms. 11/26/15   Leighton Parody, PA-C  oxyCODONE-acetaminophen (ROXICET) 5-325 MG tablet Take 1 tablet by mouth every 4 (four) hours as needed. Patient taking differently: Take 1 tablet by mouth every 4 (four) hours as needed (pain).  11/26/15   Leighton Parody, PA-C  Potassium Chloride ER 20 MEQ TBCR TAKE ONE TABLET BY MOUTH ONCE DAILY Patient taking differently: TAKE ONE TABLET BY MOUTH TWICE A WEEK. 12/24/15   Valinda Party, DO  traMADol (ULTRAM) 50 MG tablet Take 1 tablet (50 mg total) by mouth daily as needed. 05/05/16   Valinda Party, DO    Family History History reviewed. No pertinent family history.  Social History Social History  Substance Use Topics  . Smoking status: Never Smoker  . Smokeless tobacco: Never Used  . Alcohol use No      Allergies   Other; Primatene mist [epinephrine]; Shrimp [shellfish allergy]; Aleve [naproxen sodium]; Bactrim ds [sulfamethoxazole w/trimethoprim (co-trimoxazole)]; Avocado; Cocoa butter; Latex; and Midol [ibuprofen]   Review of Systems Review of Systems  Musculoskeletal: Positive for arthralgias.  All other systems reviewed and are negative.    Physical Exam Updated Vital Signs BP 146/71 (BP Location: Left Arm)   Pulse 93   Temp 98.2 F (36.8 C) (Oral)   Resp 16   SpO2 100%   Physical Exam  Constitutional: She is oriented to person, place, and time. She appears well-developed and well-nourished.  HENT:  Head: Normocephalic  and atraumatic.  Mouth/Throat: Oropharynx is clear and moist.  Eyes: Conjunctivae and EOM are normal. Pupils are equal, round, and reactive to light.  Neck: Normal range of motion.  Cardiovascular: Normal rate, regular rhythm and normal heart sounds.   Pulmonary/Chest: Effort normal and breath sounds normal.  Abdominal: Soft. Bowel sounds are normal.  Musculoskeletal: Normal range of motion.  Left hip with some tenderness along the lateral and posterior aspects, there is no gross deformity or signs of dislocation, no leg shortening, pelvis is stable; DP pulses intact bilaterally  Neurological: She is alert and oriented to person, place, and time.  Skin: Skin is warm and dry.  Psychiatric: She has a normal mood and affect.  Nursing Chase and vitals reviewed.    ED Treatments / Results  Labs (all labs ordered are listed, but only abnormal results are displayed) Labs Reviewed - No data to display  EKG  EKG Interpretation None       Radiology Dg Hip Unilat W Or Wo Pelvis 2-3 Views Left  Result Date: 05/28/2016 CLINICAL DATA:  Chronic left hip pain for 4 months. Unable to walk on left hip. Pain radiates to the left knee. Initial encounter. EXAM: DG HIP (WITH OR WITHOUT PELVIS) 2-3V LEFT COMPARISON:  None. FINDINGS: There is loss of the  superior joint space at the left hip joint, with associated sclerosis and subcortical cystic change. Underlying osteophyte formation is noted about the left acetabulum. Lateral displacement of the left femoral head may reflect an underlying hip joint effusion. The right hip arthroplasty is grossly unremarkable in appearance, without evidence of loosening or fracture, though incompletely characterized on this study. Lumbar spinal fusion hardware is partially imaged. The sacroiliac joints are grossly unremarkable. The visualized bowel gas pattern is unremarkable in appearance. IMPRESSION: 1. No evidence of fracture or dislocation. 2. Loss of the superior joint space of the left hip joint, with associated sclerosis and subcortical cystic change. Osteophyte formation about the left acetabulum. Lateral displacement of the left femoral head may reflect an underlying hip joint effusion. Electronically Signed   By: Garald Balding M.D.   On: 05/28/2016 20:07    Procedures Procedures (including critical care time)  Medications Ordered in ED Medications - No data to display   Initial Impression / Assessment and Plan / ED Course  I have reviewed the triage vital signs and the nursing notes.  Pertinent labs & imaging results that were available during my care of the patient were reviewed by me and considered in my medical decision making (see chart for details).  51 year old female here with left hip pain. She is due for left THA, was scheduled for earlier this year but was canceled due to insurance issues. Reports she felt a "pop" today and had worsening pain since. Patient is afebrile and nontoxic in appearance. She has tenderness of the left lateral and posterior hip without gross deformity. Pelvis is stable, no leg shortening. Left leg is neurovascularly intact. No signs/symptoms concerning for septic joint.  X-ray obtained, loss of joint space as well as osteophyte formation.  There are also findings  concerning for possible joint effusion but no dislocation or fracture.  Results discussed with patient, she acknowledged understanding. She requested shot of Toradol which was given here. She has tramadol that she will continue to take home. I recommended that she follow-up closely with her orthopedist, Dr. Mayer Camel, as soon as possible. She was given a CD of her x-ray as well as printout of report for his  review.  Discussed plan with patient, he/she acknowledged understanding and agreed with plan of care.  Return precautions given for new or worsening symptoms.  Final Clinical Impressions(s) / ED Diagnoses   Final diagnoses:  Left hip pain    New Prescriptions New Prescriptions   No medications on file     Larene Pickett, Hershal Coria 05/28/16 [id number]    Varney Biles, MD 05/29/16 [phone]

## 2016-05-28 NOTE — ED Notes (Signed)
Bed: WA23 Expected date:  Expected time:  Means of arrival:  Comments: Hip pain 

## 2016-05-28 NOTE — ED Notes (Signed)
Pt. Preferred Toradol than percocet, stated that Percocet gave her headache and heartburn after taking it. Will notify MD.

## 2016-05-28 NOTE — ED Notes (Signed)
Patient transported to X-ray 

## 2016-05-28 NOTE — ED Triage Notes (Signed)
Patient presents today via [facility] for concerns of left hip pain that started today. She describes pain as sharp radiating from hip to knee. She has tramadol at home for pain but did not take any as she says she prefers to take it at bedtime. Walks with cane. No falls. Hx of right hip replacement. Rates pain 8/10.

## 2016-05-29 ENCOUNTER — Emergency Department (HOSPITAL_COMMUNITY)
Admission: EM | Admit: 2016-05-29 | Discharge: 2016-05-29 | Disposition: A | Discharge status: Home / Self Care | Payer: Self-pay | Attending: Emergency Medicine | Admitting: Emergency Medicine

## 2016-05-29 ENCOUNTER — Emergency Department (HOSPITAL_COMMUNITY): Payer: Self-pay

## 2016-05-29 DIAGNOSIS — M25552 Pain in left hip: Secondary | ICD-10-CM

## 2016-05-29 DIAGNOSIS — M1612 Unilateral primary osteoarthritis, left hip: Secondary | ICD-10-CM

## 2016-05-29 MED ORDER — OXYCODONE-ACETAMINOPHEN 5-325 MG PO TABS: 1.0000 | ORAL_TABLET | Freq: Four times a day (QID) | ORAL | 0 refills | Status: DC | PRN

## 2016-05-29 MED ORDER — MORPHINE SULFATE (PF) 4 MG/ML IV SOLN
4.0000 mg | Freq: Once | INTRAVENOUS | Status: AC
  Administered 2016-05-29: 4 mg via INTRAVENOUS
  Filled 2016-05-29: qty 1

## 2016-05-29 MED ORDER — HYDROMORPHONE HCL 1 MG/ML IJ SOLN
1.0000 mg | Freq: Once | INTRAMUSCULAR | Status: AC
  Administered 2016-05-29: 1 mg via INTRAVENOUS
  Filled 2016-05-29: qty 1

## 2016-05-29 MED ORDER — METHOCARBAMOL 1000 MG/10ML IJ SOLN
1000.0000 mg | Freq: Once | INTRAVENOUS | Status: AC
  Administered 2016-05-29: 1000 mg via INTRAVENOUS
  Filled 2016-05-29: qty 10

## 2016-05-29 MED ORDER — ONDANSETRON HCL 4 MG/2ML IJ SOLN
4.0000 mg | Freq: Once | INTRAMUSCULAR | Status: AC
  Administered 2016-05-29: 4 mg via INTRAVENOUS
  Filled 2016-05-29: qty 2

## 2016-05-29 MED ORDER — METHOCARBAMOL 1000 MG/10ML IJ SOLN: 1000.0000 mg | Freq: Once | INTRAMUSCULAR | Status: DC

## 2016-05-29 NOTE — ED Notes (Signed)
Pt unable to ambulate even with her cane or with two-person assist---- pt was grimacing in pain with attempt to walk.

## 2016-05-29 NOTE — ED Provider Notes (Addendum)
Patient w difficulty with ambulation after receiving additional analgesia.  I have discussed her case with our case management colleagues.  Patient has been set up with rehab.  2:44 PM History questions for multiple family members for quite some time. Specifically we discussed the patient's lack of eligibility for rehabilitation without self-pay, and their discussions with case management with resolution for outpatient rehabilitation. Encouraged him to take advantage of financial assistance opportunities, follow up with orthopedics, primary care, and continue with rehabilitation.    Carmin Muskrat, MD 05/29/16 [id number]    Carmin Muskrat, MD 05/29/16 [phone]

## 2016-05-29 NOTE — ED Notes (Signed)
Patient is alert and oriented x3.  She was given DC instructions and follow up visit instructions.  Patient gave verbal understanding. She was DC ambulatory under her own power to home.  V/S stable.  He was not showing any signs of distress on DC 

## 2016-05-29 NOTE — Care Management Note (Signed)
Case Management Note  Patient Details  Name: Brandi Chase MRN: [id number] Date of Birth: Mar 31, 1966  Subjective/Objective:     Left Knee Pain               Action/Plan: Discharge Planning: NCM spoke to pt and she was scheduled to have left hip surgery in Jan 2018 but she did not meet her Medicaid deductible. Pt reports she has RW and 3n1 bedside commode at home. Pt states she would like Outpt PT. Faxed order to [facility] Outpt PT. Provided pt with contact number. Explained they will call her to arrange appt. Her daughters are at home to assist with her care. Has appt with PCP on 05/31/2016  PCP HOFFMAN, JESSICA RATLIFF MD    Expected Discharge Date:                  Expected Discharge Plan:  Home/Self Care  In-House Referral:  NA  Discharge planning Services  CM Consult  Post Acute Care Choice:  NA Choice offered to:  NA  DME Arranged:  N/A DME Agency:  NA  HH Arranged:  NA HH Agency:  NA  Status of Service:  Completed, signed off  If discussed at [place] of Stay Meetings, dates discussed:    Additional Comments:  Erenest Rasher, RN 05/29/2016, 12:19 PM

## 2016-05-29 NOTE — ED Provider Notes (Signed)
[facility] DEPT Provider Note   CSN: [id number] Arrival date & time: 05/28/16  2146  By signing my name below, I, Dora Sims, attest that this documentation has been prepared under the direction and in the presence of physician practitioner, Delora Fuel, MD. Electronically Signed: Dora Sims, Scribe. 05/29/2016. 1:26 AM.  History   Chief Complaint Chief Complaint  Patient presents with  . Hip Pain    The history is provided by the patient. No language interpreter was used.     HPI Comments: Brandi Chase is a 51 y.o. female who presents to the Emergency Department complaining of constant, gradually worsening, severe, left hip pain for several months. Pt rates her current pain at 6/10. She states her hip pain is worse with weight bearing, ambulation, and general movement of her left hip. She reports her pain is currently so severe that she cannot bear weight on her LLE. Pt was seen here yesterday (2/16) for the same and was given a Toradol injection and Percocet with transient relief of her left hip pain; she notes she came back to the ED because she was unable to get into her car due to the severity of her left hip pain. She also had an x-ray during the visit yesterday which revealed osteophyte formation in her left hip. She notes she has been in need of a left hip replacement for an extended period of time and was scheduled for one last month, but could not have the surgery due to complications with her health insurance. She has a PSHx of right THA. Pt ambulates with a walker at baseline. She denies numbness/tingling or any other associated symptoms.  Past Medical History:  Diagnosis Date  . Adnexal cyst 2011   removed  . Anemia   . Arthritis    "neck; spine" (08/29/2014)  . Asthma   . Carpal tunnel syndrome of left wrist   . Chronic lower back pain   . Complication of anesthesia    difficult to awake  . Daily headache 12/2013   "tx'd and no problems anymore"  (08/29/2014)  . Dyspnea    with asthma attack- none recent  . Family history of adverse reaction to anesthesia    "1 sister and both parents hard to wake up"  . Heart murmur    "dx'd when I was a teenager; haven't had any problems w/it"  . Hip pain, right   . History of bronchitis   . Hypertension   . Urinary incontinence   . Urinary urgency   . UTI (lower urinary tract infection)     Patient Active Problem List   Diagnosis Date Noted  . Opioid dependence ([place]) 04/13/2016  . Localized osteoarthrosis of right hip 11/26/2015  . Primary localized osteoarthritis of right hip 11/23/2015  . Absolute anemia 11/13/2015  . Solar lentigo 11/13/2015  . Iron deficiency anemia due to chronic blood loss 08/15/2015  . DUB (dysfunctional uterine bleeding) 08/15/2015  . Uterus, adenomyosis 07/25/2015  . Dysuria 07/18/2015  . 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 Dysfunctional uterine bleeding 07/11/2015  . Essential hypertension 04/17/2015  . Healthcare maintenance 04/17/2015  . Back pain 08/29/2014  . H/O pelvic mass 05/24/2013  . Sore throat 12/11/2010  . Allergic rhinitis 12/11/2010  . SHOULDER PAIN, LEFT 03/05/2009  . Asthma 09/12/2008  . Osteoarthritis 09/12/2008    Past Surgical History:  Procedure Laterality Date  . BACK SURGERY     x2  . JOINT REPLACEMENT Right 11/2015   hip  . OVARIAN  CYST REMOVAL Right 2011  . REDUCTION MAMMAPLASTY Bilateral 1995  . 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  . 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   Procedure: TOTAL HIP ARTHROPLASTY ANTERIOR APPROACH;  Surgeon: Frederik Pear, MD;  Location: [facility];  Service: Orthopedics;  Laterality: Right;  . TRANSFORAMINAL LUMBAR INTERBODY FUSION (TLIF) WITH PEDICLE SCREW FIXATION 2 LEVEL N/A 08/29/2014   Procedure: TRANSFORAMINAL LUMBAR INTERBODY FUSION (TLIF) L4-S1;  Surgeon: Melina Schools, MD;  Location: [facility];  Service: Orthopedics;  Laterality: N/A;  . TUBAL LIGATION  1992    OB History    Gravida Para  Term Preterm AB Living   3 3       3    SAB TAB Ectopic Multiple Live Births                   Home Medications    Prior to Admission medications   Medication Sig Start Date End Date Taking? Authorizing Provider  albuterol (PROVENTIL HFA;VENTOLIN HFA) 108 (90 Base) MCG/ACT inhaler Inhale 2 puffs into the lungs every 6 (six) hours as needed for wheezing or shortness of breath. 11/13/15   Valinda Party, DO  ferrous sulfate 325 (65 FE) MG tablet Take 1 tablet (325 mg total) by mouth 3 (three) times daily with meals. Patient taking differently: Take 325 mg by mouth See admin instructions. Takes 325mg  twice a week. 11/13/15   Jessica Ratliff Hoffman, DO  hydrochlorothiazide (MICROZIDE) 12.5 MG capsule TAKE ONE CAPSULE BY MOUTH ONCE DAILY 05/28/16   Valinda Party, DO  ibuprofen (ADVIL,MOTRIN) 600 MG tablet TAKE ONE TABLET BY MOUTH ONCE DAILY AS NEEDED FOR MILD  TO  MODERATE  PAIN 05/28/16   Valinda Party, DO  megestrol (MEGACE) 20 MG tablet Take 2 tablets (40 mg total) by mouth daily. Start with 2 tabs every 8 hrs x 2 days, then 2 tabs every 12 hrs, then two tabs daily Patient taking differently: Take 40 mg by mouth daily as needed (leg cramps).  07/11/15   Seabron Spates, CNM  methocarbamol (ROBAXIN) 500 MG tablet Take 1 tablet (500 mg total) by mouth every 6 (six) hours as needed for muscle spasms. Patient not taking: Reported on 05/28/2016 11/26/15   Leighton Parody, PA-C  oxyCODONE-acetaminophen (ROXICET) 5-325 MG tablet Take 1 tablet by mouth every 4 (four) hours as needed. Patient not taking: Reported on 05/28/2016 11/26/15   Leighton Parody, PA-C  Potassium Chloride ER 20 MEQ TBCR TAKE ONE TABLET BY MOUTH ONCE DAILY Patient not taking: Reported on 05/28/2016 12/24/15   Valinda Party, DO  traMADol (ULTRAM) 50 MG tablet Take 1 tablet (50 mg total) by mouth daily as needed. 05/05/16   Valinda Party, DO    Family History No family history on  file.  Social History Social History  Substance Use Topics  . Smoking status: Never Smoker  . Smokeless tobacco: Never Used  . Alcohol use No     Allergies   Other; Primatene mist [epinephrine]; Shrimp [shellfish allergy]; Aleve [naproxen sodium]; Bactrim ds [sulfamethoxazole w/trimethoprim (co-trimoxazole)]; Avocado; Cocoa butter; Latex; and Midol [ibuprofen]   Review of Systems Review of Systems  Musculoskeletal: Positive for myalgias.  Neurological: Negative for numbness.  All other systems reviewed and are negative.    Physical Exam Updated Vital Signs BP 133/72 (BP Location: Right Arm)   Pulse 81   Temp 98.2 F (36.8 C) (Oral)   Resp 19   LMP 05/15/2016   SpO2 100%   Physical  Exam  Constitutional: She is oriented to person, place, and time. She appears well-developed and well-nourished.  HENT:  Head: Normocephalic and atraumatic.  Eyes: EOM are normal. Pupils are equal, round, and reactive to light.  Neck: Normal range of motion. Neck supple. No JVD present.  Cardiovascular: Normal rate, regular rhythm and normal heart sounds.   No murmur heard. Pulmonary/Chest: Effort normal and breath sounds normal. She has no wheezes. She has no rales. She exhibits no tenderness.  Abdominal: Soft. Bowel sounds are normal. She exhibits no distension and no mass. There is no tenderness.  Musculoskeletal: She exhibits tenderness. She exhibits no edema.  Tender to the lateral aspect of left hip. Pain on passive ROM of left hip.  Lymphadenopathy:    She has no cervical adenopathy.  Neurological: She is alert and oriented to person, place, and time. No cranial nerve deficit. She exhibits normal muscle tone. Coordination normal.  Skin: Skin is warm and dry. No rash noted.  Psychiatric: She has a normal mood and affect. Her behavior is normal. Judgment and thought content normal.  Nursing note and vitals reviewed.    ED Treatments / Results   Radiology Dg Hip Unilat W Or Wo  Pelvis 2-3 Views Left  Result Date: 05/28/2016 CLINICAL DATA:  Chronic left hip pain for 4 months. Unable to walk on left hip. Pain radiates to the left knee. Initial encounter. EXAM: DG HIP (WITH OR WITHOUT PELVIS) 2-3V LEFT COMPARISON:  None. FINDINGS: There is loss of the superior joint space at the left hip joint, with associated sclerosis and subcortical cystic change. Underlying osteophyte formation is noted about the left acetabulum. Lateral displacement of the left femoral head may reflect an underlying hip joint effusion. The right hip arthroplasty is grossly unremarkable in appearance, without evidence of loosening or fracture, though incompletely characterized on this study. Lumbar spinal fusion hardware is partially imaged. The sacroiliac joints are grossly unremarkable. The visualized bowel gas pattern is unremarkable in appearance. IMPRESSION: 1. No evidence of fracture or dislocation. 2. Loss of the superior joint space of the left hip joint, with associated sclerosis and subcortical cystic change. Osteophyte formation about the left acetabulum. Lateral displacement of the left femoral head may reflect an underlying hip joint effusion. Electronically Signed   By: Garald Balding M.D.   On: 05/28/2016 20:07    Procedures Procedures (including critical care time)  DIAGNOSTIC STUDIES: Oxygen Saturation is 100% on RA, normal by my interpretation.    COORDINATION OF CARE: 1:32 AM Discussed treatment plan with pt at bedside and pt agreed to plan.  Medications Ordered in ED Medications  HYDROmorphone (DILAUDID) injection 1 mg (not administered)  ondansetron (ZOFRAN) injection 4 mg (not administered)  morphine 4 MG/ML injection 4 mg (4 mg Intravenous Given 05/29/16 0213)  methocarbamol (ROBAXIN) 1,000 mg in dextrose 5 % 50 mL IVPB (0 mg Intravenous Stopped 05/29/16 0321)     Initial Impression / Assessment and Plan / ED Course  I have reviewed the triage vital signs and the nursing  notes.  Pertinent labs & imaging results that were available during my care of the patient were reviewed by me and considered in my medical decision making (see chart for details).  Patient with known arthritis of left hip, supposed to have left hip replacement which is currently not scheduled. Seen in ED earlier and discharge but unable to bear weight following ketorolac. She is sent for CT to look for occult fracture with and none is seen. She's given  morphine and methocarbamol. Following this, she still unable to bear weight. I have discussed options with the patient including admission for placement in a rehabilitation center. She states she wishes to go home. She'll be given a dose of hydromorphone to see if she is able to bear weight following this. Case is signed out to Dr. Vanita Panda to follow-up on her response to hydromorphone.  Final Clinical Impressions(s) / ED Diagnoses   Final diagnoses:  Left hip pain  Osteoarthritis of left hip, unspecified osteoarthritis type    New Prescriptions New Prescriptions   No medications on file   I personally performed the services described in this documentation, which was scribed in my presence. The recorded information has been reviewed and is accurate.      Delora Fuel, MD [id number]

## 2016-05-31 ENCOUNTER — Encounter (HOSPITAL_COMMUNITY): Payer: Self-pay | Admitting: Emergency Medicine

## 2016-05-31 ENCOUNTER — Emergency Department (HOSPITAL_COMMUNITY)
Admission: EM | Admit: 2016-05-31 | Discharge: 2016-05-31 | Disposition: A | Discharge status: Home / Self Care | Payer: Self-pay | Attending: Emergency Medicine | Admitting: Emergency Medicine

## 2016-05-31 ENCOUNTER — Ambulatory Visit: Payer: Self-pay

## 2016-05-31 DIAGNOSIS — Z91040 Latex allergy status: Secondary | ICD-10-CM | POA: Insufficient documentation

## 2016-05-31 DIAGNOSIS — Z96641 Presence of right artificial hip joint: Secondary | ICD-10-CM | POA: Insufficient documentation

## 2016-05-31 DIAGNOSIS — M25552 Pain in left hip: Secondary | ICD-10-CM | POA: Insufficient documentation

## 2016-05-31 DIAGNOSIS — M25562 Pain in left knee: Secondary | ICD-10-CM | POA: Insufficient documentation

## 2016-05-31 DIAGNOSIS — Z79899 Other long term (current) drug therapy: Secondary | ICD-10-CM | POA: Insufficient documentation

## 2016-05-31 DIAGNOSIS — G8929 Other chronic pain: Secondary | ICD-10-CM | POA: Insufficient documentation

## 2016-05-31 DIAGNOSIS — I10 Essential (primary) hypertension: Secondary | ICD-10-CM | POA: Insufficient documentation

## 2016-05-31 DIAGNOSIS — J45909 Unspecified asthma, uncomplicated: Secondary | ICD-10-CM | POA: Insufficient documentation

## 2016-05-31 MED ORDER — HYDROMORPHONE HCL 1 MG/ML IJ SOLN
1.0000 mg | Freq: Once | INTRAMUSCULAR | Status: AC
  Administered 2016-05-31: 1 mg via INTRAVENOUS
  Filled 2016-05-31: qty 1

## 2016-05-31 MED ORDER — KETOROLAC TROMETHAMINE 30 MG/ML IJ SOLN
30.0000 mg | Freq: Once | INTRAMUSCULAR | Status: AC
  Administered 2016-05-31: 30 mg via INTRAVENOUS
  Filled 2016-05-31: qty 1

## 2016-05-31 NOTE — ED Triage Notes (Signed)
PT ARRIVED FROM HOME VIA EMS C/O CONTINUED LEFT HIP AND LEFT KNEE PAIN THAT IS CHRONIC. PT NEEDS SURGERY, BUT HAS NOT BEEN SCHEDULED. PT WS SEEN HERE ON Friday FOR SAME, BUT HAS RUN OUT OF HER PAIN MEDICATION AND WANTS MORE. DENIES A NEW INJURY.

## 2016-05-31 NOTE — Discharge Instructions (Signed)
Read the information below.  You may return to the Emergency Department at any time for worsening condition or any new symptoms that concern you.   If you develop uncontrolled pain, weakness or numbness of the extremity, severe discoloration of the skin, or you are unable to move your hip, return to the ER for a recheck.

## 2016-05-31 NOTE — ED Provider Notes (Signed)
[facility] DEPT Provider Note   CSN: [id number] Arrival date & time: 05/31/16  1341     History   Chief Complaint No chief complaint on file.   HPI Brandi Chase is a 51 y.o. female.  HPI   Pt with chronic left hip and left knee pain with recently cancelled surgery due to insurance problems.  Has been seen in the ED 05/28/16, 05/29/16 with uncontrolled pain, treated and discharged home with increase in pain medication.  She is taking 1 percocet PO Q 6 hours now, with relief only for 2-3 hours.  She has contacted her orthopedist Dr Damita Dunnings office and has an appointment tomorrow morning at 8:30am.  She denies any fevers, weakness or numbness of the leg, any new injury or fall, any leg swelling.    Past Medical History:  Diagnosis Date  . Adnexal cyst 2011   removed  . Anemia   . Arthritis    "neck; spine" (08/29/2014)  . Asthma   . Carpal tunnel syndrome of left wrist   . Chronic lower back pain   . Complication of anesthesia    difficult to awake  . Daily headache 12/2013   "tx'd and no problems anymore" (08/29/2014)  . Dyspnea    with asthma attack- none recent  . Family history of adverse reaction to anesthesia    "1 sister and both parents hard to wake up"  . Heart murmur    "dx'd when I was a teenager; haven't had any problems w/it"  . Hip pain, right   . History of bronchitis   . Hypertension   . Urinary incontinence   . Urinary urgency   . UTI (lower urinary tract infection)     Patient Active Problem List   Diagnosis Date Noted  . Opioid dependence ([place]) 04/13/2016  . Localized osteoarthrosis of right hip 11/26/2015  . Primary localized osteoarthritis of right hip 11/23/2015  . Absolute anemia 11/13/2015  . Solar lentigo 11/13/2015  . Iron deficiency anemia due to chronic blood loss 08/15/2015  . DUB (dysfunctional uterine bleeding) 08/15/2015  . Uterus, adenomyosis 07/25/2015  . Dysuria 07/18/2015  . 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  Dysfunctional uterine bleeding 07/11/2015  . Essential hypertension 04/17/2015  . Healthcare maintenance 04/17/2015  . Back pain 08/29/2014  . H/O pelvic mass 05/24/2013  . Sore throat 12/11/2010  . Allergic rhinitis 12/11/2010  . SHOULDER PAIN, LEFT 03/05/2009  . Asthma 09/12/2008  . Osteoarthritis 09/12/2008    Past Surgical History:  Procedure Laterality Date  . BACK SURGERY     x2  . JOINT REPLACEMENT Right 11/2015   hip  . OVARIAN CYST REMOVAL Right 2011  . REDUCTION MAMMAPLASTY Bilateral 1995  . 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  . 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   Procedure: TOTAL HIP ARTHROPLASTY ANTERIOR APPROACH;  Surgeon: Frederik Pear, MD;  Location: [facility];  Service: Orthopedics;  Laterality: Right;  . TRANSFORAMINAL LUMBAR INTERBODY FUSION (TLIF) WITH PEDICLE SCREW FIXATION 2 LEVEL N/A 08/29/2014   Procedure: TRANSFORAMINAL LUMBAR INTERBODY FUSION (TLIF) L4-S1;  Surgeon: Melina Schools, MD;  Location: [facility];  Service: Orthopedics;  Laterality: N/A;  . TUBAL LIGATION  1992    OB History    Gravida Para Term Preterm AB Living   3 3       3    SAB TAB Ectopic Multiple Live Births                   Home Medications  Prior to Admission medications   Medication Sig Start Date End Date Taking? Authorizing Provider  hydrochlorothiazide (MICROZIDE) 12.5 MG capsule TAKE ONE CAPSULE BY MOUTH ONCE DAILY 05/28/16  Yes Jessica Ratliff Hoffman, DO  ibuprofen (ADVIL,MOTRIN) 600 MG tablet TAKE ONE TABLET BY MOUTH ONCE DAILY AS NEEDED FOR MILD  TO  MODERATE  PAIN 05/28/16  Yes Jessica Ratliff Hoffman, DO  megestrol (MEGACE) 20 MG tablet Take 2 tablets (40 mg total) by mouth daily. Start with 2 tabs every 8 hrs x 2 days, then 2 tabs every 12 hrs, then two tabs daily Patient taking differently: Take 40 mg by mouth daily as needed (leg cramps).  07/11/15  Yes Seabron Spates, CNM  oxyCODONE-acetaminophen (ROXICET) 5-325 MG tablet Take 1 tablet by mouth every 6 (six) hours  as needed for severe pain. 05/29/16  Yes Carmin Muskrat, MD  traMADol (ULTRAM) 50 MG tablet Take 1 tablet (50 mg total) by mouth daily as needed. 05/05/16  Yes Jessica Ratliff Hoffman, DO  albuterol (PROVENTIL HFA;VENTOLIN HFA) 108 (90 Base) MCG/ACT inhaler Inhale 2 puffs into the lungs every 6 (six) hours as needed for wheezing or shortness of breath. 11/13/15   Valinda Party, DO  ferrous sulfate 325 (65 FE) MG tablet Take 1 tablet (325 mg total) by mouth 3 (three) times daily with meals. Patient not taking: Reported on 05/31/2016 11/13/15   Valinda Party, DO  methocarbamol (ROBAXIN) 500 MG tablet Take 1 tablet (500 mg total) by mouth every 6 (six) hours as needed for muscle spasms. Patient not taking: Reported on 05/28/2016 11/26/15   Leighton Parody, PA-C  Potassium Chloride ER 20 MEQ TBCR TAKE ONE TABLET BY MOUTH ONCE DAILY Patient not taking: Reported on 05/28/2016 12/24/15   Valinda Party, DO    Family History No family history on file.  Social History Social History  Substance Use Topics  . Smoking status: Never Smoker  . Smokeless tobacco: Never Used  . Alcohol use No     Allergies   Other; Primatene mist [epinephrine]; Shrimp [shellfish allergy]; Aleve [naproxen sodium]; Bactrim ds [sulfamethoxazole w/trimethoprim (co-trimoxazole)]; Avocado; Cocoa butter; Latex; and Midol [ibuprofen]   Review of Systems Review of Systems  Constitutional: Negative for chills and fever.  Cardiovascular: Negative for leg swelling.  Musculoskeletal: Positive for arthralgias.  Skin: Negative for color change.  Allergic/Immunologic: Negative for immunocompromised state.  Neurological: Negative for weakness and numbness.  Psychiatric/Behavioral: Negative for self-injury.     Physical Exam Updated Vital Signs BP 157/89 (BP Location: Left Arm)   Pulse 87   Temp 97.9 F (36.6 C) (Oral)   Resp 12   LMP 05/15/2016   SpO2 100%   Physical Exam  Constitutional: She  appears well-developed and well-nourished. No distress.  HENT:  Head: Normocephalic and atraumatic.  Neck: Neck supple.  Pulmonary/Chest: Effort normal.  Musculoskeletal:  Left hip tender anteriorly, pain with passive ROM.  Left knee with diffuse edema, no focal tenderness.    No erythema, edema, warmth.  No calf swelling or tenderness.  Sensation intact, distal pulses intact.    Neurological: She is alert.  Skin: She is not diaphoretic.  Nursing note and vitals reviewed.    ED Treatments / Results  Labs (all labs ordered are listed, but only abnormal results are displayed) Labs Reviewed - No data to display  EKG  EKG Interpretation None       Radiology No results found.  Procedures Procedures (including critical care time)  Medications Ordered in  ED Medications  HYDROmorphone (DILAUDID) injection 1 mg (1 mg Intravenous Given 05/31/16 2010)  ketorolac (TORADOL) 30 MG/ML injection 30 mg (30 mg Intravenous Given 05/31/16 2009)     Initial Impression / Assessment and Plan / ED Course  I have reviewed the triage vital signs and the nursing notes.  Pertinent labs & imaging results that were available during my care of the patient were reviewed by me and considered in my medical decision making (see chart for details).  Clinical Course as of Jun 01 2031  Mon May 31, 2016  2029 Pt reports pain is improved, ready for d/c home.   [EW]    Clinical Course User Index [EW] Clayton Bibles, PA-C    Afebrile, nontoxic patient with chronic left hip pain with need for replacement, problems with health insurance currently - has appointment with orthopedist in the morning.  Delay in getting IV but pt wanted to wait for it, declined IM medications.  Pain much improved with IV dilaudid and toradol.  She has percocet at home - I have told her she can take up to 2 tabs Q 4 hours if needed to get through the night, currently taking 1 tab Q 6.   D/C home with orthopedic follow up in the morning.   Pt reports she has a walker, a wheelchair, and bedside commode, no other needs in the home.  Discussed result, findings, treatment, and follow up  with patient.  Pt given return precautions.  Pt verbalizes understanding and agrees with plan.       Final Clinical Impressions(s) / ED Diagnoses   Final diagnoses:  Chronic left hip pain    New Prescriptions New Prescriptions   No medications on file     Clayton Bibles, Hershal Coria 05/31/16 2033    Leo Grosser, MD 06/01/16 [phone]

## 2016-05-31 NOTE — ED Notes (Signed)
Spoke to [place] in minor care . sts since pt just had a full work up for similar pt 2 days ago . Pt will need social care consult .

## 2016-05-31 NOTE — ED Notes (Signed)
Bed: [facility] Expected date:  Expected time:  Means of arrival:  Comments: Patient is in this room

## 2016-06-03 ENCOUNTER — Ambulatory Visit (INDEPENDENT_AMBULATORY_CARE_PROVIDER_SITE_OTHER): Payer: Medicaid Other | Admitting: Internal Medicine

## 2016-06-03 DIAGNOSIS — M1612 Unilateral primary osteoarthritis, left hip: Secondary | ICD-10-CM

## 2016-06-03 DIAGNOSIS — M159 Polyosteoarthritis, unspecified: Secondary | ICD-10-CM

## 2016-06-03 DIAGNOSIS — Z96641 Presence of right artificial hip joint: Secondary | ICD-10-CM | POA: Diagnosis not present

## 2016-06-03 DIAGNOSIS — I10 Essential (primary) hypertension: Secondary | ICD-10-CM | POA: Diagnosis not present

## 2016-06-03 DIAGNOSIS — Z79899 Other long term (current) drug therapy: Secondary | ICD-10-CM

## 2016-06-03 DIAGNOSIS — M150 Primary generalized (osteo)arthritis: Principal | ICD-10-CM

## 2016-06-03 MED ORDER — OXYCODONE-ACETAMINOPHEN 5-325 MG PO TABS: 1.0000 | ORAL_TABLET | Freq: Four times a day (QID) | ORAL | 0 refills | Status: DC | PRN

## 2016-06-03 MED ORDER — IBUPROFEN 600 MG PO TABS: 600.0000 mg | ORAL_TABLET | Freq: Three times a day (TID) | ORAL | 11 refills | Status: DC | PRN

## 2016-06-03 MED ORDER — HYDROCHLOROTHIAZIDE 12.5 MG PO CAPS: 12.5000 mg | ORAL_CAPSULE | Freq: Every day | ORAL | 3 refills | Status: DC

## 2016-06-03 MED ORDER — OXYCODONE-ACETAMINOPHEN 5-325 MG PO TABS: 1.0000 | ORAL_TABLET | Freq: Two times a day (BID) | ORAL | 0 refills | Status: DC | PRN

## 2016-06-03 MED ORDER — METHOCARBAMOL 500 MG PO TABS: 500.0000 mg | ORAL_TABLET | Freq: Every day | ORAL | 0 refills | Status: DC | PRN

## 2016-06-03 NOTE — Assessment & Plan Note (Signed)
BP Readings from Last 3 Encounters:  06/03/16 (!) 144/82  05/31/16 144/80  05/29/16 145/96    Lab Results  Component Value Date   NA 138 04/19/2016   K 4.0 04/19/2016   CREATININE 0.80 04/19/2016    Assessment: Blood pressure control:  elevated Progress toward BP goal:   above goal Comments: Likely elevated in the setting of acute pain. She reports compliance with hydrochlorothiazide.  Plan: Medications:  continue current medications

## 2016-06-03 NOTE — Progress Notes (Signed)
    CC: Acute on chronic left hip pain  HPI: [name] is a 51 y.o. female with PMHx of asthma, hypertension who presents to the clinic for acute on chronic left hip pain.  Patient has a history of primary osteoarthritis of the right hip. She was admitted in August 2017 for unremitting pain and taken to the operating room for a total hip arthroplasty of the right hip. Since then, her right hip pain has been greatly improved; however, she's had worsening chronic left hip pain. She developed acute on chronic left hip pain and was seen in the emergency department 3 times last week. Each time, she was given IV pain medication and discharged home. CT of the left hip without contrast showed prominent severe degenerative changes in the left hip with severe narrowing of the superior acetabular joint and associated partial subluxation. She also has degenerative cysts and focal bone on bone phenomenon. Unfortunately, she was previously scheduled for a left hip replacement with orthopedics, but was unable to undergo surgery in January due to insurance issues. She has to wait until she meets her deductible. Patient previously received tramadol monthly prescriptions from our internal medicine clinic. When her chronic left hip pain suddenly worsened last week, she tried tramadol without relief. She went to the emergency department and was given a prescription for oxycodone which has helped. She admits to numbness and weakness in her left hip, but denies any urinary or bowel incontinence. She denies fevers or chills.  Past Medical History:  Diagnosis Date  . Adnexal cyst 2011   removed  . Anemia   . Arthritis    "neck; spine" (08/29/2014)  . Asthma   . Carpal tunnel syndrome of left wrist   . Chronic lower back pain   . Complication of anesthesia    difficult to awake  . Daily headache 12/2013   "tx'd and no problems anymore" (08/29/2014)  . Dyspnea    with asthma attack- none recent  . Family  history of adverse reaction to anesthesia    "1 sister and both parents hard to wake up"  . Heart murmur    "dx'd when I was a teenager; haven't had any problems w/it"  . Hip pain, right   . History of bronchitis   . Hypertension   . Urinary incontinence   . Urinary urgency   . UTI (lower urinary tract infection)      Review of Systems: Please see pertinent ROS reviewed in HPI and problem based charting.   Physical Exam: Vitals:   06/03/16 1536  BP: (!) 144/82  Pulse: 92  Temp: 98.2 F (36.8 C)  TempSrc: Oral  SpO2: 100%   General: Vital signs reviewed.  Patient is well-developed and well-nourished, in Mild acute distress and cooperative with exam.  Cardiovascular: RRR, S1 normal, S2 normal, no murmurs, gallops, or rubs. Pulmonary/Chest: Clear to auscultation bilaterally, no wheezes, rales, or rhonchi. Abdominal: Soft, non-tender, non-distended, BS + Musculoskeletal: Tender left hip and left lower extremity. Severe pain with movement. Extremities: 1+ lower extremity edema bilaterally,  pulses symmetric and intact bilaterally.  Neurological: Sensory intact to light touch bilaterally.  Skin: Warm, dry and intact. No rashes or erythema. Psychiatric: Normal mood and affect. speech and behavior is normal. Cognition and memory are normal.   Assessment & Plan:  See encounters tab for problem based medical decision making. Patient discussed with Dr. Evette Doffing

## 2016-06-03 NOTE — Assessment & Plan Note (Signed)
Patient has a history of primary osteoarthritis of the right hip. She was admitted in August 2017 for unremitting pain and taken to the operating room for a total hip arthroplasty of the right hip. Since then, her right hip pain has been greatly improved; however, she's had worsening chronic left hip pain. She developed acute on chronic left hip pain and was seen in the emergency department 3 times last week. Each time, she was given IV pain medication and discharged home. CT of the left hip without contrast showed prominent severe degenerative changes in the left hip with severe narrowing of the superior acetabular joint and associated partial subluxation. She also has degenerative cysts and focal bone on bone phenomenon. Unfortunately, she was previously scheduled for a left hip replacement with orthopedics, but was unable to undergo surgery in January due to insurance issues. She has to wait until she meets her deductible. Patient previously received tramadol monthly prescriptions from our internal medicine clinic. When her chronic left hip pain suddenly worsened last week, she tried tramadol without relief. She went to the emergency department and was given a prescription for oxycodone which has helped. She admits to numbness and weakness in her left hip, but denies any urinary or bowel incontinence. She denies fevers or chills. Patient is in mild acute pain on exam with severe pain with movement of her left hip.  Plan: -I think it's reasonable to prescribe oxycodone 5 mg-325 mg twice a day when necessary #60 without refills as her tramadol has been ineffective in the setting of acute on chronic pain -This is intended to be a bridge for therapy until patient is able to undergo the left hip replacement once her insurance deductible is met. -Follow-up in one month

## 2016-06-08 NOTE — Addendum Note (Signed)
Addended by: Lalla Brothers T on: 06/08/2016 09:48 AM   Modules accepted: Level of Service

## 2016-06-08 NOTE — Progress Notes (Signed)
Internal Medicine Clinic Attending  Case discussed with Dr. Burns at the time of the visit.  We reviewed the resident's history and exam and pertinent patient test results.  I agree with the assessment, diagnosis, and plan of care documented in the resident's note.  

## 2016-06-09 ENCOUNTER — Other Ambulatory Visit: Payer: Self-pay | Admitting: Internal Medicine

## 2016-06-10 NOTE — Telephone Encounter (Signed)
Hi Brandi Chase,  It appears that the patient was seen in the [facility] by Dr. Quay Burow for acute on chronic L hip pain.  She was given a prescription of OXYCODONE- ACETAMINOPHEN 5- 325 which is a new medication for the patient and was filled on 2/23.  Because of this change in medication she will not be receiving tramadol as she is now getting stronger narcotic.   She was told to follow up in the [facility] in one month.

## 2016-06-11 NOTE — Telephone Encounter (Signed)
Could you please route this conversation to Dr. Quay Burow as she was the one that assessed the patient and started the medication in [facility].  I do not know what they discussed in the office visit.  Per her note it does not suggest using both medications for her pain management.

## 2016-06-11 NOTE — Telephone Encounter (Signed)
Call from pt asking about Tramadol refill. Informed pt she was seen by Dr Quay Burow and was given rx for Oxycodone/ACET and" Because of this change in medication she will not be receiving tramadol as she is now getting stronger narcotic." per Dr Heber [place]. She said she was told to alternate Oxycodone with Tramadol - told her I did not see this in Dr Quay Burow' note.  Said taking 1 tab BID is not doing anything for her pain. Said she had taken Oxycodone 1 tab every 4 hrs as prescribed in ED. Said she is bed bound b/c of hip pain; unable to come for a f/u visit. Informed I will let her doctor know about  her concerns. And wants to know if her doctor would call her.

## 2016-06-14 NOTE — Telephone Encounter (Signed)
I did see this patient on 06/03/16. She does have severe OA of her hip and her hip replacement was cancelled last month due to insurance issues. After discussing with the attending, decision was made to start on Oxycodone-APAP given her severe pain. This will likely be a chronic medication until she is able to get her surgery. If her pain is uncontrolled, she may need an increase in Oxycodone as I prescribed her 1 pill BID. In order to prescribe this, she would need to come into the clinic for evaluation, but also to pick up the prescription (I gave her a one month supply of oxycodone on 2/22 based on taking the tablets twice a day). I don't think it's unreasonable to increase this dose, but she would need to come to the clinic regardless to get the prescription.   I am currently on Night float- Bonnita Nasuti or Holley Raring would one of you be willing to call the patient to tell her this. I understand she is in severe pain, but we will need to see her regardless.

## 2016-06-15 NOTE — Telephone Encounter (Signed)
-----   Message from Florinda Marker, MD sent at 06/14/2016 11:52 PM EST ----- Regarding: appointment If patient is willing, can we get her in to see her PCP on Thursday, March 8 at 3:30- Dr. Heber [place] has an open appointment.

## 2016-06-15 NOTE — Telephone Encounter (Signed)
Called pt - informed of Dr Quay Burow' response of need to come in to re-evaluate pain medication - stated she has been in the bed but will try to come Thursday the 8th to see Dr Heber [place].

## 2016-06-17 ENCOUNTER — Encounter: Payer: Self-pay | Admitting: Internal Medicine

## 2016-06-17 ENCOUNTER — Ambulatory Visit (INDEPENDENT_AMBULATORY_CARE_PROVIDER_SITE_OTHER): Payer: Medicaid Other | Admitting: Internal Medicine

## 2016-06-17 VITALS — BP 118/75 | HR 97 | Temp 97.9°F | Ht 62.0 in

## 2016-06-17 DIAGNOSIS — Z993 Dependence on wheelchair: Secondary | ICD-10-CM

## 2016-06-17 DIAGNOSIS — Z597 Insufficient social insurance and welfare support: Secondary | ICD-10-CM

## 2016-06-17 DIAGNOSIS — M1612 Unilateral primary osteoarthritis, left hip: Secondary | ICD-10-CM

## 2016-06-17 MED ORDER — OXYCODONE-ACETAMINOPHEN 5-325 MG PO TABS: 1.0000 | ORAL_TABLET | Freq: Three times a day (TID) | ORAL | 0 refills | Status: DC | PRN

## 2016-06-17 MED ORDER — IBUPROFEN 800 MG PO TABS: 800.0000 mg | ORAL_TABLET | Freq: Three times a day (TID) | ORAL | 2 refills | Status: DC | PRN

## 2016-06-17 NOTE — Patient Instructions (Signed)
Brandi Chase,  It was a pleasure seeing you today. During this visit we increased your pain medication. You have a 2 month prescription and he will need to follow-up in May to reassess your pain. Please increase your ibuprofen to 800 mg 3 times a day.

## 2016-06-20 DIAGNOSIS — M1612 Unilateral primary osteoarthritis, left hip: Secondary | ICD-10-CM | POA: Insufficient documentation

## 2016-06-20 NOTE — Progress Notes (Signed)
   CC: acute on chronic left hip pain  HPI:  [name] is a 51 y.o. with history noted below that presents to the Internal medicine clinic for acute on chronic left hip pain.  She was seen in the acute care clinic on 06/03/16 for this issue.  She was prescribed roxicet twice daily.  She states that this current dose is not managing her pain.  She states she takes 2 pills in the morning and 1 at night along with ibuprofen 600mg  once daily.  She states that this regimen helps reduce her pain.  Past Medical History:  Diagnosis Date  . Adnexal cyst 2011   removed  . Anemia   . Arthritis    "neck; spine" (08/29/2014)  . Asthma   . Carpal tunnel syndrome of left wrist   . Chronic lower back pain   . Complication of anesthesia    difficult to awake  . Daily headache 12/2013   "tx'd and no problems anymore" (08/29/2014)  . Dyspnea    with asthma attack- none recent  . Family history of adverse reaction to anesthesia    "1 sister and both parents hard to wake up"  . Heart murmur    "dx'd when I was a teenager; haven't had any problems w/it"  . Hip pain, right   . History of bronchitis   . Hypertension   . Urinary incontinence   . Urinary urgency   . UTI (lower urinary tract infection)     Review of Systems:  Review of Systems  Constitutional: Negative for chills and fever.  Respiratory: Negative for shortness of breath.   Cardiovascular: Negative for leg swelling.  Neurological: Negative for weakness.  All other systems reviewed and are negative.    Physical Exam:  Vitals:   06/17/16 1535  BP: 118/75  Pulse: 97  Temp: 97.9 F (36.6 C)  TempSrc: Oral  SpO2: 99%  Height: 5\' 2"  (1.575 m)   Physical Exam  Constitutional: She is oriented to person, place, and time.  Wheel chair bound, same as in previous visit in 2017  Cardiovascular: Normal rate, regular rhythm and normal heart sounds.  Exam reveals no gallop and no friction rub.   No murmur  heard. Pulmonary/Chest: Effort normal and breath sounds normal. No respiratory distress. She has no wheezes. She has no rales.  Musculoskeletal: She exhibits no edema.  Patient unable to stand  Generalized pain on anterior and lateral leg No point tenderness of greater trochanter  Neurological: She is alert and oriented to person, place, and time.  Skin: Skin is warm and dry.    Assessment & Plan:   See encounters tab for problem based medical decision making.   Patient discussed with Dr. Eppie Gibson

## 2016-06-20 NOTE — Assessment & Plan Note (Addendum)
Assessment:  osteoarthritis of left hip Patient was scheduled for left hip surgery in january 2018 however, due to insurance was unable to complete the surgery.  She states she is currently working on [employer] production planner and until then the surgery has been placed on hold. In office she signed a new pain contract for Roxicet #90 TID.  We discussed her goals on the pain medication which included being able to dress herself, shower and ambulate with a cane around the house.  It was clearly stated that this increase in medication was being made available until her surgery was completed.  Plan - Roxicet #90 TID - ibuprofen 800mg  TID - new pain contract with goals for ADLs

## 2016-06-21 NOTE — Progress Notes (Signed)
Case discussed with Dr. Ratliff-Hoffman at the time of the visit. We reviewed the resident's history and exam and pertinent patient test results. I agree with the assessment, diagnosis, and plan of care documented in the resident's note. 

## 2016-07-14 ENCOUNTER — Other Ambulatory Visit: Payer: Self-pay | Admitting: Orthopedic Surgery

## 2016-07-15 NOTE — Pre-Procedure Instructions (Signed)
Brandi Chase  07/15/2016      [facility] ([facility] [place] - [place] [address] [place] ([place]) [place] [address] Phone: [phone] Fax: [phone]    Your procedure is scheduled on April 9  Report to [facility] at [place].M.  Call this number if you have problems the morning of surgery:  [phone]   Remember:  Do not eat food or drink liquids after midnight.   Take these medicines the morning of surgery with A SIP OF WATER albuterol (PROVENTIL HFA;VENTOLIN HFA),  methocarbamol (ROBAXIN) , oxyCODONE-acetaminophen (ROXICET)  Take all other medications as prescribed except 7 days prior to surgery STOP taking any Aspirin, Aleve, Naproxen, Ibuprofen, Motrin, Advil, Goody's, BC's, all herbal medications, fish oil, and all vitamins   Do not wear jewelry, make-up or nail polish.  Do not wear lotions, powders, or perfumes, or deoderant.  Do not shave 48 hours prior to surgery.  Men may shave face and neck.  Do not bring valuables to the hospital.  [facility] is not responsible for any belongings or valuables.  Contacts, dentures or bridgework may not be worn into surgery.  Leave your suitcase in the car.  After surgery it may be brought to your room.  For patients admitted to the hospital, discharge time will be determined by your treatment team.  Patients discharged the day of surgery will not be allowed to drive home.    Special instructions:   [facility]- Preparing For Surgery  Before surgery, you can play an important role. Because skin is not sterile, your skin needs to be as free of germs as possible. You can reduce the number of germs on your skin by washing with CHG (chlorahexidine gluconate) Soap before surgery.  CHG is an antiseptic cleaner which kills germs and bonds with the skin to continue killing germs even after washing.  Please do not use if you have an allergy to CHG or antibacterial soaps. If your  skin becomes reddened/irritated stop using the CHG.  Do not shave (including legs and underarms) for at least 48 hours prior to first CHG shower. It is OK to shave your face.  Please follow these instructions carefully.   1. Shower the NIGHT BEFORE SURGERY and the MORNING OF SURGERY with CHG.   2. If you chose to wash your hair, wash your hair first as usual with your normal shampoo.  3. After you shampoo, rinse your hair and body thoroughly to remove the shampoo.  4. Use CHG as you would any other liquid soap. You can apply CHG directly to the skin and wash gently with a scrungie or a clean washcloth.   5. Apply the CHG Soap to your body ONLY FROM THE NECK DOWN.  Do not use on open wounds or open sores. Avoid contact with your eyes, ears, mouth and genitals (private parts). Wash genitals (private parts) with your normal soap.  6. Wash thoroughly, paying special attention to the area where your surgery will be performed.  7. Thoroughly rinse your body with warm water from the neck down.  8. DO NOT shower/wash with your normal soap after using and rinsing off the CHG Soap.  9. Pat yourself dry with a CLEAN TOWEL.   10. Wear CLEAN PAJAMAS   11. Place CLEAN SHEETS on your bed the night of your first shower and DO NOT SLEEP WITH PETS.    Day of Surgery: Do not apply  any deodorants/lotions. Please wear clean clothes to the hospital/surgery center.      Please read over the following fact sheets that you were given.

## 2016-07-16 ENCOUNTER — Encounter (HOSPITAL_COMMUNITY)
Admission: RE | Admit: 2016-07-16 | Discharge: 2016-07-16 | Disposition: A | Discharge status: Home / Self Care | Payer: Medicaid Other | Source: Ambulatory Visit | Attending: Orthopedic Surgery | Admitting: Orthopedic Surgery

## 2016-07-16 ENCOUNTER — Encounter (HOSPITAL_COMMUNITY): Payer: Self-pay

## 2016-07-16 DIAGNOSIS — Z01812 Encounter for preprocedural laboratory examination: Secondary | ICD-10-CM | POA: Diagnosis present

## 2016-07-16 LAB — CBC WITH DIFFERENTIAL/PLATELET
BASOS ABS: 0 10*3/uL (ref 0.0–0.1)
Basophils Relative: 0 %
Eosinophils Absolute: 0.1 10*3/uL (ref 0.0–0.7)
Eosinophils Relative: 2 %
HEMATOCRIT: 39.6 % (ref 36.0–46.0)
Hemoglobin: 12.7 g/dL (ref 12.0–15.0)
LYMPHS ABS: 2.7 10*3/uL (ref 0.7–4.0)
LYMPHS PCT: 36 %
MCH: 27.1 pg (ref 26.0–34.0)
MCHC: 32.1 g/dL (ref 30.0–36.0)
MCV: 84.4 fL (ref 78.0–100.0)
MONO ABS: 0.4 10*3/uL (ref 0.1–1.0)
Monocytes Relative: 5 %
NEUTROS ABS: 4.2 10*3/uL (ref 1.7–7.7)
Neutrophils Relative %: 57 %
Platelets: 309 10*3/uL (ref 150–400)
RBC: 4.69 MIL/uL (ref 3.87–5.11)
RDW: 15.5 % (ref 11.5–15.5)
WBC: 7.4 10*3/uL (ref 4.0–10.5)

## 2016-07-16 LAB — TYPE AND SCREEN
ABO/RH(D): O POS
ANTIBODY SCREEN: NEGATIVE

## 2016-07-16 LAB — SURGICAL PCR SCREEN
MRSA, PCR: NEGATIVE
Staphylococcus aureus: NEGATIVE

## 2016-07-16 LAB — URINALYSIS, ROUTINE W REFLEX MICROSCOPIC
Bilirubin Urine: NEGATIVE
GLUCOSE, UA: NEGATIVE mg/dL
Ketones, ur: NEGATIVE mg/dL
Leukocytes, UA: NEGATIVE
NITRITE: NEGATIVE
PH: 5 (ref 5.0–8.0)
Protein, ur: NEGATIVE mg/dL
SPECIFIC GRAVITY, URINE: 1.019 (ref 1.005–1.030)

## 2016-07-16 LAB — APTT: APTT: 31 s (ref 24–36)

## 2016-07-16 LAB — BASIC METABOLIC PANEL
ANION GAP: 11 (ref 5–15)
BUN: 12 mg/dL (ref 6–20)
CHLORIDE: 101 mmol/L (ref 101–111)
CO2: 26 mmol/L (ref 22–32)
Calcium: 9.6 mg/dL (ref 8.9–10.3)
Creatinine, Ser: 0.86 mg/dL (ref 0.44–1.00)
GFR calc Af Amer: >60 mL/min (ref >60)
GLUCOSE: 95 mg/dL (ref 65–99)
POTASSIUM: 3.1 mmol/L — AB (ref 3.5–5.1)
Sodium: 138 mmol/L (ref 135–145)

## 2016-07-16 LAB — PROTIME-INR
INR: 0.99
Prothrombin Time: 13.1 seconds (ref 11.4–15.2)

## 2016-07-16 LAB — HCG, SERUM, QUALITATIVE: PREG SERUM: NEGATIVE

## 2016-07-16 MED ORDER — TRANEXAMIC ACID 1000 MG/10ML IV SOLN
2000.0000 mg | INTRAVENOUS | Status: AC
  Administered 2016-07-19: 2000 mg via TOPICAL
  Filled 2016-07-16 (×2): qty 20

## 2016-07-16 MED ORDER — CEFAZOLIN SODIUM-DEXTROSE 2-4 GM/100ML-% IV SOLN
2.0000 g | INTRAVENOUS | Status: AC
  Administered 2016-07-19: 2 g via INTRAVENOUS
  Filled 2016-07-16: qty 100

## 2016-07-16 MED ORDER — DEXTROSE-NACL 5-0.45 % IV SOLN: INTRAVENOUS | Status: DC

## 2016-07-16 NOTE — Progress Notes (Signed)
PCP: Dr. Lizabeth Leyden @ [facility]

## 2016-07-17 NOTE — H&P (Signed)
TOTAL HIP ADMISSION H&P  Patient is admitted for left total hip arthroplasty.  Subjective:  Chief Complaint: left hip pain  HPI: Brandi Chase, 51 y.o. female, has a history of pain and functional disability in the left hip(s) due to arthritis and patient has failed non-surgical conservative treatments for greater than 12 weeks to include NSAID's and/or analgesics, flexibility and strengthening excercises, use of assistive devices, weight reduction as appropriate and activity modification.  Onset of symptoms was gradual starting 4 years ago with gradually worsening course since that time.The patient noted no past surgery on the left hip(s).  Patient currently rates pain in the left hip at 10 out of 10 with activity. Patient has night pain, worsening of pain with activity and weight bearing, pain that interfers with activities of daily living and pain with passive range of motion. Patient has evidence of joint space narrowing by imaging studies. This condition presents safety issues increasing the risk of falls.   There is no current active infection.  Patient Active Problem List   Diagnosis Date Noted  . Primary osteoarthritis of left hip 06/20/2016  . Opioid dependence ([facility]) 04/13/2016  . Localized osteoarthrosis of right hip 11/26/2015  . Primary localized osteoarthritis of right hip 11/23/2015  . Absolute anemia 11/13/2015  . Solar lentigo 11/13/2015  . Iron deficiency anemia due to chronic blood loss 08/15/2015  . DUB (dysfunctional uterine bleeding) 08/15/2015  . Uterus, adenomyosis 07/25/2015  . Dysuria 07/18/2015  . 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 Dysfunctional uterine bleeding 07/11/2015  . Essential hypertension 04/17/2015  . Healthcare maintenance 04/17/2015  . Back pain 08/29/2014  . H/O pelvic mass 05/24/2013  . Sore throat 12/11/2010  . Allergic rhinitis 12/11/2010  . SHOULDER PAIN, LEFT 03/05/2009  . Asthma 09/12/2008  . Primary osteoarthritis involving multiple  joints 09/12/2008   Past Medical History:  Diagnosis Date  . Adnexal cyst 2011   removed  . Anemia   . Arthritis    "neck; spine" (08/29/2014)  . Asthma   . Carpal tunnel syndrome of left wrist   . Chronic lower back pain   . Complication of anesthesia    difficult to awake  . Daily headache 12/2013   "tx'd and no problems anymore" (08/29/2014)  . Dyspnea    with asthma attack- none recent  . Family history of adverse reaction to anesthesia    "1 sister and both parents hard to wake up"  . Heart murmur    "dx'd when I was a teenager; haven't had any problems w/it"  . Hip pain, right   . History of bronchitis   . Hypertension   . Urinary incontinence   . Urinary urgency   . UTI (lower urinary tract infection)     Past Surgical History:  Procedure Laterality Date  . BACK SURGERY     x2  . JOINT REPLACEMENT Right 11/2015   hip  . OVARIAN CYST REMOVAL Right 2011  . REDUCTION MAMMAPLASTY Bilateral 1995  . 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  . 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   Procedure: TOTAL HIP ARTHROPLASTY ANTERIOR APPROACH;  Surgeon: Frederik Pear, MD;  Location: [facility];  Service: Orthopedics;  Laterality: Right;  . TRANSFORAMINAL LUMBAR INTERBODY FUSION (TLIF) WITH PEDICLE SCREW FIXATION 2 LEVEL N/A 08/29/2014   Procedure: TRANSFORAMINAL LUMBAR INTERBODY FUSION (TLIF) L4-S1;  Surgeon: Melina Schools, MD;  Location: [facility];  Service: Orthopedics;  Laterality: N/A;  . TUBAL LIGATION  1992    No prescriptions prior to admission.  Allergies  Allergen Reactions  . Other Anaphylaxis    ** PECANS **  . Primatene Mist [Epinephrine] Other (See Comments)    SYNCOPE > "BLACKED OUT"  . Shrimp [Shellfish Allergy] Anaphylaxis and Other (See Comments)    CAUSED VISION LOSS  . Aleve [Naproxen Sodium] Other (See Comments)    CHEST PAIN  . Bactrim Ds [Sulfamethoxazole W/Trimethoprim (Co-Trimoxazole)] Other (See Comments)    VISUAL HALLUCINATIONS   . Midol [Ibuprofen]  Anxiety    AGITATION.  [place] Kitchen Avocado Rash  . Cocoa Butter Itching  . Latex Itching    Social History  Substance Use Topics  . Smoking status: Never Smoker  . Smokeless tobacco: Never Used  . Alcohol use No    No family history on file.   Review of Systems  Constitutional: Negative.   HENT: Negative.   Eyes: Negative.   Respiratory: Negative.   Cardiovascular: Negative.   Gastrointestinal: Negative.   Genitourinary: Negative.   Musculoskeletal: Positive for joint pain and myalgias.  Skin: Negative.   Neurological: Negative.   Endo/Heme/Allergies: Negative.   Psychiatric/Behavioral: Negative.     Objective:  Physical Exam  Constitutional: She is oriented to person, place, and time. She appears well-developed and well-nourished.  HENT:  Head: Normocephalic and atraumatic.  Eyes: Pupils are equal, round, and reactive to light.  Neck: Normal range of motion. Neck supple.  Cardiovascular: Intact distal pulses.   Respiratory: Effort normal.  Musculoskeletal: She exhibits tenderness.  No tenderness palpation over the greater trochanter.  Weakness with hip flexion.  Weakness with knee extension and flexion.  Limitations with internal and external rotation.  Negative straight leg raise.  Neurovascular intact.  Neurological: She is alert and oriented to person, place, and time.  Skin: Skin is warm and dry.  Psychiatric: She has a normal mood and affect. Her behavior is normal. Judgment and thought content normal.    Vital signs in last 24 hours:    Labs:   Estimated body mass index is 37.93 kg/m as calculated from the following:   Height as of 07/16/16: 5\' 2"  (1.575 m).   Weight as of 07/16/16: 94.1 kg (207 lb 6.4 oz).   Imaging Review Plain radiographs demonstrate degenerative changes of her left hip.  Assessment/Plan:  End stage arthritis, left hip(s)  The patient history, physical examination, clinical judgement of the provider and imaging studies are consistent with  end stage degenerative joint disease of the left hip(s) and total hip arthroplasty is deemed medically necessary. The treatment options including medical management, injection therapy, arthroscopy and arthroplasty were discussed at length. The risks and benefits of total hip arthroplasty were presented and reviewed. The risks due to aseptic loosening, infection, stiffness, dislocation/subluxation,  thromboembolic complications and other imponderables were discussed.  The patient acknowledged the explanation, agreed to proceed with the plan and consent was signed. Patient is being admitted for inpatient treatment for surgery, pain control, PT, OT, prophylactic antibiotics, VTE prophylaxis, progressive ambulation and ADL's and discharge planning.The patient is planning to be discharged home with home health services

## 2016-07-18 MED ORDER — BUPIVACAINE LIPOSOME 1.3 % IJ SUSP
20.0000 mL | Freq: Once | INTRAMUSCULAR | Status: AC
  Administered 2016-07-19: 20 mL
  Filled 2016-07-18: qty 20

## 2016-07-18 MED ORDER — SODIUM CHLORIDE 0.9 % IV SOLN
1000.0000 mg | INTRAVENOUS | Status: AC
  Administered 2016-07-19: 1000 mg via INTRAVENOUS
  Filled 2016-07-18: qty 10

## 2016-07-18 NOTE — Anesthesia Preprocedure Evaluation (Signed)
Anesthesia Evaluation  Patient identified by MRN, date of birth, ID band Patient awake    Reviewed: Allergy & Precautions, H&P , Patient's Chart, lab work & pertinent test results  Airway Mallampati: II  TM Distance: >3 FB Neck ROM: full    Dental no notable dental hx.    Pulmonary    Pulmonary exam normal breath sounds clear to auscultation       Cardiovascular Exercise Tolerance: Good hypertension,  Rhythm:regular Rate:Normal     Neuro/Psych    GI/Hepatic   Endo/Other    Renal/GU      Musculoskeletal   Abdominal   Peds  Hematology   Anesthesia Other Findings   Reproductive/Obstetrics                             Anesthesia Physical Anesthesia Plan  ASA: III  Anesthesia Plan: Spinal   Post-op Pain Management:    Induction:   Airway Management Planned:   Additional Equipment:   Intra-op Plan:   Post-operative Plan:   Informed Consent: I have reviewed the patients History and Physical, chart, labs and discussed the procedure including the risks, benefits and alternatives for the proposed anesthesia with the patient or authorized representative who has indicated his/her understanding and acceptance.     Plan Discussed with:   Anesthesia Plan Comments: (  )        Anesthesia Quick Evaluation

## 2016-07-19 ENCOUNTER — Inpatient Hospital Stay (HOSPITAL_COMMUNITY): Payer: Medicaid Other

## 2016-07-19 ENCOUNTER — Encounter (HOSPITAL_COMMUNITY): Payer: Self-pay | Admitting: *Deleted

## 2016-07-19 ENCOUNTER — Inpatient Hospital Stay (HOSPITAL_COMMUNITY)
Admission: RE | Admit: 2016-07-19 | Discharge: 2016-07-21 | DRG: 470 | Disposition: A | Discharge status: Home Health | Payer: Medicaid Other | Source: Ambulatory Visit | Attending: Orthopedic Surgery | Admitting: Orthopedic Surgery

## 2016-07-19 ENCOUNTER — Inpatient Hospital Stay (HOSPITAL_COMMUNITY): Payer: Medicaid Other | Admitting: Anesthesiology

## 2016-07-19 ENCOUNTER — Encounter (HOSPITAL_COMMUNITY)
Admission: RE | Disposition: A | Discharge status: Home Health | Payer: Self-pay | Source: Ambulatory Visit | Attending: Orthopedic Surgery

## 2016-07-19 DIAGNOSIS — I10 Essential (primary) hypertension: Secondary | ICD-10-CM | POA: Diagnosis present

## 2016-07-19 DIAGNOSIS — J45909 Unspecified asthma, uncomplicated: Secondary | ICD-10-CM | POA: Diagnosis present

## 2016-07-19 DIAGNOSIS — M1612 Unilateral primary osteoarthritis, left hip: Secondary | ICD-10-CM | POA: Diagnosis not present

## 2016-07-19 DIAGNOSIS — Z96643 Presence of artificial hip joint, bilateral: Secondary | ICD-10-CM

## 2016-07-19 DIAGNOSIS — Z419 Encounter for procedure for purposes other than remedying health state, unspecified: Secondary | ICD-10-CM

## 2016-07-19 DIAGNOSIS — Z96641 Presence of right artificial hip joint: Secondary | ICD-10-CM | POA: Diagnosis present

## 2016-07-19 DIAGNOSIS — M25552 Pain in left hip: Secondary | ICD-10-CM | POA: Diagnosis present

## 2016-07-19 HISTORY — PX: TOTAL HIP ARTHROPLASTY: SHX124

## 2016-07-19 SURGERY — ARTHROPLASTY, HIP, TOTAL, ANTERIOR APPROACH
Anesthesia: Spinal | Site: Hip | Laterality: Left

## 2016-07-19 MED ORDER — FENTANYL CITRATE (PF) 100 MCG/2ML IJ SOLN
INTRAMUSCULAR | Status: AC
  Filled 2016-07-19: qty 2

## 2016-07-19 MED ORDER — OXYCODONE HCL 5 MG PO TABS
ORAL_TABLET | ORAL | Status: AC
  Filled 2016-07-19: qty 2

## 2016-07-19 MED ORDER — MENTHOL 3 MG MT LOZG: 1.0000 | LOZENGE | OROMUCOSAL | Status: DC | PRN

## 2016-07-19 MED ORDER — ALUMINUM HYDROXIDE GEL 320 MG/5ML PO SUSP
15.0000 mL | ORAL | Status: DC | PRN
  Filled 2016-07-19: qty 30

## 2016-07-19 MED ORDER — DOCUSATE SODIUM 100 MG PO CAPS
100.0000 mg | ORAL_CAPSULE | Freq: Two times a day (BID) | ORAL | Status: DC
  Administered 2016-07-20 – 2016-07-21 (×3): 100 mg via ORAL
  Filled 2016-07-19 (×5): qty 1

## 2016-07-19 MED ORDER — ACETAMINOPHEN 325 MG PO TABS
650.0000 mg | ORAL_TABLET | Freq: Four times a day (QID) | ORAL | Status: DC | PRN
  Administered 2016-07-20: 650 mg via ORAL
  Filled 2016-07-19: qty 2

## 2016-07-19 MED ORDER — ASPIRIN EC 325 MG PO TBEC: 325.0000 mg | DELAYED_RELEASE_TABLET | Freq: Two times a day (BID) | ORAL | 0 refills | Status: DC

## 2016-07-19 MED ORDER — KCL IN DEXTROSE-NACL 20-5-0.45 MEQ/L-%-% IV SOLN: INTRAVENOUS | Status: DC

## 2016-07-19 MED ORDER — HYDROMORPHONE HCL 1 MG/ML IJ SOLN
0.5000 mg | INTRAMUSCULAR | Status: DC | PRN
  Administered 2016-07-19: 1 mg via INTRAVENOUS
  Filled 2016-07-19: qty 1

## 2016-07-19 MED ORDER — EPHEDRINE 5 MG/ML INJ
INTRAVENOUS | Status: AC
  Filled 2016-07-19: qty 10

## 2016-07-19 MED ORDER — TIZANIDINE HCL 2 MG PO TABS: 2.0000 mg | ORAL_TABLET | Freq: Four times a day (QID) | ORAL | 0 refills | Status: DC | PRN

## 2016-07-19 MED ORDER — ASPIRIN EC 325 MG PO TBEC
325.0000 mg | DELAYED_RELEASE_TABLET | Freq: Every day | ORAL | Status: DC
  Administered 2016-07-20 – 2016-07-21 (×2): 325 mg via ORAL
  Filled 2016-07-19 (×2): qty 1

## 2016-07-19 MED ORDER — ONDANSETRON HCL 4 MG PO TABS: 4.0000 mg | ORAL_TABLET | Freq: Four times a day (QID) | ORAL | Status: DC | PRN

## 2016-07-19 MED ORDER — CHLORHEXIDINE GLUCONATE 4 % EX LIQD: 60.0000 mL | Freq: Once | CUTANEOUS | Status: DC

## 2016-07-19 MED ORDER — POLYETHYLENE GLYCOL 3350 17 G PO PACK: 17.0000 g | PACK | Freq: Every day | ORAL | Status: DC | PRN

## 2016-07-19 MED ORDER — BISACODYL 5 MG PO TBEC: 5.0000 mg | DELAYED_RELEASE_TABLET | Freq: Every day | ORAL | Status: DC | PRN

## 2016-07-19 MED ORDER — PHENOL 1.4 % MT LIQD
1.0000 | OROMUCOSAL | Status: DC | PRN
Start: 2016-07-19 — End: 2016-07-21

## 2016-07-19 MED ORDER — METOCLOPRAMIDE HCL 5 MG PO TABS: 5.0000 mg | ORAL_TABLET | Freq: Three times a day (TID) | ORAL | Status: DC | PRN

## 2016-07-19 MED ORDER — DIPHENHYDRAMINE HCL 12.5 MG/5ML PO ELIX: 12.5000 mg | ORAL_SOLUTION | ORAL | Status: DC | PRN

## 2016-07-19 MED ORDER — PROPOFOL 500 MG/50ML IV EMUL
INTRAVENOUS | Status: DC | PRN
  Administered 2016-07-19: 75 ug/kg/min via INTRAVENOUS

## 2016-07-19 MED ORDER — MIDAZOLAM HCL 2 MG/2ML IJ SOLN
INTRAMUSCULAR | Status: AC
  Filled 2016-07-19: qty 2

## 2016-07-19 MED ORDER — OXYCODONE-ACETAMINOPHEN 5-325 MG PO TABS: 1.0000 | ORAL_TABLET | ORAL | 0 refills | Status: DC | PRN

## 2016-07-19 MED ORDER — PHENYLEPHRINE HCL 10 MG/ML IJ SOLN
INTRAMUSCULAR | Status: DC | PRN
  Administered 2016-07-19: 25 ug/min via INTRAVENOUS

## 2016-07-19 MED ORDER — PROPOFOL 1000 MG/100ML IV EMUL
INTRAVENOUS | Status: AC
  Filled 2016-07-19: qty 200

## 2016-07-19 MED ORDER — ALBUTEROL SULFATE (2.5 MG/3ML) 0.083% IN NEBU: 3.0000 mL | INHALATION_SOLUTION | Freq: Four times a day (QID) | RESPIRATORY_TRACT | Status: DC | PRN

## 2016-07-19 MED ORDER — BUPIVACAINE HCL (PF) 0.5 % IJ SOLN
INTRAMUSCULAR | Status: DC | PRN
  Administered 2016-07-19: 50 mL

## 2016-07-19 MED ORDER — ONDANSETRON HCL 4 MG/2ML IJ SOLN: 4.0000 mg | Freq: Four times a day (QID) | INTRAMUSCULAR | Status: DC | PRN

## 2016-07-19 MED ORDER — LIDOCAINE 2% (20 MG/ML) 5 ML SYRINGE
INTRAMUSCULAR | Status: AC
  Filled 2016-07-19: qty 5

## 2016-07-19 MED ORDER — SUCCINYLCHOLINE CHLORIDE 200 MG/10ML IV SOSY
PREFILLED_SYRINGE | INTRAVENOUS | Status: AC
  Filled 2016-07-19: qty 10

## 2016-07-19 MED ORDER — FLEET ENEMA 7-19 GM/118ML RE ENEM: 1.0000 | ENEMA | Freq: Once | RECTAL | Status: DC | PRN

## 2016-07-19 MED ORDER — HYDROCHLOROTHIAZIDE 12.5 MG PO CAPS
12.5000 mg | ORAL_CAPSULE | Freq: Every day | ORAL | Status: DC
  Administered 2016-07-20 – 2016-07-21 (×2): 12.5 mg via ORAL
  Filled 2016-07-19 (×2): qty 1

## 2016-07-19 MED ORDER — BUPIVACAINE HCL (PF) 0.5 % IJ SOLN
INTRAMUSCULAR | Status: DC | PRN
  Administered 2016-07-19: 2.2 mL via INTRATHECAL

## 2016-07-19 MED ORDER — PROPOFOL 10 MG/ML IV BOLUS
INTRAVENOUS | Status: AC
  Filled 2016-07-19: qty 20

## 2016-07-19 MED ORDER — METHOCARBAMOL 500 MG PO TABS
ORAL_TABLET | ORAL | Status: AC
  Filled 2016-07-19: qty 1

## 2016-07-19 MED ORDER — DEXAMETHASONE SODIUM PHOSPHATE 10 MG/ML IJ SOLN
10.0000 mg | Freq: Once | INTRAMUSCULAR | Status: AC
  Administered 2016-07-20: 10 mg via INTRAVENOUS
  Filled 2016-07-19 (×2): qty 1

## 2016-07-19 MED ORDER — GABAPENTIN 300 MG PO CAPS
300.0000 mg | ORAL_CAPSULE | Freq: Three times a day (TID) | ORAL | Status: DC
  Administered 2016-07-19 – 2016-07-21 (×7): 300 mg via ORAL
  Filled 2016-07-19 (×8): qty 1

## 2016-07-19 MED ORDER — BUPIVACAINE HCL (PF) 0.5 % IJ SOLN
INTRAMUSCULAR | Status: AC
  Filled 2016-07-19: qty 30

## 2016-07-19 MED ORDER — 0.9 % SODIUM CHLORIDE (POUR BTL) OPTIME
TOPICAL | Status: DC | PRN
  Administered 2016-07-19: 1000 mL

## 2016-07-19 MED ORDER — ACETAMINOPHEN 650 MG RE SUPP: 650.0000 mg | Freq: Four times a day (QID) | RECTAL | Status: DC | PRN

## 2016-07-19 MED ORDER — TRANEXAMIC ACID 1000 MG/10ML IV SOLN
1000.0000 mg | Freq: Once | INTRAVENOUS | Status: DC
  Filled 2016-07-19: qty 10

## 2016-07-19 MED ORDER — BUPIVACAINE HCL (PF) 0.25 % IJ SOLN
INTRAMUSCULAR | Status: AC
  Filled 2016-07-19: qty 30

## 2016-07-19 MED ORDER — METHOCARBAMOL 500 MG PO TABS
500.0000 mg | ORAL_TABLET | Freq: Four times a day (QID) | ORAL | Status: DC | PRN
  Administered 2016-07-19 – 2016-07-21 (×4): 500 mg via ORAL
  Filled 2016-07-19 (×3): qty 1

## 2016-07-19 MED ORDER — OXYCODONE HCL 5 MG PO TABS
5.0000 mg | ORAL_TABLET | ORAL | Status: DC | PRN
  Administered 2016-07-19 – 2016-07-21 (×7): 10 mg via ORAL
  Filled 2016-07-19: qty 1
  Filled 2016-07-19 (×4): qty 2
  Filled 2016-07-19: qty 1
  Filled 2016-07-19: qty 2

## 2016-07-19 MED ORDER — METHOCARBAMOL 1000 MG/10ML IJ SOLN: 500.0000 mg | Freq: Four times a day (QID) | INTRAVENOUS | Status: DC | PRN

## 2016-07-19 MED ORDER — MIDAZOLAM HCL 5 MG/5ML IJ SOLN
INTRAMUSCULAR | Status: DC | PRN
  Administered 2016-07-19: 2 mg via INTRAVENOUS

## 2016-07-19 MED ORDER — FENTANYL CITRATE (PF) 100 MCG/2ML IJ SOLN
25.0000 ug | INTRAMUSCULAR | Status: DC | PRN
Start: 2016-07-19 — End: 2016-07-19
  Administered 2016-07-19: 50 ug via INTRAVENOUS

## 2016-07-19 MED ORDER — ONDANSETRON HCL 4 MG/2ML IJ SOLN
INTRAMUSCULAR | Status: AC
  Filled 2016-07-19: qty 2

## 2016-07-19 MED ORDER — LACTATED RINGERS IV SOLN
INTRAVENOUS | Status: DC | PRN
  Administered 2016-07-19 (×2): via INTRAVENOUS

## 2016-07-19 MED ORDER — ONDANSETRON HCL 4 MG/2ML IJ SOLN
INTRAMUSCULAR | Status: DC | PRN
  Administered 2016-07-19: 4 mg via INTRAVENOUS

## 2016-07-19 MED ORDER — PHENYLEPHRINE 40 MCG/ML (10ML) SYRINGE FOR IV PUSH (FOR BLOOD PRESSURE SUPPORT)
PREFILLED_SYRINGE | INTRAVENOUS | Status: AC
  Filled 2016-07-19: qty 10

## 2016-07-19 MED ORDER — FENTANYL CITRATE (PF) 250 MCG/5ML IJ SOLN
INTRAMUSCULAR | Status: AC
  Filled 2016-07-19: qty 5

## 2016-07-19 MED ORDER — FENTANYL CITRATE (PF) 100 MCG/2ML IJ SOLN
INTRAMUSCULAR | Status: DC | PRN
  Administered 2016-07-19: 50 ug via INTRAVENOUS

## 2016-07-19 MED ORDER — METOCLOPRAMIDE HCL 5 MG/ML IJ SOLN: 5.0000 mg | Freq: Three times a day (TID) | INTRAMUSCULAR | Status: DC | PRN

## 2016-07-19 MED ORDER — BUPIVACAINE HCL (PF) 0.5 % IJ SOLN
INTRAMUSCULAR | Status: AC
  Filled 2016-07-19: qty 60

## 2016-07-19 SURGICAL SUPPLY — 46 items
BAG DECANTER FOR FLEXI CONT (MISCELLANEOUS) ×2 IMPLANT
BLADE SAW SGTL 18X1.27X75 (BLADE) ×2 IMPLANT
CAPT HIP TOTAL 2 ×1 IMPLANT
COVER PERINEAL POST (MISCELLANEOUS) ×2 IMPLANT
COVER SURGICAL LIGHT HANDLE (MISCELLANEOUS) ×3 IMPLANT
DRAPE C-ARM 42X72 X-RAY (DRAPES) ×2 IMPLANT
DRAPE STERI IOBAN 125X83 (DRAPES) ×2 IMPLANT
DRAPE U-SHAPE 47X51 STRL (DRAPES) ×4 IMPLANT
DRSG AQUACEL AG ADV 3.5X10 (GAUZE/BANDAGES/DRESSINGS) ×2 IMPLANT
DURAPREP 26ML APPLICATOR (WOUND CARE) ×2 IMPLANT
ELECT BLADE 4.0 EZ CLEAN MEGAD (MISCELLANEOUS) ×2
ELECT REM PT RETURN 9FT ADLT (ELECTROSURGICAL) ×2
ELECTRODE BLDE 4.0 EZ CLN MEGD (MISCELLANEOUS) ×1 IMPLANT
ELECTRODE REM PT RTRN 9FT ADLT (ELECTROSURGICAL) ×1 IMPLANT
FACESHIELD WRAPAROUND (MASK) ×4 IMPLANT
FACESHIELD WRAPAROUND OR TEAM (MASK) ×2 IMPLANT
GLOVE BIO SURGEON STRL SZ7.5 (GLOVE) ×1 IMPLANT
GLOVE BIO SURGEON STRL SZ8.5 (GLOVE) ×1 IMPLANT
GLOVE BIOGEL PI IND STRL 8 (GLOVE) ×1 IMPLANT
GLOVE BIOGEL PI IND STRL 9 (GLOVE) ×1 IMPLANT
GLOVE BIOGEL PI INDICATOR 8 (GLOVE) ×1
GLOVE BIOGEL PI INDICATOR 9 (GLOVE) ×1
GOWN STRL REUS W/ TWL LRG LVL3 (GOWN DISPOSABLE) ×1 IMPLANT
GOWN STRL REUS W/ TWL XL LVL3 (GOWN DISPOSABLE) ×2 IMPLANT
GOWN STRL REUS W/TWL LRG LVL3 (GOWN DISPOSABLE) ×2
GOWN STRL REUS W/TWL XL LVL3 (GOWN DISPOSABLE) ×4
KIT BASIN OR (CUSTOM PROCEDURE TRAY) ×2 IMPLANT
KIT ROOM TURNOVER OR (KITS) ×2 IMPLANT
MANIFOLD NEPTUNE II (INSTRUMENTS) ×2 IMPLANT
NDL 22X1.5 STRL (OR ONLY) (MISCELLANEOUS) ×2 IMPLANT
NEEDLE 22X1 1/2 OR ONLY (MISCELLANEOUS) ×2
NEEDLE 22X1.5 STRL (OR ONLY) (MISCELLANEOUS) ×2 IMPLANT
NS IRRIG 1000ML POUR BTL (IV SOLUTION) ×2 IMPLANT
PACK TOTAL JOINT (CUSTOM PROCEDURE TRAY) ×2 IMPLANT
PAD ARMBOARD 7.5X6 YLW CONV (MISCELLANEOUS) ×4 IMPLANT
SUT ETHIBOND NAB CT1 #1 30IN (SUTURE) ×2 IMPLANT
SUT VIC AB 1 CTX 36 (SUTURE) ×2
SUT VIC AB 1 CTX36XBRD ANBCTR (SUTURE) ×1 IMPLANT
SUT VIC AB 2-0 CT1 27 (SUTURE) ×2
SUT VIC AB 2-0 CT1 TAPERPNT 27 (SUTURE) ×1 IMPLANT
SUT VIC AB 3-0 PS2 18 (SUTURE) ×2
SUT VIC AB 3-0 PS2 18XBRD (SUTURE) ×1 IMPLANT
SYR CONTROL 10ML LL (SYRINGE) ×4 IMPLANT
TOWEL OR 17X24 6PK STRL BLUE (TOWEL DISPOSABLE) ×3 IMPLANT
TOWEL OR 17X26 10 PK STRL BLUE (TOWEL DISPOSABLE) ×2 IMPLANT
TRAY CATH 16FR W/PLASTIC CATH (SET/KITS/TRAYS/PACK) ×2 IMPLANT

## 2016-07-19 NOTE — Transfer of Care (Signed)
Immediate Anesthesia Transfer of Care Note  Patient: Brandi Chase  Procedure(s) Performed: Procedure(s): TOTAL HIP ARTHROPLASTY ANTERIOR APPROACH (Left)  Patient Location: PACU  Anesthesia Type:Spinal  Level of Consciousness: awake  Airway & Oxygen Therapy: Patient Spontanous Breathing and Patient connected to face mask oxygen  Post-op Assessment: Report given to RN, Post -op Vital signs reviewed and stable and Patient moving all extremities  Post vital signs: Reviewed and stable  Last Vitals:  Vitals:   07/19/16 0619  BP: 123/81  Pulse: 78  Resp: 18  Temp: 36.6 C    Last Pain:  Vitals:   07/19/16 0619  TempSrc: Oral  PainSc:       Patients Stated Pain Goal: 6 (67/01/41 0301)  Complications: No apparent anesthesia complications

## 2016-07-19 NOTE — Progress Notes (Signed)
PT Cancellation Note  Patient Details Name: Brandi Chase MRN: [id number] DOB: 09-24-1965   Cancelled Treatment:    Reason Eval/Treat Not Completed: Pain limiting ability to participate.  Pt refusing due to pain.  Wants to wait until after pain meds kick in (RN made aware that she wanted pain meds).  PT to check back later today as time allows.    Thanks,    Barbarann Ehlers. [place], [place], DPT [phone]   07/19/2016, 4:00 PM

## 2016-07-19 NOTE — Anesthesia Procedure Notes (Signed)
Spinal  Patient location during procedure: OR Staffing Anesthesiologist: Lyndle Herrlich Preanesthetic Checklist Completed: patient identified, site marked, surgical consent, pre-op evaluation, timeout performed, IV checked, risks and benefits discussed and monitors and equipment checked Spinal Block Patient position: sitting Prep: DuraPrep Patient monitoring: heart rate, cardiac monitor, continuous pulse ox and blood pressure Approach: midline Location: L3-4 Injection technique: single-shot Needle Needle type: Sprotte  Needle gauge: 24 G Needle length: 9 cm Assessment Sensory level: T4 Additional Notes Spinal Dosage in OR  .5% Bupivicaine ml       2.2

## 2016-07-19 NOTE — Anesthesia Procedure Notes (Signed)
Date/Time: 07/19/2016 7:55 AM Performed by: Izora Gala Pre-anesthesia Checklist: Patient identified, Emergency Drugs available and Suction available Patient Re-evaluated:Patient Re-evaluated prior to inductionOxygen Delivery Method: Nasal cannula Placement Confirmation: positive ETCO2

## 2016-07-19 NOTE — Interval H&P Note (Signed)
History and Physical Interval Note:  07/19/2016 7:14 AM  Brandi Chase  has presented today for surgery, with the diagnosis of LEFT HIP OSTEOARTHRITIS  The various methods of treatment have been discussed with the patient and family. After consideration of risks, benefits and other options for treatment, the patient has consented to  Procedure(s): TOTAL HIP ARTHROPLASTY ANTERIOR APPROACH (Left) as a surgical intervention .  The patient's history has been reviewed, patient examined, no change in status, stable for surgery.  I have reviewed the patient's chart and labs.  Questions were answered to the patient's satisfaction.     Kerin Salen

## 2016-07-19 NOTE — Progress Notes (Signed)
notifed dr Glennon Mac pt had 2 loose stools pt took laxative Friday and sat no new orders obtained

## 2016-07-19 NOTE — Op Note (Signed)
OPERATIVE REPORT    DATE OF PROCEDURE:  07/19/2016       PREOPERATIVE DIAGNOSIS:  LEFT HIP OSTEOARTHRITIS                                                          POSTOPERATIVE DIAGNOSIS:  LEFT HIP OSTEOARTHRITIS                                                           PROCEDURE: Anterior L total hip arthroplasty using a 48 mm DePuy Pinnacle  Cup, [employer], 0-degree polyethylene liner, a ++1 32 mm ceramic head, a 0 Depuy Triloc stem   SURGEON: Frederik Pear Chase    ASSISTANT:   [employer]  (present throughout entire procedure and necessary for timely completion of the procedure)   ANESTHESIA: Spinal BLOOD LOSS: 300 FLUID REPLACEMENT: 1500 crystalloid Antibiotic: 2gm ancef Tranexamic Acid: 1gm iv, 2gm topical COMPLICATIONS: none    INDICATIONS FOR PROCEDURE: A 51 y.o. year-old With  LEFT HIP OSTEOARTHRITIS   for 3 years, x-rays show bone-on-bone arthritic changes, and osteophytes. Despite conservative measures with observation, anti-inflammatory medicine, narcotics, use of a cane, has severe unremitting pain and can ambulate only a few blocks before resting. Patient desires elective L total hip arthroplasty to decrease pain and increase function. The risks, benefits, and alternatives were discussed at length including but not limited to the risks of infection, bleeding, nerve injury, stiffness, blood clots, the need for revision surgery, cardiopulmonary complications, among others, and they were willing to proceed. Questions answered     PROCEDURE IN DETAIL: The patient was identified by armband,  received preoperative IV antibiotics in the holding area at [facility], taken to the operating room , appropriate anesthetic monitors  were attached and  anesthesia was induced with the patienton the gurney. The HANA boots were applied to the feet and he was then transferred to the HANA table with a peroneal post and support underneath the non-operative le, which  was locked in 5 lb traction. Theoperative lower extremity was then prepped and draped in the usual sterile fashion from just above the iliac crest to the knee. And a timeout procedure was performed. We then made a 12 cm incision along the interval at the leading edge of the tensor fascia lata of starting at 2 cm lateral to and 2 cm distal to the ASIS. Small bleeders in the skin and subcutaneous tissue identified and cauterized we dissected down to the fascia and made an incision in the fascia allowing [place] to elevate the fascia of the tensor muscle and exploited the interval between the rectus and the tensor fascia lata. A Hohmann retractor was then placed along the superior neck of the femur and a Cobra retractor along the inferior neck of the femur we teed the capsule starting out at the superior anterior aspect of the acetabulum going distally and made the T along the neck both leaflets of the T were tagged with #2 Ethibond suture. Cobra retractors were then placed along the inferior and superior neck allowing [place] to perform a standard neck cut and removed  the femoral head with a power corkscrew. We then placed a right angle Hohmann retractor along the anterior aspect of the acetabulum a spiked Cobra in the cotyloid notch and posteriorly a Muelller retractor. We then sequentially reamed up to a 47 mm basket reamer obtaining good coverage in all quadrants, verified by C-arm imaging. Under C-arm control with and hammered into place a 48 mm Pinnacle cup in 45 of abduction and 15 of anteversion. The cup seated nicely and required no supplemental screws. We then placed a central hole Eliminator and a 0 polyethylene liner. The foot was then externally rotated to 110, the HANA elevator was placed around the flare of the greater trochanter and the limb was extended and abducted delivering the proximal femur up into the wound. A medium Hohmann retractor was placed over the greater trochanter and a Mueller retractor along  the posterior femoral neck completing the exposure. We then performed releases superiorly and and inferiorly of the capsule going back to the pirformis fossa superiorly and to the lesser trochanter inferiorly. We then entered the proximal femur with the box cutting offset chisel followed by, a canal sounder, the chili pepper and broaching up to a 0 broach. This seated nicely and we reamed the calcar. A trial reduction was performed with a 1.5 mm 35mm mm head.The limb lengths were excellent the hip was stable in 90 of external rotation. At this point the trial components removed and we hammered into place a # 0 Tri-Lock stem with Gryption coating. This was a std offset stem and a + 1 32 mm ceramic ball was then hammered into place the hip was reduced and final C-arm images obtained. The wound was thoroughly irrigated with normal saline solution. We repaired the ant capsule and the tensor fascia lot a with running 0 vicryl suture. the subcutaneous tissue was closed with 2-0 and 3-0 Vicryl suture followed by an Aquacil dressing. At this point the patient was awaken and transferred to hospital gurney without difficulty. The subcutaneous tissue with 0 and 2-0 undyed Vicryl suture and the skin with running  3-0 vicryl subcuticular suture. Aquacil dressing was applied. The patient was then unclamped, rolled supine, awaken extubated and taken to recovery room without difficulty in stable condition.   Brandi Chase 07/19/2016, 9:11 AM

## 2016-07-19 NOTE — Evaluation (Signed)
Physical Therapy Evaluation Patient Details Name: Brandi Chase MRN: [id number] DOB: 1965-05-22 Today's Date: 07/19/2016   History of Present Illness  51 y.o. female admitted to [facility] on 07/19/16 for elective L direct ant THA.  Pt with significant PMHx of HTN, dyspnea, chronic low back pain, L carpal tunnel syndrome, anemia, R THA (11/2015), and TLIF of L4-S1.    Clinical Impression  Pt is POD #0 and is limited by lightheadedness once we got to the chair (see BPs below).  I did not leave her up as her BPs did not stabilize and she continued to be symptomatic.  She was min assist overall to transfer OOB to recliner, to [facility], and back to bed.  I anticipate she will progress well enough to d/c home with [facility] services and family's assist.   PT to follow acutely for deficits listed below.       Follow Up Recommendations Home health PT;Supervision for mobility/OOB    Equipment Recommendations  None recommended by PT    Recommendations for Other Services   NA    Precautions / Restrictions Precautions Precautions: None Restrictions Weight Bearing Restrictions: Yes LLE Weight Bearing: Weight bearing as tolerated      Mobility  Bed Mobility Overal bed mobility: Needs Assistance Bed Mobility: Supine to Sit;Sit to Supine     Supine to sit: Min assist;HOB elevated Sit to supine: Min assist;HOB elevated   General bed mobility comments: Min assist to help progress left leg to EOB.  Pt requesting we go out of the right side of the bed (less painful) despite the fact that she normally goes out of the left side of the bed at home.  HOB elevated to help transition of trunk and min assist needed to progress left leg into and out of bed.    Transfers Overall transfer level: Needs assistance Equipment used: Rolling walker (2 wheeled) Transfers: Sit to/from [facility] Sit to Stand: Min assist;From elevated surface Stand pivot transfers: Min assist;From elevated surface        General transfer comment: Min assist to support trunk and stabilize RW during transitions.  Pt stood, got to recliner chair, felt as though she was going to pass out, recovered there for a few minutes while PT took serial vitals (BPs soft), felt better, wanted to go back to bed, but stopped at the [facility] first.  Sood x 3 and pivoted x2 (last time took some steps backwards to the bed).   Ambulation/Gait Ambulation/Gait assistance: Min assist Ambulation Distance (Feet): 5 Feet Assistive device: Rolling walker (2 wheeled) Gait Pattern/deviations: Step-to pattern;Antalgic     General Gait Details: Moderately antalgic gait pattern, pt keeping only toe down on floor on left leg.  heavily reliant on arms.        Balance Overall balance assessment: Needs assistance Sitting-balance support: Feet supported;Bilateral upper extremity supported Sitting balance-Leahy Scale: Good     Standing balance support: Bilateral upper extremity supported Standing balance-Leahy Scale: Poor                               Pertinent Vitals/Pain Pain Assessment: 0-10 Pain Score: 9  Pain Location: left hip Pain Descriptors / Indicators: Aching;Burning Pain Intervention(s): Limited activity within patient's tolerance;Monitored during session;Repositioned;Premedicated before session    Home Living Family/patient expects to be discharged to:: Private residence Living Arrangements: Alone Available Help at Discharge: Family;Available 24 hours/day Type of Home: House Home Access: Stairs to enter  Entrance Stairs-Rails: Right;Left;Can reach both Entrance Stairs-Number of Steps: 3 Home Layout: One level Home Equipment: Grab bars - tub/shower;Hand held shower head;Bedside commode;Walker - 2 wheels;Walker - 4 wheels;Cane - single point      Prior Function Level of Independence: Independent with assistive device(s)         Comments: using RW for gait     Hand Dominance   Dominant Hand: Right     Extremity/Trunk Assessment   Upper Extremity Assessment Upper Extremity Assessment: Overall WFL for tasks assessed    Lower Extremity Assessment Lower Extremity Assessment: LLE deficits/detail LLE Deficits / Details: left leg with normal post op pain and weakness, ankle at least 3/5, knee at least 3-/5, hip 1/5 per gross functional assessment.  LLE Sensation:  (intact to LT)    Cervical / Trunk Assessment Cervical / Trunk Assessment: Other exceptions Cervical / Trunk Exceptions: h/o lumbar fusion  Communication   Communication: No difficulties  Cognition Arousal/Alertness: Lethargic;Suspect due to medications Behavior During Therapy: [facility] for tasks assessed/performed Overall Cognitive Status: Within Functional Limits for tasks assessed                                           Exercises Total Joint Exercises Ankle Circles/Pumps: AROM;Both;10 reps Quad Sets: AROM;Left;5 reps Heel Slides: AAROM;Left;5 reps Hip ABduction/ADduction: AAROM;Left;5 reps   Assessment/Plan    PT Assessment Patient needs continued PT services  PT Problem List Decreased strength;Decreased range of motion;Decreased activity tolerance;Decreased balance;Decreased mobility;Decreased knowledge of use of DME;Pain;Cardiopulmonary status limiting activity       PT Treatment Interventions DME instruction;Gait training;Stair training;Functional mobility training;Therapeutic exercise;Therapeutic activities;Balance training;Patient/family education;Manual techniques;Modalities    PT Goals (Current goals can be found in the Care Plan section)  Acute Rehab PT Goals Patient Stated Goal: to go home at discharge and have this hip do as well as the last one. PT Goal Formulation: With patient Time For Goal Achievement: 07/26/16 Potential to Achieve Goals: Good    Frequency 7X/week           End of Session Equipment Utilized During Treatment: Gait belt Activity Tolerance: Patient limited by  pain;Treatment limited secondary to medical complications (Comment);Other (comment) (limited by lightheadedness, low BPs) Patient left: in bed;with call bell/phone within reach;with family/visitor present Nurse Communication: Mobility status PT Visit Diagnosis: Muscle weakness (generalized) (M62.81);Difficulty in walking, not elsewhere classified (R26.2);Pain Pain - Right/Left: Left Pain - part of body: Hip    Time: 1287-8676 PT Time Calculation (min) (ACUTE ONLY): 53 min   Charges:   PT Evaluation $PT Eval Moderate Complexity: 1 Procedure PT Treatments $Therapeutic Exercise: 8-22 mins $Therapeutic Activity: 23-37 mins          Adonis Ryther B. Jatasia Gundrum, PT, DPT [phone]   07/19/2016, 6:00 PM

## 2016-07-20 ENCOUNTER — Encounter (HOSPITAL_COMMUNITY): Payer: Self-pay | Admitting: Orthopedic Surgery

## 2016-07-20 LAB — BASIC METABOLIC PANEL
ANION GAP: 10 (ref 5–15)
BUN: 6 mg/dL (ref 6–20)
CALCIUM: 8.5 mg/dL — AB (ref 8.9–10.3)
CO2: 26 mmol/L (ref 22–32)
CREATININE: 0.89 mg/dL (ref 0.44–1.00)
Chloride: 99 mmol/L — ABNORMAL LOW (ref 101–111)
Glucose, Bld: 119 mg/dL — ABNORMAL HIGH (ref 65–99)
Potassium: 3.2 mmol/L — ABNORMAL LOW (ref 3.5–5.1)
Sodium: 135 mmol/L (ref 135–145)

## 2016-07-20 LAB — CBC
HEMATOCRIT: 32.7 % — AB (ref 36.0–46.0)
Hemoglobin: 10.5 g/dL — ABNORMAL LOW (ref 12.0–15.0)
MCH: 27 pg (ref 26.0–34.0)
MCHC: 32.1 g/dL (ref 30.0–36.0)
MCV: 84.1 fL (ref 78.0–100.0)
PLATELETS: 267 10*3/uL (ref 150–400)
RBC: 3.89 MIL/uL (ref 3.87–5.11)
RDW: 15.6 % — AB (ref 11.5–15.5)
WBC: 9.9 10*3/uL (ref 4.0–10.5)

## 2016-07-20 NOTE — Anesthesia Postprocedure Evaluation (Signed)
Anesthesia Post Note  Patient: [place]  Procedure(s) Performed: Procedure(s) (LRB): TOTAL HIP ARTHROPLASTY ANTERIOR APPROACH (Left)  Patient location during evaluation: PACU Anesthesia Type: Spinal Level of consciousness: awake and alert Pain management: pain level controlled Vital Signs Assessment: post-procedure vital signs reviewed and stable Respiratory status: spontaneous breathing, nonlabored ventilation, respiratory function stable and patient connected to nasal cannula oxygen Cardiovascular status: blood pressure returned to baseline and stable Postop Assessment: no signs of nausea or vomiting Anesthetic complications: no       Last Vitals:  Vitals:   07/20/16 0925 07/20/16 1417  BP: 111/69 (!) 101/58  Pulse: 90 83  Resp:  16  Temp: 37.1 C 36.9 C    Last Pain:  Vitals:   07/20/16 1002  TempSrc:   PainSc: [place]

## 2016-07-20 NOTE — Care Management Note (Addendum)
Case Management Note  Patient Details  Name: Brandi Chase MRN: [id number] Date of Birth: [birth year]  Subjective/Objective:   51 yr old female, s/p left total hip arthroplasty.                  Action/Plan: Case manager spoke with patient concerning discharge planning and DME needs. Referral for Home Health therapy was called to [name], [place] [facility] [facility]. Patient states she has 3in1 but will need RW. CM has ordered.     Expected Discharge Date:   07/1116               Expected Discharge Plan:  [place]  In-House Referral:  NA  Discharge planning Services  CM Consult  Post Acute Care Choice:  Durable Medical Equipment, Home Health Choice offered to:  Patient  DME Arranged:  Walker rolling (has 3in1) DME Agency:  [place]:  PT [place] Agency:  [place]  Status of Service:  Completed, signed off  If discussed at [place] of Stay Meetings, dates discussed:    Additional Comments:  [name], RN 07/20/2016, 12:23 PM

## 2016-07-20 NOTE — Progress Notes (Signed)
PATIENT ID: Brandi Chase  MRN: [id number]  DOB/AGE:  51-Jan-1967 / 51 y.o.  1 Day Post-Op Procedure(s) (LRB): TOTAL HIP ARTHROPLASTY ANTERIOR APPROACH (Left)    PROGRESS NOTE Subjective: Patient is alert, oriented, no Nausea, no Vomiting, yes passing gas, . Taking PO well. Denies SOB, Chest or Calf Pain. Using Incentive Spirometer, PAS in place. Ambulate WBAT to BRlast night Patient reports pain as  3/10  .    Objective: Vital signs in last 24 hours: Vitals:   07/19/16 1724 07/19/16 1725 07/19/16 2301 07/20/16 0150  BP: (!) 105/56 (!) 106/59 (!) 109/59 107/64  Pulse:  77 88 94  Resp:   15   Temp:   98.7 F (37.1 C) 99.9 F (37.7 C)  TempSrc:   Oral Axillary  SpO2: 100% 100% 100% 98%  Weight:          Intake/Output from previous day: I/O last 3 completed shifts: In: 1560 [P.O.:360; I.V.:1200] Out: 1100 [Urine:600; Blood:500]   Intake/Output this shift: No intake/output data recorded.   LABORATORY DATA: No results for input(s): WBC, HGB, HCT, PLT, NA, K, CL, CO2, BUN, CREATININE, GLUCOSE, GLUCAP, INR, CALCIUM in the last 72 hours.  Invalid input(s): PT, 2  Examination: Neurologically intact ABD soft Neurovascular intact Sensation intact distally Intact pulses distally Dorsiflexion/Plantar flexion intact Incision: dressing C/D/I No cellulitis present Compartment soft} XR AP&Lat of hip shows well placed\fixed THA  Assessment:   1 Day Post-Op Procedure(s) (LRB): TOTAL HIP ARTHROPLASTY ANTERIOR APPROACH (Left) ADDITIONAL DIAGNOSIS:  Expected Acute Blood Loss Anemia, Hypertension  Plan: PT/OT WBAT, THA  DVT Prophylaxis: SCDx72 hrs, ASA 325 mg BID x 2 weeks  DISCHARGE PLAN: Home, prob tomorrow  DISCHARGE NEEDS: HHPT, Walker and 3-in-1 comode seatPatient ID: Brandi Chase, female   DOB: 1965-04-27, 51 y.o.   MRN: [id number]

## 2016-07-20 NOTE — Progress Notes (Signed)
qPhysical Therapy Treatment Patient Details Name: Brandi Chase MRN: [id number] DOB: 07-Dec-1965 Today's Date: 07/20/2016    History of Present Illness 51 y.o. female admitted to [facility] on 07/19/16 for elective L direct ant THA.  Pt with significant PMHx of HTN, dyspnea, chronic low back pain, L carpal tunnel syndrome, anemia, R THA (11/2015), and TLIF of L4-S1.      PT Comments    Patient was able to progress with gait and safely negotiate stairs this session. Current plan remains appropriate.    Follow Up Recommendations  Home health PT;Supervision for mobility/OOB     Equipment Recommendations  None recommended by PT    Recommendations for Other Services       Precautions / Restrictions Precautions Precautions: None Restrictions Weight Bearing Restrictions: Yes LLE Weight Bearing: Weight bearing as tolerated    Mobility  Bed Mobility Overal bed mobility: Needs Assistance Bed Mobility: Sit to Supine       Sit to supine: Min assist   General bed mobility comments: assist to bring L LE into bed; cues for sequencing; height of bed elevated to simulate home  Transfers Overall transfer level: Needs assistance Equipment used: Rolling walker (2 wheeled) Transfers: Sit to/from Stand Sit to Stand: Min guard;Supervision         General transfer comment: min guard from recliner and supervision from [facility]; safe hand placement  Ambulation/Gait Ambulation/Gait assistance: Supervision Ambulation Distance (Feet): 150 Feet Assistive device: Rolling walker (2 wheeled) Gait Pattern/deviations: Step-through pattern;Decreased stance time - left;Decreased step length - right;Decreased weight shift to left Gait velocity: decreased   General Gait Details: pt with improved L hip flexion, posture, and step length symmetry   Stairs Stairs: Yes   Stair Management: One rail Left;Step to pattern;Sideways Number of Stairs: 3 General stair comments: cues for sequencing and  technique  Wheelchair Mobility    Modified Rankin (Stroke Patients Only)       Balance Overall balance assessment: Needs assistance Sitting-balance support: Feet supported;Bilateral upper extremity supported Sitting balance-Leahy Scale: Good     Standing balance support: Bilateral upper extremity supported Standing balance-Leahy Scale: Poor Standing balance comment: reliant on RW                            Cognition Arousal/Alertness: Awake/alert Behavior During Therapy: WFL for tasks assessed/performed Overall Cognitive Status: Within Functional Limits for tasks assessed                                        Exercises      General Comments        Pertinent Vitals/Pain Pain Assessment: 0-10 Pain Score: 7  Pain Location: left hip with mobility Pain Descriptors / Indicators: Aching;Sore;Tightness Pain Intervention(s): Limited activity within patient's tolerance;Monitored during session;Premedicated before session;Repositioned    Home Living                      Prior Function            PT Goals (current goals can now be found in the care plan section) Acute Rehab PT Goals Patient Stated Goal: to go home at discharge and have this hip do as well as the last one. PT Goal Formulation: With patient Time For Goal Achievement: 07/26/16 Potential to Achieve Goals: Good Progress towards PT goals: Progressing toward goals  Frequency    7X/week      PT Plan Current plan remains appropriate    Co-evaluation             End of Session Equipment Utilized During Treatment: Gait belt Activity Tolerance: Patient tolerated treatment well Patient left: with call bell/phone within reach;in bed;with family/visitor present Nurse Communication: Mobility status PT Visit Diagnosis: Muscle weakness (generalized) (M62.81);Difficulty in walking, not elsewhere classified (R26.2);Pain Pain - Right/Left: Left Pain - part of body:  Hip     Time: 0940-7680 PT Time Calculation (min) (ACUTE ONLY): 34 min  Charges:  $Gait Training: 8-22 mins $Therapeutic Activity: 8-22 mins                    G Codes:       Earney Navy, PTA Pager: [phone]     Darliss Cheney 07/20/2016, 3:00 PM

## 2016-07-20 NOTE — Progress Notes (Addendum)
qPhysical Therapy Treatment Patient Details Name: Brandi Chase MRN: [id number] DOB: 20-Oct-1965 Today's Date: 07/20/2016    History of Present Illness 51 y.o. female admitted to [facility] on 07/19/16 for elective L direct ant THA.  Pt with significant PMHx of HTN, dyspnea, chronic low back pain, L carpal tunnel syndrome, anemia, R THA (11/2015), and TLIF of L4-S1.      PT Comments    Patient is progressing toward mobility goals. VSS this session. Continue to progress as tolerated with anticipated d/c home with HHPT.    Follow Up Recommendations  Home health PT;Supervision for mobility/OOB     Equipment Recommendations  None recommended by PT    Recommendations for Other Services       Precautions / Restrictions Precautions Precautions: None Restrictions Weight Bearing Restrictions: Yes LLE Weight Bearing: Weight bearing as tolerated    Mobility  Bed Mobility Overal bed mobility: Needs Assistance Bed Mobility: Supine to Sit     Supine to sit: Min assist     General bed mobility comments: assist to bring L LE to EOB; cues for sequencing and technique; HOB flat and no use of rails  Transfers Overall transfer level: Needs assistance Equipment used: Rolling walker (2 wheeled) Transfers: Sit to/from Stand Sit to Stand: Min guard         General transfer comment: min guard for safety from EOB and BSC; cues for safe hand placement first trial with carry over demonstrated  Ambulation/Gait Ambulation/Gait assistance: Min guard Ambulation Distance (Feet): 75 Feet Assistive device: Rolling walker (2 wheeled) Gait Pattern/deviations: Step-through pattern;Decreased stance time - left;Decreased step length - right;Decreased weight shift to left     General Gait Details: slow, steady gait; cues for posture; pt with improved L heel strike and weightbearing this session   Stairs            Wheelchair Mobility    Modified Rankin (Stroke Patients Only)        Balance Overall balance assessment: Needs assistance Sitting-balance support: Feet supported;Bilateral upper extremity supported Sitting balance-Leahy Scale: Good     Standing balance support: Bilateral upper extremity supported Standing balance-Leahy Scale: Poor Standing balance comment: reliant on RW                            Cognition Arousal/Alertness: Lethargic;Suspect due to medications Behavior During Therapy: [facility] for tasks assessed/performed Overall Cognitive Status: Within Functional Limits for tasks assessed                                        Exercises      General Comments General comments (skin integrity, edema, etc.): c/o slight dizziness with mobility; BP 107/63 and SpO2 97% on RA end of session       Pertinent Vitals/Pain Pain Assessment: 0-10 Pain Score: 5  Pain Location: left hip Pain Descriptors / Indicators: Aching;Sore Pain Intervention(s): Limited activity within patient's tolerance;Monitored during session;Premedicated before session;Repositioned    Home Living                      Prior Function            PT Goals (current goals can now be found in the care plan section) Acute Rehab PT Goals Patient Stated Goal: to go home at discharge and have this hip do as well as the  last one. PT Goal Formulation: With patient Time For Goal Achievement: 07/26/16 Potential to Achieve Goals: Good    Frequency    7X/week      PT Plan Current plan remains appropriate    Co-evaluation             End of Session Equipment Utilized During Treatment: Gait belt Activity Tolerance: Patient tolerated treatment well Patient left: with call bell/phone within reach;in chair Nurse Communication: Mobility status PT Visit Diagnosis: Muscle weakness (generalized) (M62.81);Difficulty in walking, not elsewhere classified (R26.2);Pain Pain - Right/Left: Left Pain - part of body: Hip     Time: 0941 - 1013 32  mins    Charges:    Gait training 8-22 mins Therapeutic Activity 8-22 mins                   G Codes:       Brandi Chase, PTA Pager: [phone]     Brandi Chase 07/20/2016, 10:20 AM

## 2016-07-21 LAB — CBC
HEMATOCRIT: 32.1 % — AB (ref 36.0–46.0)
Hemoglobin: 10.5 g/dL — ABNORMAL LOW (ref 12.0–15.0)
MCH: 27.3 pg (ref 26.0–34.0)
MCHC: 32.7 g/dL (ref 30.0–36.0)
MCV: 83.6 fL (ref 78.0–100.0)
Platelets: 285 10*3/uL (ref 150–400)
RBC: 3.84 MIL/uL — AB (ref 3.87–5.11)
RDW: 15.8 % — AB (ref 11.5–15.5)
WBC: 14.8 10*3/uL — AB (ref 4.0–10.5)

## 2016-07-21 NOTE — Progress Notes (Signed)
PATIENT ID: Brandi Chase  MRN: [id number]  DOB/AGE:  [birth year] / 51 y.o.  2 Days Post-Op Procedure(s) (LRB): TOTAL HIP ARTHROPLASTY ANTERIOR APPROACH (Left)    PROGRESS NOTE Subjective: Patient is alert, oriented, no Nausea, no Vomiting, yes passing gas, . Taking PO well. Denies SOB, Chest or Calf Pain. Using Incentive Spirometer, PAS in place. Ambulate WBAT with pt walking 150 ft with therapy and practicing stairs  Patient reports pain as  3/10  .    Objective: Vital signs in last 24 hours: Vitals:   07/20/16 0925 07/20/16 1417 07/20/16 2005 07/21/16 0300  BP: 111/69 (!) 101/58 121/64 117/68  Pulse: 90 83 91 80  Resp:  16 16 16   Temp: 98.8 F (37.1 C) 98.5 F (36.9 C) 98.9 F (37.2 C) 98.4 F (36.9 C)  TempSrc: Oral  Oral Oral  SpO2: 100% 97% 97% 97%  Weight:          Intake/Output from previous day: I/O last 3 completed shifts: In: 1620 [P.O.:1620] Out: -    Intake/Output this shift: No intake/output data recorded.   LABORATORY DATA:  Recent Labs  07/20/16 0556 07/21/16 0518  WBC 9.9 14.8*  HGB 10.5* 10.5*  HCT 32.7* 32.1*  PLT 267 285  NA 135  --   K 3.2*  --   CL 99*  --   CO2 26  --   BUN 6  --   CREATININE 0.89  --   GLUCOSE 119*  --   CALCIUM 8.5*  --     Examination: Neurologically intact Neurovascular intact Sensation intact distally Intact pulses distally Dorsiflexion/Plantar flexion intact Incision: dressing C/D/I No cellulitis present Compartment soft} XR AP&Lat of hip shows well placed\fixed THA  Assessment:   2 Days Post-Op Procedure(s) (LRB): TOTAL HIP ARTHROPLASTY ANTERIOR APPROACH (Left) ADDITIONAL DIAGNOSIS:  Expected Acute Blood Loss Anemia, Hypertension  Plan: PT/OT WBAT, THA  DVT Prophylaxis: SCDx72 hrs, ASA 325 mg BID x 2 weeks  DISCHARGE PLAN: Home  DISCHARGE NEEDS: HHPT, Walker and 3-in-1 comode seat

## 2016-07-21 NOTE — Progress Notes (Signed)
Physical Therapy Treatment Patient Details Name: Brandi Chase MRN: [id number] DOB: 02/04/1966 Today's Date: 07/21/2016    History of Present Illness 51 y.o. female admitted to [facility] on 07/19/16 for elective L direct ant THA.  Pt with significant PMHx of HTN, dyspnea, chronic low back pain, L carpal tunnel syndrome, anemia, R THA (11/2015), and TLIF of L4-S1.      PT Comments    Patient is making good progress with PT.  From a mobility standpoint anticipate patient will be ready for DC home when medically ready.      Follow Up Recommendations  Home health PT;Supervision for mobility/OOB     Equipment Recommendations  None recommended by PT    Recommendations for Other Services       Precautions / Restrictions Precautions Precautions: None Restrictions Weight Bearing Restrictions: Yes LLE Weight Bearing: Weight bearing as tolerated    Mobility  Bed Mobility Overal bed mobility: Needs Assistance Bed Mobility: Supine to Sit     Supine to sit: Supervision     General bed mobility comments: supervision for safety  Transfers Overall transfer level: Needs assistance Equipment used: Rolling walker (2 wheeled) Transfers: Sit to/from Stand Sit to Stand: Supervision         General transfer comment: safe hand placement   Ambulation/Gait Ambulation/Gait assistance: Supervision Ambulation Distance (Feet): 180 Feet Assistive device: Rolling walker (2 wheeled) Gait Pattern/deviations: Step-through pattern;Decreased stride length Gait velocity: decreased   General Gait Details: pt with steady gait; cues for posture initially   Stairs            Wheelchair Mobility    Modified Rankin (Stroke Patients Only)       Balance Overall balance assessment: Needs assistance Sitting-balance support: Feet supported;Bilateral upper extremity supported Sitting balance-Leahy Scale: Good       Standing balance-Leahy Scale: Fair                               Cognition Arousal/Alertness: Awake/alert Behavior During Therapy: WFL for tasks assessed/performed Overall Cognitive Status: Within Functional Limits for tasks assessed                                        Exercises Total Joint Exercises Hip ABduction/ADduction: Left;10 reps;AROM;Standing Knee Flexion: AROM;Left;10 reps;Standing Marching in Standing: AROM;Left;10 reps;Standing Standing Hip Extension: AROM;Left;10 reps;Standing    General Comments        Pertinent Vitals/Pain Pain Assessment: Faces Faces Pain Scale: Hurts little more Pain Location: left hip  Pain Descriptors / Indicators: Sore;Tightness;Guarding Pain Intervention(s): Limited activity within patient's tolerance;Monitored during session;Repositioned;Patient requesting pain meds-RN notified;Premedicated before session    Home Living                      Prior Function            PT Goals (current goals can now be found in the care plan section) Acute Rehab PT Goals Patient Stated Goal: to go home at discharge and have this hip do as well as the last one. PT Goal Formulation: With patient Time For Goal Achievement: 07/26/16 Potential to Achieve Goals: Good Progress towards PT goals: Progressing toward goals    Frequency    7X/week      PT Plan Current plan remains appropriate    Co-evaluation  End of Session Equipment Utilized During Treatment: Gait belt Activity Tolerance: Patient tolerated treatment well Patient left: with call bell/phone within reach;in chair Nurse Communication: Mobility status PT Visit Diagnosis: Muscle weakness (generalized) (M62.81);Difficulty in walking, not elsewhere classified (R26.2);Pain Pain - Right/Left: Left Pain - part of body: Hip     Time: 6770-3403 PT Time Calculation (min) (ACUTE ONLY): 21 min  Charges:  $Gait Training: 8-22 mins                    G Codes:       Earney Navy, PTA Pager: [phone]     Darliss Cheney 07/21/2016, 5:32 PM

## 2016-07-21 NOTE — Progress Notes (Signed)
qPhysical Therapy Treatment Patient Details Name: Brandi Chase MRN: [id number] DOB: 25-Mar-1966 Today's Date: 07/21/2016    History of Present Illness 51 y.o. female admitted to [facility] on 07/19/16 for elective L direct ant THA.  Pt with significant PMHx of HTN, dyspnea, chronic low back pain, L carpal tunnel syndrome, anemia, R THA (11/2015), and TLIF of L4-S1.      PT Comments    Patient continues to make progress toward mobility goals. Complete HEP next session. Continue to progress as tolerated with anticipated d/c home with HHPT.    Follow Up Recommendations  Home health PT;Supervision for mobility/OOB     Equipment Recommendations  None recommended by PT    Recommendations for Other Services       Precautions / Restrictions Precautions Precautions: None Restrictions Weight Bearing Restrictions: Yes LLE Weight Bearing: Weight bearing as tolerated    Mobility  Bed Mobility Overal bed mobility: Needs Assistance Bed Mobility: Sit to Supine;Supine to Sit     Supine to sit: Supervision Sit to supine: Min assist   General bed mobility comments: assist to bring L LE into bed; cues for sequencing  Transfers Overall transfer level: Needs assistance Equipment used: Rolling walker (2 wheeled) Transfers: Sit to/from Stand Sit to Stand: Supervision         General transfer comment: supervision for safety; demo of safe hand placement  Ambulation/Gait Ambulation/Gait assistance: Supervision Ambulation Distance (Feet): 120 Feet Assistive device: Rolling walker (2 wheeled) Gait Pattern/deviations: Step-through pattern;Decreased stance time - left;Decreased step length - right;Decreased weight shift to left Gait velocity: decreased   General Gait Details: pt with improved gait mechanics and slightly increased cadence    Stairs     Stair Management: One rail Left;Step to pattern;Sideways   General stair comments: supervision for safety; carry over of  sequencing  Wheelchair Mobility    Modified Rankin (Stroke Patients Only)       Balance Overall balance assessment: Needs assistance Sitting-balance support: Feet supported;Bilateral upper extremity supported Sitting balance-Leahy Scale: Good       Standing balance-Leahy Scale: Fair                              Cognition Arousal/Alertness: Awake/alert Behavior During Therapy: WFL for tasks assessed/performed Overall Cognitive Status: Within Functional Limits for tasks assessed                                        Exercises Total Joint Exercises Quad Sets: AROM;Both;10 reps Short Arc Quad: AROM;Left;10 reps Heel Slides: AROM;Left;10 reps Hip ABduction/ADduction: AAROM;Left;10 reps Long Arc Quad: AROM;Left;10 reps;Seated    General Comments        Pertinent Vitals/Pain Pain Assessment: Faces Faces Pain Scale: Hurts little more Pain Location: left hip  Pain Descriptors / Indicators: Sore;Tightness;Guarding Pain Intervention(s): Limited activity within patient's tolerance;Monitored during session;Premedicated before session;Repositioned    Home Living                      Prior Function            PT Goals (current goals can now be found in the care plan section) Acute Rehab PT Goals Patient Stated Goal: to go home at discharge and have this hip do as well as the last one. PT Goal Formulation: With patient Time For Goal Achievement: 07/26/16 Potential  to Achieve Goals: Good Progress towards PT goals: Progressing toward goals    Frequency    7X/week      PT Plan Current plan remains appropriate    Co-evaluation             End of Session Equipment Utilized During Treatment: Gait belt Activity Tolerance: Patient tolerated treatment well Patient left: with call bell/phone within reach;in bed Nurse Communication: Mobility status PT Visit Diagnosis: Muscle weakness (generalized) (M62.81);Difficulty in  walking, not elsewhere classified (R26.2);Pain Pain - Right/Left: Left Pain - part of body: Hip     Time: 0901-0929 PT Time Calculation (min) (ACUTE ONLY): 28 min  Charges:  $Gait Training: 8-22 mins $Therapeutic Exercise: 8-22 mins                    G Codes:       Earney Navy, PTA Pager: [phone]     Darliss Cheney 07/21/2016, 9:36 AM

## 2016-07-21 NOTE — Discharge Summary (Signed)
Patient ID: Brandi Chase MRN: [id number] DOB/AGE: Jul 21, 1965 51 y.o.  Admit date: 07/19/2016 Discharge date: 07/21/2016  Admission Diagnoses:  Active Problems:   Primary osteoarthritis of left hip   Status post total hip replacement, left   Discharge Diagnoses:  Same  Past Medical History:  Diagnosis Date  . Adnexal cyst 2011   removed  . Anemia   . Arthritis    "neck; spine" (08/29/2014)  . Asthma   . Carpal tunnel syndrome of left wrist   . Chronic lower back pain   . Complication of anesthesia    difficult to awake  . Daily headache 12/2013   "tx'd and no problems anymore" (08/29/2014)  . Dyspnea    with asthma attack- none recent  . Family history of adverse reaction to anesthesia    "1 sister and both parents hard to wake up"  . Heart murmur    "dx'd when I was a teenager; haven't had any problems w/it"  . Hip pain, right   . History of bronchitis   . Hypertension   . Urinary incontinence   . Urinary urgency   . UTI (lower urinary tract infection)     Surgeries: Procedure(s): TOTAL HIP ARTHROPLASTY ANTERIOR APPROACH on 07/19/2016   Consultants:   Discharged Condition: Improved  Hospital Course: Brandi Chase is an 51 y.o. female who was admitted 07/19/2016 for operative treatment of<principal problem not specified>. Patient has severe unremitting pain that affects sleep, daily activities, and work/hobbies. After pre-op clearance the patient was taken to the operating room on 07/19/2016 and underwent  Procedure(s): TOTAL HIP ARTHROPLASTY ANTERIOR APPROACH.    Patient was given perioperative antibiotics: Anti-infectives    Start     Dose/Rate Route Frequency Ordered Stop   07/19/16 0700  ceFAZolin (ANCEF) IVPB 2g/100 mL premix     2 g 200 mL/hr over 30 Minutes Intravenous To ShortStay Surgical 07/16/16 0949 07/19/16 0750       Patient was given sequential compression devices, early ambulation, and chemoprophylaxis to prevent DVT.  Patient benefited  maximally from hospital stay and there were no complications.    Recent vital signs: Patient Vitals for the past 24 hrs:  BP Temp Temp src Pulse Resp SpO2  07/21/16 0300 117/68 98.4 F (36.9 C) Oral 80 16 97 %  07/20/16 2005 121/64 98.9 F (37.2 C) Oral 91 16 97 %  07/20/16 1417 (!) 101/58 98.5 F (36.9 C) - 83 16 97 %  07/20/16 0925 111/69 98.8 F (37.1 C) Oral 90 - 100 %     Recent laboratory studies:  Recent Labs  07/20/16 0556 07/21/16 0518  WBC 9.9 14.8*  HGB 10.5* 10.5*  HCT 32.7* 32.1*  PLT 267 285  NA 135  --   K 3.2*  --   CL 99*  --   CO2 26  --   BUN 6  --   CREATININE 0.89  --   GLUCOSE 119*  --   CALCIUM 8.5*  --      Discharge Medications:   Allergies as of 07/21/2016      Reactions   Other Anaphylaxis   ** PECANS **   Primatene Mist [epinephrine] Other (See Comments)   SYNCOPE > "BLACKED OUT"   Shrimp [shellfish Allergy] Anaphylaxis, Other (See Comments)   CAUSED VISION LOSS   Aleve [naproxen Sodium] Other (See Comments)   CHEST PAIN   Bactrim Ds [sulfamethoxazole W/trimethoprim (co-trimoxazole)] Other (See Comments)   VISUAL HALLUCINATIONS   Midol [ibuprofen] Anxiety  AGITATION.   Avocado Rash   Cocoa Butter Itching   Latex Itching      Medication List    STOP taking these medications   methocarbamol 500 MG tablet Commonly known as:  ROBAXIN     TAKE these medications   albuterol 108 (90 Base) MCG/ACT inhaler Commonly known as:  PROVENTIL HFA;VENTOLIN HFA Inhale 2 puffs into the lungs every 6 (six) hours as needed for wheezing or shortness of breath.   aspirin EC 325 MG tablet Take 1 tablet (325 mg total) by mouth 2 (two) times daily.   hydrochlorothiazide 12.5 MG capsule Commonly known as:  MICROZIDE Take 1 capsule (12.5 mg total) by mouth daily.   ibuprofen 800 MG tablet Commonly known as:  ADVIL,MOTRIN Take 1 tablet (800 mg total) by mouth every 8 (eight) hours as needed. What changed:  reasons to take this   megestrol  20 MG tablet Commonly known as:  MEGACE Take 2 tablets (40 mg total) by mouth daily. Start with 2 tabs every 8 hrs x 2 days, then 2 tabs every 12 hrs, then two tabs daily   oxyCODONE-acetaminophen 5-325 MG tablet Commonly known as:  ROXICET Take 1-2 tablets by mouth every 4 (four) hours as needed. What changed:  how much to take  when to take this  reasons to take this  additional instructions   tiZANidine 2 MG tablet Commonly known as:  ZANAFLEX Take 1 tablet (2 mg total) by mouth every 6 (six) hours as needed for muscle spasms.            Durable Medical Equipment        Start     Ordered   07/19/16 1517  DME Walker rolling  Once    Question:  Patient needs a walker to treat with the following condition  Answer:  Status post total hip replacement, left   07/19/16 1517   07/19/16 1517  DME 3 n 1  Once     07/19/16 1517   07/19/16 1517  DME Bedside commode  Once    Question:  Patient needs a bedside commode to treat with the following condition  Answer:  Status post total hip replacement, left   07/19/16 1517      Diagnostic Studies: Dg C-arm 1-60 Min  Result Date: 07/19/2016 CLINICAL DATA:  Left hip arthroplasty. EXAM: OPERATIVE left HIP (WITH PELVIS IF PERFORMED) 2 VIEWS TECHNIQUE: Fluoroscopic spot image(s) were submitted for interpretation post-operatively. FLUOROSCOPY TIME:  0 minutes 15 seconds. COMPARISON:  05/28/2016. FINDINGS: 2 intraoperative fluoroscopic spot views of the left hip show interval left total hip arthroplasty. No complicating features. Right total hip arthroplasty is incidentally imaged. IMPRESSION: Left total hip arthroplasty without complicating feature. Electronically Signed   By: Lorin Picket M.D.   On: 07/19/2016 09:18   Dg Hip Operative Unilat W Or W/o Pelvis Left  Result Date: 07/19/2016 CLINICAL DATA:  Left hip arthroplasty. EXAM: OPERATIVE left HIP (WITH PELVIS IF PERFORMED) 2 VIEWS TECHNIQUE: Fluoroscopic spot image(s) were submitted  for interpretation post-operatively. FLUOROSCOPY TIME:  0 minutes 15 seconds. COMPARISON:  05/28/2016. FINDINGS: 2 intraoperative fluoroscopic spot views of the left hip show interval left total hip arthroplasty. No complicating features. Right total hip arthroplasty is incidentally imaged. IMPRESSION: Left total hip arthroplasty without complicating feature. Electronically Signed   By: Lorin Picket M.D.   On: 07/19/2016 09:18    Disposition: 01-Home or Self Care  Discharge Instructions    Call MD / Call 911  Complete by:  As directed    If you experience chest pain or shortness of breath, CALL 911 and be transported to the hospital emergency room.  If you develope a fever above 101 F, pus (white drainage) or increased drainage or redness at the wound, or calf pain, call your surgeon's office.   Constipation Prevention    Complete by:  As directed    Drink plenty of fluids.  Prune juice may be helpful.  You may use a stool softener, such as Colace (over the counter) 100 mg twice a day.  Use MiraLax (over the counter) for constipation as needed.   Diet - low sodium heart healthy    Complete by:  As directed    Driving restrictions    Complete by:  As directed    No driving for 2 weeks   Follow the hip precautions as taught in Physical Therapy    Complete by:  As directed    Increase activity slowly as tolerated    Complete by:  As directed    Patient may shower    Complete by:  As directed    You may shower without a dressing once there is no drainage.  Do not wash over the wound.  If drainage remains, cover wound with plastic wrap and then shower.      Follow-up Information    Kerin Salen, MD Follow up in 2 week(s).   Specialty:  Orthopedic Surgery Contact information: [place] [address] [place] Follow up.   Why:  A representative from [place] will contact you to arrange start date and time for your  therapy. Contact information: [address] [place] [address] [phone]            Signed: Theodosia Quay 07/21/2016, 7:55 AM

## 2016-07-21 NOTE — Progress Notes (Signed)
Stopped by to visit with pt, who was not in rm.   07/21/16 1600  Clinical Encounter Type  Visited With Patient not available  Visit Type Initial  Referral From [place] Izac Faulkenberry, Chaplain

## 2016-08-06 ENCOUNTER — Telehealth: Payer: Self-pay

## 2016-08-06 NOTE — Telephone Encounter (Signed)
oxyCODONE-acetaminophen (ROXICET) 5-325 MG tablet, requesting PA.

## 2016-08-12 ENCOUNTER — Ambulatory Visit (INDEPENDENT_AMBULATORY_CARE_PROVIDER_SITE_OTHER): Payer: Medicaid Other | Admitting: Internal Medicine

## 2016-08-12 ENCOUNTER — Telehealth: Payer: Self-pay

## 2016-08-12 ENCOUNTER — Encounter: Payer: Self-pay | Admitting: Internal Medicine

## 2016-08-12 VITALS — BP 125/64 | HR 84 | Temp 98.4°F | Ht 62.0 in | Wt 209.5 lb

## 2016-08-12 DIAGNOSIS — Z96642 Presence of left artificial hip joint: Secondary | ICD-10-CM

## 2016-08-12 DIAGNOSIS — M1612 Unilateral primary osteoarthritis, left hip: Secondary | ICD-10-CM | POA: Diagnosis not present

## 2016-08-12 DIAGNOSIS — R32 Unspecified urinary incontinence: Secondary | ICD-10-CM | POA: Diagnosis not present

## 2016-08-12 MED ORDER — OXYCODONE-ACETAMINOPHEN 5-325 MG PO TABS: 1.0000 | ORAL_TABLET | ORAL | 0 refills | Status: DC | PRN

## 2016-08-12 NOTE — Progress Notes (Signed)
   CC: Hip pain   HPI:  [name] is a 51 y.o. woman with history noted below that presents to the [facility] Medicine Clinic for follow up of her left sided hip pain.  Patient had a left, total hip replacement last month and reports that her pain has significantly improved.  She is requiring two oxycodone-acetaminophen a day to complete her actvities of daily living.  She states that prior to her surgery she had to wear adult diapers because she could not make it to the bathroom on time due to her pain.  She states that she continues to have to wear adult diapers, mainly at night because she becomes incontinent.  These symptoms have been worse in the past two weeks.  She reports that in the day she is able to make it to the bathroom without difficulty.  She denies urge or stress incontinence.  She denies dysuria or vaginal itching/discomfort.  She denies having a foley in place for her hip replacement surgery.  Past Medical History:  Diagnosis Date  . Adnexal cyst 2011   removed  . Anemia   . Arthritis    "neck; spine" (08/29/2014)  . Asthma   . Carpal tunnel syndrome of left wrist   . Chronic lower back pain   . Complication of anesthesia    difficult to awake  . Daily headache 12/2013   "tx'd and no problems anymore" (08/29/2014)  . Dyspnea    with asthma attack- none recent  . Family history of adverse reaction to anesthesia    "1 sister and both parents hard to wake up"  . Heart murmur    "dx'd when I was a teenager; haven't had any problems w/it"  . Hip pain, right   . History of bronchitis   . Hypertension   . Urinary incontinence   . Urinary urgency   . UTI (lower urinary tract infection)     Review of Systems:  Review of Systems  Constitutional: Negative for chills and fever.  Genitourinary: Negative for dysuria, flank pain, frequency, hematuria and urgency.  Musculoskeletal: Negative for falls.  All other systems reviewed and are negative.    Physical  Exam:  Vitals:   08/12/16 1550  BP: 125/64  Pulse: 84  Temp: 98.4 F (36.9 C)  TempSrc: Oral  SpO2: 100%  Weight: 209 lb 8 oz (95 kg)  Height: 5\' 2"  (1.575 m)   Physical Exam  Constitutional: She is well-developed, well-nourished, and in no distress.  Cardiovascular: Normal rate, regular rhythm and normal heart sounds.  Exam reveals no gallop and no friction rub.   No murmur heard. Pulmonary/Chest: Effort normal and breath sounds normal. No respiratory distress. She has no wheezes. She has no rales.  Musculoskeletal: She exhibits no edema or tenderness.    Assessment & Plan:   See encounters tab for problem based medical decision making.    Patient discussed with Dr. Beryle Beams

## 2016-08-12 NOTE — Telephone Encounter (Signed)
Pharmacy faxed  PA request oxycodone-acetaminophen 5-325 mg contact [facility]  [phone]

## 2016-08-12 NOTE — Patient Instructions (Signed)
Brandi Chase,  It was a pleasure seeing you today. A referral for Urology has been made Please follow up in the acute care clinic for any acute medical needs. Please follow up with me in 3 months

## 2016-08-13 LAB — URINALYSIS, ROUTINE W REFLEX MICROSCOPIC
BILIRUBIN UA: NEGATIVE
Glucose, UA: NEGATIVE
KETONES UA: NEGATIVE
LEUKOCYTES UA: NEGATIVE
Nitrite, UA: NEGATIVE
PROTEIN UA: NEGATIVE
RBC UA: NEGATIVE
SPEC GRAV UA: 1.022 (ref 1.005–1.030)
Urobilinogen, Ur: 0.2 mg/dL (ref 0.2–1.0)
pH, UA: 5 (ref 5.0–7.5)

## 2016-08-15 DIAGNOSIS — R32 Unspecified urinary incontinence: Secondary | ICD-10-CM | POA: Insufficient documentation

## 2016-08-15 NOTE — Assessment & Plan Note (Signed)
Assessment:  Osteoarthritis of left hip s/p left total hip replacement Patient reports using two oxycodone-acetaminophen to help with her activities of daily living.  Patient was requiring three tablets prior to her surgery.  The increase in narcotics was done to help control patient's pain prior to surgery and help with activities of daily living. This was discussed in previous visit and in new pain contract. I am currenlty tapering her down and on next visit will transition her back to her previous pain medication of tramadol used to help with her knee pain.  Plan 1 more month supply of oxycodone-acetaminophen  Transition to tramadol on next visit

## 2016-08-15 NOTE — Assessment & Plan Note (Signed)
Assessment:  Urinary incontinence  Patient reports urinary incontinence, mostly at night time and more consistent over the past two weeks.  Patient denies urge or stress incontinence.  Patient denies dysuria or increased frequency in urination. She declined a pelvic exam today.  A urine dipstick was obtained which did not show signs of infection  Plan - referral to urology - [facility]

## 2016-08-15 NOTE — Progress Notes (Signed)
Medicine attendi Medical history, presenting problems, physical findings, and medications, reviewed with resident physician Dr Kalman Shan on the day of the patient visit and I concur with her evaluation and management plan.

## 2016-08-18 NOTE — Telephone Encounter (Signed)
Prior Authorization was approved for Oxycodone-Acetaminophen 5-325 08/17/2016 thru 10/16/2016.  Sander Nephew, RN 08/18/2016 8:59 AM

## 2016-09-15 NOTE — Telephone Encounter (Signed)
Copied from previous note..  Prior Authorization was approved for Oxycodone-Acetaminophen 5-325 08/17/2016 thru 10/16/2016.  Sander Nephew, RN 08/18/2016 8:59 AM

## 2016-10-22 NOTE — Anesthesia Postprocedure Evaluation (Signed)
Anesthesia Post Note  Patient: Brandi Chase  Procedure(s) Performed: Procedure(s) (LRB): TOTAL HIP ARTHROPLASTY ANTERIOR APPROACH (Left)     Anesthesia Post Evaluation  Last Vitals:  Vitals:   07/21/16 0300 07/21/16 1400  BP: 117/68 (!) 103/46  Pulse: 80 (!) 101  Resp: 16 18  Temp: 36.9 C 37.1 C    Last Pain:  Vitals:   07/21/16 1400  TempSrc: Oral  PainSc:                  Riccardo Dubin

## 2016-10-22 NOTE — Addendum Note (Signed)
Addendum  created 10/22/16 1110 by Lyndle Herrlich, MD   Sign clinical note

## 2016-10-29 NOTE — Telephone Encounter (Signed)
Telephone note opened in error

## 2016-11-11 ENCOUNTER — Ambulatory Visit: Payer: Medicaid Other

## 2016-11-18 ENCOUNTER — Encounter: Payer: Self-pay | Admitting: Internal Medicine

## 2016-11-18 ENCOUNTER — Ambulatory Visit (INDEPENDENT_AMBULATORY_CARE_PROVIDER_SITE_OTHER): Payer: Self-pay | Admitting: Internal Medicine

## 2016-11-18 ENCOUNTER — Ambulatory Visit: Payer: Self-pay

## 2016-11-18 VITALS — BP 131/69 | HR 74 | Temp 98.4°F | Ht 62.0 in | Wt 221.9 lb

## 2016-11-18 DIAGNOSIS — Z96642 Presence of left artificial hip joint: Secondary | ICD-10-CM

## 2016-11-18 DIAGNOSIS — I10 Essential (primary) hypertension: Secondary | ICD-10-CM

## 2016-11-18 DIAGNOSIS — Z0000 Encounter for general adult medical examination without abnormal findings: Secondary | ICD-10-CM

## 2016-11-18 DIAGNOSIS — M1612 Unilateral primary osteoarthritis, left hip: Secondary | ICD-10-CM

## 2016-11-18 NOTE — Assessment & Plan Note (Signed)
Assessment: Healthcare maintenance Patient is due for colon cancer screening. We discussed doing a colonoscopy versus Fit test. Patient decided on fit test.  [place] test

## 2016-11-18 NOTE — Assessment & Plan Note (Signed)
Assessment: Essential hypertension Patient is not currently taking any blood pressure medications despite being prescribed HCTZ in the past. Her blood pressure today was 131/69 stable and at goal.  Will continue to monitor without medication at this point.  Plan -monitor, reassess at next clinic visit

## 2016-11-18 NOTE — Patient Instructions (Signed)
Brandi Chase,  It was a pleasure seeing you today. I'm happy to see you doing better. Keep up the good work! Please follow up in 6 months for your regular clinic visit.

## 2016-11-18 NOTE — Progress Notes (Signed)
   CC: follow up on essential hypertension and osteoarthritis of left hip status post left total hip replacement in April 2018  HPI:  [name] is a 51 y.o. female with history noted below the presents of the internal medicine clinic for follow-up of her essential hypertension and osteoarthritis of left hip status post left total hip replacement.  Please see problem based charting for the status of patients chronic medical conditions.  Past Medical History:  Diagnosis Date  . Adnexal cyst 2011   removed  . Anemia   . Arthritis    "neck; spine" (08/29/2014)  . Asthma   . Carpal tunnel syndrome of left wrist   . Chronic lower back pain   . Complication of anesthesia    difficult to awake  . Daily headache 12/2013   "tx'd and no problems anymore" (08/29/2014)  . Dyspnea    with asthma attack- none recent  . Family history of adverse reaction to anesthesia    "1 sister and both parents hard to wake up"  . Heart murmur    "dx'd when I was a teenager; haven't had any problems w/it"  . Hip pain, right   . History of bronchitis   . Hypertension   . Urinary incontinence   . Urinary urgency   . UTI (lower urinary tract infection)     Review of Systems:  Review of Systems  Respiratory: Negative for shortness of breath.   Cardiovascular: Negative for chest pain.  Musculoskeletal: Negative for falls.     Physical Exam:  Vitals:   11/18/16 1424  BP: 131/69  Pulse: 74  Temp: 98.4 F (36.9 C)  SpO2: 100%  Weight: 221 lb 14.4 oz (100.7 kg)  Height: 5\' 2"  (1.575 m)   Physical Exam  Constitutional: She is well-developed, well-nourished, and in no distress.  Cardiovascular: Normal rate, regular rhythm and normal heart sounds.  Exam reveals no gallop and no friction rub.   No murmur heard. Pulmonary/Chest: Effort normal and breath sounds normal. No respiratory distress. She has no wheezes. She has no rales.  Skin: Skin is warm and dry.    Assessment & Plan:   See  encounters tab for problem based medical decision making.    Patient discussed with Dr. Dareen Piano

## 2016-11-18 NOTE — Assessment & Plan Note (Signed)
Assessment: osteoarthritis of the left hip s/p left total hip replacement Patient has had right and left total hip replacements.  Her left total hip replacement was in April 2018 and her right total hip replacement was in 11/2015.  Prior to her left hip replacement her pain was being managed with Percocet.  Today I'm impressed with patient's improvement in pain and functional status. Prior to her hip replacements patient would use a wheelchair to be brought into clinic. She currently is able to walk with minimal pain and states that her previous knee pain is resolved as well. Plan was to transition her back to tramadol as this was being used prior to her hip surgeries to help with her knee pain. Today she states that she no longer needs narcotic medication as her hip surgeries have helped with overall pain and function. Recommended ibuprofen as needed for when she does have pain.  Plan -Ibuprofen as needed

## 2016-11-19 NOTE — Progress Notes (Signed)
Internal Medicine Clinic Attending  Case discussed with Dr. Hoffman at the time of the visit.  We reviewed the resident's history and exam and pertinent patient test results.  I agree with the assessment, diagnosis, and plan of care documented in the resident's note.  

## 2016-12-29 ENCOUNTER — Encounter: Payer: Self-pay | Admitting: Internal Medicine

## 2016-12-29 ENCOUNTER — Ambulatory Visit (INDEPENDENT_AMBULATORY_CARE_PROVIDER_SITE_OTHER): Payer: Self-pay | Admitting: Internal Medicine

## 2016-12-29 VITALS — BP 147/80 | HR 92 | Temp 98.2°F | Wt 223.6 lb

## 2016-12-29 DIAGNOSIS — J029 Acute pharyngitis, unspecified: Secondary | ICD-10-CM

## 2016-12-29 LAB — RAPID STREP SCREEN (MED CTR MEBANE ONLY): Streptococcus, Group A Screen (Direct): NEGATIVE

## 2016-12-29 MED ORDER — AMOXICILLIN-POT CLAVULANATE 500-125 MG PO TABS: 1.0000 | ORAL_TABLET | Freq: Two times a day (BID) | ORAL | 0 refills | Status: DC

## 2016-12-29 MED ORDER — AMOXICILLIN-POT CLAVULANATE 875-125 MG PO TABS: 1.0000 | ORAL_TABLET | Freq: Two times a day (BID) | ORAL | 0 refills | Status: AC

## 2016-12-29 NOTE — Patient Instructions (Addendum)
Ms. Heslin,  It was a pleasure seeing you today. Please start taking your antibiotics twice a day for the next 7 days for your sore throat. Please call the clinic if your symptoms worsen.

## 2016-12-29 NOTE — Progress Notes (Signed)
   CC: sore throat  HPI:  [name] is a 51 y.o. female with history noted below that presents to the acute care clinic for 3 day history of sore throat worse on the right side.  She reports an elevated temperature of 99 last night with associated chills. She has tried ibuprofen and warm water with little benefit. She denies any difficulty swallowing, Muffled voice, respiratory issues, or cough.      Past Medical History:  Diagnosis Date  . Adnexal cyst 2011   removed  . Anemia   . Arthritis    "neck; spine" (08/29/2014)  . Asthma   . Carpal tunnel syndrome of left wrist   . Chronic lower back pain   . Complication of anesthesia    difficult to awake  . Daily headache 12/2013   "tx'd and no problems anymore" (08/29/2014)  . Dyspnea    with asthma attack- none recent  . Family history of adverse reaction to anesthesia    "1 sister and both parents hard to wake up"  . Heart murmur    "dx'd when I was a teenager; haven't had any problems w/it"  . Hip pain, right   . History of bronchitis   . Hypertension   . Urinary incontinence   . Urinary urgency   . UTI (lower urinary tract infection)     Review of Systems:  ROS As noted per HPI   Physical Exam:  Vitals:   12/29/16 1524  BP: (!) 147/80  Pulse: 92  Temp: 98.2 F (36.8 C)  TempSrc: Oral  SpO2: 99%  Weight: 223 lb 9.6 oz (101.4 kg)   Physical Exam  Constitutional: She is well-developed, well-nourished, and in no distress.  Neck: Normal range of motion. Neck supple.  Tender/swollen anterior cervical lymph nodes with white exudate on right.  Pulmonary/Chest: Effort normal. No stridor. No respiratory distress. She has no wheezes. She has no rales.  Musculoskeletal: She exhibits no edema.  Skin: Skin is warm and dry.    Assessment & Plan:   See encounters tab for problem based medical decision making.   Patient discussed with Dr. Dareen Piano

## 2016-12-31 DIAGNOSIS — J029 Acute pharyngitis, unspecified: Secondary | ICD-10-CM | POA: Insufficient documentation

## 2016-12-31 LAB — CULTURE, GROUP A STREP (THRC)

## 2016-12-31 NOTE — Assessment & Plan Note (Signed)
Assessment: Acute pharyngitis Patient has a 3 day history of sore throat with  cervical tenderness and exudate worse on the right. Patient denies any difficulty swallowing, respiratory issues, muffled voice. Patient denies cough. Concern for strep throat. Will obtain a rapid strep test in the office.  Will treat with Augmentin.  Plan -Rapid strep test - Augmentin -Return precautions given

## 2017-01-03 NOTE — Addendum Note (Signed)
Addended by: Aldine Contes on: 01/03/2017 09:25 AM   Modules accepted: Level of Service

## 2017-01-03 NOTE — Progress Notes (Signed)
Internal Medicine Clinic Attending  Case discussed with Dr. Hoffman at the time of the visit.  We reviewed the resident's history and exam and pertinent patient test results.  I agree with the assessment, diagnosis, and plan of care documented in the resident's note.  

## 2017-04-26 ENCOUNTER — Ambulatory Visit (INDEPENDENT_AMBULATORY_CARE_PROVIDER_SITE_OTHER): Payer: Self-pay | Admitting: Internal Medicine

## 2017-04-26 ENCOUNTER — Other Ambulatory Visit: Payer: Self-pay

## 2017-04-26 ENCOUNTER — Encounter: Payer: Self-pay | Admitting: Internal Medicine

## 2017-04-26 VITALS — BP 152/83 | HR 97 | Temp 97.9°F | Ht 62.0 in | Wt 255.5 lb

## 2017-04-26 DIAGNOSIS — Z96643 Presence of artificial hip joint, bilateral: Secondary | ICD-10-CM

## 2017-04-26 DIAGNOSIS — I10 Essential (primary) hypertension: Secondary | ICD-10-CM

## 2017-04-26 DIAGNOSIS — M549 Dorsalgia, unspecified: Secondary | ICD-10-CM

## 2017-04-26 DIAGNOSIS — M62831 Muscle spasm of calf: Secondary | ICD-10-CM | POA: Insufficient documentation

## 2017-04-26 DIAGNOSIS — Z0000 Encounter for general adult medical examination without abnormal findings: Secondary | ICD-10-CM

## 2017-04-26 MED ORDER — TIZANIDINE HCL 2 MG PO TABS: 2.0000 mg | ORAL_TABLET | Freq: Four times a day (QID) | ORAL | 0 refills | Status: DC | PRN

## 2017-04-26 NOTE — Assessment & Plan Note (Signed)
Assessment:  Healthcare maintenance  Patient is due for Pap smear and this was offered in office.  Patient agreeable to exam. Patient would like a referral to dentistry for teeth cleaning.  Plan -Pap smear in office -dentist referral

## 2017-04-26 NOTE — Patient Instructions (Addendum)
Ms. Currier,  Is a pleasure seeing you today. Medications have been refilled. A referral to dentistry has been made. Please follow-up with me in the next 3-6 months or in the Acute care clinic as needed.

## 2017-04-26 NOTE — Assessment & Plan Note (Signed)
Assessment: Bilateral calve muscle spasms Patient states that she has calf muscle spasms that last for under a minute. She states that Zanaflex helps with these spasms. Discussed using mustard as another option to help with the spasms. Will refill Zanaflex at this time.  Plan -Refill Zanaflex

## 2017-04-26 NOTE — Assessment & Plan Note (Signed)
Assessment: Essential hypertension Her blood pressure today is 152/83 and 148/89 on repeat.  Patient is not currently taking any blood pressure medications despite being prescribed HCTZ in the past.  Patient states she does not want to start blood pressure medications at this time.  Plan -Follow up blood pressure at next visit

## 2017-04-26 NOTE — Assessment & Plan Note (Signed)
Assessment:  History of bilateral hip replacement Patient would like a referral to physical therapy to discuss exercises that are safe for her to do.  Plan -physical therapy referral

## 2017-04-26 NOTE — Progress Notes (Signed)
   CC: Follow up on essential hypertension  HPI:  Brandi Chase is a 52 y.o. female with history noted below that presents to the acute care clinic for follow-up of essential hypertension. Please see problem based charting for the status of patient's chronic medical conditions.  Past Medical History:  Diagnosis Date  . Adnexal cyst 2011   removed  . Anemia   . Arthritis    "neck; spine" (08/29/2014)  . Asthma   . Carpal tunnel syndrome of left wrist   . Chronic lower back pain   . Complication of anesthesia    difficult to awake  . Daily headache 12/2013   "tx'd and no problems anymore" (08/29/2014)  . Dyspnea    with asthma attack- none recent  . Family history of adverse reaction to anesthesia    "1 sister and both parents hard to wake up"  . Heart murmur    "dx'd when I was a teenager; haven't had any problems w/it"  . Hip pain, right   . History of bronchitis   . Hypertension   . Urinary incontinence   . Urinary urgency   . UTI (lower urinary tract infection)     Review of Systems:  Review of Systems  Constitutional: Negative for malaise/fatigue.  Respiratory: Negative for shortness of breath.   Cardiovascular: Negative for chest pain.  Musculoskeletal: Positive for back pain.     Physical Exam:  Vitals:   04/26/17 1500  BP: (!) 152/83  Pulse: 97  Temp: 97.9 F (36.6 C)  TempSrc: Oral  SpO2: 100%  Weight: 255 lb 8 oz (115.9 kg)  Height: 5\' 2"  (1.575 m)   Physical Exam  Constitutional: She is well-developed, well-nourished, and in no distress.  Cardiovascular: Normal rate, regular rhythm and normal heart sounds. Exam reveals no gallop and no friction rub.  No murmur heard. Pulmonary/Chest: Effort normal and breath sounds normal. No respiratory distress. She has no wheezes. She has no rales.  Skin: Skin is warm and dry.    Assessment & Plan:   See encounters tab for problem based medical decision making.    Patient discussed with Dr.  Evette Doffing

## 2017-04-28 LAB — CYTOLOGY - PAP
Diagnosis: NEGATIVE
HPV: NOT DETECTED

## 2017-04-28 NOTE — Progress Notes (Signed)
Internal Medicine Clinic Attending  Case discussed with Dr. Hoffman at the time of the visit.  We reviewed the resident's history and exam and pertinent patient test results.  I agree with the assessment, diagnosis, and plan of care documented in the resident's note.  

## 2017-05-03 ENCOUNTER — Telehealth: Payer: Self-pay | Admitting: Internal Medicine

## 2017-05-03 NOTE — Telephone Encounter (Signed)
CALLED PATIENT, LMTCB, IT IS TIME TO RENEW [facility] CARD °

## 2017-05-05 ENCOUNTER — Telehealth: Payer: Self-pay | Admitting: Internal Medicine

## 2017-05-05 NOTE — Telephone Encounter (Signed)
Tried to call pt, lm fo rtc

## 2017-05-05 NOTE — Telephone Encounter (Signed)
Patient is a her teeth clean and she is needing some medicine

## 2017-05-06 ENCOUNTER — Telehealth: Payer: Self-pay | Admitting: Internal Medicine

## 2017-05-06 NOTE — Telephone Encounter (Signed)
NEED MEDICATION FOR DENTAL APPT, ON 05/12/17 SOME TYPE ANTIBOITIC, TO Suzie Portela [phone]

## 2017-05-06 NOTE — Telephone Encounter (Signed)
Needs abx for dental work due to hip replacements and spinal surgery

## 2017-05-07 NOTE — Telephone Encounter (Signed)
Discussed with patient last visit she was having dental cleaning.   What kind of dental work is she having?  There is no indication for antibiotics prior to dental cleaning.

## 2017-05-10 ENCOUNTER — Ambulatory Visit: Payer: Self-pay | Attending: Internal Medicine | Admitting: Physical Therapy

## 2017-05-10 ENCOUNTER — Other Ambulatory Visit: Payer: Self-pay

## 2017-05-10 DIAGNOSIS — M25551 Pain in right hip: Secondary | ICD-10-CM | POA: Insufficient documentation

## 2017-05-10 DIAGNOSIS — R262 Difficulty in walking, not elsewhere classified: Secondary | ICD-10-CM | POA: Insufficient documentation

## 2017-05-10 DIAGNOSIS — M6281 Muscle weakness (generalized): Secondary | ICD-10-CM | POA: Insufficient documentation

## 2017-05-10 NOTE — Therapy (Signed)
[facility] [address] [place], [place], [address] Phone: [phone]   Fax:  [phone]  Physical Therapy Evaluation  Patient Details  Name: Brandi Chase MRN: [id number] Date of Birth: Jun 05, 1965 Referring Provider: Lizabeth Leyden   Encounter Date: 05/10/2017  PT End of Session - 05/10/17 1513    Visit Number  1    Authorization Type  has [place], she is to reapply, it currently ends 05/14/17    PT Start Time  1445    PT Stop Time  1530    PT Time Calculation (min)  45 min    Activity Tolerance  Patient tolerated treatment well    Behavior During Therapy  [facility] for tasks assessed/performed       Past Medical History:  Diagnosis Date  . Adnexal cyst 2011   removed  . Anemia   . Arthritis    "neck; spine" (08/29/2014)  . Asthma   . Carpal tunnel syndrome of left wrist   . Chronic lower back pain   . Complication of anesthesia    difficult to awake  . Daily headache 12/2013   "tx'd and no problems anymore" (08/29/2014)  . Dyspnea    with asthma attack- none recent  . Family history of adverse reaction to anesthesia    "1 sister and both parents hard to wake up"  . Heart murmur    "dx'd when I was a teenager; haven't had any problems w/it"  . Hip pain, right   . History of bronchitis   . Hypertension   . Urinary incontinence   . Urinary urgency   . UTI (lower urinary tract infection)     Past Surgical History:  Procedure Laterality Date  . BACK SURGERY     x2  . JOINT REPLACEMENT Right 11/2015   hip  . OVARIAN CYST REMOVAL Right 2011  . REDUCTION MAMMAPLASTY Bilateral 1995  . 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  . 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   Procedure: TOTAL HIP ARTHROPLASTY ANTERIOR APPROACH;  Surgeon: Frederik Pear, MD;  Location: [facility];  Service: Orthopedics;  Laterality: Right;  . TOTAL HIP ARTHROPLASTY Left 07/19/2016   Procedure: TOTAL HIP ARTHROPLASTY ANTERIOR APPROACH;   Surgeon: Frederik Pear, MD;  Location: [facility];  Service: Orthopedics;  Laterality: Left;  . TRANSFORAMINAL LUMBAR INTERBODY FUSION (TLIF) WITH PEDICLE SCREW FIXATION 2 LEVEL N/A 08/29/2014   Procedure: TRANSFORAMINAL LUMBAR INTERBODY FUSION (TLIF) L4-S1;  Surgeon: Melina Schools, MD;  Location: [facility];  Service: Orthopedics;  Laterality: N/A;  . TUBAL LIGATION  1992    There were no vitals filed for this visit.   Subjective Assessment - 05/10/17 1447    Subjective  Patient reports that she had a back surgery in 2016, right hip in 2017, left hip replaced in 2018.  She reports that over the past few months she has had some difficulty sleeping due to some right posterior hip pain, she also mentions that she feels like she has gained weight in the inner thighs and that it is recent, she reports lymphedema was ruled out.    Patient Stated Goals  lose some weight and sleep better.    Currently in Pain?  Yes    Pain Score  6     Pain Location  Hip    Pain Orientation  Right;Posterior    Pain Descriptors / Indicators  Aching;Sore    Pain Type  Acute pain    Pain Onset  More than a month ago    Pain Frequency  Intermittent    Aggravating Factors   lie down at night the pain will increase up to 9/10 by the morning from tossing and turning     Pain Relieving Factors  ibuprofen, changing positions    Effect of Pain on Daily Activities  just tired all the time now, poor sleep, some difficulty walking         [facility] PT Assessment - 05/10/17 0001      Assessment   Medical Diagnosis  right hip pain, difficuklty walking    Referring Provider  Lizabeth Leyden    Prior Therapy  Home PT after the [facility]      Precautions   Precautions  Anterior Hip      Balance Screen   Has the patient fallen in the past 6 months  No    Has the patient had a decrease in activity level because of a fear of falling?   No    Is the patient reluctant to leave their home because of a fear of falling?   No      Home  Environment   Additional Comments  has stairs, does housework      Prior Function   Level of Independence  Independent    Vocation  Unemployed    Leisure  was walking 20 minutes but has really limited this      Observation/Other Assessments-Edema    Edema  -- c/o gaining weight in the inner thighs, c/o left > right      ROM / Strength   AROM / PROM / Strength  AROM;Strength      AROM   Overall AROM Comments  Lumbar ROM decreased 50%, hip flexion was 60 degrees bilaterally, abduction was 10 degrees, she reports being fearful      Strength   Overall Strength Comments  right hip 4/5, left hip 3+/5      Palpation   Palpation comment  she is very tender in the right SI and buttock area, she reports that this is the spot that wakes her up at night      Ambulation/Gait   Gait Comments  mild antalgic gait on the right             Objective measurements completed on examination: See above findings.      [facility] PT Treatment/Exercise - 05/10/17 0001      Modalities   Modalities  Electrical Stimulation;Moist Heat      Moist Heat Therapy   Number Minutes Moist Heat  15 Minutes    Moist Heat Location  Hip      Electrical Stimulation   Electrical Stimulation Location  right SI area    Electrical Stimulation Action  IFC    Electrical Stimulation Parameters  sitting    Electrical Stimulation Goals  Pain             PT Education - 05/10/17 1512    Education provided  Yes    Education Details  HS and piriformis stretch    Person(s) Educated  Patient    Methods  Explanation;Demonstration;Handout    Comprehension  Verbalized understanding       PT Short Term Goals - 05/10/17 1518      PT SHORT TERM GOAL #1   Title  independent iwth initial HEP    Time  1    Period  Weeks    Status  New  PT Long Term Goals - 05/10/17 1518      PT LONG TERM GOAL #1   Title  decrease pain 50% at night    Time  8    Period  Weeks    Status  New      PT LONG  TERM GOAL #2   Title  reports sleeping >30 minutes at a time    Time  8    Period  Weeks    Status  New      PT LONG TERM GOAL #3   Title  increase left hip strength to 4-/5    Time  8    Period  Weeks    Status  New             Plan - 05/10/17 1514    Clinical Impression Statement  Patient has had a lumbar surgery in 2016, both hips were replaced one in 2017 and one in 2018, she is here today due to right hip and buttock pain that causes her not to sleep, she reports that she has gained 30 # in the past 6 months.  She c/o a lot of weight gain in her inner thighs, she reports that lymphedema was ruled out.  Her lumbar and hip ROM was decreased, she has a lot of spasms in the buttocks and the lumbar parapsinals.    Clinical Presentation  Stable    Clinical Decision Making  Low    Rehab Potential  Good    PT Frequency  1x / week    PT Duration  8 weeks    PT Treatment/Interventions  ADLs/Self Care Home Management;Electrical Stimulation;Moist Heat;Ultrasound;Therapeutic activities;Therapeutic exercise;Manual techniques;Patient/family education    PT Next Visit Plan  If she is approved would love to se about a TENS for her and start her on gym exercises for hip and core strength    Consulted and Agree with Plan of Care  Patient       Patient will benefit from skilled therapeutic intervention in order to improve the following deficits and impairments:  Abnormal gait, Decreased range of motion, Difficulty walking, Increased muscle spasms, Pain, Impaired flexibility, Decreased strength  Visit Diagnosis: Pain in right hip - Plan: PT plan of care cert/re-cert  Muscle weakness (generalized) - Plan: PT plan of care cert/re-cert  Difficulty in walking, not elsewhere classified - Plan: PT plan of care cert/re-cert     Problem List Patient Active Problem List   Diagnosis Date Noted  . Muscle spasm of calf 04/26/2017  . Pharyngitis 12/31/2016  . Urinary incontinence 08/15/2016  .  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 Primary osteoarthritis of left hip 06/20/2016  . Localized osteoarthrosis of right hip 11/26/2015  . Solar lentigo 11/13/2015  . Iron deficiency anemia due to chronic blood loss 08/15/2015  . DUB (dysfunctional uterine bleeding) 08/15/2015  . Uterus, adenomyosis 07/25/2015  . 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 Essential hypertension 04/17/2015  . Health care maintenance 04/17/2015  . H/O pelvic mass 05/24/2013  . Allergic rhinitis 12/11/2010  . Asthma 09/12/2008  . Primary osteoarthritis involving multiple joints 09/12/2008    Sumner Boast., PT 05/10/2017, 3:23 PM  [address] [place], [place], [address] Phone: [phone]   Fax:  [phone]  Name: Brandi Chase MRN: [id number] Date of Birth: [birth year]

## 2017-05-11 NOTE — Telephone Encounter (Signed)
She stated it is for dental cleaning, she had hip replacement surg x 2

## 2017-05-11 NOTE — Telephone Encounter (Signed)
Who recommended the antibiotics?  There is no indication of antibiotics in hip replacement surgery patients prior to dental cleaning.

## 2017-05-12 ENCOUNTER — Ambulatory Visit: Payer: Self-pay

## 2017-05-12 NOTE — Telephone Encounter (Signed)
Spoke w/ dr Software engineer, spoke w/ pt, advised to call her ortho for script if he still recommends abx, she states after conversation w/ triage nurse she did call and he stated yes she needed them, she is advised to call his office for the script, she is agreeable

## 2017-05-12 NOTE — Telephone Encounter (Signed)
Her ortho told her when the hip replacement was done, she is here now

## 2017-06-02 ENCOUNTER — Ambulatory Visit: Payer: Self-pay

## 2017-06-26 ENCOUNTER — Emergency Department (HOSPITAL_COMMUNITY)
Admission: EM | Admit: 2017-06-26 | Discharge: 2017-06-27 | Disposition: A | Discharge status: Home / Self Care | Payer: Self-pay | Attending: Emergency Medicine | Admitting: Emergency Medicine

## 2017-06-26 ENCOUNTER — Other Ambulatory Visit: Payer: Self-pay

## 2017-06-26 ENCOUNTER — Encounter (HOSPITAL_COMMUNITY): Payer: Self-pay | Admitting: *Deleted

## 2017-06-26 DIAGNOSIS — R202 Paresthesia of skin: Secondary | ICD-10-CM

## 2017-06-26 DIAGNOSIS — Z79899 Other long term (current) drug therapy: Secondary | ICD-10-CM | POA: Insufficient documentation

## 2017-06-26 DIAGNOSIS — J45909 Unspecified asthma, uncomplicated: Secondary | ICD-10-CM | POA: Insufficient documentation

## 2017-06-26 DIAGNOSIS — Z96643 Presence of artificial hip joint, bilateral: Secondary | ICD-10-CM | POA: Insufficient documentation

## 2017-06-26 DIAGNOSIS — M79642 Pain in left hand: Secondary | ICD-10-CM | POA: Insufficient documentation

## 2017-06-26 DIAGNOSIS — R030 Elevated blood-pressure reading, without diagnosis of hypertension: Secondary | ICD-10-CM

## 2017-06-26 DIAGNOSIS — Z91040 Latex allergy status: Secondary | ICD-10-CM | POA: Insufficient documentation

## 2017-06-26 DIAGNOSIS — M79641 Pain in right hand: Secondary | ICD-10-CM | POA: Insufficient documentation

## 2017-06-26 DIAGNOSIS — I10 Essential (primary) hypertension: Secondary | ICD-10-CM | POA: Insufficient documentation

## 2017-06-26 NOTE — ED Provider Notes (Signed)
[place] EMERGENCY DEPARTMENT Provider Note   CSN: [id number] Arrival date & time: 06/26/17  2426     History   Chief Complaint Chief Complaint  Patient presents with  . Arm Pain    HPI Brandi Chase is a 52 y.o. female.  HPI   Patient is a 52 year old female with a history of asthma, headaches, dysfunctional uterine bleeding, and lumbar fusions and L4-L5 presenting for bilateral hand "tingling and burning" that occurs primarily when she is lying down.  Patient reports that her hands will feel stiff primarily the left hand when she wakes up in the morning.  Patient reports this is primarily been occurring since January.  Patient reports that her thumb and middle finger of her left hand are most usually affected and her middle finger of her right hand is usually affected.  Patient does report that she has some "tingling of her left arm at the same time.  Patient denies any muscular weakness of the upper extremities, loss of sensation, neck pain, history of cancer, IVDU, fever or chills since the symptoms have occurred, or sudden sharp sensation with rapid flexion of the neck.  Past Medical History:  Diagnosis Date  . Adnexal cyst 2011   removed  . Anemia   . Arthritis    "neck; spine" (08/29/2014)  . Asthma   . Carpal tunnel syndrome of left wrist   . Chronic lower back pain   . Complication of anesthesia    difficult to awake  . Daily headache 12/2013   "tx'd and no problems anymore" (08/29/2014)  . Dyspnea    with asthma attack- none recent  . Family history of adverse reaction to anesthesia    "1 sister and both parents hard to wake up"  . Heart murmur    "dx'd when I was a teenager; haven't had any problems w/it"  . Hip pain, right   . History of bronchitis   . Hypertension   . Urinary incontinence   . Urinary urgency   . UTI (lower urinary tract infection)     Patient Active Problem List   Diagnosis Date Noted  . Muscle spasm of calf  04/26/2017  . Pharyngitis 12/31/2016  . Urinary incontinence 08/15/2016  . 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 Primary osteoarthritis of left hip 06/20/2016  . Localized osteoarthrosis of right hip 11/26/2015  . Solar lentigo 11/13/2015  . Iron deficiency anemia due to chronic blood loss 08/15/2015  . DUB (dysfunctional uterine bleeding) 08/15/2015  . Uterus, adenomyosis 07/25/2015  . 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 Essential hypertension 04/17/2015  . Health care maintenance 04/17/2015  . H/O pelvic mass 05/24/2013  . Allergic rhinitis 12/11/2010  . Asthma 09/12/2008  . Primary osteoarthritis involving multiple joints 09/12/2008    Past Surgical History:  Procedure Laterality Date  . BACK SURGERY     x2  . JOINT REPLACEMENT Right 11/2015   hip  . OVARIAN CYST REMOVAL Right 2011  . REDUCTION MAMMAPLASTY Bilateral 1995  . 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  . 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   Procedure: TOTAL HIP ARTHROPLASTY ANTERIOR APPROACH;  Surgeon: Frederik Pear, MD;  Location: [facility];  Service: Orthopedics;  Laterality: Right;  . TOTAL HIP ARTHROPLASTY Left 07/19/2016   Procedure: TOTAL HIP ARTHROPLASTY ANTERIOR APPROACH;  Surgeon: Frederik Pear, MD;  Location: [facility];  Service: Orthopedics;  Laterality: Left;  . TRANSFORAMINAL LUMBAR INTERBODY FUSION (TLIF) WITH PEDICLE SCREW FIXATION 2 LEVEL N/A 08/29/2014  Procedure: TRANSFORAMINAL LUMBAR INTERBODY FUSION (TLIF) L4-S1;  Surgeon: Melina Schools, MD;  Location: [facility];  Service: Orthopedics;  Laterality: N/A;  . TUBAL LIGATION  1992    OB History    Gravida Para Term Preterm AB Living   3 3       3    SAB TAB Ectopic Multiple Live Births                   Home Medications    Prior to Admission medications   Medication Sig Start Date End Date Taking? Authorizing Provider  albuterol (PROVENTIL HFA;VENTOLIN HFA) 108 (90 Base) MCG/ACT inhaler Inhale 2 puffs into the lungs every 6 (six)  hours as needed for wheezing or shortness of breath. 11/13/15   Kalman Shan Ratliff, DO  ibuprofen (ADVIL,MOTRIN) 800 MG tablet Take 1 tablet (800 mg total) by mouth every 8 (eight) hours as needed. Patient taking differently: Take 800 mg by mouth every 8 (eight) hours as needed for moderate pain.  06/17/16   Kalman Shan Ratliff, DO  tiZANidine (ZANAFLEX) 2 MG tablet Take 1 tablet (2 mg total) by mouth every 6 (six) hours as needed for muscle spasms. 04/26/17   Kalman Shan Ratliff, DO  POTASSIUM PO Take 1 tablet by mouth every morning. Over the counter product (does not know strength  11/13/15  [provider]    Family History No family history on file.  Social History Social History   Tobacco Use  . Smoking status: Never Smoker  . Smokeless tobacco: Never Used  Substance Use Topics  . Alcohol use: No  . Drug use: No     Allergies   Other; Primatene mist [epinephrine]; Shrimp [shellfish allergy]; Aleve [naproxen sodium]; Bactrim ds [sulfamethoxazole w/trimethoprim (co-trimoxazole)]; Midol [ibuprofen]; Avocado; Cocoa butter; and Latex   Review of Systems Review of Systems  Constitutional: Negative for chills and fever.  Musculoskeletal: Negative for neck pain and neck stiffness.  Skin: Negative for color change.  Allergic/Immunologic: Negative for immunocompromised state.  Neurological: Negative for weakness and numbness.       +Paresthesias     Physical Exam Updated Vital Signs BP (!) 161/86 (BP Location: Right Arm)   Pulse 79   Temp 98 F (36.7 C) (Oral)   Resp 16   Ht 5\' 2"  (1.575 m)   Wt 99.8 kg (220 lb)   LMP 05/11/2017   SpO2 100%   BMI 40.24 kg/m   Physical Exam  Constitutional: She appears well-developed and well-nourished. No distress.  Sitting comfortably in bed.  HENT:  Head: Normocephalic and atraumatic.  Eyes: Conjunctivae are normal. Right eye exhibits no discharge. Left eye exhibits no discharge.  EOMs normal to gross  examination.  Neck: Normal range of motion.  Cardiovascular: Normal rate and regular rhythm.  Intact, 2+ radial and ulnar pulse bilaterally.  Pulmonary/Chest:  Normal respiratory effort. Patient converses comfortably. No audible wheeze or stridor.  Abdominal: She exhibits no distension.  Musculoskeletal: Normal range of motion.  Neurological: She is alert.  Cervical Spine Exam: PALPATION: No midline or paraspinal musculature tenderness of cervical and thoracic spine. ROM of cervical spine intact with flexion/extension/lateral flexion/lateral rotation MOTOR: 5/5 strength b/l with resisted shoulder abduction/adduction, biceps flexion (C5/6), biceps extension (C6-C8), wrist flexion, wrist extension (C6-C8), and grip strength (C7-T1) 2+ DTRs in the biceps, triceps, and brachioradialis SENSORY: Sensation is intact to light and sharp touch in:  Superficial radial nerve distribution (dorsal first web space) Median nerve distribution (tip of index finger)  Ulnar nerve distribution (tip of small finger)  Patient moves LEs symmetrically and with good coordination. Patient ambulates symmetrically with no evidence of LE weakness. Normal gait and balance. Positive Phalen's test (reproduce symptoms in LUE) Negative Hoffman's test bilaterally.  Skin: Skin is warm and dry. She is not diaphoretic.  Psychiatric: She has a normal mood and affect. Her behavior is normal. Judgment and thought content normal.  Nursing note and vitals reviewed.    ED Treatments / Results  Labs (all labs ordered are listed, but only abnormal results are displayed) Labs Reviewed  POC URINE PREG, ED    EKG  EKG Interpretation None       Radiology No results found.  Procedures Procedures (including critical care time)  Medications Ordered in ED Medications - No data to display   Initial Impression / Assessment and Plan / ED Course  I have reviewed the triage vital signs and the nursing notes.  Pertinent  labs & imaging results that were available during my care of the patient were reviewed by me and considered in my medical decision making (see chart for details).     Patient is nontoxic appearing and in NAD. No neck pain associated with hand paresthesias. No signs or symptoms of myelopathy. No red flag signs or symptoms for cervical spine cause of patient's symptoms. No indication for emergent MRI of cervical spine. Left hand symptoms, which are more persistent consistent with carpal tunnel syndrome. Patient given wrist splint to keep wrist in slight extension at night. Patient instructed to follow up with her orthopedic physician for further management should MRI of neck be warranted. Patient given return precautions for any loss of grip strength, loss of sensation in the hands, spreading of the sensation to greater distribution of the hand, fever or chills, or lower extremity symptoms such as difficulty walking, numbness, or weakness in the legs, loss of bowel bladder control, saddle anesthesia. Patient is in understanding and agrees with the plan of care.  Final Clinical Impressions(s) / ED Diagnoses   Final diagnoses:  Paresthesias in left hand  Paresthesias in right hand  Elevated blood pressure reading    ED Discharge Orders    None       Tamala Julian 06/27/17 2595    Elnora Morrison, MD 06/28/17 [phone]

## 2017-06-26 NOTE — ED Triage Notes (Signed)
Pt states bil had pain (burning and tingling) when she lays down at night.  Hx of spinal fusion in 2016.  States came in today b/c she brought her sister in, so she figured she should be seen as well.

## 2017-06-27 NOTE — Discharge Instructions (Addendum)
Please see the information and instructions below regarding your visit.  Your diagnoses today include:  1. Paresthesias in left hand   2. Paresthesias in right hand   3. Elevated blood pressure reading     Tests performed today include: See side panel of your discharge paperwork for testing performed today. Vital signs are listed at the bottom of these instructions.   Medications prescribed:    Take any prescribed medications only as prescribed, and any over the counter medications only as directed on the packaging.  Please continue to use ibuprofen as anti-inflammatories.  Home care instructions:  Please follow any educational materials contained in this packet.   Please use the wrist splint provided on her left hand to try to splint to the wrist open while you sleep to prevent the sensation in the morning.  Follow-up instructions: Please follow-up with your primary care provider as soon as possible for further evaluation of your symptoms if they are not completely improved.   Please follow up with Dr. Mayer Camel as soon as possible to evaluate if you will need neck MRI.  Return instructions:  Please return to the Emergency Department if you experience worsening symptoms.  Please return to the emergency department if you develop neck pain with your symptoms, loss of grip strength with your symptoms, loss of sensation in the hands, spreading of the sensation to greater distribution of the hand, fever or chills with your symptoms, or lower extremity symptoms such as difficulty walking, numbness, or weakness in the legs, loss of bowel bladder control, loss of sensation in your groin. Please return if you have any other emergent concerns.  Additional Information:   Your vital signs today were: BP (!) 161/86 (BP Location: Right Arm)    Pulse 79    Temp 98 F (36.7 C) (Oral)    Resp 16    Ht 5\' 2"  (1.575 m)    Wt 99.8 kg (220 lb)    LMP 05/11/2017    SpO2 100%    BMI 40.24 kg/m  If your  blood pressure (BP) was elevated on multiple readings during this visit above 130 for the top number or above 80 for the bottom number, please have this repeated by your primary care provider within one month. --------------  Thank you for allowing [place] to participate in your care today.

## 2017-09-19 NOTE — Progress Notes (Signed)
   CC: Follow-up on essential hypertension  HPI:  Brandi Chase is a 52 y.o. female with history noted below that presents to the internal medicine clinic for follow-up on essential hypertension not currently on antihypertensives.  Please see problem based charting for the status of patient's chronic medical conditions.  Past Medical History:  Diagnosis Date  . Adnexal cyst 2011   removed  . Anemia   . Arthritis    "neck; spine" (08/29/2014)  . Asthma   . Carpal tunnel syndrome of left wrist   . Chronic lower back pain   . Complication of anesthesia    difficult to awake  . Daily headache 12/2013   "tx'd and no problems anymore" (08/29/2014)  . Dyspnea    with asthma attack- none recent  . Family history of adverse reaction to anesthesia    "1 sister and both parents hard to wake up"  . Heart murmur    "dx'd when I was a teenager; haven't had any problems w/it"  . Hip pain, right   . History of bronchitis   . Hypertension   . Urinary incontinence   . Urinary urgency   . UTI (lower urinary tract infection)     Review of Systems:  Review of Systems  Respiratory: Negative for shortness of breath.   Cardiovascular: Negative for chest pain.  Musculoskeletal: Negative for falls.  Neurological: Negative for focal weakness.     Physical Exam:  Vitals:   09/22/17 1522  BP: 131/67  Pulse: 80  Temp: 99.5 F (37.5 C)  TempSrc: Oral  SpO2: 99%  Weight: 231 lb 3.2 oz (104.9 kg)  Height: 5\' 2"  (1.575 m)   Physical Exam  Constitutional: She is well-developed, well-nourished, and in no distress.  Cardiovascular: Normal rate, regular rhythm and normal heart sounds. Exam reveals no gallop and no friction rub.  No murmur heard. Pulmonary/Chest: Effort normal and breath sounds normal. No respiratory distress. She has no wheezes. She has no rales.  Musculoskeletal: She exhibits no edema.  Bilateral Hand/Wrist:  No atrophy noted.  No effusion or erythema.  Wrists with  good range of motion.  2+ radial pulses intact bilaterally.    Skin: Skin is warm and dry.    Assessment & Plan:   See encounters tab for problem based medical decision making.   Patient discussed with Dr. Rebeca Alert

## 2017-09-22 ENCOUNTER — Encounter: Payer: Self-pay | Admitting: Internal Medicine

## 2017-09-22 ENCOUNTER — Other Ambulatory Visit: Payer: Self-pay

## 2017-09-22 ENCOUNTER — Ambulatory Visit (INDEPENDENT_AMBULATORY_CARE_PROVIDER_SITE_OTHER): Payer: Self-pay | Admitting: Internal Medicine

## 2017-09-22 DIAGNOSIS — R202 Paresthesia of skin: Secondary | ICD-10-CM

## 2017-09-22 DIAGNOSIS — I10 Essential (primary) hypertension: Secondary | ICD-10-CM

## 2017-09-22 MED ORDER — IBUPROFEN 600 MG PO TABS: 600.0000 mg | ORAL_TABLET | Freq: Four times a day (QID) | ORAL | 0 refills | Status: DC | PRN

## 2017-09-22 NOTE — Patient Instructions (Signed)
Brandi Chase,  It was a pleasure seeing you today. Please follow-up with your orthopedic surgeon to discuss options for your left hand numbness. Please follow-up as needed if not I will see you in 6 months.

## 2017-09-25 DIAGNOSIS — R202 Paresthesia of skin: Secondary | ICD-10-CM | POA: Insufficient documentation

## 2017-09-25 NOTE — Assessment & Plan Note (Signed)
Assessment:  Essential hypertension Not currently on antihypertensives.  Blood pressure today is 131/67.  Will continue to monitor.  Will not start BP meds at this time.  Plan - Continue to monitor blood pressure at following visits

## 2017-09-25 NOTE — Assessment & Plan Note (Signed)
Assessment: left hand paresthesias   Patient presents with a chronic history of left hand numbness and tingling that has recently gotten worse.  She reports symptoms in the median nerve distribution.  She denies weakness or trauma.  Exam normal.  Presentation most consistent with carpal tunnel syndrome.  She was evaluated in the ED to on 06/2017 given a wrist splint for her left hand that she reports benefit in her symptoms.  Patient has an orthopedic physician that she follows with for her bilateral hip replacements.  Recommended following up with orthopedic surgeon and continue to use wrist splint and ibuprofen as needed.   Plan -follow up with orthopedic surgery -wrist splint -ibuprofen

## 2017-09-29 ENCOUNTER — Ambulatory Visit: Payer: Self-pay

## 2017-10-01 NOTE — Progress Notes (Signed)
Internal Medicine Clinic Attending  Case discussed with Dr. Hoffman  at the time of the visit.  We reviewed the resident's history and exam and pertinent patient test results.  I agree with the assessment, diagnosis, and plan of care documented in the resident's note.  Alexander N Raines, MD   

## 2017-12-01 ENCOUNTER — Ambulatory Visit: Payer: Self-pay

## 2017-12-29 ENCOUNTER — Ambulatory Visit: Payer: Self-pay

## 2018-03-23 ENCOUNTER — Encounter: Payer: Self-pay | Admitting: Internal Medicine

## 2018-03-23 NOTE — Progress Notes (Deleted)
   CC: ***  HPI:  [name] is a 53 y.o.   Past Medical History:  Diagnosis Date  . Adnexal cyst 2011   removed  . Anemia   . Arthritis    "neck; spine" (08/29/2014)  . Asthma   . Carpal tunnel syndrome of left wrist   . Chronic lower back pain   . Complication of anesthesia    difficult to awake  . Daily headache 12/2013   "tx'd and no problems anymore" (08/29/2014)  . Dyspnea    with asthma attack- none recent  . Family history of adverse reaction to anesthesia    "1 sister and both parents hard to wake up"  . Heart murmur    "dx'd when I was a teenager; haven't had any problems w/it"  . Hip pain, right   . History of bronchitis   . Hypertension   . Urinary incontinence   . Urinary urgency   . UTI (lower urinary tract infection)     Review of Systems:  ***  Physical Exam:  There were no vitals filed for this visit. ***  Assessment & Plan:   See encounters tab for problem based medical decision making.  History of present illness: Not currently on antihypertensives Assessment: Essential hypertension Blood pressure today is  Plan -Continue to monitor blood pressure at following visits  Assessment: Healthcare maintenance Colonoscopy, mammogram and influenza vaccine  Patient {GC/GE:[id number]::"discussed with","seen with"} Dr. {NAMES:[id number]::"Butcher","Granfortuna","E. Izell Labat","Klima","Mullen","Narendra","Vincent"}

## 2018-03-27 ENCOUNTER — Ambulatory Visit (INDEPENDENT_AMBULATORY_CARE_PROVIDER_SITE_OTHER): Payer: PPO | Admitting: Internal Medicine

## 2018-03-27 ENCOUNTER — Encounter: Payer: Self-pay | Admitting: Internal Medicine

## 2018-03-27 ENCOUNTER — Other Ambulatory Visit (HOSPITAL_COMMUNITY)
Admission: RE | Admit: 2018-03-27 | Discharge: 2018-03-27 | Disposition: A | Discharge status: Home / Self Care | Payer: PPO | Source: Ambulatory Visit | Attending: Internal Medicine | Admitting: Internal Medicine

## 2018-03-27 VITALS — BP 136/81 | HR 100 | Temp 98.6°F | Wt 230.4 lb

## 2018-03-27 DIAGNOSIS — D509 Iron deficiency anemia, unspecified: Secondary | ICD-10-CM

## 2018-03-27 DIAGNOSIS — G2581 Restless legs syndrome: Secondary | ICD-10-CM | POA: Diagnosis not present

## 2018-03-27 DIAGNOSIS — Z8639 Personal history of other endocrine, nutritional and metabolic disease: Secondary | ICD-10-CM

## 2018-03-27 DIAGNOSIS — N760 Acute vaginitis: Secondary | ICD-10-CM

## 2018-03-27 DIAGNOSIS — N898 Other specified noninflammatory disorders of vagina: Secondary | ICD-10-CM

## 2018-03-27 DIAGNOSIS — Z0000 Encounter for general adult medical examination without abnormal findings: Secondary | ICD-10-CM | POA: Insufficient documentation

## 2018-03-27 LAB — POCT URINALYSIS DIPSTICK
Bilirubin, UA: NEGATIVE
Glucose, UA: NEGATIVE
Ketones, UA: NEGATIVE
Leukocytes, UA: NEGATIVE
NITRITE UA: NEGATIVE
PROTEIN UA: NEGATIVE
SPEC GRAV UA: 1.025 (ref 1.010–1.025)
UROBILINOGEN UA: 0.2 U/dL
pH, UA: 5 (ref 5.0–8.0)

## 2018-03-27 NOTE — Progress Notes (Signed)
   CC: vaginal discharge  HPI:  Brandi Chase is a 52 y.o. female with history noted below that presents to the acute care clinic for 3 week history of vaginal discharge. She states the discharge is white and has a fishy odor to it.  She also reports increase in urinary frequency.  She denies any dysuria or pruritus.  She states she is sexually active currently.  She states she has regular monthly periods and is supposed to start it tomorrow.  She also reports restless legs that have been at nighttime. She reports a history of iron deficiency anemia but is not taking iron supplements at this time.  Past Medical History:  Diagnosis Date  . Adnexal cyst 2011   removed  . Anemia   . Arthritis    "neck; spine" (08/29/2014)  . Asthma   . Carpal tunnel syndrome of left wrist   . Chronic lower back pain   . Complication of anesthesia    difficult to awake  . Daily headache 12/2013   "tx'd and no problems anymore" (08/29/2014)  . Dyspnea    with asthma attack- none recent  . Family history of adverse reaction to anesthesia    "1 sister and both parents hard to wake up"  . Heart murmur    "dx'd when I was a teenager; haven't had any problems w/it"  . Hip pain, right   . History of bronchitis   . Hypertension   . Urinary incontinence   . Urinary urgency   . UTI (lower urinary tract infection)     Review of Systems:  As noted per HPI  Physical Exam:  Vitals:   03/27/18 1507  BP: 136/81  Pulse: 100  Temp: 98.6 F (37 C)  TempSrc: Oral  SpO2: 100%  Weight: 230 lb 6.4 oz (104.5 kg)   Physical Exam  Constitutional: She is well-developed, well-nourished, and in no distress.  Cardiovascular: Normal rate, regular rhythm and normal heart sounds. Exam reveals no gallop and no friction rub.  No murmur heard. Pulmonary/Chest: Effort normal and breath sounds normal. No respiratory distress. She has no wheezes. She has no rales.  Genitourinary:    Cervix normal.     Vaginal  discharge present.      Assessment & Plan:   See encounters tab for problem based medical decision making.    Patient discussed with Dr. Eppie Gibson

## 2018-03-27 NOTE — Patient Instructions (Signed)
[name],  Is a pleasure seeing you today. I will call you with the results of your exam.  Please follow-up in 6 months or sooner as needed.

## 2018-03-28 LAB — CERVICOVAGINAL ANCILLARY ONLY
Bacterial vaginitis: POSITIVE — AB
Candida vaginitis: NEGATIVE
Chlamydia: NEGATIVE
NEISSERIA GONORRHEA: NEGATIVE
TRICH (WINDOWPATH): NEGATIVE

## 2018-03-28 LAB — MICROSCOPIC EXAMINATION
BACTERIA UA: NONE SEEN
CASTS: NONE SEEN /LPF

## 2018-03-28 LAB — URINALYSIS, ROUTINE W REFLEX MICROSCOPIC
Bilirubin, UA: NEGATIVE
Glucose, UA: NEGATIVE
KETONES UA: NEGATIVE
Leukocytes, UA: NEGATIVE
NITRITE UA: NEGATIVE
PH UA: 5 (ref 5.0–7.5)
Protein, UA: NEGATIVE
SPEC GRAV UA: 1.022 (ref 1.005–1.030)
Urobilinogen, Ur: 0.2 mg/dL (ref 0.2–1.0)

## 2018-03-28 LAB — IRON AND TIBC
Iron Saturation: 6 % — CL (ref 15–55)
Iron: 21 ug/dL — ABNORMAL LOW (ref 27–159)
TIBC: 352 ug/dL (ref 250–450)
UIBC: 331 ug/dL (ref 131–425)

## 2018-03-28 LAB — HIV ANTIBODY (ROUTINE TESTING W REFLEX): HIV SCREEN 4TH GENERATION: NONREACTIVE

## 2018-03-28 LAB — FERRITIN: Ferritin: 12 ng/mL — ABNORMAL LOW (ref 15–150)

## 2018-03-29 DIAGNOSIS — D509 Iron deficiency anemia, unspecified: Secondary | ICD-10-CM | POA: Insufficient documentation

## 2018-03-29 DIAGNOSIS — N898 Other specified noninflammatory disorders of vagina: Secondary | ICD-10-CM | POA: Insufficient documentation

## 2018-03-29 DIAGNOSIS — G2581 Restless legs syndrome: Secondary | ICD-10-CM | POA: Insufficient documentation

## 2018-03-29 HISTORY — DX: Iron deficiency anemia, unspecified: D50.9

## 2018-03-29 MED ORDER — FERROUS SULFATE 325 (65 FE) MG PO TABS: 325.0000 mg | ORAL_TABLET | Freq: Every day | ORAL | 3 refills | Status: DC

## 2018-03-29 MED ORDER — METRONIDAZOLE 500 MG PO TABS: 500.0000 mg | ORAL_TABLET | Freq: Two times a day (BID) | ORAL | 0 refills | Status: AC

## 2018-03-29 NOTE — Assessment & Plan Note (Signed)
Preventative care Patient is due for mammogram. Patient has asked for referral today.  She declined flu vaccine.  Plan - referral for mammogram

## 2018-03-29 NOTE — Assessment & Plan Note (Signed)
Assessment: Vaginal discharge Due to symptoms of increased frequency obtained dipstick in office. There were negative nitrites and leukocytes but moderate blood was seen. A UA with microscopy was obtained.  Dipstick is reassuring patient does not have a UTI.  A pelvic exam was done in office. And sent for trichomonas, GC chlamydia, bacterial vaginosis and candidiasis.  Plan -UA with microscopy -Pelvic exam assessing for trichomonas, GC chlamydia, bacterial vaginosis and candidiasis  Addendum:  No RBCs noted on microscopy.  Bacterial Vaginosis noted on wet prep.  Results were discussed with patient over the phone and metronidazole was sent to the pharmacy.

## 2018-03-29 NOTE — Assessment & Plan Note (Signed)
Assessment: History of iron deficiency anemia Obtaining ferritin and iron levels today. Patient is due for colonoscopy. Patient would like a referral  Plan -Diagnostic colonoscopy

## 2018-03-29 NOTE — Assessment & Plan Note (Signed)
Assessment: Restless legs Patient has a history of iron deficiency anemia not on supplements at this time. Will obtain iron studies and ferritin.  If low this is likely contributing to her symptoms.  Plan -Iron/IBC -Ferritin  Addendum:  Ferritin low at 12.  Results discussed with patient over the phone.  Iron supplement was sent into the pharmacy.

## 2018-03-29 NOTE — Assessment & Plan Note (Deleted)
Assessment: History of iron deficiency anemia Obtaining ferritin and iron levels today. Patient is due for colonoscopy. Patient would like a referral  Plan -Diagnostic colonoscopy

## 2018-03-30 NOTE — Progress Notes (Signed)
Case discussed with Dr. Ratliff-Hoffman at the time of the visit. We reviewed the resident's history and exam and pertinent patient test results. I agree with the assessment, diagnosis, and plan of care documented in the resident's note. 

## 2018-04-13 ENCOUNTER — Encounter: Payer: Self-pay | Admitting: *Deleted

## 2018-05-01 ENCOUNTER — Encounter: Payer: Self-pay | Admitting: Gastroenterology

## 2018-05-22 ENCOUNTER — Encounter (INDEPENDENT_AMBULATORY_CARE_PROVIDER_SITE_OTHER): Payer: Self-pay

## 2018-05-22 ENCOUNTER — Other Ambulatory Visit (INDEPENDENT_AMBULATORY_CARE_PROVIDER_SITE_OTHER): Payer: PPO

## 2018-05-22 ENCOUNTER — Ambulatory Visit: Payer: PPO | Admitting: Gastroenterology

## 2018-05-22 ENCOUNTER — Ambulatory Visit (HOSPITAL_COMMUNITY)
Admission: EM | Admit: 2018-05-22 | Discharge: 2018-05-22 | Disposition: A | Discharge status: Home / Self Care | Payer: PPO | Attending: Internal Medicine | Admitting: Internal Medicine

## 2018-05-22 ENCOUNTER — Encounter (HOSPITAL_COMMUNITY): Payer: Self-pay | Admitting: Emergency Medicine

## 2018-05-22 ENCOUNTER — Encounter: Payer: Self-pay | Admitting: Gastroenterology

## 2018-05-22 VITALS — BP 140/84 | HR 80 | Ht 61.42 in | Wt 233.4 lb

## 2018-05-22 DIAGNOSIS — D509 Iron deficiency anemia, unspecified: Secondary | ICD-10-CM | POA: Diagnosis not present

## 2018-05-22 DIAGNOSIS — H6123 Impacted cerumen, bilateral: Secondary | ICD-10-CM | POA: Insufficient documentation

## 2018-05-22 DIAGNOSIS — R0982 Postnasal drip: Secondary | ICD-10-CM | POA: Diagnosis not present

## 2018-05-22 DIAGNOSIS — Z1211 Encounter for screening for malignant neoplasm of colon: Secondary | ICD-10-CM | POA: Diagnosis not present

## 2018-05-22 DIAGNOSIS — Z8371 Family history of colonic polyps: Secondary | ICD-10-CM | POA: Diagnosis not present

## 2018-05-22 DIAGNOSIS — Z83719 Family history of colon polyps, unspecified: Secondary | ICD-10-CM

## 2018-05-22 LAB — CBC WITH DIFFERENTIAL/PLATELET
Basophils Absolute: 0 10*3/uL (ref 0.0–0.1)
Basophils Relative: 0.4 % (ref 0.0–3.0)
EOS PCT: 1.8 % (ref 0.0–5.0)
Eosinophils Absolute: 0.1 10*3/uL (ref 0.0–0.7)
HCT: 36.2 % (ref 36.0–46.0)
Hemoglobin: 11.5 g/dL — ABNORMAL LOW (ref 12.0–15.0)
Lymphocytes Relative: 37.1 % (ref 12.0–46.0)
Lymphs Abs: 3.1 10*3/uL (ref 0.7–4.0)
MCHC: 31.9 g/dL (ref 30.0–36.0)
MCV: 80.7 fl (ref 78.0–100.0)
MONO ABS: 0.6 10*3/uL (ref 0.1–1.0)
Monocytes Relative: 7.1 % (ref 3.0–12.0)
NEUTROS PCT: 53.6 % (ref 43.0–77.0)
Neutro Abs: 4.5 10*3/uL (ref 1.4–7.7)
Platelets: 350 10*3/uL (ref 150.0–400.0)
RBC: 4.48 Mil/uL (ref 3.87–5.11)
RDW: 17.9 % — AB (ref 11.5–15.5)
WBC: 8.5 10*3/uL (ref 4.0–10.5)

## 2018-05-22 LAB — POCT RAPID STREP A: STREPTOCOCCUS, GROUP A SCREEN (DIRECT): NEGATIVE

## 2018-05-22 MED ORDER — NA SULFATE-K SULFATE-MG SULF 17.5-3.13-1.6 GM/177ML PO SOLN: 1.0000 | ORAL | 0 refills | Status: AC

## 2018-05-22 NOTE — ED Triage Notes (Signed)
Pt c/o sore throat and bilateral ear pain.

## 2018-05-22 NOTE — ED Provider Notes (Signed)
[place]    CSN: [id number] Arrival date & time: 05/22/18  1747     History   Chief Complaint No chief complaint on file.   HPI [name] is a 53 y.o. female.   Patient is a 53 year old female that presents today with approximately 2 to 3 days of sore throat, bilateral ear pain.  Reports her symptoms have  been constant and remain the same.  She has not done anything to treat her symptoms.  She denies any associated cough, congestion, rhinorrhea, fevers, chills, myalgias.  No recent sick contacts.  No recent traveling.  ROS per HPI      Past Medical History:  Diagnosis Date  . Adnexal cyst 2011   removed  . Anemia   . Arthritis    "neck; spine" (08/29/2014)  . Asthma   . Carpal tunnel syndrome of left wrist   . Chronic lower back pain   . Complication of anesthesia    difficult to awake  . Daily headache 12/2013   "tx'd and no problems anymore" (08/29/2014)  . Dyspnea    with asthma attack- none recent  . Family history of adverse reaction to anesthesia    "1 sister and both parents hard to wake up"  . Heart murmur    "dx'd when I was a teenager; haven't had any problems w/it"  . Hip pain, right   . History of bronchitis   . Hypertension   . Urinary incontinence   . Urinary urgency   . UTI (lower urinary tract infection)     Patient Active Problem List   Diagnosis Date Noted  . Vaginal discharge 03/29/2018  . Restless leg 03/29/2018  . Iron deficiency anemia 03/29/2018  . Preventative health care 03/27/2018  . Left hand paresthesia 09/25/2017  . Muscle spasm of calf 04/26/2017  . Pharyngitis 12/31/2016  . Urinary incontinence 08/15/2016  . 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 Primary osteoarthritis of left hip 06/20/2016  . Localized osteoarthrosis of right hip 11/26/2015  . Solar lentigo 11/13/2015  . Iron deficiency anemia due to chronic blood loss 08/15/2015  . DUB (dysfunctional uterine bleeding)  08/15/2015  . Uterus, adenomyosis 07/25/2015  . 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 Essential hypertension 04/17/2015  . Health care maintenance 04/17/2015  . H/O pelvic mass 05/24/2013  . Allergic rhinitis 12/11/2010  . Asthma 09/12/2008  . Primary osteoarthritis involving multiple joints 09/12/2008    Past Surgical History:  Procedure Laterality Date  . BACK SURGERY     x2  . JOINT REPLACEMENT Right 11/2015   hip  . OVARIAN CYST REMOVAL Right 2011  . REDUCTION MAMMAPLASTY Bilateral 1995  . 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  . 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   Procedure: TOTAL HIP ARTHROPLASTY ANTERIOR APPROACH;  Surgeon: Frederik Pear, MD;  Location: [place];  Service: Orthopedics;  Laterality: Right;  . TOTAL HIP ARTHROPLASTY Left 07/19/2016   Procedure: TOTAL HIP ARTHROPLASTY ANTERIOR APPROACH;  Surgeon: Frederik Pear, MD;  Location: [place];  Service: Orthopedics;  Laterality: Left;  . TRANSFORAMINAL LUMBAR INTERBODY FUSION (TLIF) WITH PEDICLE SCREW FIXATION 2 LEVEL N/A 08/29/2014   Procedure: TRANSFORAMINAL LUMBAR INTERBODY FUSION (TLIF) L4-S1;  Surgeon: Melina Schools, MD;  Location: [place];  Service: Orthopedics;  Laterality: N/A;  . TUBAL LIGATION  1992    OB History    Gravida  3   Para  3   Term      Preterm  AB      Living  3     SAB      TAB      Ectopic      Multiple      Live Births               Home Medications    Prior to Admission medications   Medication Sig Start Date End Date Taking? Authorizing Provider  albuterol (PROVENTIL HFA;VENTOLIN HFA) 108 (90 Base) MCG/ACT inhaler Inhale 2 puffs into the lungs every 6 (six) hours as needed for wheezing or shortness of breath. 11/13/15   Valinda Party, DO  ferrous sulfate 325 (65 FE) MG tablet Take 1 tablet (325 mg total) by mouth daily with breakfast. Patient not taking: Reported on 05/22/2018 03/29/18   Kalman Shan Ratliff, DO  ibuprofen (ADVIL,MOTRIN) 600 MG tablet Take 1  tablet (600 mg total) by mouth every 6 (six) hours as needed. 09/22/17   Hoffman, Jessica Ratliff, DO  Na Sulfate-K Sulfate-Mg Sulf 17.5-3.13-1.6 GM/177ML SOLN Take 1 kit by mouth as directed for 30 days. 05/22/18 06/21/18  Thornton Park, MD  POTASSIUM PO Take 1 tablet by mouth every morning. Over the counter product (does not know strength  11/13/15  [provider]    Family History Family History  Problem Relation Age of Onset  . Pancreatic cancer Mother   . Prostate cancer Father   . Colon cancer Neg Hx   . Liver cancer Neg Hx   . Stomach cancer Neg Hx   . Rectal cancer Neg Hx   . Esophageal cancer Neg Hx     Social History Social History   Tobacco Use  . Smoking status: Never Smoker  . Smokeless tobacco: Never Used  Substance Use Topics  . Alcohol use: No  . Drug use: No     Allergies   Other; Primatene mist [epinephrine]; Shrimp [shellfish allergy]; Aleve [naproxen sodium]; Bactrim ds [sulfamethoxazole w/trimethoprim (co-trimoxazole)]; Midol [ibuprofen]; Avocado; Cocoa butter; and Latex   Review of Systems Review of Systems   Physical Exam Triage Vital Signs ED Triage Vitals [05/22/18 1802]  Enc Vitals Group     BP (!) 157/91     Pulse Rate 89     Resp 18     Temp 98.4 F (36.9 C)     Temp src      SpO2 98 %     Weight      Height      Head Circumference      Peak Flow      Pain Score      Pain Loc      Pain Edu?      Excl. in [place]?    No data found.  Updated Vital Signs BP (!) 157/91   Pulse 89   Temp 98.4 F (36.9 C)   Resp 18   SpO2 98%   Visual Acuity Right Eye Distance:   Left Eye Distance:   Bilateral Distance:    Right Eye Near:   Left Eye Near:    Bilateral Near:     Physical Exam Vitals signs and nursing note reviewed.  Constitutional:      General: She is not in acute distress.    Appearance: Normal appearance. She is not ill-appearing, toxic-appearing or diaphoretic.  HENT:     Head: Normocephalic and atraumatic.      Right Ear: External ear normal. There is impacted cerumen.     Left Ear: External ear normal. There  is impacted cerumen.     Nose: Nose normal.     Mouth/Throat:     Comments: Postnasal drip Eyes:     Conjunctiva/sclera: Conjunctivae normal.  Neck:     Musculoskeletal: Normal range of motion.  Cardiovascular:     Rate and Rhythm: Normal rate and regular rhythm.     Pulses: Normal pulses.     Heart sounds: Normal heart sounds.  Pulmonary:     Effort: Pulmonary effort is normal.     Breath sounds: Normal breath sounds.  Musculoskeletal: Normal range of motion.  Skin:    General: Skin is warm and dry.  Neurological:     Mental Status: She is alert.  Psychiatric:        Mood and Affect: Mood normal.      UC Treatments / Results  Labs (all labs ordered are listed, but only abnormal results are displayed) Labs Reviewed  CULTURE, GROUP A STREP [facility])  POCT RAPID STREP A    EKG None  Radiology No results found.  Procedures Procedures (including critical care time)  Medications Ordered in UC Medications - No data to display  Initial Impression / Assessment and Plan / UC Course  I have reviewed the triage vital signs and the nursing notes.  Pertinent labs & imaging results that were available during my care of the patient were reviewed by me and considered in my medical decision making (see chart for details).     Able to view TMs after earwax removal and there was no signs of infection We will treat the postnasal drip with Zyrtec daily Follow up as needed for continued or worsening symptoms  Final Clinical Impressions(s) / UC Diagnoses   Final diagnoses:  Bilateral impacted cerumen  Postnasal drip     Discharge Instructions     Zyrtec for the drainage in your throat and irritated throat We flushed both of the ears today to remove the earwax There is a little asked the left in the left ear but no signs of infection You can do Debrox to soften the  earwax This should help with some of the symptoms you are experiencing Follow up as needed for continued or worsening symptoms     ED Prescriptions    None     Controlled Substance Prescriptions [place] Controlled Substance Registry consulted? Not Applicable   Orvan July, NP 05/22/18 1905

## 2018-05-22 NOTE — Patient Instructions (Signed)
Your provider has requested that you go to the basement level for lab work before leaving today. Press "B" on the elevator. The lab is located at the first door on the left as you exit the elevator.  You have been scheduled for an endoscopy and colonoscopy. Please follow the written instructions given to you at your visit today. Please pick up your prep supplies at the pharmacy within the next 1-3 days. If you use inhalers (even only as needed), please bring them with you on the day of your procedure. Your physician has requested that you go to www.startemmi.com and enter the access code given to you at your visit today. This web site gives a general overview about your procedure. However, you should still follow specific instructions given to you by our office regarding your preparation for the procedure.  

## 2018-05-22 NOTE — Progress Notes (Signed)
Referring Provider: Lucious Groves, DO Primary Care Physician:  Valinda Party, DO   Reason for Consultation:  Iron deficiency   IMPRESSION:  Unexplained iron deficiency without overt or occult GI bleeding or localizing symptoms BMI of 43.5 No prior colon cancer screening Family history of colon polyps (father, age and pathology unknown) No known family history of colon cancer  Upper endoscopy and colonoscopy recommended to evaluate for unexplained iron deficiency.  There is no overt or occult GI blood loss.  No localizing symptoms.  PLAN: CBC EGD and colonoscopy  I consented the patient at the bedside today discussing the risks, benefits, and alternatives to endoscopic evaluation. In particular, we discussed the risks that include, but are not limited to, reaction to medication, cardiopulmonary compromise, bleeding requiring blood transfusion, aspiration resulting in pneumonia, perforation requiring surgery, lack of diagnosis, severe illness requiring hospitalization, and even death. We reviewed the risk of missed lesion including polyps or even cancer. The patient acknowledges these risks and asks that we proceed.   HPI: Brandi Chase is a 53 y.o. seen in consultation for iron deficiency anemia.  The history is obtained through the patient and review of her electronic health record.  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  She has essential hypertension and anxiety, childhood asthma, and arthritis.  Labs from 03/27/2018 show an iron of 21, ferritin 12, iron saturation 6.  The last CBC available to me is from 07/21/2016 showing a hemoglobin of 10.5, MCV 83.6, RDW 15.8.  No symptoms that she associates with iron deficiency anemia.  No fatigue, weakness, headache, irritability, exercise intolerance, exertional dyspnea, vertigo, or angina pectoris.  No pica.  No beeturia.  No hearing loss.    No overt GI blood loss. No melena, hematochezia, bright red  blood per rectum. No epistaxis, vaginal bleeding, hemoptysis, or hematuria.   She has many years of chronic, constant, internal tenderness in the RUQ. She attributes the pain to prior surgeries.  She is not bothered by this pain and notes that it has not changed.  No other ongoing GI symptoms.  Father with colon polyps and prostate cancer. No other known family history of colon cancer or polyps.  Past Medical History:  Diagnosis Date  . Adnexal cyst 2011   removed  . Anemia   . Arthritis    "neck; spine" (08/29/2014)  . Asthma   . Carpal tunnel syndrome of left wrist   . Chronic lower back pain   . Complication of anesthesia    difficult to awake  . Daily headache 12/2013   "tx'd and no problems anymore" (08/29/2014)  . Dyspnea    with asthma attack- none recent  . Family history of adverse reaction to anesthesia    "1 sister and both parents hard to wake up"  . Heart murmur    "dx'd when I was a teenager; haven't had any problems w/it"  . Hip pain, right   . History of bronchitis   . Hypertension   . Urinary incontinence   . Urinary urgency   . UTI (lower urinary tract infection)     Past Surgical History:  Procedure Laterality Date  . BACK SURGERY     x2  . JOINT REPLACEMENT Right 11/2015   hip  . OVARIAN CYST REMOVAL Right 2011  . REDUCTION MAMMAPLASTY Bilateral 1995  . 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  . 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   Procedure: TOTAL HIP ARTHROPLASTY ANTERIOR APPROACH;  Surgeon: Pilar Plate  Mayer Camel, MD;  Location: [place];  Service: Orthopedics;  Laterality: Right;  . TOTAL HIP ARTHROPLASTY Left 07/19/2016   Procedure: TOTAL HIP ARTHROPLASTY ANTERIOR APPROACH;  Surgeon: Frederik Pear, MD;  Location: [place];  Service: Orthopedics;  Laterality: Left;  . TRANSFORAMINAL LUMBAR INTERBODY FUSION (TLIF) WITH PEDICLE SCREW FIXATION 2 LEVEL N/A 08/29/2014   Procedure: TRANSFORAMINAL LUMBAR INTERBODY FUSION (TLIF) L4-S1;  Surgeon: Melina Schools, MD;   Location: [place];  Service: Orthopedics;  Laterality: N/A;  . TUBAL LIGATION  1992    Current Outpatient Medications  Medication Sig Dispense Refill  . albuterol (PROVENTIL HFA;VENTOLIN HFA) 108 (90 Base) MCG/ACT inhaler Inhale 2 puffs into the lungs every 6 (six) hours as needed for wheezing or shortness of breath. 1 Inhaler 3  . ibuprofen (ADVIL,MOTRIN) 600 MG tablet Take 1 tablet (600 mg total) by mouth every 6 (six) hours as needed. 30 tablet 0  . ferrous sulfate 325 (65 FE) MG tablet Take 1 tablet (325 mg total) by mouth daily with breakfast. (Patient not taking: Reported on 05/22/2018) 30 tablet 3   No current facility-administered medications for this visit.     Allergies as of 05/22/2018 - Review Complete 05/22/2018  Allergen Reaction Noted  . Other Anaphylaxis 10/01/2012  . Primatene mist [epinephrine] Other (See Comments) 09/15/2011  . Shrimp [shellfish allergy] Anaphylaxis and Other (See Comments) 07/11/2015  . Aleve [naproxen sodium] Other (See Comments) 10/01/2012  . Bactrim ds [sulfamethoxazole w/trimethoprim (co-trimoxazole)] Other (See Comments) 12/11/2010  . Midol [ibuprofen] Anxiety 11/04/2014  . Avocado Rash 10/01/2012  . Cocoa butter Itching 10/01/2012  . Latex Itching 10/01/2012    Family History  Problem Relation Age of Onset  . Pancreatic cancer Mother   . Prostate cancer Father   . Colon cancer Neg Hx   . Liver cancer Neg Hx   . Stomach cancer Neg Hx   . Rectal cancer Neg Hx   . Esophageal cancer Neg Hx     Social History   Socioeconomic History  . Marital status: Single    Spouse name: Not on file  . Number of children: 3  . Years of education: Not on file  . Highest education level: Not on file  Occupational History  . Occupation: homemaker  Social Needs  . Financial resource strain: Not on file  . Food insecurity:    Worry: Not on file    Inability: Not on file  . Transportation needs:    Medical: Not on file    Non-medical: Not on file    Tobacco Use  . Smoking status: Never Smoker  . Smokeless tobacco: Never Used  Substance and Sexual Activity  . Alcohol use: No  . Drug use: No  . Sexual activity: Yes    Birth control/protection: Surgical  Lifestyle  . Physical activity:    Days per week: Not on file    Minutes per session: Not on file  . Stress: Not on file  Relationships  . Social connections:    Talks on phone: Not on file    Gets together: Not on file    Attends religious service: Not on file    Active member of club or organization: Not on file    Attends meetings of clubs or organizations: Not on file    Relationship status: Not on file  . Intimate partner violence:    Fear of current or ex partner: Not on file    Emotionally abused: Not on file    Physically abused: Not  on file    Forced sexual activity: Not on file  Other Topics Concern  . Not on file  Social History Narrative  . Not on file    Review of Systems: 12 system ROS is negative except as noted above with the additions of arthritis, back pain, vision changes, insomnia, urination frequency.  Filed Weights   05/22/18 1505  Weight: 233 lb 6 oz (105.9 kg)    Physical Exam: Vital signs were reviewed. General:   Alert, well-nourished, pleasant and cooperative in NAD.  No pallor. Head:  Normocephalic and atraumatic.  No alopecia. Eyes:  Sclera clear, no icterus.   Conjunctiva pink. Mouth:  No deformity or lesions.  No atrophic glossitis. No chelitis. Neck:  Supple; no thyromegaly. Lungs:  Clear throughout to auscultation.   No wheezes.  Heart:  Regular rate and rhythm; no murmurs Abdomen:  Soft, nontender, central obesity,  normal bowel sounds. No rebound or guarding. No hepatosplenomegaly Rectal:  Deferred  Msk:  Symmetrical without gross deformities. Extremities:  No gross deformities or edema. Neurologic:  Alert and  oriented x4;  grossly nonfocal Skin:  No rash or bruise. Psych:  Alert and cooperative. Normal mood and  affect.   Cecilie Heidel L. Tarri Glenn, MD, MPH [facility] 05/22/2018, 3:31 PM

## 2018-05-22 NOTE — Addendum Note (Signed)
Addended by: Wyline Beady on: 05/22/2018 04:22 PM   Modules accepted: Orders

## 2018-05-22 NOTE — Discharge Instructions (Addendum)
Zyrtec for the drainage in your throat and irritated throat We flushed both of the ears today to remove the earwax There is a little asked the left in the left ear but no signs of infection You can do Debrox to soften the earwax This should help with some of the symptoms you are experiencing Follow up as needed for continued or worsening symptoms

## 2018-05-25 LAB — CULTURE, GROUP A STREP (THRC)

## 2018-06-06 ENCOUNTER — Encounter (HOSPITAL_COMMUNITY): Payer: Self-pay | Admitting: Emergency Medicine

## 2018-06-06 ENCOUNTER — Emergency Department (HOSPITAL_COMMUNITY): Payer: PPO

## 2018-06-06 ENCOUNTER — Emergency Department (HOSPITAL_COMMUNITY)
Admission: EM | Admit: 2018-06-06 | Discharge: 2018-06-07 | Disposition: A | Discharge status: Home / Self Care | Payer: PPO | Attending: Emergency Medicine | Admitting: Emergency Medicine

## 2018-06-06 DIAGNOSIS — I10 Essential (primary) hypertension: Secondary | ICD-10-CM | POA: Insufficient documentation

## 2018-06-06 DIAGNOSIS — R0789 Other chest pain: Secondary | ICD-10-CM

## 2018-06-06 DIAGNOSIS — M79602 Pain in left arm: Secondary | ICD-10-CM | POA: Diagnosis not present

## 2018-06-06 DIAGNOSIS — Z79899 Other long term (current) drug therapy: Secondary | ICD-10-CM | POA: Insufficient documentation

## 2018-06-06 DIAGNOSIS — J45909 Unspecified asthma, uncomplicated: Secondary | ICD-10-CM | POA: Insufficient documentation

## 2018-06-06 DIAGNOSIS — R079 Chest pain, unspecified: Secondary | ICD-10-CM | POA: Diagnosis not present

## 2018-06-06 LAB — CBC
HCT: 38.8 % (ref 36.0–46.0)
Hemoglobin: 11.7 g/dL — ABNORMAL LOW (ref 12.0–15.0)
MCH: 25.3 pg — ABNORMAL LOW (ref 26.0–34.0)
MCHC: 30.2 g/dL (ref 30.0–36.0)
MCV: 83.8 fL (ref 80.0–100.0)
NRBC: 0 % (ref 0.0–0.2)
PLATELETS: 373 10*3/uL (ref 150–400)
RBC: 4.63 MIL/uL (ref 3.87–5.11)
RDW: 17.2 % — AB (ref 11.5–15.5)
WBC: 10.2 10*3/uL (ref 4.0–10.5)

## 2018-06-06 LAB — I-STAT TROPONIN, ED: TROPONIN I, POC: 0.01 ng/mL (ref 0.00–0.08)

## 2018-06-06 LAB — BASIC METABOLIC PANEL
Anion gap: 9 (ref 5–15)
BUN: 16 mg/dL (ref 6–20)
CHLORIDE: 106 mmol/L (ref 98–111)
CO2: 23 mmol/L (ref 22–32)
Calcium: 9.1 mg/dL (ref 8.9–10.3)
Creatinine, Ser: 1.07 mg/dL — ABNORMAL HIGH (ref 0.44–1.00)
GFR calc non Af Amer: 59 mL/min — ABNORMAL LOW (ref >60)
Glucose, Bld: 110 mg/dL — ABNORMAL HIGH (ref 70–99)
Potassium: 4 mmol/L (ref 3.5–5.1)
Sodium: 138 mmol/L (ref 135–145)

## 2018-06-06 LAB — I-STAT BETA HCG BLOOD, ED (MC, WL, AP ONLY)

## 2018-06-06 MED ORDER — SODIUM CHLORIDE 0.9% FLUSH: 3.0000 mL | Freq: Once | INTRAVENOUS | Status: DC

## 2018-06-06 NOTE — ED Triage Notes (Signed)
Pt c/o left sided chest pain with arm pain. She reports that her chest normally hurts when it time for her menstrual but this time her arm hurts. No shortness of breath. NAD.

## 2018-06-07 MED ORDER — KETOROLAC TROMETHAMINE 30 MG/ML IJ SOLN
60.0000 mg | Freq: Once | INTRAMUSCULAR | Status: AC
  Administered 2018-06-07: 60 mg via INTRAMUSCULAR
  Filled 2018-06-07: qty 2

## 2018-06-07 MED ORDER — LIDOCAINE VISCOUS HCL 2 % MT SOLN
15.0000 mL | Freq: Once | OROMUCOSAL | Status: AC
  Administered 2018-06-07: 15 mL via ORAL
  Filled 2018-06-07: qty 15

## 2018-06-07 MED ORDER — ALUM & MAG HYDROXIDE-SIMETH 200-200-20 MG/5ML PO SUSP
30.0000 mL | Freq: Once | ORAL | Status: AC
  Administered 2018-06-07: 30 mL via ORAL
  Filled 2018-06-07: qty 30

## 2018-06-07 NOTE — Discharge Instructions (Addendum)
Please avoid spicy, acidic (citrus fruits, tomato based sauces, salsa), greasy, fatty foods.  Please avoid caffeine and alcohol.  This may have been heartburn causing you to have pain in your chest.  You may use over-the-counter medication such as Tums, Maalox or Mylanta, Pepcid as needed for burning sensation in your chest.  The pain in your arm seem to be musculoskeletal.  You may alternate Tylenol 1000 mg every 6 hours as needed for pain and Ibuprofen 800 mg every 8 hours as needed for pain.  Please take Ibuprofen with food.

## 2018-06-07 NOTE — ED Provider Notes (Signed)
TIME SEEN: 1:37 AM  CHIEF COMPLAINT: Chest pain  HPI: Patient is a 53 year old female with history of obesity, hypertension although patient denies this who presents to the emergency department with complaints of left-sided chest pain that feels like a burning, tender sensation with left arm soreness and burning.  States symptoms started Monday the 24th at 8 PM and have been constant but wax and wane.  No aggravating relieving factors.  No shortness of breath, nausea, vomiting, dizziness, diaphoresis.  She is not a smoker.  She denies history of hypertension, diabetes, hyperlipidemia or family history of CAD.  She has no known history of CAD, MI.  No history of PE, DVT, exogenous estrogen use, recent fractures, surgery, trauma, hospitalization or prolonged travel. No lower extremity swelling or pain. No calf tenderness.  States it is not abnormal for her to have chest pain similar to this when she is having her menstrual cycle.  Last menstrual cycle was January 30.  States she normally does not have left arm soreness.  ROS: See HPI Constitutional: no fever  Eyes: no drainage  ENT: no runny nose   Cardiovascular:   chest pain  Resp: no SOB  GI: no vomiting GU: no dysuria Integumentary: no rash  Allergy: no hives  Musculoskeletal: no leg swelling  Neurological: no slurred speech ROS otherwise negative  PAST MEDICAL HISTORY/PAST SURGICAL HISTORY:  Past Medical History:  Diagnosis Date  . Adnexal cyst 2011   removed  . Anemia   . Arthritis    "neck; spine" (08/29/2014)  . Asthma   . Carpal tunnel syndrome of left wrist   . Chronic lower back pain   . Complication of anesthesia    difficult to awake  . Daily headache 12/2013   "tx'd and no problems anymore" (08/29/2014)  . Dyspnea    with asthma attack- none recent  . Family history of adverse reaction to anesthesia    "1 sister and both parents hard to wake up"  . Heart murmur    "dx'd when I was a teenager; haven't had any problems  w/it"  . Hip pain, right   . History of bronchitis   . Hypertension   . Urinary incontinence   . Urinary urgency   . UTI (lower urinary tract infection)     MEDICATIONS:  Prior to Admission medications   Medication Sig Start Date End Date Taking? Authorizing Provider  albuterol (PROVENTIL HFA;VENTOLIN HFA) 108 (90 Base) MCG/ACT inhaler Inhale 2 puffs into the lungs every 6 (six) hours as needed for wheezing or shortness of breath. 11/13/15   Valinda Party, DO  ferrous sulfate 325 (65 FE) MG tablet Take 1 tablet (325 mg total) by mouth daily with breakfast. Patient not taking: Reported on 05/22/2018 03/29/18   Kalman Shan Ratliff, DO  ibuprofen (ADVIL,MOTRIN) 600 MG tablet Take 1 tablet (600 mg total) by mouth every 6 (six) hours as needed. 09/22/17   Hoffman, Jessica Ratliff, DO  Na Sulfate-K Sulfate-Mg Sulf 17.5-3.13-1.6 GM/177ML SOLN Take 1 kit by mouth as directed for 30 days. 05/22/18 06/21/18  Thornton Park, MD  POTASSIUM PO Take 1 tablet by mouth every morning. Over the counter product (does not know strength  11/13/15  [provider]    ALLERGIES:  Allergies  Allergen Reactions  . Other Anaphylaxis    ** PECANS **  . Primatene Mist [Epinephrine] Other (See Comments)    SYNCOPE > "BLACKED OUT"  . Shrimp [Shellfish Allergy] Anaphylaxis and Other (See Comments)  CAUSED VISION LOSS  . Aleve [Naproxen Sodium] Other (See Comments)    CHEST PAIN  . Bactrim Ds [Sulfamethoxazole W/Trimethoprim (Co-Trimoxazole)] Other (See Comments)    VISUAL HALLUCINATIONS   . Midol [Ibuprofen] Anxiety    AGITATION.  Marland Kitchen Avocado Rash  . Cocoa Butter Itching  . Latex Itching    SOCIAL HISTORY:  Social History   Tobacco Use  . Smoking status: Never Smoker  . Smokeless tobacco: Never Used  Substance Use Topics  . Alcohol use: No    FAMILY HISTORY: Family History  Problem Relation Age of Onset  . Pancreatic cancer Mother   . Prostate cancer Father   . Colon  cancer Neg Hx   . Liver cancer Neg Hx   . Stomach cancer Neg Hx   . Rectal cancer Neg Hx   . Esophageal cancer Neg Hx     EXAM: BP (!) 155/100 (BP Location: Right Arm)   Pulse 83   Temp 97.6 F (36.4 C) (Oral)   Resp 19   LMP 05/11/2018 Comment: shielded patient  SpO2 100%  CONSTITUTIONAL: Alert and oriented and responds appropriately to questions. Well-appearing; well-nourished HEAD: Normocephalic EYES: Conjunctivae clear, pupils appear equal, EOMI ENT: normal nose; moist mucous membranes NECK: Supple, no meningismus, no nuchal rigidity, no LAD  CARD: RRR; S1 and S2 appreciated; no murmurs, no clicks, no rubs, no gallops CHEST:  Chest wall is tender to palpation over the left chest wall.  No crepitus, ecchymosis, erythema, warmth, rash or other lesions present.   RESP: Normal chest excursion without splinting or tachypnea; breath sounds clear and equal bilaterally; no wheezes, no rhonchi, no rales, no hypoxia or respiratory distress, speaking full sentences ABD/GI: Normal bowel sounds; non-distended; soft, non-tender, no rebound, no guarding, no peritoneal signs, no hepatosplenomegaly BACK:  The back appears normal and is non-tender to palpation, there is no CVA tenderness EXT: Normal ROM in all joints; non-tender to palpation; no edema; normal capillary refill; no cyanosis, no calf tenderness or swelling, left arm diffusely tender to palpation without redness or warmth or ecchymosis or bony deformity, compartments in the left arm are soft, 2+ left radial pulse    SKIN: Normal color for age and race; warm; no rash NEURO: Moves all extremities equally PSYCH: The patient's mood and manner are appropriate. Grooming and personal hygiene are appropriate.  MEDICAL DECISION MAKING: Patient here with atypical chest pain.  Has been constant for several days.  Troponin negative.  Chest x-ray clear.  EKG shows no ischemic abnormality compared to previous.  Doubt ACS.  She has no risk factors for  pulmonary embolus other than age.  Doubt PE or dissection.  May be GI in nature given she describes as a burning pain versus musculoskeletal given pain reproducible with palpation of the arm and chest.  Will treat with GI cocktail, Toradol and reassess.  ED PROGRESS: She reports feeling much better after GI cocktail and Toradol.  Chest pain completely resolved and arm pain almost completely gone.  I feel she is safe to be discharged home with outpatient follow-up.  Discussed return precautions.  At this time, I do not feel there is any life-threatening condition present. I have reviewed and discussed all results (EKG, imaging, lab, urine as appropriate) and exam findings with patient/family. I have reviewed nursing notes and appropriate previous records.  I feel the patient is safe to be discharged home without further emergent workup and can continue workup as an outpatient as needed. Discussed usual and customary return  precautions. Patient/family verbalize understanding and are comfortable with this plan.  Outpatient follow-up has been provided as needed. All questions have been answered.      EKG Interpretation  Date/Time:  Tuesday June 06 2018 22:18:10 EST Ventricular Rate:  81 PR Interval:  144 QRS Duration: 76 QT Interval:  392 QTC Calculation: 455 R Axis:   21 Text Interpretation:  Normal sinus rhythm Nonspecific T wave abnormality Abnormal ECG No significant change since last tracing Confirmed by Gaege Sangalang, Cyril Mourning [phone]) on 06/07/2018 1:26:37 AM         Deira Shimer, Delice Bison, DO 06/07/18 0321

## 2018-06-08 ENCOUNTER — Encounter: Payer: Self-pay | Admitting: Gastroenterology

## 2018-06-13 ENCOUNTER — Ambulatory Visit (INDEPENDENT_AMBULATORY_CARE_PROVIDER_SITE_OTHER): Payer: PPO | Admitting: Internal Medicine

## 2018-06-13 ENCOUNTER — Other Ambulatory Visit: Payer: Self-pay

## 2018-06-13 ENCOUNTER — Encounter: Payer: Self-pay | Admitting: Internal Medicine

## 2018-06-13 DIAGNOSIS — Z09 Encounter for follow-up examination after completed treatment for conditions other than malignant neoplasm: Secondary | ICD-10-CM

## 2018-06-13 DIAGNOSIS — R0789 Other chest pain: Secondary | ICD-10-CM

## 2018-06-13 DIAGNOSIS — Z87898 Personal history of other specified conditions: Secondary | ICD-10-CM

## 2018-06-13 NOTE — Patient Instructions (Signed)
Brandi Chase,  It was a pleasure seeing you today.  Please call or return if your symptoms return

## 2018-06-13 NOTE — Progress Notes (Signed)
   CC: emergency room follow-up for chest pain  HPI:  [name] is a 53 y.o. female with history noted below that presents to the acute care clinic for follow-up on chest pain. Please see problem based charting for status of patient's chronic medical conditions.  Past Medical History:  Diagnosis Date  . Adnexal cyst 2011   removed  . Anemia   . Arthritis    "neck; spine" (08/29/2014)  . Asthma   . Carpal tunnel syndrome of left wrist   . Chronic lower back pain   . Complication of anesthesia    difficult to awake  . Daily headache 12/2013   "tx'd and no problems anymore" (08/29/2014)  . Dyspnea    with asthma attack- none recent  . Family history of adverse reaction to anesthesia    "1 sister and both parents hard to wake up"  . Heart murmur    "dx'd when I was a teenager; haven't had any problems w/it"  . Hip pain, right   . History of bronchitis   . Hypertension   . Urinary incontinence   . Urinary urgency   . UTI (lower urinary tract infection)     Review of Systems: Review of Systems  Constitutional: Negative for chills and fever.  Respiratory: Negative for shortness of breath.   Cardiovascular: Negative for chest pain.  Gastrointestinal: Negative for nausea and vomiting.     Physical Exam:  Vitals:   06/13/18 1547  BP: 137/73  Pulse: 99  Temp: 98.2 F (36.8 C)  TempSrc: Oral  SpO2: 99%  Weight: 229 lb 9.6 oz (104.1 kg)  Height: 5\' 1"  (1.549 m)   Physical Exam  Constitutional: She is well-developed, well-nourished, and in no distress.  Cardiovascular: Normal rate, regular rhythm and normal heart sounds. Exam reveals no gallop and no friction rub.  No murmur heard. Pulmonary/Chest: Effort normal and breath sounds normal. No respiratory distress. She has no wheezes. She has no rales.  Skin: Skin is warm and dry.     Assessment & Plan:   See encounters tab for problem based medical decision making.   Patient discussed with Dr.  Dareen Piano

## 2018-06-14 DIAGNOSIS — R0789 Other chest pain: Secondary | ICD-10-CM | POA: Insufficient documentation

## 2018-06-14 NOTE — Assessment & Plan Note (Addendum)
History of present illness: Patient was recently seen in the ED on 06/06/18 for chest pain that she described as burning and traveled down her left arm. She had associated symptoms of pins and needles. She states the episode lasted 3 hours and resolved with a GI cocktail and Toradol given in the ED. She states she has not experienced any more chest or arm pain since that event.  Assessment: Atypical chest pain It does not sound cardiac in nature. She had a EKG without ischemic changes and a troponin that was negative in the ED.  At this time since it has not recurred will continue to monitor.  Plan -Monitor

## 2018-06-15 NOTE — Progress Notes (Signed)
Internal Medicine Clinic Attending  Case discussed with Dr. Hoffman at the time of the visit.  We reviewed the resident's history and exam and pertinent patient test results.  I agree with the assessment, diagnosis, and plan of care documented in the resident's note.  

## 2018-06-17 ENCOUNTER — Emergency Department (HOSPITAL_COMMUNITY): Payer: PPO

## 2018-06-17 ENCOUNTER — Other Ambulatory Visit: Payer: Self-pay

## 2018-06-17 ENCOUNTER — Emergency Department (HOSPITAL_COMMUNITY)
Admission: EM | Admit: 2018-06-17 | Discharge: 2018-06-18 | Disposition: A | Discharge status: Home / Self Care | Payer: PPO | Attending: Emergency Medicine | Admitting: Emergency Medicine

## 2018-06-17 ENCOUNTER — Encounter (HOSPITAL_COMMUNITY): Payer: Self-pay | Admitting: Emergency Medicine

## 2018-06-17 DIAGNOSIS — J111 Influenza due to unidentified influenza virus with other respiratory manifestations: Secondary | ICD-10-CM | POA: Insufficient documentation

## 2018-06-17 DIAGNOSIS — Z91040 Latex allergy status: Secondary | ICD-10-CM | POA: Diagnosis not present

## 2018-06-17 DIAGNOSIS — R69 Illness, unspecified: Secondary | ICD-10-CM

## 2018-06-17 DIAGNOSIS — J45909 Unspecified asthma, uncomplicated: Secondary | ICD-10-CM | POA: Diagnosis not present

## 2018-06-17 DIAGNOSIS — I10 Essential (primary) hypertension: Secondary | ICD-10-CM | POA: Diagnosis not present

## 2018-06-17 DIAGNOSIS — R079 Chest pain, unspecified: Secondary | ICD-10-CM | POA: Diagnosis not present

## 2018-06-17 DIAGNOSIS — Z79899 Other long term (current) drug therapy: Secondary | ICD-10-CM | POA: Diagnosis not present

## 2018-06-17 DIAGNOSIS — R9431 Abnormal electrocardiogram [ECG] [EKG]: Secondary | ICD-10-CM | POA: Diagnosis not present

## 2018-06-17 DIAGNOSIS — R0789 Other chest pain: Secondary | ICD-10-CM | POA: Insufficient documentation

## 2018-06-17 DIAGNOSIS — R05 Cough: Secondary | ICD-10-CM | POA: Diagnosis not present

## 2018-06-17 LAB — CBC
HCT: 37.6 % (ref 36.0–46.0)
Hemoglobin: 11.5 g/dL — ABNORMAL LOW (ref 12.0–15.0)
MCH: 25 pg — ABNORMAL LOW (ref 26.0–34.0)
MCHC: 30.6 g/dL (ref 30.0–36.0)
MCV: 81.7 fL (ref 80.0–100.0)
Platelets: 241 10*3/uL (ref 150–400)
RBC: 4.6 MIL/uL (ref 3.87–5.11)
RDW: 17.8 % — ABNORMAL HIGH (ref 11.5–15.5)
WBC: 5.8 10*3/uL (ref 4.0–10.5)
nRBC: 0 % (ref 0.0–0.2)

## 2018-06-17 LAB — BASIC METABOLIC PANEL
Anion gap: 14 (ref 5–15)
BUN: 10 mg/dL (ref 6–20)
CO2: 18 mmol/L — ABNORMAL LOW (ref 22–32)
Calcium: 8.9 mg/dL (ref 8.9–10.3)
Chloride: 102 mmol/L (ref 98–111)
Creatinine, Ser: 1.08 mg/dL — ABNORMAL HIGH (ref 0.44–1.00)
GFR calc Af Amer: >60 mL/min (ref >60)
GFR, EST NON AFRICAN AMERICAN: 59 mL/min — AB (ref >60)
Glucose, Bld: 109 mg/dL — ABNORMAL HIGH (ref 70–99)
Potassium: 3.7 mmol/L (ref 3.5–5.1)
Sodium: 134 mmol/L — ABNORMAL LOW (ref 135–145)

## 2018-06-17 LAB — I-STAT TROPONIN, ED: TROPONIN I, POC: 0 ng/mL (ref 0.00–0.08)

## 2018-06-17 LAB — I-STAT BETA HCG BLOOD, ED (MC, WL, AP ONLY): I-stat hCG, quantitative: <5 m[IU]/mL (ref <5)

## 2018-06-17 MED ORDER — SODIUM CHLORIDE 0.9% FLUSH: 3.0000 mL | Freq: Once | INTRAVENOUS | Status: DC

## 2018-06-17 NOTE — ED Triage Notes (Signed)
Pt c/o chest pain, shortness of breath and a cough x 1 week.

## 2018-06-18 ENCOUNTER — Telehealth: Payer: Self-pay | Admitting: Internal Medicine

## 2018-06-18 MED ORDER — ALUM & MAG HYDROXIDE-SIMETH 200-200-20 MG/5ML PO SUSP
30.0000 mL | Freq: Once | ORAL | Status: AC
  Administered 2018-06-18: 30 mL via ORAL
  Filled 2018-06-18: qty 30

## 2018-06-18 MED ORDER — OMEPRAZOLE 40 MG PO CPDR: 40.0000 mg | DELAYED_RELEASE_CAPSULE | Freq: Every day | ORAL | 1 refills | Status: DC

## 2018-06-18 MED ORDER — ONDANSETRON 4 MG PO TBDP: 4.0000 mg | ORAL_TABLET | Freq: Four times a day (QID) | ORAL | 0 refills | Status: DC | PRN

## 2018-06-18 MED ORDER — OSELTAMIVIR PHOSPHATE 75 MG PO CAPS: 75.0000 mg | ORAL_CAPSULE | Freq: Two times a day (BID) | ORAL | 0 refills | Status: DC

## 2018-06-18 MED ORDER — OSELTAMIVIR PHOSPHATE 75 MG PO CAPS
75.0000 mg | ORAL_CAPSULE | Freq: Once | ORAL | Status: AC
  Administered 2018-06-18: 75 mg via ORAL
  Filled 2018-06-18: qty 1

## 2018-06-18 MED ORDER — ONDANSETRON 4 MG PO TBDP
4.0000 mg | ORAL_TABLET | Freq: Once | ORAL | Status: AC
  Administered 2018-06-18: 4 mg via ORAL
  Filled 2018-06-18: qty 1

## 2018-06-18 MED ORDER — LIDOCAINE VISCOUS HCL 2 % MT SOLN
15.0000 mL | Freq: Once | OROMUCOSAL | Status: AC
  Administered 2018-06-18: 15 mL via ORAL
  Filled 2018-06-18: qty 15

## 2018-06-18 MED ORDER — ACETAMINOPHEN 500 MG PO TABS
1000.0000 mg | ORAL_TABLET | Freq: Once | ORAL | Status: DC
  Filled 2018-06-18: qty 2

## 2018-06-18 NOTE — Telephone Encounter (Signed)
Pt called to report that today she was diagnosed with influenza She has colonoscopy scheduled for later this week, Thursday, with Dr. Tarri Glenn. She would like advice or what to do.  Likely will need to reschedule, but will defer this to Dr. Tarri Glenn. I made patient aware she would contacted by our office with further plans tomorrow.  She thanked me for the call.

## 2018-06-18 NOTE — Discharge Instructions (Signed)
You may alternate Tylenol 1000 mg every 6 hours as needed for fever and pain and ibuprofen 800 mg every 8 hours as needed for fever and pain. Please rest and drink plenty of fluids. This is a viral illness causing your symptoms. You do not need antibiotics for a virus. You may use over-the-counter nasal saline spray and Afrin nasal saline spray as needed for nasal congestion. Please do not use Afrin for more than 3 days in a row. You may use guaifenesin and dextromethorphan as needed for cough.  You may use lozenges and Chloraseptic spray to help with sore throat.  Warm salt water gargles can also help with sore throat.  You may use over-the-counter Unisom (doxyalamine) or Benadryl to help with sleep.  Please note that some combination medicines such as DayQuil and NyQuil have multiple medications in them.  Please make sure you look at all labels to ensure that you are not taking too much of any one particular medication.  Symptoms from a virus may take 7-14 days to run its course.  We do not test for the flu from the emergency department as it takes hours for this test to come back and it would not change our management. The flu is treated like any other virus with supportive measures as listed above.  Medication such as Tamiflu and Xofluza may be used to treat the flu.  They have been shown to shorten the course of the flu by approximately 12 hours.  Tamiflu has to be taken within the first 48 hours of symptoms.  Tamiflu has many side effects including nausea, vomiting and diarrhea.

## 2018-06-18 NOTE — ED Provider Notes (Signed)
TIME SEEN: 1:01 AM  CHIEF COMPLAINT: Fever, body aches, cough, burning chest pain  HPI: Patient is a 53 year old female with history of asthma, hypertension who presents to the emergency department with 2 days of fevers, nonproductive cough, body aches.  Also reports 2 weeks of chest burning.  Was seen in the emergency department on 06/06/2018 for chest burning.  Seem to be musculoskeletal in nature.  Troponin at that time negative.  No vomiting or diarrhea.  No shortness of breath, dizziness, diaphoresis.  No lower extremity swelling or pain.  No known sick contacts.  No recent travel.  No history of PE, DVT, exogenous estrogen use, recent fractures, surgery, trauma, hospitalization or prolonged travel. No lower extremity swelling or pain. No calf tenderness.   ROS: See HPI Constitutional:  fever  Eyes: no drainage  ENT: no runny nose   Cardiovascular:   chest pain  Resp: no SOB  GI: no vomiting GU: no dysuria Integumentary: no rash  Allergy: no hives  Musculoskeletal: no leg swelling  Neurological: no slurred speech ROS otherwise negative  PAST MEDICAL HISTORY/PAST SURGICAL HISTORY:  Past Medical History:  Diagnosis Date  . Adnexal cyst 2011   removed  . Anemia   . Arthritis    "neck; spine" (08/29/2014)  . Asthma   . Carpal tunnel syndrome of left wrist   . Chronic lower back pain   . Complication of anesthesia    difficult to awake  . Daily headache 12/2013   "tx'd and no problems anymore" (08/29/2014)  . Dyspnea    with asthma attack- none recent  . Family history of adverse reaction to anesthesia    "1 sister and both parents hard to wake up"  . Heart murmur    "dx'd when I was a teenager; haven't had any problems w/it"  . Hip pain, right   . History of bronchitis   . Hypertension   . Urinary incontinence   . Urinary urgency   . UTI (lower urinary tract infection)     MEDICATIONS:  Prior to Admission medications   Medication Sig Start Date End Date Taking?  Authorizing Provider  albuterol (PROVENTIL HFA;VENTOLIN HFA) 108 (90 Base) MCG/ACT inhaler Inhale 2 puffs into the lungs every 6 (six) hours as needed for wheezing or shortness of breath. Patient not taking: Reported on 06/07/2018 11/13/15   Kalman Shan Ratliff, DO  ferrous sulfate 325 (65 FE) MG tablet Take 1 tablet (325 mg total) by mouth daily with breakfast. Patient not taking: Reported on 05/22/2018 03/29/18   Kalman Shan Ratliff, DO  ibuprofen (ADVIL,MOTRIN) 600 MG tablet Take 1 tablet (600 mg total) by mouth every 6 (six) hours as needed. 09/22/17   Hoffman, Brandi Ratliff, DO  Na Sulfate-K Sulfate-Mg Sulf 17.5-3.13-1.6 GM/177ML SOLN Take 1 kit by mouth as directed for 30 days. Patient not taking: Reported on 06/07/2018 05/22/18 06/21/18  Thornton Park, MD  POTASSIUM PO Take 1 tablet by mouth every morning. Over the counter product (does not know strength  11/13/15  [provider]    ALLERGIES:  Allergies  Allergen Reactions  . Other Anaphylaxis    ** PECANS **  . Primatene Mist [Epinephrine] Other (See Comments)    SYNCOPE > "BLACKED OUT"  . Shrimp [Shellfish Allergy] Anaphylaxis and Other (See Comments)    CAUSED VISION LOSS  . Aleve [Naproxen Sodium] Other (See Comments)    CHEST PAIN  . Bactrim Ds [Sulfamethoxazole W/Trimethoprim (Co-Trimoxazole)] Other (See Comments)    VISUAL HALLUCINATIONS   .  Midol [Ibuprofen] Anxiety    AGITATION.  Marland Kitchen Avocado Rash  . Cocoa Butter Itching  . Latex Itching    SOCIAL HISTORY:  Social History   Tobacco Use  . Smoking status: Never Smoker  . Smokeless tobacco: Never Used  Substance Use Topics  . Alcohol use: No    FAMILY HISTORY: Family History  Problem Relation Age of Onset  . Pancreatic cancer Mother   . Prostate cancer Father   . Colon cancer Neg Hx   . Liver cancer Neg Hx   . Stomach cancer Neg Hx   . Rectal cancer Neg Hx   . Esophageal cancer Neg Hx     EXAM: BP 106/63 (BP Location: Right Arm)    Pulse 89   Temp 100 F (37.8 C) (Oral)   Resp 19   SpO2 98%  CONSTITUTIONAL: Alert and oriented and responds appropriately to questions. Well-appearing; well-nourished HEAD: Normocephalic EYES: Conjunctivae clear, pupils appear equal, EOMI ENT: normal nose; moist mucous membranes; No pharyngeal erythema or petechiae, no tonsillar hypertrophy or exudate, no uvular deviation, no unilateral swelling, no trismus or drooling, no muffled voice, normal phonation, no stridor, no dental caries present, no drainable dental abscess noted, no Ludwig's angina, tongue sits flat in the bottom of the mouth, no angioedema, no facial erythema or warmth, no facial swelling; no pain with movement of the neck.   NECK: Supple, no meningismus, no nuchal rigidity, no LAD  CARD: RRR; S1 and S2 appreciated; no murmurs, no clicks, no rubs, no gallops RESP: Normal chest excursion without splinting or tachypnea; breath sounds clear and equal bilaterally; no wheezes, no rhonchi, no rales, no hypoxia or respiratory distress, speaking full sentences ABD/GI: Normal bowel sounds; non-distended; soft, non-tender, no rebound, no guarding, no peritoneal signs, no hepatosplenomegaly BACK:  The back appears normal and is non-tender to palpation, there is no CVA tenderness EXT: Normal ROM in all joints; non-tender to palpation; no edema; normal capillary refill; no cyanosis, no calf tenderness or swelling    SKIN: Normal color for age and race; warm; no rash NEURO: Moves all extremities equally PSYCH: The patient's mood and manner are appropriate. Grooming and personal hygiene are appropriate.  MEDICAL DECISION MAKING: Patient here with atypical chest pain and flulike symptoms.  Has had ongoing atypical chest pain since end of February.  Was seen in the emergency department had negative troponin and symptoms resolved with Toradol and GI cocktail.  Troponin again negative today.  She is PERC negative.  Doubt ACS or dissection.  Will  repeat GI cocktail again today and discharge him on Protonix for her burning chest pain.  She was seen by her family doctor on March 3 who agreed this was very atypical in nature.  Suspect patient also has influenza.  Has had 2 days of fevers, body aches, cough.  Chest x-ray shows no pneumonia.  She is otherwise well-appearing and does not appear septic, toxic.  Doubt meningitis, bacteremia.  Given she is within treatment window for Tamiflu, this medication has been offered.  Risk and benefits have been discussed.  She would like to start Tamiflu.  Will discharge with prescription for Tamiflu as well as Zofran.  Discussed return precautions.  I do not feel she needs antibiotics at this time.  No sign of bacterial infection present today.  At this time, I do not feel there is any life-threatening condition present. I have reviewed and discussed all results (EKG, imaging, lab, urine as appropriate) and exam findings with patient/family.  I have reviewed nursing notes and appropriate previous records.  I feel the patient is safe to be discharged home without further emergent workup and can continue workup as an outpatient as needed. Discussed usual and customary return precautions. Patient/family verbalize understanding and are comfortable with this plan.  Outpatient follow-up has been provided as needed. All questions have been answered.      EKG Interpretation  Date/Time:  Saturday June 17 2018 20:10:14 EST Ventricular Rate:  99 PR Interval:  136 QRS Duration: 84 QT Interval:  322 QTC Calculation: 413 R Axis:   26 Text Interpretation:  Normal sinus rhythm Nonspecific ST and T wave abnormality Abnormal ECG No significant change since last tracing Reconfirmed by Ward, Cyril Mourning [phone]) on 06/18/2018 1:01:52 AM         Ward, Delice Bison, DO 06/18/18 0129

## 2018-06-19 ENCOUNTER — Telehealth: Payer: Self-pay | Admitting: Internal Medicine

## 2018-06-19 NOTE — Telephone Encounter (Signed)
Pt missed call, pls return call [phone]

## 2018-06-19 NOTE — Telephone Encounter (Signed)
She should defer colonoscopy until she is over her acute illness. Thanks.

## 2018-06-20 NOTE — Telephone Encounter (Signed)
Left detailed message on patients machine to call back to reschedule her procedure but I will cancel it.

## 2018-06-22 ENCOUNTER — Encounter: Payer: PPO | Admitting: Gastroenterology

## 2018-09-07 ENCOUNTER — Ambulatory Visit: Payer: PPO

## 2018-09-13 ENCOUNTER — Other Ambulatory Visit: Payer: Self-pay

## 2018-09-13 ENCOUNTER — Ambulatory Visit (INDEPENDENT_AMBULATORY_CARE_PROVIDER_SITE_OTHER): Payer: PPO | Admitting: Internal Medicine

## 2018-09-13 DIAGNOSIS — K644 Residual hemorrhoidal skin tags: Secondary | ICD-10-CM | POA: Diagnosis not present

## 2018-09-13 NOTE — Progress Notes (Signed)
   CC: hemorrhoid check   HPI:  [name] is a 52 y.o. female with PMHx as listed below who presents for evaluation of hemorrhoids.   Please see encounter tab for full details of HPI  Past Medical History:  Diagnosis Date  . Adnexal cyst 2011   removed  . Anemia   . Arthritis    "neck; spine" (08/29/2014)  . Asthma   . Carpal tunnel syndrome of left wrist   . Chronic lower back pain   . Complication of anesthesia    difficult to awake  . Daily headache 12/2013   "tx'd and no problems anymore" (08/29/2014)  . Dyspnea    with asthma attack- none recent  . Family history of adverse reaction to anesthesia    "1 sister and both parents hard to wake up"  . Heart murmur    "dx'd when I was a teenager; haven't had any problems w/it"  . Hip pain, right   . History of bronchitis   . Hypertension   . Urinary incontinence   . Urinary urgency   . UTI (lower urinary tract infection)    Review of Systems:  Denies melena, hematochezia, fevers, chills   Physical Exam:  Vitals:   09/13/18 1338  BP: (!) 153/85  Pulse: 95  Temp: 98.4 F (36.9 C)  TempSrc: Oral  SpO2: 100%  Weight: 229 lb 4.8 oz (104 kg)  Height: 5\' 1"  (1.549 m)   Gen: well appearing, NAD Rectal: external hemorrhoid present without bleeding or significant tenderness. No blood or abnormalities palpated on digital insertion.   Assessment & Plan:   See Encounters Tab for problem based charting.  Patient discussed with Dr. Lynnae January

## 2018-09-13 NOTE — Patient Instructions (Addendum)
It was nice seeing you today. Thank you for choosing [facility] for your Primary Care.   Today we talked about:  1) Hemorrhoids:   - Purchase over the counter hemorrhoid suppository such as Preparation H, use this up to twice daily.   - Avoid constipation  - If your symptoms are not better after one week, give [place] a call. We can refer you to GI for "banding" of the hemorrhoid in  order to remove it   - You are also due for a colonoscopy, I will refer you to GI for this  FOLLOW-UP INSTRUCTIONS When: next available appointment with PCP to f/u HTN For: HTN What to bring: all medications   Please contact the clinic if you have any problems, or need to be seen sooner.    Hemorrhoids Hemorrhoids are swollen veins in and around the rectum or anus. There are two types of hemorrhoids:  Internal hemorrhoids. These occur in the veins that are just inside the rectum. They may poke through to the outside and become irritated and painful.  External hemorrhoids. These occur in the veins that are outside the anus and can be felt as a painful swelling or hard lump near the anus. Most hemorrhoids do not cause serious problems, and they can be managed with home treatments such as diet and lifestyle changes. If home treatments do not help the symptoms, procedures can be done to shrink or remove the hemorrhoids. What are the causes? This condition is caused by increased pressure in the anal area. This pressure may result from various things, including:  Constipation.  Straining to have a bowel movement.  Diarrhea.  Pregnancy.  Obesity.  Sitting for long periods of time.  Heavy lifting or other activity that causes you to strain.  Anal sex.  Riding a bike for a long period of time. What are the signs or symptoms? Symptoms of this condition include:  Pain.  Anal itching or irritation.  Rectal bleeding.  Leakage of stool (feces).  Anal swelling.  One or more lumps around  the anus. How is this diagnosed? This condition can often be diagnosed through a visual exam. Other exams or tests may also be done, such as:  An exam that involves feeling the rectal area with a gloved hand (digital rectal exam).  An exam of the anal canal that is done using a small tube (anoscope).  A blood test, if you have lost a significant amount of blood.  A test to look inside the colon using a flexible tube with a camera on the end (sigmoidoscopy or colonoscopy). How is this treated? This condition can usually be treated at home. However, various procedures may be done if dietary changes, lifestyle changes, and other home treatments do not help your symptoms. These procedures can help make the hemorrhoids smaller or remove them completely. Some of these procedures involve surgery, and others do not. Common procedures include:  Rubber band ligation. Rubber bands are placed at the base of the hemorrhoids to cut off their blood supply.  Sclerotherapy. Medicine is injected into the hemorrhoids to shrink them.  Infrared coagulation. A type of light energy is used to get rid of the hemorrhoids.  Hemorrhoidectomy surgery. The hemorrhoids are surgically removed, and the veins that supply them are tied off.  Stapled hemorrhoidopexy surgery. The surgeon staples the base of the hemorrhoid to the rectal wall. Follow these instructions at home: Eating and drinking   Eat foods that have a lot of fiber  in them, such as whole grains, beans, nuts, fruits, and vegetables.  Ask your health care provider about taking products that have added fiber (fiber supplements).  Reduce the amount of fat in your diet. You can do this by eating low-fat dairy products, eating less red meat, and avoiding processed foods.  Drink enough fluid to keep your urine pale yellow. Managing pain and swelling   Take warm sitz baths for 20 minutes, 3-4 times a day to ease pain and discomfort. You may do this in a  bathtub or using a portable sitz bath that fits over the toilet.  If directed, apply ice to the affected area. Using ice packs between sitz baths may be helpful. ? Put ice in a plastic bag. ? Place a towel between your skin and the bag. ? Leave the ice on for 20 minutes, 2-3 times a day. General instructions  Take over-the-counter and prescription medicines only as told by your health care provider.  Use medicated creams or suppositories as told.  Get regular exercise. Ask your health care provider how much and what kind of exercise is best for you. In general, you should do moderate exercise for at least 30 minutes on most days of the week (150 minutes each week). This can include activities such as walking, biking, or yoga.  Go to the bathroom when you have the urge to have a bowel movement. Do not wait.  Avoid straining to have bowel movements.  Keep the anal area dry and clean. Use wet toilet paper or moist towelettes after a bowel movement.  Do not sit on the toilet for long periods of time. This increases blood pooling and pain.  Keep all follow-up visits as told by your health care provider. This is important. Contact a health care provider if you have:  Increasing pain and swelling that are not controlled by treatment or medicine.  Difficulty having a bowel movement, or you are unable to have a bowel movement.  Pain or inflammation outside the area of the hemorrhoids. Get help right away if you have:  Uncontrolled bleeding from your rectum. Summary  Hemorrhoids are swollen veins in and around the rectum or anus.  Most hemorrhoids can be managed with home treatments such as diet and lifestyle changes.  Taking warm sitz baths can help ease pain and discomfort.  In severe cases, procedures or surgery can be done to shrink or remove the hemorrhoids. This information is not intended to replace advice given to you by your health care provider. Make sure you discuss any  questions you have with your health care provider. Document Released: 03/26/2000 Document Revised: 08/18/2017 Document Reviewed: 08/18/2017 Elsevier Interactive Patient Education  2019 [employer].

## 2018-09-13 NOTE — Progress Notes (Signed)
Internal Medicine Clinic Attending  Case discussed with Dr. Vogel  at the time of the visit.  We reviewed the resident's history and exam and pertinent patient test results.  I agree with the assessment, diagnosis, and plan of care documented in the resident's note.  

## 2018-09-13 NOTE — Assessment & Plan Note (Signed)
Endorses 12 days of rectal discomfort. Had similar feeling years ago after she gave birth to her son. Denies melena, hematochezia, constipation decreased appetite, weight loss, abdominal pain, n/v. She has one easy BM daily. She has not tried anything to make it better. Denies painful BMs. She has not had a colonoscopy and is working on getting it scheduled.   Exam consistent with external hemorrhoids without complications.   - over the counter suppository such as preparation H used BID - sitz baths  - avoid constipation  - if symptoms not improved in one week, advised to call us and we can refer to GI for banding

## 2018-10-09 ENCOUNTER — Encounter: Payer: Self-pay | Admitting: *Deleted

## 2018-10-25 ENCOUNTER — Telehealth: Payer: Self-pay

## 2018-10-25 NOTE — Telephone Encounter (Signed)
Please call pt about chicken pox test.

## 2018-10-25 NOTE — Telephone Encounter (Signed)
Returned call to patient. States she has several siblings who had chickenpox and are now receiving Shingrix vaccination. She never had rash and wonders if she should have a test to determine if she has immunity. Hubbard Hartshorn, RN, BSN

## 2018-10-26 NOTE — Telephone Encounter (Signed)
If she desires it, this pt is eligible to get the Shingrix vaccine solely based on her age. No antibody tests are neccessary prior to giving the vaccine. Thanks!

## 2018-10-26 NOTE — Telephone Encounter (Signed)
Notified patient of below. She would still like to know if she has immunity. Appt scheduled for 11/16/2018 at 2:45 to meet new PCP and discuss testing for varicella immunity. Hubbard Hartshorn, RN, BSN

## 2018-11-16 ENCOUNTER — Encounter: Payer: PPO | Admitting: Internal Medicine

## 2018-12-01 ENCOUNTER — Ambulatory Visit: Payer: PPO | Admitting: Internal Medicine

## 2018-12-10 ENCOUNTER — Emergency Department (HOSPITAL_COMMUNITY)
Admission: EM | Admit: 2018-12-10 | Discharge: 2018-12-11 | Disposition: A | Discharge status: Home / Self Care | Payer: PPO | Attending: Emergency Medicine | Admitting: Emergency Medicine

## 2018-12-10 ENCOUNTER — Other Ambulatory Visit: Payer: Self-pay

## 2018-12-10 ENCOUNTER — Encounter (HOSPITAL_COMMUNITY): Payer: Self-pay | Admitting: *Deleted

## 2018-12-10 DIAGNOSIS — W57XXXA Bitten or stung by nonvenomous insect and other nonvenomous arthropods, initial encounter: Secondary | ICD-10-CM | POA: Insufficient documentation

## 2018-12-10 DIAGNOSIS — I10 Essential (primary) hypertension: Secondary | ICD-10-CM | POA: Diagnosis not present

## 2018-12-10 DIAGNOSIS — Y939 Activity, unspecified: Secondary | ICD-10-CM | POA: Insufficient documentation

## 2018-12-10 DIAGNOSIS — Z79899 Other long term (current) drug therapy: Secondary | ICD-10-CM | POA: Diagnosis not present

## 2018-12-10 DIAGNOSIS — Y929 Unspecified place or not applicable: Secondary | ICD-10-CM | POA: Diagnosis not present

## 2018-12-10 DIAGNOSIS — S40861A Insect bite (nonvenomous) of right upper arm, initial encounter: Secondary | ICD-10-CM | POA: Diagnosis not present

## 2018-12-10 DIAGNOSIS — Y998 Other external cause status: Secondary | ICD-10-CM | POA: Diagnosis not present

## 2018-12-10 DIAGNOSIS — J45909 Unspecified asthma, uncomplicated: Secondary | ICD-10-CM | POA: Insufficient documentation

## 2018-12-10 NOTE — ED Triage Notes (Signed)
Pt arrives with c/o insect bite to the right posterior upper arm for 2 days, reports itching, burning and redness. No fevers.

## 2018-12-11 MED ORDER — DIPHENHYDRAMINE HCL 25 MG PO CAPS
25.0000 mg | ORAL_CAPSULE | Freq: Once | ORAL | Status: AC
  Administered 2018-12-11: 25 mg via ORAL
  Filled 2018-12-11: qty 1

## 2018-12-11 MED ORDER — HYDROCORTISONE 1 % EX CREA: 1.0000 "application " | TOPICAL_CREAM | Freq: Two times a day (BID) | CUTANEOUS | 2 refills | Status: DC

## 2018-12-11 MED ORDER — DIPHENHYDRAMINE HCL 25 MG PO TABS: 25.0000 mg | ORAL_TABLET | Freq: Four times a day (QID) | ORAL | 0 refills | Status: DC

## 2018-12-11 NOTE — ED Provider Notes (Signed)
[facility] EMERGENCY DEPARTMENT Provider Note   CSN: [id number] Arrival date & time: 12/10/18  2247     History   Chief Complaint Chief Complaint  Patient presents with  . Insect Bite    HPI Brandi Chase is a 53 y.o. female.     The history is provided by the patient and medical records.     53 year old female with history of anemia, asthma, headaches, chronic hip pain, presenting to the ED with bug bite of right posterior arm.  States this happened sometime Friday evening, she is not entirely sure what bit her.  States she has been cleaning the area with alcohol to try and dried out which is worked for her in the past but symptoms are getting worse.  She reports significant itching and burning of the area localized to the bite.  She denies any generalized rash or fever.  She has not tried any oral medications or topical ointment.  Past Medical History:  Diagnosis Date  . Adnexal cyst 2011   removed  . Anemia   . Arthritis    "neck; spine" (08/29/2014)  . Asthma   . Carpal tunnel syndrome of left wrist   . Chronic lower back pain   . Complication of anesthesia    difficult to awake  . Daily headache 12/2013   "tx'd and no problems anymore" (08/29/2014)  . Dyspnea    with asthma attack- none recent  . Family history of adverse reaction to anesthesia    "1 sister and both parents hard to wake up"  . Heart murmur    "dx'd when I was a teenager; haven't had any problems w/it"  . Hip pain, right   . History of bronchitis   . Hypertension   . Urinary incontinence   . Urinary urgency   . UTI (lower urinary tract infection)     Patient Active Problem List   Diagnosis Date Noted  . External hemorrhoid 09/13/2018  . Atypical chest pain 06/14/2018  . Iron deficiency anemia 03/29/2018  . Preventative health care 03/27/2018  . 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 DUB (dysfunctional uterine bleeding) 08/15/2015  . Uterus,  adenomyosis 07/25/2015  . 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 Essential hypertension 04/17/2015  . Health care maintenance 04/17/2015  . H/O pelvic mass 05/24/2013  . Allergic rhinitis 12/11/2010  . Asthma 09/12/2008    Past Surgical History:  Procedure Laterality Date  . BACK SURGERY     x2  . JOINT REPLACEMENT Right 11/2015   hip  . OVARIAN CYST REMOVAL Right 2011  . REDUCTION MAMMAPLASTY Bilateral 1995  . 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  . 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   Procedure: TOTAL HIP ARTHROPLASTY ANTERIOR APPROACH;  Surgeon: Frederik Pear, MD;  Location: [place];  Service: Orthopedics;  Laterality: Right;  . TOTAL HIP ARTHROPLASTY Left 07/19/2016   Procedure: TOTAL HIP ARTHROPLASTY ANTERIOR APPROACH;  Surgeon: Frederik Pear, MD;  Location: [place];  Service: Orthopedics;  Laterality: Left;  . TRANSFORAMINAL LUMBAR INTERBODY FUSION (TLIF) WITH PEDICLE SCREW FIXATION 2 LEVEL N/A 08/29/2014   Procedure: TRANSFORAMINAL LUMBAR INTERBODY FUSION (TLIF) L4-S1;  Surgeon: Melina Schools, MD;  Location: [facility];  Service: Orthopedics;  Laterality: N/A;  . TUBAL LIGATION  1992     OB History    Gravida  3   Para  3   Term      Preterm      AB  Living  3     SAB      TAB      Ectopic      Multiple      Live Births               Home Medications    Prior to Admission medications   Medication Sig Start Date End Date Taking? Authorizing Provider  albuterol (PROVENTIL HFA;VENTOLIN HFA) 108 (90 Base) MCG/ACT inhaler Inhale 2 puffs into the lungs every 6 (six) hours as needed for wheezing or shortness of breath. Patient not taking: Reported on 06/07/2018 11/13/15   Kalman Shan Ratliff, DO  ferrous sulfate 325 (65 FE) MG tablet Take 1 tablet (325 mg total) by mouth daily with breakfast. Patient not taking: Reported on 05/22/2018 03/29/18   Kalman Shan Ratliff, DO  ibuprofen (ADVIL,MOTRIN) 600 MG tablet Take 1 tablet (600 mg total) by mouth every 6  (six) hours as needed. 09/22/17   Kalman Shan Ratliff, DO  omeprazole (PRILOSEC) 40 MG capsule Take 1 capsule (40 mg total) by mouth daily. 06/18/18   Ward, Delice Bison, DO  ondansetron (ZOFRAN ODT) 4 MG disintegrating tablet Take 1 tablet (4 mg total) by mouth every 6 (six) hours as needed. 06/18/18   Ward, Delice Bison, DO  oseltamivir (TAMIFLU) 75 MG capsule Take 1 capsule (75 mg total) by mouth every 12 (twelve) hours. 06/18/18   Ward, Delice Bison, DO  POTASSIUM PO Take 1 tablet by mouth every morning. Over the counter product (does not know strength  11/13/15  [provider]    Family History Family History  Problem Relation Age of Onset  . Pancreatic cancer Mother   . Prostate cancer Father   . Colon cancer Neg Hx   . Liver cancer Neg Hx   . Stomach cancer Neg Hx   . Rectal cancer Neg Hx   . Esophageal cancer Neg Hx     Social History Social History   Tobacco Use  . Smoking status: Never Smoker  . Smokeless tobacco: Never Used  Substance Use Topics  . Alcohol use: No  . Drug use: No     Allergies   Other, Primatene mist [epinephrine], Shrimp [shellfish allergy], Aleve [naproxen sodium], Bactrim ds [sulfamethoxazole w/trimethoprim (co-trimoxazole)], Midol [ibuprofen], Avocado, Cocoa butter, and Latex   Review of Systems Review of Systems  Skin: Positive for wound (bite).  All other systems reviewed and are negative.    Physical Exam Updated Vital Signs BP (!) 137/96 (BP Location: Left Arm)   Pulse 98   Temp 98.5 F (36.9 C) (Oral)   Resp 18   SpO2 100%   Physical Exam Vitals signs and nursing note reviewed.  Constitutional:      Appearance: She is well-developed.  HENT:     Head: Normocephalic and atraumatic.  Eyes:     Conjunctiva/sclera: Conjunctivae normal.     Pupils: Pupils are equal, round, and reactive to light.  Neck:     Musculoskeletal: Normal range of motion.  Cardiovascular:     Rate and Rhythm: Normal rate and regular rhythm.     Heart  sounds: Normal heart sounds.  Pulmonary:     Effort: Pulmonary effort is normal.     Breath sounds: Normal breath sounds.  Abdominal:     General: Bowel sounds are normal.     Palpations: Abdomen is soft.  Musculoskeletal: Normal range of motion.     Comments: Insect bite noted to right posterior upper arm with localized reaction,  there is no induration or cellulitic change, no drainage or bleeding  Skin:    General: Skin is warm and dry.  Neurological:     Mental Status: She is alert and oriented to person, place, and time.      ED Treatments / Results  Labs (all labs ordered are listed, but only abnormal results are displayed) Labs Reviewed - No data to display  EKG None  Radiology No results found.  Procedures Procedures (including critical care time)  Medications Ordered in ED Medications  diphenhydrAMINE (BENADRYL) capsule 25 mg (has no administration in time range)     Initial Impression / Assessment and Plan / ED Course  I have reviewed the triage vital signs and the nursing notes.  Pertinent labs & imaging results that were available during my care of the patient were reviewed by me and considered in my medical decision making (see chart for details).  53 y.o. F here with insect bite of right upper arm that occurred 2 days ago.  Itching and burning localized to the area, no bodily rash, fever, chills, or sweats.  Insect bite with localized reaction, no signs of superimposed infection or cellulitis.  Plan for antihistamine and topical steroid cream.  Close follow-up with PCP.  Return here for any new/acute changes.  Final Clinical Impressions(s) / ED Diagnoses   Final diagnoses:  Insect bite of right upper arm, initial encounter    ED Discharge Orders         Ordered    diphenhydrAMINE (BENADRYL) 25 MG tablet  Every 6 hours     12/11/18 0052    hydrocortisone cream 1 %  2 times daily     12/11/18 0052           Larene Pickett, PA-C 12/11/18 0100     Ezequiel Essex, MD 12/11/18 [phone]

## 2018-12-11 NOTE — Discharge Instructions (Signed)
Take the prescribed medication as directed.  Benadryl can make you sleepy so keep this is mind before taking.  Can reserve for night time use if needed. Follow-up with your primary care doctor. Return to the ED for new or worsening symptoms.

## 2018-12-11 NOTE — ED Notes (Signed)
Patient verbalizes understanding of discharge instructions. Opportunity for questioning and answers were provided. Armband removed by staff, pt discharged from ED ambulatory.   

## 2018-12-14 ENCOUNTER — Ambulatory Visit (INDEPENDENT_AMBULATORY_CARE_PROVIDER_SITE_OTHER): Payer: PPO | Admitting: Internal Medicine

## 2018-12-14 ENCOUNTER — Encounter: Payer: Self-pay | Admitting: Internal Medicine

## 2018-12-14 VITALS — BP 136/84 | HR 88 | Temp 99.1°F | Wt 237.9 lb

## 2018-12-14 DIAGNOSIS — R202 Paresthesia of skin: Secondary | ICD-10-CM

## 2018-12-14 DIAGNOSIS — D509 Iron deficiency anemia, unspecified: Secondary | ICD-10-CM | POA: Diagnosis not present

## 2018-12-14 DIAGNOSIS — Z0000 Encounter for general adult medical examination without abnormal findings: Secondary | ICD-10-CM

## 2018-12-14 DIAGNOSIS — R200 Anesthesia of skin: Secondary | ICD-10-CM

## 2018-12-14 DIAGNOSIS — I10 Essential (primary) hypertension: Secondary | ICD-10-CM | POA: Diagnosis not present

## 2018-12-14 NOTE — Assessment & Plan Note (Signed)
BP Readings from Last 3 Encounters:  12/14/18 136/84  12/11/18 132/78  09/13/18 (!) 153/85   Assessment - Essential hypertension Pt is currently on no blood pressure medications - will continue to monitor, no need for medications at this time

## 2018-12-14 NOTE — Assessment & Plan Note (Signed)
Pt endorses a chronic history of L hand numbness that has recently worsened over the last year or so. States she has numbness in the median nerve distribution and denies any muscle weakness and joint swelling. The numbness does not limit her daily life at this time, but she says it is becoming more constant. She was evaluated with an EMG in 2014, which showed mild carpal tunnel of the LUE. Pt requesting referral to hand surgery for potential surgical intervention.  Assessment - L sided paresthesia consistent with carpal tunnel syndrome - encouraged pt to wear a wrist splint especially overnight - referral to hand surgery

## 2018-12-14 NOTE — Patient Instructions (Addendum)
[name],  It was nice meeting you today! I am glad you are doing well. I have ordered blood work today to check your iron and will call you with the results. Please continue to take your iron supplements at home. If you are unable to tolerate the pills, then we can set up IV iron infusions.  Wear a wrist splint to help with your carpal tunnel. You can find these at any drugstore or medical supply store. This will hopefully give you some pain relief before you are able to see the hand orthopedic doctors.  Please follow-up with me in 6 months.

## 2018-12-14 NOTE — Assessment & Plan Note (Signed)
Pt with history of iron deficiency anemia. States she takes her iron supplementation "once in a blue moon."  Assessment - Fe deficiency anemia - will recheck iron studies today - encouraged pt to take ferrous sulfate tablet everyday - if pt is unable to take or tolerate oral iron, then she can be set up with IV iron infusions in the future

## 2018-12-14 NOTE — Assessment & Plan Note (Addendum)
Today 12/14/2018 Pt declined influenza vaccine today. States she was scheduled for colonoscopy earlier this year. It was cancelled due to her getting the flu and the appointment was never rescheduled. She is interested in having another GI referral today.  Plan - referral for colonoscopy   Note from 03/27/2018 - Dr. Heber Campbell "Preventative care Patient is due for mammogram. Patient has asked for referral today.  She declined flu vaccine.  Plan - referral for mammogram"

## 2018-12-14 NOTE — Progress Notes (Signed)
   CC: Hypertension follow-up  HPI:  [name] is a 53 y.o. F with PMH as stated below. Please see problem based charting for follow-up on chronic conditions and health maintenance.   Past Medical History:  Diagnosis Date  . Adnexal cyst 2011   removed  . Anemia   . Arthritis    "neck; spine" (08/29/2014)  . Asthma   . Carpal tunnel syndrome of left wrist   . Chronic lower back pain   . Complication of anesthesia    difficult to awake  . Daily headache 12/2013   "tx'd and no problems anymore" (08/29/2014)  . Dyspnea    with asthma attack- none recent  . Family history of adverse reaction to anesthesia    "1 sister and both parents hard to wake up"  . Heart murmur    "dx'd when I was a teenager; haven't had any problems w/it"  . Hip pain, right   . History of bronchitis   . Hypertension   . Urinary incontinence   . Urinary urgency   . UTI (lower urinary tract infection)    Review of Systems:  Review of Systems  Constitutional: Negative for chills and fever.  Respiratory: Negative for shortness of breath.   Cardiovascular: Negative for chest pain.  Musculoskeletal:       L wrist pain and numbness  Skin: Positive for itching. Negative for rash.  Neurological: Negative for headaches.   Physical Exam: Vitals:   12/14/18 1005  BP: 136/84  Pulse: 88  Temp: 99.1 F (37.3 C)  TempSrc: Oral  SpO2: 98%  Weight: 237 lb 14.4 oz (107.9 kg)   Physical Exam Constitutional:      General: She is not in acute distress. Cardiovascular:     Rate and Rhythm: Normal rate and regular rhythm.     Heart sounds: Normal heart sounds.  Pulmonary:     Effort: Pulmonary effort is normal.     Breath sounds: Normal breath sounds.  Abdominal:     General: Bowel sounds are normal.     Palpations: Abdomen is soft.  Musculoskeletal:     Comments: Full strength and ROM in wrists bilaterally. No muscle atrophy appreciated. No tenderness, swelling, or deformities.   Neurological:     Mental Status: She is alert.    Assessment & Plan:   See Encounters Tab for problem based charting.  Patient seen with Dr. Philipp Ovens.

## 2018-12-15 LAB — IRON AND TIBC
Iron Saturation: 12 % — ABNORMAL LOW (ref 15–55)
Iron: 36 ug/dL (ref 27–159)
Total Iron Binding Capacity: 296 ug/dL (ref 250–450)
UIBC: 260 ug/dL (ref 131–425)

## 2018-12-15 NOTE — Progress Notes (Signed)
Internal Medicine Clinic Attending  I saw and evaluated the patient.  I personally confirmed the key portions of the history and exam documented by Dr. Jones and I reviewed pertinent patient test results.  The assessment, diagnosis, and plan were formulated together and I agree with the documentation in the resident's note.     

## 2018-12-15 NOTE — Addendum Note (Signed)
Addended by: Jodean Lima on: 12/15/2018 02:54 PM   Modules accepted: Level of Service

## 2018-12-21 DIAGNOSIS — M1812 Unilateral primary osteoarthritis of first carpometacarpal joint, left hand: Secondary | ICD-10-CM | POA: Diagnosis not present

## 2018-12-21 DIAGNOSIS — M19042 Primary osteoarthritis, left hand: Secondary | ICD-10-CM | POA: Diagnosis not present

## 2018-12-22 ENCOUNTER — Ambulatory Visit: Payer: PPO | Admitting: Obstetrics & Gynecology

## 2019-01-03 ENCOUNTER — Other Ambulatory Visit: Payer: Self-pay

## 2019-01-03 ENCOUNTER — Encounter: Payer: Self-pay | Admitting: Internal Medicine

## 2019-01-03 ENCOUNTER — Ambulatory Visit (INDEPENDENT_AMBULATORY_CARE_PROVIDER_SITE_OTHER): Payer: PPO | Admitting: Internal Medicine

## 2019-01-03 VITALS — BP 140/66

## 2019-01-03 DIAGNOSIS — Z0000 Encounter for general adult medical examination without abnormal findings: Secondary | ICD-10-CM

## 2019-01-03 NOTE — Patient Instructions (Addendum)
Annual Wellness Visit   Medicare Covered Preventative Screenings and Services  Services & Screenings Men and Women Who How Often Need? Date of Last Service Action  Abdominal Aortic Aneurysm Adults with AAA risk factors Once     Alcohol Misuse and Counseling All Adults Screening once a year if no alcohol misuse. Counseling up to 4 face to face sessions.     Bone Density Measurement  Adults at risk for osteoporosis Once every 2 yrs     Lipid Panel Z13.6 All adults without CV disease Once every 5 yrs     Colorectal Cancer   Stool sample or  Colonoscopy All adults 7 and older   Once every year  Every 10 years Yes  Keep appt with [facility] on 01/25/2019 to discuss colonoscopy  Depression All Adults Once a year  Today   Diabetes Screening Blood glucose, post glucose load, or GTT Z13.1  All adults at risk  Pre-diabetics  Once per year  Twice per year   A1c at next visit  Diabetes  Self-Management Training All adults Diabetics 10 hrs first year; 2 hours subsequent years. Requires Copay     Glaucoma  Diabetics  Family history of glaucoma  African Americans 73 yrs +  Hispanic Americans 57 yrs + Annually - requires coppay     Hepatitis C Z72.89 or F19.20  High Risk for HCV  Born between 1945 and 1965  Annually  Once     HIV Z11.4 All adults based on risk  Annually btw ages 47 & 55 regardless of risk  Annually > 65 yrs if at increased risk     Lung Cancer Screening Asymptomatic adults aged 54-77 with 30 pack yr history and current smoker OR quit within the last 15 yrs Annually Must have counseling and shared decision making documentation before first screen     Medical Nutrition Therapy Adults with   Diabetes  Renal disease  Kidney transplant within past 3 yrs 3 hours first year; 2 hours subsequent years     Obesity and Counseling All adults Screening once a year Counseling if BMI 30 or higher Yes Today   Tobacco Use Counseling Adults who use tobacco  Up to 8  visits in one year     Vaccines Z23  Hepatitis B  Influenza   Pneumonia  Adults   Once  Once every flu season  Two different vaccines separated by one year     Next Annual Wellness Visit People with Medicare Every year  Today     Services & Screenings Women Who How Often Need  Date of Last Service Action  Mammogram  Z12.31 Women over 30 One baseline ages 53-39. Annually ager 36 yrs+ Yes  [facility] at [phone] to schedule mammogram  Pap tests All women Annually if high risk. Every 2 yrs for normal risk women   Keep appt with Dr Hulan Fray on 02/02/2019 for pap smear  Screening for cervical cancer with   Pap (Z01.419 nl or Z01.411abnl) &  HPV Z11.51 Women aged 1 to 41 Once every 5 yrs     Screening pelvic and breast exams All women Annually if high risk. Every 2 yrs for normal risk women     Sexually Transmitted Diseases  Chlamydia  Gonorrhea  Syphilis All at risk adults Annually for non pregnant females at increased risk         Services & Screenings Men Who How Ofter Need  Date of Last Service Action  Prostate Cancer - DRE & PSA Men over 50 Annually.  DRE might require a copay.     Sexually Transmitted Diseases  Syphilis All at risk adults Annually for men at increased risk         Things That May Be Affecting Your Health:  Alcohol  Hearing loss  Pain    Depression  Home Safety  Sexual Health   Diabetes  Lack of physical activity  Stress   Difficulty with daily activities  Loneliness  Tiredness   Drug use  Medicines  Tobacco use   Falls  Motor Vehicle Safety X Weight   Food choices  Oral Health  Other    YOUR PERSONALIZED HEALTH PLAN : 1. Schedule your next subsequent Medicare Wellness visit in one year 2. Attend all of your regular appointments to address your medical issues 3. Complete the preventative screenings and services 4. Keep appt with [facility] on 01/25/2019 to discuss colonoscopy 5. Please contact [facility]  at [phone] to schedule mammogram 6. Keep appt with Dr Hulan Fray on 02/02/2019 for pap smear 7. A1c at next visit for diabetes screening 8. Keep up the good work walking 3 days per week, 23 minutes each time!    Fall Prevention in the Home, Adult Falls can cause injuries. They can happen to people of all ages. There are many things you can do to make your home safe and to help prevent falls. Ask for help when making these changes, if needed. What actions can I take to prevent falls? General Instructions  Use good lighting in all rooms. Replace any light bulbs that burn out.  Turn on the lights when you go into a dark area. Use night-lights.  Keep items that you use often in easy-to-reach places. Lower the shelves around your home if necessary.  Set up your furniture so you have a clear path. Avoid moving your furniture around.  Do not have throw rugs and other things on the floor that can make you trip.  Avoid walking on wet floors.  If any of your floors are uneven, fix them.  Add color or contrast paint or tape to clearly mark and help you see: ? Any grab bars or handrails. ? First and last steps of stairways. ? Where the edge of each step is.  If you use a stepladder: ? Make sure that it is fully opened. Do not climb a closed stepladder. ? Make sure that both sides of the stepladder are locked into place. ? Ask someone to hold the stepladder for you while you use it.  If there are any pets around you, be aware of where they are. What can I do in the bathroom?      Keep the floor dry. Clean up any water that spills onto the floor as soon as it happens.  Remove soap buildup in the tub or shower regularly.  Use non-skid mats or decals on the floor of the tub or shower.  Attach bath mats securely with double-sided, non-slip rug tape.  If you need to sit down in the shower, use a plastic, non-slip stool.  Install grab bars by the toilet and in the tub and shower. Do  not use towel bars as grab bars. What can I do in the bedroom?  Make sure that you have a light by your bed that is easy to reach.  Do not use any sheets or blankets that are too big for your bed. They should not hang  down onto the floor.  Have a firm chair that has side arms. You can use this for support while you get dressed. What can I do in the kitchen?  Clean up any spills right away.  If you need to reach something above you, use a strong step stool that has a grab bar.  Keep electrical cords out of the way.  Do not use floor polish or wax that makes floors slippery. If you must use wax, use non-skid floor wax. What can I do with my stairs?  Do not leave any items on the stairs.  Make sure that you have a light switch at the top of the stairs and the bottom of the stairs. If you do not have them, ask someone to add them for you.  Make sure that there are handrails on both sides of the stairs, and use them. Fix handrails that are broken or loose. Make sure that handrails are as long as the stairways.  Install non-slip stair treads on all stairs in your home.  Avoid having throw rugs at the top or bottom of the stairs. If you do have throw rugs, attach them to the floor with carpet tape.  Choose a carpet that does not hide the edge of the steps on the stairway.  Check any carpeting to make sure that it is firmly attached to the stairs. Fix any carpet that is loose or worn. What can I do on the outside of my home?  Use bright outdoor lighting.  Regularly fix the edges of walkways and driveways and fix any cracks.  Remove anything that might make you trip as you walk through a door, such as a raised step or threshold.  Trim any bushes or trees on the path to your home.  Regularly check to see if handrails are loose or broken. Make sure that both sides of any steps have handrails.  Install guardrails along the edges of any raised decks and porches.  Clear walking paths  of anything that might make someone trip, such as tools or rocks.  Have any leaves, snow, or ice cleared regularly.  Use sand or salt on walking paths during winter.  Clean up any spills in your garage right away. This includes grease or oil spills. What other actions can I take?  Wear shoes that: ? Have a low heel. Do not wear high heels. ? Have rubber bottoms. ? Are comfortable and fit you well. ? Are closed at the toe. Do not wear open-toe sandals.  Use tools that help you move around (mobility aids) if they are needed. These include: ? Canes. ? Walkers. ? Scooters. ? Crutches.  Review your medicines with your doctor. Some medicines can make you feel dizzy. This can increase your chance of falling. Ask your doctor what other things you can do to help prevent falls. Where to find more information  Centers for Disease Control and Prevention, STEADI: https://garcia.biz/  [employer] on Aging: BrainJudge.co.uk Contact a doctor if:  You are afraid of falling at home.  You feel weak, drowsy, or dizzy at home.  You fall at home. Summary  There are many simple things that you can do to make your home safe and to help prevent falls.  Ways to make your home safe include removing tripping hazards and installing grab bars in the bathroom.  Ask for help when making these changes in your home. This information is not intended to replace advice given to you by your  health care provider. Make sure you discuss any questions you have with your health care provider. Document Released: 01/23/2009 Document Revised: 07/20/2018 Document Reviewed: 11/11/2016 Elsevier Patient Education  2020 [place] Maintenance, Female Adopting a healthy lifestyle and getting preventive care are important in promoting health and wellness. Ask your health care provider about:  The right schedule for you to have regular tests and exams.  Things you can do on your own to  prevent diseases and keep yourself healthy. What should I know about diet, weight, and exercise? Eat a healthy diet   Eat a diet that includes plenty of vegetables, fruits, low-fat dairy products, and lean protein.  Do not eat a lot of foods that are high in solid fats, added sugars, or sodium. Maintain a healthy weight Body mass index (BMI) is used to identify weight problems. It estimates body fat based on height and weight. Your health care provider can help determine your BMI and help you achieve or maintain a healthy weight. Get regular exercise Get regular exercise. This is one of the most important things you can do for your health. Most adults should:  Exercise for at least 150 minutes each week. The exercise should increase your heart rate and make you sweat (moderate-intensity exercise).  Do strengthening exercises at least twice a week. This is in addition to the moderate-intensity exercise.  Spend less time sitting. Even light physical activity can be beneficial. Watch cholesterol and blood lipids Have your blood tested for lipids and cholesterol at 53 years of age, then have this test every 5 years. Have your cholesterol levels checked more often if:  Your lipid or cholesterol levels are high.  You are older than 53 years of age.  You are at high risk for heart disease. What should I know about cancer screening? Depending on your health history and family history, you may need to have cancer screening at various ages. This may include screening for:  Breast cancer.  Cervical cancer.  Colorectal cancer.  Skin cancer.  Lung cancer. What should I know about heart disease, diabetes, and high blood pressure? Blood pressure and heart disease  High blood pressure causes heart disease and increases the risk of stroke. This is more likely to develop in people who have high blood pressure readings, are of African descent, or are overweight.  Have your blood pressure  checked: ? Every 3-5 years if you are 69-25 years of age. ? Every year if you are 36 years old or older. Diabetes Have regular diabetes screenings. This checks your fasting blood sugar level. Have the screening done:  Once every three years after age 31 if you are at a normal weight and have a low risk for diabetes.  More often and at a younger age if you are overweight or have a high risk for diabetes. What should I know about preventing infection? Hepatitis B If you have a higher risk for hepatitis B, you should be screened for this virus. Talk with your health care provider to find out if you are at risk for hepatitis B infection. Hepatitis C Testing is recommended for:  Everyone born from [birth year] through 1965.  Anyone with known risk factors for hepatitis C. Sexually transmitted infections (STIs)  Get screened for STIs, including gonorrhea and chlamydia, if: ? You are sexually active and are younger than 53 years of age. ? You are older than 53 years of age and your health care provider tells you that you  are at risk for this type of infection. ? Your sexual activity has changed since you were last screened, and you are at increased risk for chlamydia or gonorrhea. Ask your health care provider if you are at risk.  Ask your health care provider about whether you are at high risk for HIV. Your health care provider may recommend a prescription medicine to help prevent HIV infection. If you choose to take medicine to prevent HIV, you should first get tested for HIV. You should then be tested every 3 months for as long as you are taking the medicine. Pregnancy  If you are about to stop having your period (premenopausal) and you may become pregnant, seek counseling before you get pregnant.  Take 400 to 800 micrograms (mcg) of folic acid every day if you become pregnant.  Ask for birth control (contraception) if you want to prevent pregnancy. Osteoporosis and menopause Osteoporosis is a  disease in which the bones lose minerals and strength with aging. This can result in bone fractures. If you are 66 years old or older, or if you are at risk for osteoporosis and fractures, ask your health care provider if you should:  Be screened for bone loss.  Take a calcium or vitamin D supplement to lower your risk of fractures.  Be given hormone replacement therapy (HRT) to treat symptoms of menopause. Follow these instructions at home: Lifestyle  Do not use any products that contain nicotine or tobacco, such as cigarettes, e-cigarettes, and chewing tobacco. If you need help quitting, ask your health care provider.  Do not use street drugs.  Do not share needles.  Ask your health care provider for help if you need support or information about quitting drugs. Alcohol use  Do not drink alcohol if: ? Your health care provider tells you not to drink. ? You are pregnant, may be pregnant, or are planning to become pregnant.  If you drink alcohol: ? Limit how much you use to 0-1 drink a day. ? Limit intake if you are breastfeeding.  Be aware of how much alcohol is in your drink. In the [place] one drink equals one 12 oz bottle of beer (355 mL), one 5 oz glass of wine (148 mL), or one 1 oz glass of hard liquor (44 mL). General instructions  Schedule regular health, dental, and eye exams.  Stay current with your vaccines.  Tell your health care provider if: ? You often feel depressed. ? You have ever been abused or do not feel safe at home. Summary  Adopting a healthy lifestyle and getting preventive care are important in promoting health and wellness.  Follow your health care provider's instructions about healthy diet, exercising, and getting tested or screened for diseases.  Follow your health care provider's instructions on monitoring your cholesterol and blood pressure. This information is not intended to replace advice given to you by your health care provider. Make sure  you discuss any questions you have with your health care provider. Document Released: 10/12/2010 Document Revised: 03/22/2018 Document Reviewed: 03/22/2018 Elsevier Patient Education  2020 [employer].

## 2019-01-03 NOTE — Progress Notes (Signed)
This AWV is being conducted by Shannon only. The patient was located at home and I was located in [facility]. The patient's identity was confirmed using their DOB and current address. The patient or his/her legal guardian has consented to being evaluated through a telephone encounter and understands the associated risks (an examination cannot be done and the patient may need to come in for an appointment) / benefits (allows the patient to remain at home, decreasing exposure to coronavirus). I personally spent 36 minutes conducting the AWV.  Subjective:   Brandi Chase is a 53 y.o. female who presents for a Medicare Annual Wellness Visit.  The following items have been reviewed and updated today in the appropriate area in the EMR.   Health Risk Assessment  Height, weight, BMI, and BP Visual acuity if needed Depression screen Fall risk / safety level Advance directive discussion Medical and family history were reviewed and updated Updating list of other providers & suppliers Medication reconciliation, including over the counter medicines Cognitive screen Written screening schedule Risk Factor list Personalized health advice, risky behaviors, and treatment advice  Social History   Social History Narrative   Current Social History 01/03/2019        Patient lives with daughter and 34 yo grandson in a second floor apartment. There are 14 steps with handrails up to the entrance the patient uses.       Patient's method of transportation is personal car.      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      The patient currently disabled.      Identified important Relationships are "My brother's girlfriend, they've been together 20 years."       Pets : None       Interests / Fun: "[place], reading"      Current Stressors: "Nothing really. I try not to let things stress me."       Religious / Personal Beliefs: "Christian"       L. Casady Voshell, RN, BSN             Objective:    Vitals: BP 140/66 (BP Location: Left Arm, Patient Position: Sitting, Cuff Size: Normal) Comment: At home Vitals are patient reported  Activities of Daily Living In your present state of health, do you have any difficulty performing the following activities: 01/03/2019 09/13/2018  Hearing? N N  Vision? Y Y  Comment - needs glasses   Difficulty concentrating or making decisions? N N  Walking or climbing stairs? Y Y  Comment "a little bit" -  Dressing or bathing? Y N  Comment socks and shoes -  Doing errands, shopping? N N  Some recent data might be hidden    Goals Goals    . Continue walking 3 days per week, 23 minutes each time (pt-stated)       Fall Risk Fall Risk  01/03/2019 09/13/2018 06/13/2018 03/27/2018 09/22/2017  Falls in the past year? 0 0 0 0 No  Number falls in past yr: - - - - -  Injury with Fall? - - - - -  Risk Factor Category  - - - - -  Risk for fall due to : - - - - -  Follow up Education provided;Falls prevention discussed Falls prevention discussed - - -    Depression Screen PHQ 2/9 Scores 01/03/2019 09/13/2018 06/13/2018 03/27/2018  PHQ - 2 Score 0 0 3 1  PHQ- 9 Score  8 - 8 7     Cognitive Testing Six-Item Cognitive Screener   "I would like to ask you some questions that ask you to use your memory. I am going to name three objects. Please wait until I say all three words, then repeat them. Remember what they are  because I am going to ask you to name them again in a few minutes. Please repeat these words for me: APPLE-TABLE-PENNY." (Interviewer may repeat names 3 times if necessary but repetition not scored.)  Did patient correctly repeat all three words? Yes - may proceed with screen  What year is this? Correct What month is this? Correct What day of the week is this? Correct  What were the three objects I asked you to remember? . Apple Correct . Table Correct . Penny Correct  Score one point for each  incorrect answer.  A score of 2 or more points warrants additional investigation.  Patient's score 0   Assessment and Plan:     Patient has appt 01/25/2019 with [facility] to discuss colonoscopy Has appt with OB/GYN on 02/02/2019 for pap Made aware of order for mammogram. Will mail [facility]. Patient will continue walking 23 minutes 3 days per week for exercise   During the course of the visit the patient was educated and counseled about appropriate screening and preventive services as documented in the assessment and plan.  The printed AVS was given to the patient and included an updated screening schedule, a list of risk factors, and personalized health advice.        Velora Heckler, RN  01/03/2019

## 2019-01-03 NOTE — Progress Notes (Signed)
I discussed the AWV findings with the RN who conducted the visit. I was present in the office suite and immediately available to provide assistance and direction throughout the time the service was provided.   

## 2019-01-08 NOTE — Progress Notes (Signed)
Internal Medicine Clinic Attending  I saw and evaluated the patient.  I personally confirmed the key portions of the history and exam documented by Dr. Jones and I reviewed pertinent patient test results.  The assessment, diagnosis, and plan were formulated together and I agree with the documentation in the resident's note.     

## 2019-01-16 ENCOUNTER — Telehealth: Payer: Self-pay

## 2019-01-16 NOTE — Telephone Encounter (Signed)
Pt calls and states she was told Friday that her sister in law has been hospitalized for COVID. She states she has not been very close to her but she has been around other family members that were around the sister in law. She states she has no systems. She was asking about testing and was given limited information about the [facility]. She was advised to keep hands clean, away from face, keep surfaces clean, to self isolate, to drink plenty of liquids and rest. To call for any symptoms. Was told would send her pcp note and she may receive a call back from dr Ronnald Ramp.

## 2019-01-16 NOTE — Telephone Encounter (Signed)
Requesting COVID-19 test. Please call pt back.  

## 2019-01-17 ENCOUNTER — Other Ambulatory Visit: Payer: Self-pay

## 2019-01-17 DIAGNOSIS — Z20822 Contact with and (suspected) exposure to covid-19: Secondary | ICD-10-CM

## 2019-01-17 DIAGNOSIS — Z20828 Contact with and (suspected) exposure to other viral communicable diseases: Secondary | ICD-10-CM | POA: Diagnosis not present

## 2019-01-17 NOTE — Telephone Encounter (Signed)
Called pt back. She stated she went to [facility] this morning and was tested for COVID. She is not have any symptoms currently. Encouraged her to keep up with good hand hygiene, hands away from face, and maintain social distancing. Pt had no further questions.

## 2019-01-19 ENCOUNTER — Telehealth: Payer: Self-pay | Admitting: Internal Medicine

## 2019-01-19 LAB — NOVEL CORONAVIRUS, NAA: SARS-CoV-2, NAA: NOT DETECTED

## 2019-01-19 NOTE — Telephone Encounter (Signed)
Patient called in upset after receiving her AVS for the AWV conducted 01/03/2019. States she doesn't understand why the only thing marked for affecting her health is weight. States this makes her sound like she sits around all day. States pain should also be checked as she lives with pain everyday after having 11 surgeries. Apologized that this was most likely an over site. Reminded patient that health plan congratulated her on the good job she is doing walking 3 days per week. Will make PCP aware. Hubbard Hartshorn, BSN, RN-BC

## 2019-02-02 ENCOUNTER — Ambulatory Visit (INDEPENDENT_AMBULATORY_CARE_PROVIDER_SITE_OTHER): Payer: PPO | Admitting: Obstetrics & Gynecology

## 2019-02-02 ENCOUNTER — Other Ambulatory Visit (HOSPITAL_COMMUNITY)
Admission: RE | Admit: 2019-02-02 | Discharge: 2019-02-02 | Disposition: A | Discharge status: Home / Self Care | Payer: PPO | Source: Ambulatory Visit | Attending: Obstetrics & Gynecology | Admitting: Obstetrics & Gynecology

## 2019-02-02 ENCOUNTER — Other Ambulatory Visit: Payer: Self-pay

## 2019-02-02 VITALS — BP 130/84 | HR 75 | Wt 237.7 lb

## 2019-02-02 DIAGNOSIS — B373 Candidiasis of vulva and vagina: Secondary | ICD-10-CM | POA: Diagnosis not present

## 2019-02-02 DIAGNOSIS — Z01419 Encounter for gynecological examination (general) (routine) without abnormal findings: Secondary | ICD-10-CM | POA: Diagnosis not present

## 2019-02-02 DIAGNOSIS — Z01411 Encounter for gynecological examination (general) (routine) with abnormal findings: Secondary | ICD-10-CM | POA: Diagnosis not present

## 2019-02-02 DIAGNOSIS — Z1151 Encounter for screening for human papillomavirus (HPV): Secondary | ICD-10-CM | POA: Insufficient documentation

## 2019-02-02 NOTE — Addendum Note (Signed)
Addended by: Emily Filbert on: 02/02/2019 09:02 AM   Modules accepted: Orders

## 2019-02-02 NOTE — Progress Notes (Addendum)
Subjective:    Brandi Chase is a 53 y.o. single P3 (27, 94, and 87 yo kids) who presents for an annual exam. The patient has no complaints today. Her periods are monthly, last 7 days, and she skipped 2 month's periods this year. The patient is sexually active. GYN screening history: last pap: was normal. The patient wears seatbelts: yes. The patient participates in regular exercise: yes. Has the patient ever been transfused or tattooed?: no. The patient reports that there is not domestic violence in her life.   Menstrual History: OB History    Gravida  3   Para  3   Term      Preterm      AB      Living  3     SAB      TAB      Ectopic      Multiple      Live Births              Menarche age: 67 Patient's last menstrual period was 11/27/2018 (exact date). Period Cycle (Days): 30 Period Duration (Days): 7 Period Pattern: (!) Irregular Menstrual Flow: Heavy Menstrual Control: Maxi pad Menstrual Control Change Freq (Hours): 2 hours Dysmenorrhea: None(pt reports 2 or 3 days before chest pain)  The following portions of the patient's history were reviewed and updated as appropriate: allergies, current medications, past family history, past medical history, past social history, past surgical history and problem list.  Review of Systems Pertinent items are noted in HPI.   FH- no breast/gyn/colon cancer She had a BTL. With her partner for 30 years but she is not sure that he is faithful Homemaker, on disability for back pain She has colonoscopy next month (02/27/19)   Objective:    BP 130/84   Pulse 75   Wt 237 lb 11.2 oz (107.8 kg)   LMP 11/27/2018 (Exact Date)   BMI 44.91 kg/m   General Appearance:    Alert, cooperative, no distress, appears stated age  Head:    Normocephalic, without obvious abnormality, atraumatic  Eyes:    PERRL, conjunctiva/corneas clear, EOM's intact, fundi    benign, both eyes  Ears:    Normal TM's and external ear canals, both  ears  Nose:   Nares normal, septum midline, mucosa normal, no drainage    or sinus tenderness  Throat:   Lips, mucosa, and tongue normal; teeth and gums normal  Neck:   Supple, symmetrical, trachea midline, no adenopathy;    thyroid:  no enlargement/tenderness/nodules; no carotid   bruit or JVD  Back:     Symmetric, no curvature, ROM normal, no CVA tenderness  Lungs:     Clear to auscultation bilaterally, respirations unlabored  Chest Wall:    No tenderness or deformity   Heart:    Regular rate and rhythm, S1 and S2 normal, no murmur, rub   or gallop  Breast Exam:    No tenderness, masses, or nipple abnormality  Abdomen:     Soft, non-tender, bowel sounds active all four quadrants,    no masses, no organomegaly  Genitalia:    Normal female without lesion, discharge or tenderness, normal size and shape, anteverted, mobile, non-tender, normal adnexal exam      Extremities:   Extremities normal, atraumatic, no cyanosis or edema  Pulses:   2+ and symmetric all extremities  Skin:   Skin color, texture, turgor normal, no rashes or lesions  Lymph nodes:   Cervical, supraclavicular, and axillary  nodes normal  Neurologic:   CNII-XII intact, normal strength, sensation and reflexes    throughout  .    Assessment:    Healthy female exam.    Plan:     Thin prep Pap smear. with cotesting STI testing per patient request She declines flu vaccine Mammogram ordered Fasting labs today

## 2019-02-03 LAB — LIPID PANEL
Chol/HDL Ratio: 3.4 ratio (ref 0.0–4.4)
Cholesterol, Total: 185 mg/dL (ref 100–199)
HDL: 55 mg/dL (ref >39)
LDL Chol Calc (NIH): 113 mg/dL — ABNORMAL HIGH (ref 0–99)
Triglycerides: 92 mg/dL (ref 0–149)
VLDL Cholesterol Cal: 17 mg/dL (ref 5–40)

## 2019-02-03 LAB — COMPREHENSIVE METABOLIC PANEL
ALT: 10 IU/L (ref 0–32)
AST: 14 IU/L (ref 0–40)
Albumin/Globulin Ratio: 1.2 (ref 1.2–2.2)
Albumin: 4.1 g/dL (ref 3.8–4.9)
Alkaline Phosphatase: 75 IU/L (ref 39–117)
BUN/Creatinine Ratio: 13 (ref 9–23)
BUN: 12 mg/dL (ref 6–24)
Bilirubin Total: 0.2 mg/dL (ref 0.0–1.2)
CO2: 23 mmol/L (ref 20–29)
Calcium: 9.4 mg/dL (ref 8.7–10.2)
Chloride: 105 mmol/L (ref 96–106)
Creatinine, Ser: 0.94 mg/dL (ref 0.57–1.00)
GFR calc Af Amer: 80 mL/min/{1.73_m2} (ref >59)
GFR calc non Af Amer: 69 mL/min/{1.73_m2} (ref >59)
Globulin, Total: 3.4 g/dL (ref 1.5–4.5)
Glucose: 107 mg/dL — ABNORMAL HIGH (ref 65–99)
Potassium: 4.9 mmol/L (ref 3.5–5.2)
Sodium: 139 mmol/L (ref 134–144)
Total Protein: 7.5 g/dL (ref 6.0–8.5)

## 2019-02-03 LAB — CBC
Hematocrit: 40.9 % (ref 34.0–46.6)
Hemoglobin: 12.9 g/dL (ref 11.1–15.9)
MCH: 26.7 pg (ref 26.6–33.0)
MCHC: 31.5 g/dL (ref 31.5–35.7)
MCV: 85 fL (ref 79–97)
Platelets: 296 10*3/uL (ref 150–450)
RBC: 4.84 x10E6/uL (ref 3.77–5.28)
RDW: 15.8 % — ABNORMAL HIGH (ref 11.7–15.4)
WBC: 7.8 10*3/uL (ref 3.4–10.8)

## 2019-02-03 LAB — HEMOGLOBIN A1C
Est. average glucose Bld gHb Est-mCnc: 123 mg/dL
Hgb A1c MFr Bld: 5.9 % — ABNORMAL HIGH (ref 4.8–5.6)

## 2019-02-03 LAB — RPR: RPR Ser Ql: NONREACTIVE

## 2019-02-03 LAB — HIV ANTIBODY (ROUTINE TESTING W REFLEX): HIV Screen 4th Generation wRfx: NONREACTIVE

## 2019-02-03 LAB — HEPATITIS C ANTIBODY: Hep C Virus Ab: <0.1 s/co ratio (ref 0.0–0.9)

## 2019-02-03 LAB — VITAMIN D 25 HYDROXY (VIT D DEFICIENCY, FRACTURES): Vit D, 25-Hydroxy: 18.2 ng/mL — ABNORMAL LOW (ref 30.0–100.0)

## 2019-02-03 LAB — HEPATITIS B SURFACE ANTIGEN: Hepatitis B Surface Ag: NEGATIVE

## 2019-02-03 LAB — TSH: TSH: 0.513 u[IU]/mL (ref 0.450–4.500)

## 2019-02-06 LAB — CYTOLOGY - PAP
Adequacy: ABSENT
Chlamydia: NEGATIVE
Comment: NEGATIVE
Comment: NEGATIVE
Comment: NORMAL
Diagnosis: NEGATIVE
High risk HPV: NEGATIVE
Neisseria Gonorrhea: NEGATIVE

## 2019-02-07 LAB — CERVICOVAGINAL ANCILLARY ONLY
Bacterial Vaginitis (gardnerella): NEGATIVE
Candida Glabrata: NEGATIVE
Candida Vaginitis: POSITIVE — AB
Comment: NEGATIVE
Comment: NEGATIVE
Comment: NEGATIVE
Comment: NEGATIVE
Trichomonas: NEGATIVE

## 2019-02-08 ENCOUNTER — Other Ambulatory Visit: Payer: Self-pay | Admitting: Obstetrics & Gynecology

## 2019-02-08 MED ORDER — FLUCONAZOLE 150 MG PO TABS: 150.0000 mg | ORAL_TABLET | Freq: Once | ORAL | 3 refills | Status: AC

## 2019-02-08 NOTE — Progress Notes (Signed)
Diflucan prescribed

## 2019-02-22 ENCOUNTER — Telehealth (INDEPENDENT_AMBULATORY_CARE_PROVIDER_SITE_OTHER): Payer: PPO | Admitting: Family Medicine

## 2019-02-22 DIAGNOSIS — B379 Candidiasis, unspecified: Secondary | ICD-10-CM

## 2019-02-22 DIAGNOSIS — Z1159 Encounter for screening for other viral diseases: Secondary | ICD-10-CM | POA: Diagnosis not present

## 2019-02-22 NOTE — Telephone Encounter (Signed)
The patient called in stating the medicine she was prescribed is making her neck rash up. She would like to know what she can take to make it stop.

## 2019-02-23 NOTE — Telephone Encounter (Signed)
Pt reports since she took the Diflucan she has had hives and is itching on her neck since. She says it started right after taking the pills. She has used Benadryl Gel and Calamine Lotion about 4 x a day. She feels like she is improving slightly.   She reports still has Vaginal Discharge but has decreased, discussed it can take a few days to clear. Discussed using OTC monistat if needed.   Pt reports oral Benadryl makes her very sleepy. Enc her to try tonight before bed. Enc pt to try cool compresses to the area also and to report to Urgent Care or PCP if she has worsening symptoms. Pt voiced understanding.   Diflucan added to allergy list due to reaction.

## 2019-02-27 DIAGNOSIS — K5730 Diverticulosis of large intestine without perforation or abscess without bleeding: Secondary | ICD-10-CM | POA: Diagnosis not present

## 2019-02-27 DIAGNOSIS — K640 First degree hemorrhoids: Secondary | ICD-10-CM | POA: Diagnosis not present

## 2019-02-27 DIAGNOSIS — Z1211 Encounter for screening for malignant neoplasm of colon: Secondary | ICD-10-CM | POA: Diagnosis not present

## 2019-03-15 ENCOUNTER — Ambulatory Visit (INDEPENDENT_AMBULATORY_CARE_PROVIDER_SITE_OTHER): Payer: PPO | Admitting: Internal Medicine

## 2019-03-15 ENCOUNTER — Other Ambulatory Visit: Payer: Self-pay

## 2019-03-15 ENCOUNTER — Encounter: Payer: Self-pay | Admitting: Internal Medicine

## 2019-03-15 VITALS — BP 138/80 | HR 85 | Temp 98.4°F | Wt 240.1 lb

## 2019-03-15 DIAGNOSIS — H9192 Unspecified hearing loss, left ear: Secondary | ICD-10-CM | POA: Insufficient documentation

## 2019-03-15 DIAGNOSIS — H6122 Impacted cerumen, left ear: Secondary | ICD-10-CM

## 2019-03-15 HISTORY — DX: Unspecified hearing loss, left ear: H91.92

## 2019-03-15 NOTE — Progress Notes (Signed)
   CC: Left ear pain and difficulty hearing  HPI:[name] is a 53 y.o. female who presents for evaluation of her left ear pain and difficulty hearing. Please see individual problem based A/P for details.   Past Medical History:  Diagnosis Date  . Adnexal cyst 2011   removed  . Anemia   . Arthritis    "neck; spine" (08/29/2014)  . Asthma   . Carpal tunnel syndrome of left wrist   . Chronic lower back pain   . Complication of anesthesia    difficult to awake  . Daily headache 12/2013   "tx'd and no problems anymore" (08/29/2014)  . Dyspnea    with asthma attack- none recent  . Family history of adverse reaction to anesthesia    "1 sister and both parents hard to wake up"  . Heart murmur    "dx'd when I was a teenager; haven't had any problems w/it"  . Hip pain, right   . History of bronchitis   . Hypertension   . Urinary incontinence   . Urinary urgency   . UTI (lower urinary tract infection)    Review of Systems:  ROS negative except as per HPI.  Physical Exam: Vitals:   03/15/19 1020  BP: 138/80  Pulse: 85  Temp: 98.4 F (36.9 C)  TempSrc: Oral  Weight: 240 lb 1.6 oz (108.9 kg)   General: A/O x4, in no acute distress, afebrile, nondiaphoretic HENT: PEERL, EMO intact Ears: Left tympanic membrane is entirely obscured by cerumen while the right is partially obscured. There is no edema or erythema noted  Cardio: RRR, no mrg's  Pulmonary: CTA bilaterally, no wheezing or crackles  Psych: Appropriate affect, not depressed in appearance, engages well  Assessment & Plan:   See Encounters Tab for problem based charting.  Patient discussed with Dr. Philipp Ovens

## 2019-03-15 NOTE — Progress Notes (Signed)
Internal Medicine Clinic Attending  Case discussed with Dr. Harbrecht at the time of the visit.  We reviewed the resident's history and exam and pertinent patient test results.  I agree with the assessment, diagnosis, and plan of care documented in the resident's note.   

## 2019-03-15 NOTE — Progress Notes (Deleted)
void

## 2019-03-15 NOTE — Assessment & Plan Note (Signed)
Left ear: The patient presents today for evaluation of a several week history of progressively worsening left ear discomfort, popping and decreased hearing. She stated that she has had difficulty in the past with cerumen impaction but not normally to the degree where it would affect her hearing. She denied fever, chills, sinus drainage, nasal drainage, headache, vision changes, watery eyes sore throat, cough or chest pain. On exam there is no tenderness to palpation or with pulling of the pinna.  Upon exam of the right external auditory canal there is presence of a moderate burden of cerumen with poor visualization of the tympanic membrane.  What can be viewed on the right demonstrates a normal light reflex with intact membrane and no air-fluid level.  On the left cerumen completely occludes the canal. Despite my insistence the patient visit ENT to have her ears cleaned the patient would like to have [place] flush her left ear due to the discomfort. Post flushing with water I am able to visualize left dependent membrane more clearly but there is still marked cerumen buildup.  Given the frequency of which she develops cerumen impaction I feel she would benefit from ENT evaluation and routine follow-up with them.  Plan: Nursing staff completed flushing of both ears bilaterally without complication with removal of a large burden of serum on the left Placed ENT referral for future evaluation and cleaning

## 2019-03-23 ENCOUNTER — Ambulatory Visit: Payer: PPO

## 2019-04-17 DIAGNOSIS — H903 Sensorineural hearing loss, bilateral: Secondary | ICD-10-CM | POA: Diagnosis not present

## 2019-04-24 ENCOUNTER — Telehealth: Payer: Self-pay | Admitting: Internal Medicine

## 2019-04-24 NOTE — Telephone Encounter (Signed)
Pt is given [facility] appt 1/13 at 0945. States leg cramps mostly at night, keeps her up

## 2019-04-24 NOTE — Telephone Encounter (Signed)
Patient requesting a call back about her leg spasms.  Patient also requesting what can she take.

## 2019-04-25 ENCOUNTER — Ambulatory Visit (INDEPENDENT_AMBULATORY_CARE_PROVIDER_SITE_OTHER): Payer: PPO | Admitting: Internal Medicine

## 2019-04-25 ENCOUNTER — Other Ambulatory Visit: Payer: Self-pay

## 2019-04-25 VITALS — BP 142/85 | HR 86 | Temp 98.2°F | Wt 234.7 lb

## 2019-04-25 DIAGNOSIS — G2581 Restless legs syndrome: Secondary | ICD-10-CM | POA: Diagnosis not present

## 2019-04-25 DIAGNOSIS — Z79899 Other long term (current) drug therapy: Secondary | ICD-10-CM | POA: Diagnosis not present

## 2019-04-25 DIAGNOSIS — R252 Cramp and spasm: Secondary | ICD-10-CM | POA: Insufficient documentation

## 2019-04-25 DIAGNOSIS — G4762 Sleep related leg cramps: Secondary | ICD-10-CM | POA: Diagnosis not present

## 2019-04-25 NOTE — Progress Notes (Signed)
Internal Medicine Clinic Attending  Case discussed with Dr. Helberg at the time of the visit.  We reviewed the resident's history and exam and pertinent patient test results.  I agree with the assessment, diagnosis, and plan of care documented in the resident's note.    

## 2019-04-25 NOTE — Assessment & Plan Note (Signed)
Patient presents with nocturnal leg cramping. She states that it only occurs at night. Seems to be worse in the right leg as compared to the left leg however does acknowledge that both legs are affected. Has encouraged to move her legs and typically with ambulating around the room her pain subsides. She has been evaluated in the past been told that she is restless leg syndrome associated with iron deficiency. She is on iron supplementation but only takes a intermittently she forgets. She denies fevers, chills, weakness, new rash.  A/P: - Repeat ferritin today  - TSH within normal limits  - Depending on her ferritin level, may need to continue treatment for iron deficiency. If her ferritin is within normal limits we will likely start Pramipexole 0.125 mg QHS

## 2019-04-25 NOTE — Patient Instructions (Addendum)
Thank you for allowing [place] to provide your care. I think you are likely suffering from restless leg syndrome. We will do some lab work today and I will call you with the results once available. Please continue to take care iron supplementation daily. You can take this with a glass of orange juice which should help with the absorption. Below is some additional information about restless leg syndrome.  Please come back to see her primary care doctor in three months or sooner if any issues arise.  Restless Legs Syndrome Restless legs syndrome is a condition that causes uncomfortable feelings or sensations in the legs, especially while sitting or lying down. The sensations usually cause an overwhelming urge to move the legs. The arms can also sometimes be affected. The condition can range from mild to severe. The symptoms often interfere with a person's ability to sleep. What are the causes? The cause of this condition is not known. What increases the risk? The following factors may make you more likely to develop this condition:  Being older than 50.  Pregnancy.  Being a woman. In general, the condition is more common in women than in men.  A family history of the condition.  Having iron deficiency.  Overuse of caffeine, nicotine, or alcohol.  Certain medical conditions, such as kidney disease, Parkinson's disease, or nerve damage.  Certain medicines, such as those for high blood pressure, nausea, colds, allergies, depression, and some heart conditions. What are the signs or symptoms? The main symptom of this condition is uncomfortable sensations in the legs, such as:  Pulling.  Tingling.  Prickling.  Throbbing.  Crawling.  Burning. Usually, the sensations:  Affect both sides of the body.  Are worse when you sit or lie down.  Are worse at night. These may wake you up or make it difficult to fall asleep.  Make you have a strong urge to move your legs.  Are temporarily  relieved by moving your legs. The arms can also be affected, but this is rare. People who have this condition often have tiredness during the day because of their lack of sleep at night. How is this diagnosed? This condition may be diagnosed based on:  Your symptoms.  Blood tests. In some cases, you may be monitored in a sleep lab by a specialist (a sleep study). This can detect any disruptions in your sleep. How is this treated? This condition is treated by managing the symptoms. This may include:  Lifestyle changes, such as exercising, using relaxation techniques, and avoiding caffeine, alcohol, or tobacco.  Medicines. Anti-seizure medicines may be tried first. Follow these instructions at home:     General instructions  Take over-the-counter and prescription medicines only as told by your health care provider.  Use methods to help relieve the uncomfortable sensations, such as: ? Massaging your legs. ? Walking or stretching. ? Taking a cold or hot bath.  Keep all follow-up visits as told by your health care provider. This is important. Lifestyle  Practice good sleep habits. For example, go to bed and get up at the same time every day. Most adults should get 7-9 hours of sleep each night.  Exercise regularly. Try to get at least 30 minutes of exercise most days of the week.  Practice ways of relaxing, such as yoga or meditation.  Avoid caffeine and alcohol.  Do not use any products that contain nicotine or tobacco, such as cigarettes and e-cigarettes. If you need help quitting, ask your health care provider.  Contact a health care provider if:  Your symptoms get worse or they do not improve with treatment. Summary  Restless legs syndrome is a condition that causes uncomfortable feelings or sensations in the legs, especially while sitting or lying down.  The symptoms often interfere with a person's ability to sleep.  This condition is treated by managing the symptoms.  You may need to make lifestyle changes or take medicines. This information is not intended to replace advice given to you by your health care provider. Make sure you discuss any questions you have with your health care provider. Document Revised: 04/18/2017 Document Reviewed: 04/18/2017 Elsevier Patient Education  [employer].

## 2019-04-25 NOTE — Progress Notes (Signed)
   CC: Leg Cramps  HPI:  [name] is a 54 y.o. female with PMHx listed below presenting for leg cramps. Please see the A&P for the status of the patient's chronic medical problems.  Past Medical History:  Diagnosis Date  . Adnexal cyst 2011   removed  . Anemia   . Arthritis    "neck; spine" (08/29/2014)  . Asthma   . Carpal tunnel syndrome of left wrist   . Chronic lower back pain   . Complication of anesthesia    difficult to awake  . Daily headache 12/2013   "tx'd and no problems anymore" (08/29/2014)  . Dyspnea    with asthma attack- none recent  . Family history of adverse reaction to anesthesia    "1 sister and both parents hard to wake up"  . Heart murmur    "dx'd when I was a teenager; haven't had any problems w/it"  . Hip pain, right   . History of bronchitis   . Hypertension   . Urinary incontinence   . Urinary urgency   . UTI (lower urinary tract infection)    Review of Systems:  Performed and all others negative.  Physical Exam: Vitals:   04/25/19 0954  BP: (!) 142/85  Pulse: 86  Temp: 98.2 F (36.8 C)  TempSrc: Oral  SpO2: 98%  Weight: 234 lb 11.2 oz (106.5 kg)   General: Obese female in no acute distress Pulm: Good air movement with no wheezing or crackles  CV: RRR, no murmurs, no rubs   Assessment & Plan:   See Encounters Tab for problem based charting.  Patient discussed with Dr. Lynnae January

## 2019-04-26 LAB — FERRITIN: Ferritin: 14 ng/mL — ABNORMAL LOW (ref 15–150)

## 2019-06-07 DIAGNOSIS — M25551 Pain in right hip: Secondary | ICD-10-CM | POA: Diagnosis not present

## 2019-06-07 DIAGNOSIS — Z96641 Presence of right artificial hip joint: Secondary | ICD-10-CM | POA: Diagnosis not present

## 2019-06-11 DIAGNOSIS — R262 Difficulty in walking, not elsewhere classified: Secondary | ICD-10-CM | POA: Diagnosis not present

## 2019-06-11 DIAGNOSIS — R2689 Other abnormalities of gait and mobility: Secondary | ICD-10-CM | POA: Diagnosis not present

## 2019-06-11 DIAGNOSIS — M6281 Muscle weakness (generalized): Secondary | ICD-10-CM | POA: Diagnosis not present

## 2019-06-11 DIAGNOSIS — Z471 Aftercare following joint replacement surgery: Secondary | ICD-10-CM | POA: Diagnosis not present

## 2019-06-13 DIAGNOSIS — M6281 Muscle weakness (generalized): Secondary | ICD-10-CM | POA: Diagnosis not present

## 2019-06-13 DIAGNOSIS — R2689 Other abnormalities of gait and mobility: Secondary | ICD-10-CM | POA: Diagnosis not present

## 2019-06-13 DIAGNOSIS — R262 Difficulty in walking, not elsewhere classified: Secondary | ICD-10-CM | POA: Diagnosis not present

## 2019-06-13 DIAGNOSIS — Z471 Aftercare following joint replacement surgery: Secondary | ICD-10-CM | POA: Diagnosis not present

## 2019-06-14 ENCOUNTER — Other Ambulatory Visit: Payer: Self-pay

## 2019-06-14 ENCOUNTER — Other Ambulatory Visit (HOSPITAL_COMMUNITY)
Admission: RE | Admit: 2019-06-14 | Discharge: 2019-06-14 | Disposition: A | Discharge status: Home / Self Care | Payer: PPO | Source: Ambulatory Visit | Attending: Family Medicine | Admitting: Family Medicine

## 2019-06-14 ENCOUNTER — Ambulatory Visit (INDEPENDENT_AMBULATORY_CARE_PROVIDER_SITE_OTHER): Payer: PPO | Admitting: *Deleted

## 2019-06-14 DIAGNOSIS — N898 Other specified noninflammatory disorders of vagina: Secondary | ICD-10-CM

## 2019-06-14 NOTE — Progress Notes (Signed)
Here for std check. C/o vaginal itching, burning and discharge. States took monistat one day around 06/06/19 when she made the appointment. . States itching and burning resolved but still having white discharge.  Will do self swab and check for GC, Trich, BV and yeast and treat if +. She voices understanding. Linda,RN

## 2019-06-15 LAB — CERVICOVAGINAL ANCILLARY ONLY
Bacterial Vaginitis (gardnerella): POSITIVE — AB
Candida Glabrata: NEGATIVE
Candida Vaginitis: POSITIVE — AB
Chlamydia: NEGATIVE
Comment: NEGATIVE
Comment: NEGATIVE
Comment: NEGATIVE
Comment: NEGATIVE
Comment: NEGATIVE
Comment: NORMAL
Neisseria Gonorrhea: NEGATIVE
Trichomonas: NEGATIVE

## 2019-06-18 ENCOUNTER — Other Ambulatory Visit: Payer: Self-pay | Admitting: Obstetrics and Gynecology

## 2019-06-18 DIAGNOSIS — R262 Difficulty in walking, not elsewhere classified: Secondary | ICD-10-CM | POA: Diagnosis not present

## 2019-06-18 DIAGNOSIS — M6281 Muscle weakness (generalized): Secondary | ICD-10-CM | POA: Diagnosis not present

## 2019-06-18 DIAGNOSIS — Z471 Aftercare following joint replacement surgery: Secondary | ICD-10-CM | POA: Diagnosis not present

## 2019-06-18 DIAGNOSIS — R2689 Other abnormalities of gait and mobility: Secondary | ICD-10-CM | POA: Diagnosis not present

## 2019-06-18 MED ORDER — METRONIDAZOLE 500 MG PO TABS: 500.0000 mg | ORAL_TABLET | Freq: Two times a day (BID) | ORAL | 0 refills | Status: DC

## 2019-06-18 NOTE — Progress Notes (Signed)
Patient seen and assessed by nursing staff during this encounter. I have reviewed the chart and agree with the documentation and plan.  Aletha Halim, MD 06/18/2019 8:00 AM

## 2019-06-20 DIAGNOSIS — R262 Difficulty in walking, not elsewhere classified: Secondary | ICD-10-CM | POA: Diagnosis not present

## 2019-06-20 DIAGNOSIS — R2689 Other abnormalities of gait and mobility: Secondary | ICD-10-CM | POA: Diagnosis not present

## 2019-06-20 DIAGNOSIS — Z471 Aftercare following joint replacement surgery: Secondary | ICD-10-CM | POA: Diagnosis not present

## 2019-06-20 DIAGNOSIS — M6281 Muscle weakness (generalized): Secondary | ICD-10-CM | POA: Diagnosis not present

## 2019-06-26 DIAGNOSIS — R262 Difficulty in walking, not elsewhere classified: Secondary | ICD-10-CM | POA: Diagnosis not present

## 2019-06-26 DIAGNOSIS — R2689 Other abnormalities of gait and mobility: Secondary | ICD-10-CM | POA: Diagnosis not present

## 2019-06-26 DIAGNOSIS — M6281 Muscle weakness (generalized): Secondary | ICD-10-CM | POA: Diagnosis not present

## 2019-06-26 DIAGNOSIS — Z471 Aftercare following joint replacement surgery: Secondary | ICD-10-CM | POA: Diagnosis not present

## 2019-07-02 DIAGNOSIS — R262 Difficulty in walking, not elsewhere classified: Secondary | ICD-10-CM | POA: Diagnosis not present

## 2019-07-02 DIAGNOSIS — M6281 Muscle weakness (generalized): Secondary | ICD-10-CM | POA: Diagnosis not present

## 2019-07-02 DIAGNOSIS — Z471 Aftercare following joint replacement surgery: Secondary | ICD-10-CM | POA: Diagnosis not present

## 2019-07-02 DIAGNOSIS — R2689 Other abnormalities of gait and mobility: Secondary | ICD-10-CM | POA: Diagnosis not present

## 2019-07-04 DIAGNOSIS — R262 Difficulty in walking, not elsewhere classified: Secondary | ICD-10-CM | POA: Diagnosis not present

## 2019-07-04 DIAGNOSIS — R2689 Other abnormalities of gait and mobility: Secondary | ICD-10-CM | POA: Diagnosis not present

## 2019-07-04 DIAGNOSIS — M6281 Muscle weakness (generalized): Secondary | ICD-10-CM | POA: Diagnosis not present

## 2019-07-04 DIAGNOSIS — Z471 Aftercare following joint replacement surgery: Secondary | ICD-10-CM | POA: Diagnosis not present

## 2019-08-01 ENCOUNTER — Encounter: Payer: Self-pay | Admitting: *Deleted

## 2019-09-28 ENCOUNTER — Other Ambulatory Visit: Payer: Self-pay

## 2019-09-28 ENCOUNTER — Other Ambulatory Visit (HOSPITAL_COMMUNITY)
Admission: RE | Admit: 2019-09-28 | Discharge: 2019-09-28 | Disposition: A | Discharge status: Home / Self Care | Payer: PPO | Source: Ambulatory Visit | Attending: Family Medicine | Admitting: Family Medicine

## 2019-09-28 ENCOUNTER — Ambulatory Visit (INDEPENDENT_AMBULATORY_CARE_PROVIDER_SITE_OTHER): Payer: PPO | Admitting: *Deleted

## 2019-09-28 ENCOUNTER — Encounter: Payer: Self-pay | Admitting: *Deleted

## 2019-09-28 VITALS — BP 122/85 | HR 76 | Ht 62.0 in | Wt 232.7 lb

## 2019-09-28 DIAGNOSIS — N898 Other specified noninflammatory disorders of vagina: Secondary | ICD-10-CM | POA: Insufficient documentation

## 2019-09-28 DIAGNOSIS — R1030 Lower abdominal pain, unspecified: Secondary | ICD-10-CM | POA: Insufficient documentation

## 2019-09-28 LAB — POCT URINALYSIS DIP (DEVICE)
Bilirubin Urine: NEGATIVE
Glucose, UA: NEGATIVE mg/dL
Ketones, ur: NEGATIVE mg/dL
Leukocytes,Ua: NEGATIVE
Nitrite: NEGATIVE
Protein, ur: NEGATIVE mg/dL
Specific Gravity, Urine: >=1.03 (ref 1.005–1.030)
Urobilinogen, UA: 0.2 mg/dL (ref 0.0–1.0)
pH: 5.5 (ref 5.0–8.0)

## 2019-09-28 NOTE — Progress Notes (Signed)
Pt reports pain in abdomen since 6/13. She denies painful urination or frequency. UA today shows small blood, urine culture ordered and sent to lab. She also reports yellow/brown vaginal discharge x1.5 weeks. She denies vaginal irritation, itching or swelling. Pt is sexually active. Self swab obtained. Pt will be notified of test results and treatment needed if any via MyChart. She voiced understanding.

## 2019-09-30 LAB — URINE CULTURE

## 2019-10-01 LAB — CERVICOVAGINAL ANCILLARY ONLY
Bacterial Vaginitis (gardnerella): POSITIVE — AB
Candida Glabrata: NEGATIVE
Candida Vaginitis: POSITIVE — AB
Chlamydia: NEGATIVE
Comment: NEGATIVE
Comment: NEGATIVE
Comment: NEGATIVE
Comment: NEGATIVE
Comment: NEGATIVE
Comment: NORMAL
Neisseria Gonorrhea: NEGATIVE
Trichomonas: NEGATIVE

## 2019-10-01 NOTE — Progress Notes (Signed)
I have reviewed this chart and agree with the RN/CMA assessment and management.    K. Meryl Stephanine Reas, M.D. Attending [facility] (Faculty Practice)   

## 2019-10-02 ENCOUNTER — Telehealth: Payer: Self-pay | Admitting: General Practice

## 2019-10-02 DIAGNOSIS — B9689 Other specified bacterial agents as the cause of diseases classified elsewhere: Secondary | ICD-10-CM

## 2019-10-02 DIAGNOSIS — B379 Candidiasis, unspecified: Secondary | ICD-10-CM

## 2019-10-02 MED ORDER — TERCONAZOLE 0.4 % VA CREA: 1.0000 | TOPICAL_CREAM | Freq: Every day | VAGINAL | 0 refills | Status: DC

## 2019-10-02 MED ORDER — METRONIDAZOLE 500 MG PO TABS: 500.0000 mg | ORAL_TABLET | Freq: Two times a day (BID) | ORAL | 0 refills | Status: DC

## 2019-10-02 MED ORDER — CEPHALEXIN 500 MG PO CAPS
500.0000 mg | ORAL_CAPSULE | Freq: Three times a day (TID) | ORAL | 0 refills | Status: DC
Start: 2019-10-02 — End: 2020-10-22

## 2019-10-02 NOTE — Telephone Encounter (Signed)
Patient called and left message on nurse voicemail line stating she wants to know if someone is going to send in a prescription for her based off her results.   Per chart review, patient has UTI, BV, & yeast infection. Prescriptions sent in per Dr Rip Harbour. Called patient, no answer- left message stating we are trying to reach her to return her phone call. Her prescriptions have been sent in and she may call us back if she has questions.

## 2019-10-03 ENCOUNTER — Other Ambulatory Visit: Payer: Self-pay | Admitting: Obstetrics and Gynecology

## 2019-10-03 MED ORDER — NITROFURANTOIN MONOHYD MACRO 100 MG PO CAPS: 100.0000 mg | ORAL_CAPSULE | Freq: Two times a day (BID) | ORAL | 0 refills | Status: DC

## 2019-10-26 ENCOUNTER — Other Ambulatory Visit: Payer: Self-pay | Admitting: Internal Medicine

## 2019-10-26 ENCOUNTER — Encounter: Payer: Self-pay | Admitting: Internal Medicine

## 2019-10-26 ENCOUNTER — Ambulatory Visit (INDEPENDENT_AMBULATORY_CARE_PROVIDER_SITE_OTHER): Payer: PPO | Admitting: Internal Medicine

## 2019-10-26 VITALS — BP 131/94 | HR 83 | Temp 98.9°F | Wt 232.6 lb

## 2019-10-26 DIAGNOSIS — N83209 Unspecified ovarian cyst, unspecified side: Secondary | ICD-10-CM

## 2019-10-26 DIAGNOSIS — N83202 Unspecified ovarian cyst, left side: Secondary | ICD-10-CM

## 2019-10-26 DIAGNOSIS — Z1231 Encounter for screening mammogram for malignant neoplasm of breast: Secondary | ICD-10-CM

## 2019-10-26 HISTORY — DX: Unspecified ovarian cyst, unspecified side: N83.209

## 2019-10-26 NOTE — Assessment & Plan Note (Signed)
Agreeable to getting mammogram. Order placed

## 2019-10-26 NOTE — Progress Notes (Signed)
   CC: Abdominal pain  HPI: Ms.Zareah Viona Gilmore Lastinger is a 54 y.o. with PMH listed below presenting with complaint of abdominal pain. Please see problem based assessment and plan for further details.  Past Medical History:  Diagnosis Date  . Adnexal cyst 2011   removed  . Anemia   . Arthritis    "neck; spine" (08/29/2014)  . Asthma   . Carpal tunnel syndrome of left wrist   . Chronic lower back pain   . Complication of anesthesia    difficult to awake  . Daily headache 12/2013   "tx'd and no problems anymore" (08/29/2014)  . Dyspnea    with asthma attack- none recent  . Family history of adverse reaction to anesthesia    "1 sister and both parents hard to wake up"  . Heart murmur    "dx'd when I was a teenager; haven't had any problems w/it"  . Hip pain, right   . History of bronchitis   . Hypertension   . Urinary incontinence   . Urinary urgency   . UTI (lower urinary tract infection)    Review of Systems: Review of Systems  Constitutional: Negative for chills, fever and malaise/fatigue.  Eyes: Negative for blurred vision.  Respiratory: Negative for shortness of breath.   Cardiovascular: Negative for chest pain, palpitations and leg swelling.  Gastrointestinal: Positive for abdominal pain. Negative for diarrhea, nausea and vomiting.  Genitourinary: Negative for dysuria.  Neurological: Negative for dizziness.  All other systems reviewed and are negative.    Physical Exam: Vitals:   10/26/19 1102  BP: (!) 131/94  Pulse: 83  Temp: 98.9 F (37.2 C)  TempSrc: Oral  SpO2: 98%  Weight: 232 lb 9.6 oz (105.5 kg)   Gen: Well-developed, well nourished, NAD HEENT: NCAT head, hearing intact CV: RRR, S1, S2 normal Pulm: CTAB, No rales, no wheezes Abd: Soft, BS+, Deep tenderness to palpation on LLQ, no rebound tenderness, no guarding Extm: ROM intact, Peripheral pulses intact, No peripheral edema Skin: Dry, Warm, normal turgor  Assessment & Plan:   Encounter for  screening mammogram for malignant neoplasm of breast Agreeable to getting mammogram. Order placed  Ruptured cyst of ovary Ms.Marrin is a 54 yo F w/ PMH of asthma, iron defiency anemia, DJD presenting to [facility] with abdominal pain. She was in her usual state of health until 3 days ago when she developed sudden onset crampy abdominal pain without any inciting event. She denies any prior hx and has not found any alleviating factors. Pain exacerbated by proning. She denies any nausea, vomiting, diarrhea or vaginal discharge. Denies any fevers, chills, palpitations. She mentions having one particular episode of sever pain last night and so made walk-in appointment to [facility] to avoid the ED but when she woke up this morning, her pain has significantly improved and she feels well.  A/P Presenting w/ sudden onset abdominal pain. Acute onset w/o inciting event. Physical exam shows LLQ tenderness without rebound or guarding. Denies diarrhea, vaginal discharge, or systemic symptoms concerning for active infection. Pain self-resolved with time. Currently appears comfortable. Transient nature of pain and location along with prior hx of multiple ovarian cysts consistent with ruptured cyst. No need for intervention at this time.  - Information on ovarian cysts provided - Advised on red-flag symptoms - F/u with Ob/Gyn    Patient discussed with Dr. Jimmye Norman  -Gilberto Better, [facility] Pager: [phone]

## 2019-10-26 NOTE — Patient Instructions (Signed)
Thank you for allowing [place] to provide your care today. Today we discussed your pelvic pain    I have ordered no labs for you. I will call if any are abnormal.    Today we made no changes to your medications.    Please take your ibuprofen as needed for pain.  Please follow-up as needed.    Should you have any questions or concerns please call the internal medicine clinic at [phone].     Ovarian Cyst An ovarian cyst is a fluid-filled sac on an ovary. The ovaries are organs that make eggs in women. Most ovarian cysts go away on their own and are not cancerous (are benign). Some cysts need treatment. Follow these instructions at home:  Take over-the-counter and prescription medicines only as told by your doctor.  Do not drive or use heavy machinery while taking prescription pain medicine.  Get pelvic exams and Pap tests as often as told by your doctor.  Return to your normal activities as told by your doctor. Ask your doctor what activities are safe for you.  Do not use any products that contain nicotine or tobacco, such as cigarettes and e-cigarettes. If you need help quitting, ask your doctor.  Keep all follow-up visits as told by your doctor. This is important. Contact a doctor if:  Your periods are: ? Late. ? Irregular. ? Painful.   Your periods stop.  You have pelvic pain that does not go away.  You have pressure on your bladder.  You have trouble making your bladder empty when you pee (urinate).  You have pain during sex.  You have any of the following in your belly (abdomen): ? A feeling of fullness. ? Pressure. ? Discomfort. ? Pain that does not go away. ? Swelling.  You feel sick most of the time.  You have trouble pooping (have constipation).  You are not as hungry as usual (you lose your appetite).  You get very bad acne.  You start to have more hair on your body and face.  You are gaining weight or losing weight without changing your exercise  and eating habits.  You think you may be pregnant. Get help right away if:  You have belly pain that is very bad or gets worse.  You cannot eat or drink without throwing up (vomiting).  You suddenly get a fever.  Your period is a lot heavier than usual. This information is not intended to replace advice given to you by your health care provider. Make sure you discuss any questions you have with your health care provider. Document Revised: 03/11/2017 Document Reviewed: 08/31/2015 Elsevier Patient Education  2020 [employer].

## 2019-10-26 NOTE — Assessment & Plan Note (Signed)
Brandi Chase is a 54 yo F w/ PMH of asthma, iron defiency anemia, DJD presenting to [facility] with abdominal pain. She was in her usual state of health until 3 days ago when she developed sudden onset crampy abdominal pain without any inciting event. She denies any prior hx and has not found any alleviating factors. Pain exacerbated by proning. She denies any nausea, vomiting, diarrhea or vaginal discharge. Denies any fevers, chills, palpitations. She mentions having one particular episode of sever pain last night and so made walk-in appointment to [facility] to avoid the ED but when she woke up this morning, her pain has significantly improved and she feels well.  A/P Presenting w/ sudden onset abdominal pain. Acute onset w/o inciting event. Physical exam shows LLQ tenderness without rebound or guarding. Denies diarrhea, vaginal discharge, or systemic symptoms concerning for active infection. Pain self-resolved with time. Currently appears comfortable. Transient nature of pain and location along with prior hx of multiple ovarian cysts consistent with ruptured cyst. No need for intervention at this time.  - Information on ovarian cysts provided - Advised on red-flag symptoms - F/u with Ob/Gyn

## 2019-10-29 ENCOUNTER — Other Ambulatory Visit: Payer: Self-pay

## 2019-10-29 ENCOUNTER — Ambulatory Visit
Admission: RE | Admit: 2019-10-29 | Discharge: 2019-10-29 | Disposition: A | Discharge status: Home / Self Care | Payer: PPO | Source: Ambulatory Visit | Attending: Internal Medicine | Admitting: Internal Medicine

## 2019-10-29 DIAGNOSIS — Z1231 Encounter for screening mammogram for malignant neoplasm of breast: Secondary | ICD-10-CM | POA: Diagnosis not present

## 2019-10-29 NOTE — Progress Notes (Signed)
Internal Medicine Clinic Attending  Case discussed with Dr. Truman Hayward  At the time of the visit.  We reviewed the resident's history and exam and pertinent patient test results.  I agree with the assessment, diagnosis, and plan of care documented in the resident's note. 3

## 2019-12-06 DIAGNOSIS — Z471 Aftercare following joint replacement surgery: Secondary | ICD-10-CM | POA: Diagnosis not present

## 2019-12-06 DIAGNOSIS — Z96641 Presence of right artificial hip joint: Secondary | ICD-10-CM | POA: Diagnosis not present

## 2019-12-06 DIAGNOSIS — Z96642 Presence of left artificial hip joint: Secondary | ICD-10-CM | POA: Diagnosis not present

## 2019-12-13 ENCOUNTER — Encounter: Payer: Self-pay | Admitting: Student

## 2019-12-13 ENCOUNTER — Other Ambulatory Visit (HOSPITAL_COMMUNITY)
Admission: RE | Admit: 2019-12-13 | Discharge: 2019-12-13 | Disposition: A | Discharge status: Home / Self Care | Payer: PPO | Source: Ambulatory Visit | Attending: Internal Medicine | Admitting: Internal Medicine

## 2019-12-13 ENCOUNTER — Ambulatory Visit (INDEPENDENT_AMBULATORY_CARE_PROVIDER_SITE_OTHER): Payer: PPO | Admitting: Student

## 2019-12-13 ENCOUNTER — Other Ambulatory Visit: Payer: Self-pay

## 2019-12-13 VITALS — BP 153/87 | HR 71 | Temp 98.7°F | Ht 62.0 in | Wt 231.2 lb

## 2019-12-13 DIAGNOSIS — J45909 Unspecified asthma, uncomplicated: Secondary | ICD-10-CM

## 2019-12-13 DIAGNOSIS — Z0000 Encounter for general adult medical examination without abnormal findings: Secondary | ICD-10-CM

## 2019-12-13 DIAGNOSIS — R7303 Prediabetes: Secondary | ICD-10-CM | POA: Insufficient documentation

## 2019-12-13 DIAGNOSIS — Z7251 High risk heterosexual behavior: Secondary | ICD-10-CM | POA: Diagnosis not present

## 2019-12-13 DIAGNOSIS — J4520 Mild intermittent asthma, uncomplicated: Secondary | ICD-10-CM

## 2019-12-13 DIAGNOSIS — I10 Essential (primary) hypertension: Secondary | ICD-10-CM | POA: Diagnosis not present

## 2019-12-13 DIAGNOSIS — Z8742 Personal history of other diseases of the female genital tract: Secondary | ICD-10-CM | POA: Diagnosis not present

## 2019-12-13 DIAGNOSIS — N83209 Unspecified ovarian cyst, unspecified side: Secondary | ICD-10-CM

## 2019-12-13 DIAGNOSIS — R1032 Left lower quadrant pain: Secondary | ICD-10-CM | POA: Diagnosis not present

## 2019-12-13 MED ORDER — LOSARTAN POTASSIUM 50 MG PO TABS: 50.0000 mg | ORAL_TABLET | Freq: Every day | ORAL | 0 refills | Status: DC

## 2019-12-13 NOTE — Progress Notes (Signed)
   CC: AWV  HPI:  Brandi Chase is a 54 y.o.  With hypertension, asthma, hx of dysuterine bleeding presenting today in clinic for annual wellness visit.  Please see problem-based list for further details.  Past Medical History:  Diagnosis Date  . Adnexal cyst 2011   removed  . Anemia   . Arthritis    "neck; spine" (08/29/2014)  . Asthma   . Carpal tunnel syndrome of left wrist   . Chronic lower back pain   . Complication of anesthesia    difficult to awake  . Daily headache 12/2013   "tx'd and no problems anymore" (08/29/2014)  . Dyspnea    with asthma attack- none recent  . Family history of adverse reaction to anesthesia    "1 sister and both parents hard to wake up"  . Heart murmur    "dx'd when I was a teenager; haven't had any problems w/it"  . Hip pain, right   . History of bronchitis   . Hypertension   . Urinary incontinence   . Urinary urgency   . UTI (lower urinary tract infection)    Review of Systems:  As per HPI  Physical Exam:  Vitals:   12/13/19 1358  BP: (!) 149/92  Pulse: 80  Temp: 98.7 F (37.1 C)  TempSrc: Oral  SpO2: 97%  Weight: 231 lb 3.2 oz (104.9 kg)  Height: 5\' 2"  (1.575 m)   Physical Exam Constitutional:      General: She is not in acute distress.    Appearance: She is not ill-appearing.  HENT:     Head: Normocephalic and atraumatic.  Cardiovascular:     Rate and Rhythm: Normal rate and regular rhythm.  Pulmonary:     Effort: Pulmonary effort is normal.     Breath sounds: Normal breath sounds.  Abdominal:     General: Bowel sounds are normal.     Palpations: Abdomen is soft.     Comments: Mild LLQ tenderness  Neurological:     Mental Status: She is alert.  Psychiatric:        Mood and Affect: Mood normal.        Behavior: Behavior normal.     Assessment & Plan:   See Encounters Tab for problem based charting.  Patient seen with Dr. Dareen Piano

## 2019-12-13 NOTE — Patient Instructions (Addendum)
Ms. Bhargava,  It was a pleasure seeing you today!  Today was your annual wellness visit. During this visit, we discussed your past medical history, including hypertension, asthma, and dysfunctional uterine bleeding. It appears you are doing well overall! We will check some blood work today to check your cholesterol, blood sugars, kidney function, and blood counts.  We also discussed importance of weight loss and exercise. It is important to do these things now before future problems arise. Obesity, diabetes, hypertension, and high cholesterol put you at a very high risk of heart attacks, stroke, and blood vessel disease. If you would like to set up an appointment with our nutritionist, please call the clinic.  I encourage you to get the COVID-19 vaccine. The risk of blood clots is higher in the infection than it is with the vaccine. You can get the vaccine at any local pharmacy. Here is a number you can call for more information regarding testing and vaccines: [phone].  We will see you back in 6 months to see how your blood pressure is doing. I am starting you losartan (COZAAR) 50mg , once daily. If you started to feel dizziness, HA, or short of breath please call the clinic.  We look forward to seeing you next time. Please call our clinic at [phone] if you have any questions or concerns. The best time to call is Monday-Friday from 9am-4pm, but there is someone available 24/7 at the same number. If you need medication refills, please notify your pharmacy one week in advance and they will send [place] a request.  Thank you for letting us take part in your care. Wishing you the best!  Thank you, Dr. Sanjuan Dame, MD

## 2019-12-13 NOTE — Assessment & Plan Note (Signed)
Hx of ovarian cyst: Patient discussed she previously had ovarian cysts removed. States she has some intermittent lower abdominal pain. She says she has an appointment with her OBGYN next month to discuss further steps.  Assessment/Plan: -F/u OBGYN

## 2019-12-13 NOTE — Assessment & Plan Note (Signed)
Asthma: Patient reports remote history of asthma. States she does not have to use her inhaler very often.  Assessment/Plan: -Asthma well-controlled, will continue with albuterol PRN

## 2019-12-13 NOTE — Assessment & Plan Note (Signed)
Hypertension: Patient does not take hypertensive medications. Denies HA, blurry vision, CP, SOB. Upon arrival today, BP 149/92 after patient walked from parking deck. After interview with patient, repeat BP 153/87.   Assessment/Plan: -Starting Cozaar 50mg  qd -Discussed importance of weight loss and exercise to prevent further cardiovascular disease. -F/u 49mo  BP Readings from Last 3 Encounters:  12/13/19 (!) 153/87  10/26/19 (!) 131/94  09/28/19 122/85

## 2019-12-14 LAB — CERVICOVAGINAL ANCILLARY ONLY
Bacterial Vaginitis (gardnerella): NEGATIVE
Candida Glabrata: NEGATIVE
Candida Vaginitis: NEGATIVE
Chlamydia: NEGATIVE
Comment: NEGATIVE
Comment: NEGATIVE
Comment: NEGATIVE
Comment: NEGATIVE
Comment: NEGATIVE
Comment: NORMAL
Neisseria Gonorrhea: NEGATIVE
Trichomonas: NEGATIVE

## 2019-12-14 LAB — CBC
Hematocrit: 45.7 % (ref 34.0–46.6)
Hemoglobin: 14.9 g/dL (ref 11.1–15.9)
MCH: 28.4 pg (ref 26.6–33.0)
MCHC: 32.6 g/dL (ref 31.5–35.7)
MCV: 87 fL (ref 79–97)
Platelets: 267 10*3/uL (ref 150–450)
RBC: 5.24 x10E6/uL (ref 3.77–5.28)
RDW: 14.5 % (ref 11.7–15.4)
WBC: 7.9 10*3/uL (ref 3.4–10.8)

## 2019-12-14 LAB — LIPID PANEL
Chol/HDL Ratio: 3.9 ratio (ref 0.0–4.4)
Cholesterol, Total: 209 mg/dL — ABNORMAL HIGH (ref 100–199)
HDL: 53 mg/dL (ref >39)
LDL Chol Calc (NIH): 141 mg/dL — ABNORMAL HIGH (ref 0–99)
Triglycerides: 81 mg/dL (ref 0–149)
VLDL Cholesterol Cal: 15 mg/dL (ref 5–40)

## 2019-12-14 LAB — BMP8+ANION GAP
Anion Gap: 15 mmol/L (ref 10.0–18.0)
BUN/Creatinine Ratio: 12 (ref 9–23)
BUN: 11 mg/dL (ref 6–24)
CO2: 22 mmol/L (ref 20–29)
Calcium: 9.6 mg/dL (ref 8.7–10.2)
Chloride: 104 mmol/L (ref 96–106)
Creatinine, Ser: 0.91 mg/dL (ref 0.57–1.00)
GFR calc Af Amer: 83 mL/min/{1.73_m2} (ref >59)
GFR calc non Af Amer: 72 mL/min/{1.73_m2} (ref >59)
Glucose: 80 mg/dL (ref 65–99)
Potassium: 4.5 mmol/L (ref 3.5–5.2)
Sodium: 141 mmol/L (ref 134–144)

## 2019-12-14 LAB — RPR: RPR Ser Ql: NONREACTIVE

## 2019-12-14 LAB — HEMOGLOBIN A1C
Est. average glucose Bld gHb Est-mCnc: 123 mg/dL
Hgb A1c MFr Bld: 5.9 % — ABNORMAL HIGH (ref 4.8–5.6)

## 2019-12-14 LAB — HIV ANTIBODY (ROUTINE TESTING W REFLEX): HIV Screen 4th Generation wRfx: NONREACTIVE

## 2019-12-18 ENCOUNTER — Telehealth: Payer: Self-pay | Admitting: Student

## 2019-12-18 NOTE — Telephone Encounter (Signed)
Called pt- stated someone had called her Friday and on Saturday; unsure who called. She saw Dr Collene Gobble on 9/2.

## 2019-12-18 NOTE — Progress Notes (Signed)
Internal Medicine Clinic Attending  I saw and evaluated the patient.  I personally confirmed the key portions of the history and exam documented by Dr. Braswell and I reviewed pertinent patient test results.  The assessment, diagnosis, and plan were formulated together and I agree with the documentation in the resident's note.  

## 2019-12-18 NOTE — Telephone Encounter (Signed)
Pls contact pt regarding medicine [phone]

## 2019-12-19 ENCOUNTER — Telehealth: Payer: Self-pay | Admitting: *Deleted

## 2019-12-19 NOTE — Telephone Encounter (Signed)
Patient called back and I listened to the vm she called about earlier this week regarding a PA calling her.  The message was a misdial and for a female patient not her. She wanted you to know it was straightened out now.

## 2019-12-19 NOTE — Telephone Encounter (Signed)
I called and spoke to the patient.  She states that she does not have any questions regarding her medications.  She states that she received a voicemail from a PA on Saturday.  The PA said that she will leave something on my chart but patient was not able to see it.  Patient did not know what office the PA was calling from.  We will try to identify this PA.    Gaylan Gerold, DO

## 2019-12-25 ENCOUNTER — Telehealth: Payer: Self-pay | Admitting: Student

## 2019-12-25 DIAGNOSIS — R1030 Lower abdominal pain, unspecified: Secondary | ICD-10-CM

## 2019-12-25 NOTE — Addendum Note (Signed)
Addended by: Truddie Crumble on: 12/25/2019 04:02 PM   Modules accepted: Orders

## 2019-12-25 NOTE — Telephone Encounter (Signed)
Called patient to discuss results from last visit. Discussed elevated LDL and 10-year ASCVD risk 9%. Explained this is a moderate risk, and she can decide whether to start medication or trial lifestyle modification. Patient mentioned she has already started changing her diet and would like to hold off medication. She mentions she is continuing to have lower abdominal pain. States pain has worsened since visit, believes it to be UTI or 2/2 cyst. Denies fever, dysuria. She has OBGYN appointment in October.   -Plan to schedule lab visit for urine collection

## 2019-12-26 ENCOUNTER — Other Ambulatory Visit: Payer: PPO

## 2019-12-28 ENCOUNTER — Other Ambulatory Visit: Payer: PPO

## 2019-12-28 ENCOUNTER — Other Ambulatory Visit: Payer: Self-pay

## 2019-12-28 DIAGNOSIS — R1030 Lower abdominal pain, unspecified: Secondary | ICD-10-CM

## 2019-12-29 LAB — URINALYSIS, COMPLETE
Bilirubin, UA: NEGATIVE
Glucose, UA: NEGATIVE
Ketones, UA: NEGATIVE
Leukocytes,UA: NEGATIVE
Nitrite, UA: NEGATIVE
Protein,UA: NEGATIVE
RBC, UA: NEGATIVE
Specific Gravity, UA: 1.019 (ref 1.005–1.030)
Urobilinogen, Ur: 0.2 mg/dL (ref 0.2–1.0)
pH, UA: 5 (ref 5.0–7.5)

## 2019-12-29 LAB — MICROSCOPIC EXAMINATION
Casts: NONE SEEN /lpf
RBC, Urine: NONE SEEN /hpf (ref 0–2)

## 2019-12-31 NOTE — Addendum Note (Signed)
Addended bySanjuan Dame on: 12/31/2019 12:06 PM   Modules accepted: Orders

## 2020-01-01 ENCOUNTER — Ambulatory Visit: Payer: PPO | Attending: Internal Medicine

## 2020-01-01 DIAGNOSIS — Z23 Encounter for immunization: Secondary | ICD-10-CM

## 2020-01-01 NOTE — Progress Notes (Signed)
° °  Covid-19 Vaccination Clinic  Name:  Brandi Chase    MRN: [id number] DOB: 1965/09/09  01/01/2020  [name] was observed post Covid-19 immunization for 30 minutes based on pre-vaccination screening without incident. She was provided with Vaccine Information Sheet and instruction to access the V-Safe system.   [name] was instructed to call 911 with any severe reactions post vaccine:  Difficulty breathing   Swelling of face and throat   A fast heartbeat   A bad rash all over body   Dizziness and weakness   Immunizations Administered    Name Date Dose VIS Date Route   Pfizer COVID-19 Vaccine 01/01/2020 12:48 PM 0.3 mL 06/06/2018 Intramuscular   Manufacturer: [place]   Lot: [id number]   [place]: [id number]

## 2020-01-22 ENCOUNTER — Ambulatory Visit: Payer: PPO | Attending: Internal Medicine

## 2020-01-22 DIAGNOSIS — Z23 Encounter for immunization: Secondary | ICD-10-CM

## 2020-01-22 NOTE — Progress Notes (Signed)
   Covid-19 Vaccination Clinic  Name:  Brandi Chase    MRN: [id number] DOB: 11-24-1965  01/22/2020  [name] was observed post Covid-19 immunization for 15 minutes without incident. She was provided with Vaccine Information Sheet and instruction to access the V-Safe system.   [name] was instructed to call 911 with any severe reactions post vaccine: Marland Kitchen Difficulty breathing  . Swelling of face and throat  . A fast heartbeat  . A bad rash all over body  . Dizziness and weakness   Immunizations Administered    Name Date Dose VIS Date Route   Pfizer COVID-19 Vaccine 01/22/2020  1:01 PM 0.3 mL 06/06/2018 Intramuscular   Manufacturer: [employer]   Lot: [id number]   [place]: [id number]

## 2020-01-30 DIAGNOSIS — Z124 Encounter for screening for malignant neoplasm of cervix: Secondary | ICD-10-CM | POA: Diagnosis not present

## 2020-01-30 DIAGNOSIS — Z6841 Body Mass Index (BMI) 40.0 and over, adult: Secondary | ICD-10-CM | POA: Diagnosis not present

## 2020-01-30 DIAGNOSIS — N915 Oligomenorrhea, unspecified: Secondary | ICD-10-CM | POA: Diagnosis not present

## 2020-01-30 DIAGNOSIS — Z01419 Encounter for gynecological examination (general) (routine) without abnormal findings: Secondary | ICD-10-CM | POA: Diagnosis not present

## 2020-01-30 DIAGNOSIS — Z113 Encounter for screening for infections with a predominantly sexual mode of transmission: Secondary | ICD-10-CM | POA: Diagnosis not present

## 2020-01-31 DIAGNOSIS — Z124 Encounter for screening for malignant neoplasm of cervix: Secondary | ICD-10-CM | POA: Diagnosis not present

## 2020-08-12 ENCOUNTER — Telehealth: Payer: Self-pay

## 2020-08-12 NOTE — Telephone Encounter (Signed)
Pt is needing a call back she called to be fit in for tomorrow when offered tomorrow with Dr Alfonse Spruce at [phone] and ask the reason for appt  She stated she  Has not been feeling well she stated that she has be Congested ,  Had a fever for a few days and body ache .. pt stated she has not been tested for covid in the past 14 days

## 2020-08-12 NOTE — Telephone Encounter (Signed)
Return pt's call. Stated she has not been feeling well since last Thursday when she had a fever of 100.6 and body aches/joint pain. So she has been taking Ibuprofen. Over the w/e her temp was 98.7.c/o sore throat and a dry cough. c/o slight sob esp over the past w/e; c/o slight change in smell and taste. She has been vaccinated for covid.  Agreeable for a telehealth appt for tomorrow with Dr Alfonse Spruce @ 1545 PM. Then she mentioned she has 2 or 3 covid home tests - informed pt to do a test also; stated she will.

## 2020-08-13 ENCOUNTER — Other Ambulatory Visit: Payer: Self-pay

## 2020-08-13 ENCOUNTER — Ambulatory Visit (INDEPENDENT_AMBULATORY_CARE_PROVIDER_SITE_OTHER): Payer: PPO | Admitting: Student

## 2020-08-13 DIAGNOSIS — U071 COVID-19: Secondary | ICD-10-CM | POA: Diagnosis not present

## 2020-08-13 HISTORY — DX: COVID-19: U07.1

## 2020-08-13 NOTE — Assessment & Plan Note (Signed)
Patient reports 1 week of fever (100.6), generalized body ache, sore throat, dry cough, mild shortness of breath.  She denies nausea/vomiting or diarrhea.  States that she is tolerated p.o. intake well.  She reports an episode of stinging feeling in her chest but could not describe it in detail the symptom.  Denies associated shortness of breath with that episode.  States that this sensation has resolved.  Patient is currently taking ibuprofen for fever and body aches.  States that her grandson was sick last week and did not test for COVID.  She has received 2 COVID vaccines last October.  Patient said that she did use a COVID test at home and the result came back with 2 lines.  States that she will schedule an appointment with [facility] to recheck.  Assessment and plan Her symptoms and positive test at home confirms COVID-19 infection.  Patient is outside of the window to use Paxlovid.  Since she just had the vaccination recently, remdesivir will not offer much benefit.  Advised patient to continue ibuprofen for symptomatic treatment and let the viral infection run the course.    Advised patient to visit the ED if worsening shortness of breath, chest pain or inability to tolerate p.o.  Patient verbalizes understanding.

## 2020-08-13 NOTE — Progress Notes (Signed)
  [facility] Internal Medicine Residency Telephone Encounter Continuity Care Appointment  HPI:   This telephone encounter was created for [name] on 08/13/2020 for the following purpose/cc Coivd symptoms.  Patient reports 1 week of fever (100.6), generalized body ache, sore throat, dry cough, mild shortness of breath.  She denies nausea/vomiting or diarrhea.  States that she is tolerated p.o. intake well.  She reports an episode of stinging feeling in her chest but could not describe it in detail the symptom.  Denies associated shortness of breath with that episode.  States that this sensation has resolved.  States that her grandson was sick last week and did not test for COVID.  She has received 2 COVID vaccines last October.  Patient said that she did use a COVID test at home and the result came back with 2 lines.  States that she will schedule an appointment with [facility] to recheck.   Past Medical History:  Past Medical History:  Diagnosis Date  . Adnexal cyst 2011   removed  . Anemia   . Arthritis    "neck; spine" (08/29/2014)  . Asthma   . Carpal tunnel syndrome of left wrist   . Chronic lower back pain   . Complication of anesthesia    difficult to awake  . Daily headache 12/2013   "tx'd and no problems anymore" (08/29/2014)  . Dyspnea    with asthma attack- none recent  . Family history of adverse reaction to anesthesia    "1 sister and both parents hard to wake up"  . Heart murmur    "dx'd when I was a teenager; haven't had any problems w/it"  . Hip pain, right   . History of bronchitis   . Hypertension   . Urinary incontinence   . Urinary urgency   . UTI (lower urinary tract infection)       ROS:  Review of Systems  Constitutional: Positive for fever.  Respiratory: Positive for cough and shortness of breath.   Gastrointestinal: Negative for diarrhea, nausea and vomiting.  Musculoskeletal: Positive for myalgias.     Assessment / Plan /  Recommendations:   Please see A&P under problem oriented charting for assessment of the patient's acute and chronic medical conditions.   As always, pt is advised that if symptoms worsen or new symptoms arise, they should go to an urgent care facility or to to ER for further evaluation.   Consent and Medical Decision Making:   Patient discussed with Dr. Jimmye Norman  This is a telephone encounter between [place] and Gaylan Gerold on 08/13/2020 for Covid 19 infection. The visit was conducted with the patient located at home and Gaylan Gerold at [facility]. The patient's identity was confirmed using their DOB and current address. The patient has consented to being evaluated through a telephone encounter and understands the associated risks (an examination cannot be done and the patient may need to come in for an appointment) / benefits (allows the patient to remain at home, decreasing exposure to coronavirus). I personally spent 8 minutes on medical discussion.

## 2020-08-14 ENCOUNTER — Encounter (HOSPITAL_COMMUNITY): Payer: Self-pay

## 2020-08-14 ENCOUNTER — Other Ambulatory Visit: Payer: Self-pay

## 2020-08-14 ENCOUNTER — Ambulatory Visit (HOSPITAL_COMMUNITY)
Admission: EM | Admit: 2020-08-14 | Discharge: 2020-08-14 | Disposition: A | Discharge status: Home / Self Care | Payer: PPO | Attending: Internal Medicine | Admitting: Internal Medicine

## 2020-08-14 DIAGNOSIS — U071 COVID-19: Secondary | ICD-10-CM

## 2020-08-14 MED ORDER — BENZONATATE 100 MG PO CAPS: 100.0000 mg | ORAL_CAPSULE | Freq: Three times a day (TID) | ORAL | 0 refills | Status: DC | PRN

## 2020-08-14 NOTE — Discharge Instructions (Addendum)
As we discussed, you are out of the treatment window for any of the antiviral medication used for COVID-19.  I have prescribed you Tessalon Perles as needed for cough.  Take over the counter Zyrtec and Flonase to help with congestion and sore throat.  In the meantime you should... Get plenty of rest and fluids Tessalon Perles prescribed for cough Flonase for nasal congestion and/or runny nose You can take OTC Zyrtec-D for nasal congestion, runny nose, and/or sore throat Use these medications as directed for symptom relief Use Tylenol or Ibuprofen as needed for fever or pain Return or go to the ER for any worsening or new symptoms such as high fever, worsening cough, shortness of breath, chest tightness, chest pain, changes in mental status, etc.

## 2020-08-14 NOTE — ED Provider Notes (Signed)
[place]    CSN: [id number] Arrival date & time: 08/14/20  1156      History   Chief Complaint Chief Complaint  Patient presents with  . Sore Throat  . Shortness of Breath  . Chest Congestion    HPI Brandi Chase is a 55 y.o. female presents today with complaints of sore throat and chest congestion.  Patient reports approximately 9-day history of generalized body aches, sore throat, dry cough and mild SOB.  Home test was positive for COVID.  Initially had fever but no recurrent fever in 4 days.  Patient states she was taking care of her grandson with similar symptoms just prior to symptoms starting.  Again fever has resolved, no headache or dizziness, chest pain, palpitations, abdominal pain, N/V/D.  Patient does endorse some mild shortness of breath mostly when moving around.    Past Medical History:  Diagnosis Date  . Adnexal cyst 2011   removed  . Anemia   . Arthritis    "neck; spine" (08/29/2014)  . Asthma   . Carpal tunnel syndrome of left wrist   . Chronic lower back pain   . Complication of anesthesia    difficult to awake  . Daily headache 12/2013   "tx'd and no problems anymore" (08/29/2014)  . Dyspnea    with asthma attack- none recent  . Family history of adverse reaction to anesthesia    "1 sister and both parents hard to wake up"  . Heart murmur    "dx'd when I was a teenager; haven't had any problems w/it"  . Hip pain, right   . History of bronchitis   . Hypertension   . Urinary incontinence   . Urinary urgency   . UTI (lower urinary tract infection)     Patient Active Problem List   Diagnosis Date Noted  . COVID-19 virus infection 08/13/2020  . Unprotected sex 12/13/2019  . Encounter for health maintenance examination 12/13/2019  . Pre-diabetes 12/13/2019  . Ruptured cyst of ovary 10/26/2019  . 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 External hemorrhoid 09/13/2018  . Atypical chest pain 06/14/2018  . Restless leg syndrome  03/29/2018  . Iron deficiency anemia 03/29/2018  . Left hand paresthesia 09/25/2017  . 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 DUB (dysfunctional uterine bleeding) 08/15/2015  . Uterus, adenomyosis 07/25/2015  . 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 Essential hypertension 04/17/2015  . Encounter for screening mammogram for malignant neoplasm of breast 04/17/2015  . H/O pelvic mass 05/24/2013  . Allergic rhinitis 12/11/2010  . Asthma 09/12/2008    Past Surgical History:  Procedure Laterality Date  . BACK SURGERY     x2  . JOINT REPLACEMENT Right 11/2015   hip  . OVARIAN CYST REMOVAL Right 2011  . REDUCTION MAMMAPLASTY Bilateral 1995  . 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  . 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   Procedure: TOTAL HIP ARTHROPLASTY ANTERIOR APPROACH;  Surgeon: Frederik Pear, MD;  Location: [facility];  Service: Orthopedics;  Laterality: Right;  . TOTAL HIP ARTHROPLASTY Left 07/19/2016   Procedure: TOTAL HIP ARTHROPLASTY ANTERIOR APPROACH;  Surgeon: Frederik Pear, MD;  Location: [facility];  Service: Orthopedics;  Laterality: Left;  . TRANSFORAMINAL LUMBAR INTERBODY FUSION (TLIF) WITH PEDICLE SCREW FIXATION 2 LEVEL N/A 08/29/2014   Procedure: TRANSFORAMINAL LUMBAR INTERBODY FUSION (TLIF) L4-S1;  Surgeon: Melina Schools, MD;  Location: [facility];  Service: Orthopedics;  Laterality: N/A;  . TUBAL LIGATION  1992    OB History  Gravida  3   Para  3   Term      Preterm      AB      Living  3     SAB      IAB      Ectopic      Multiple      Live Births               Home Medications    Prior to Admission medications   Medication Sig Start Date End Date Taking? Authorizing Provider  benzonatate (TESSALON PERLES) 100 MG capsule Take 1 capsule (100 mg total) by mouth 3 (three) times daily as needed for cough. 08/14/20 08/14/21 Yes Rudolpho Sevin, NP  albuterol (PROVENTIL HFA;VENTOLIN HFA) 108 (90 Base) MCG/ACT inhaler Inhale 2 puffs into the lungs  every 6 (six) hours as needed for wheezing or shortness of breath. Patient not taking: Reported on 09/28/2019 11/13/15   Kalman Shan Ratliff, DO  calcium carbonate (TUMS - DOSED IN MG ELEMENTAL CALCIUM) 500 MG chewable tablet Chew 1 tablet by mouth as needed for indigestion or heartburn. Patient not taking: Reported on 09/28/2019    [provider]  cephALEXin (KEFLEX) 500 MG capsule Take 1 capsule (500 mg total) by mouth 3 (three) times daily. 10/02/19   Chancy Milroy, MD  ferrous sulfate 325 (65 FE) MG tablet Take 1 tablet (325 mg total) by mouth daily with breakfast. Patient not taking: Reported on 09/28/2019 03/29/18   Kalman Shan Ratliff, DO  losartan (COZAAR) 50 MG tablet Take 1 tablet (50 mg total) by mouth daily. 12/13/19 01/12/20  Sanjuan Dame, MD  Magnesium 400 MG CAPS Take 1 tablet by mouth daily as needed. Patient not taking: Reported on 09/28/2019    [provider]  metroNIDAZOLE (FLAGYL) 500 MG tablet Take 1 tablet (500 mg total) by mouth 2 (two) times daily. 10/02/19   Chancy Milroy, MD  nitrofurantoin, macrocrystal-monohydrate, (MACROBID) 100 MG capsule Take 1 capsule (100 mg total) by mouth 2 (two) times daily. 10/03/19   Sloan Leiter, MD  terconazole (TERAZOL 7) 0.4 % vaginal cream Place 1 applicator vaginally at bedtime. Wait to use until after completing antibiotic 10/02/19   Chancy Milroy, MD    Family History Family History  Problem Relation Age of Onset  . Pancreatic cancer Mother   . Cancer Mother   . Hypertension Mother   . Arthritis Mother   . Prostate cancer Father   . Cancer Father        Prostate  . Hypertension Father   . Asthma Father   . Allergic rhinitis Father   . Arthritis Sister   . Scoliosis Sister   . Sleep apnea Daughter   . Narcolepsy Daughter   . Asthma Daughter   . Colon cancer Neg Hx   . Liver cancer Neg Hx   . Stomach cancer Neg Hx   . Rectal cancer Neg Hx   . Esophageal cancer Neg Hx     Social  History Social History   Tobacco Use  . Smoking status: Never Smoker  . Smokeless tobacco: Never Used  Vaping Use  . Vaping Use: Never used  Substance Use Topics  . Alcohol use: No  . Drug use: No     Allergies   Other, Primatene mist [epinephrine], Shrimp [shellfish allergy], Aleve [naproxen sodium], Bactrim ds [sulfamethoxazole w/trimethoprim (co-trimoxazole)], Midol [ibuprofen], Avocado, Cocoa butter, Diflucan [fluconazole], and Latex   Review of Systems As stated  in HPI otherwise negative   Physical Exam Triage Vital Signs ED Triage Vitals  Enc Vitals Group     BP 08/14/20 1254 135/80     Pulse Rate 08/14/20 1253 75     Resp 08/14/20 1253 17     Temp 08/14/20 1253 98.1 F (36.7 C)     Temp Source 08/14/20 1253 Oral     SpO2 08/14/20 1253 96 %     Weight --      Height --      Head Circumference --      Peak Flow --      Pain Score 08/14/20 1250 0     Pain Loc --      Pain Edu? --      Excl. in [place]? --    No data found.  Updated Vital Signs BP 135/80 (BP Location: Right Arm)   Pulse 75   Temp 98.1 F (36.7 C) (Oral)   Resp 17   LMP 08/17/2019 (Exact Date)   SpO2 96%   Visual Acuity Right Eye Distance:   Left Eye Distance:   Bilateral Distance:    Right Eye Near:   Left Eye Near:    Bilateral Near:     Physical Exam Constitutional:      General: She is not in acute distress.    Appearance: She is well-developed. She is not ill-appearing or toxic-appearing.  HENT:     Right Ear: Tympanic membrane normal.     Left Ear: Tympanic membrane normal.     Nose: Congestion present. No rhinorrhea.     Mouth/Throat:     Mouth: Mucous membranes are moist.     Pharynx: Oropharynx is clear. No oropharyngeal exudate.     Tonsils: No tonsillar exudate.  Cardiovascular:     Rate and Rhythm: Normal rate and regular rhythm.     Heart sounds: Normal heart sounds.  Pulmonary:     Effort: Pulmonary effort is normal.     Breath sounds: Normal breath sounds. No  wheezing, rhonchi or rales.  Abdominal:     General: Bowel sounds are normal.     Palpations: Abdomen is soft.  Musculoskeletal:     Cervical back: Normal range of motion and neck supple.  Lymphadenopathy:     Cervical: No cervical adenopathy.  Skin:    General: Skin is warm and dry.  Neurological:     General: No focal deficit present.     Mental Status: She is alert and oriented to person, place, and time.  Psychiatric:        Mood and Affect: Mood normal.        Behavior: Behavior normal.      UC Treatments / Results  Labs (all labs ordered are listed, but only abnormal results are displayed) Labs Reviewed - No data to display  EKG   Radiology No results found.  Procedures Procedures (including critical care time)  Medications Ordered in UC Medications - No data to display  Initial Impression / Assessment and Plan / UC Course  I have reviewed the triage vital signs and the nursing notes.  Pertinent labs & imaging results that were available during my care of the patient were reviewed by me and considered in my medical decision making (see chart for details).  Covid-19 infection -Positive at home test with symptoms beginning approximately 9 days ago. -VSS, nontoxic appearing -Discussed progression of virus.  Unfortunately patient is out of the treatment window for antiviral medication -Treat symptomatically  with as needed Flonase, Zyrtec and Tessalon Perles -Follow-up for any worsening or persistent symptoms  Reviewed expections re: course of current medical issues. Questions answered. Outlined signs and symptoms indicating need for more acute intervention. Pt verbalized understanding. AVS given  Final Clinical Impressions(s) / UC Diagnoses   Final diagnoses:  COVID-19     Discharge Instructions     As we discussed, you are out of the treatment window for any of the antiviral medication used for COVID-19.  I have prescribed you Tessalon Perles as  needed for cough.  Take over the counter Zyrtec and Flonase to help with congestion and sore throat.  In the meantime you should... . Get plenty of rest and fluids . Tessalon Perles prescribed for cough . Flonase for nasal congestion and/or runny nose . You can take OTC Zyrtec-D for nasal congestion, runny nose, and/or sore throat . Use these medications as directed for symptom relief . Use Tylenol or Ibuprofen as needed for fever or pain . Return or go to the ER for any worsening or new symptoms such as high fever, worsening cough, shortness of breath, chest tightness, chest pain, changes in mental status, etc.     ED Prescriptions    Medication Sig Dispense Auth. Provider   benzonatate (TESSALON PERLES) 100 MG capsule Take 1 capsule (100 mg total) by mouth 3 (three) times daily as needed for cough. 30 capsule Rudolpho Sevin, NP     PDMP not reviewed this encounter.   Rudolpho Sevin, NP 08/14/20 1555

## 2020-08-14 NOTE — ED Triage Notes (Signed)
Pt c/o chest congestion, sore throat, SOB x 9 days. Pt states she has take ibuprofen and states her body aches have improved.

## 2020-08-18 ENCOUNTER — Telehealth: Payer: Self-pay | Admitting: Student

## 2020-08-18 NOTE — Telephone Encounter (Signed)
Thank you Stacee, I agree with your assessment and plan for this patient.   [place]  Internal Medicine Resident PGY-1 [facility]  Pager: [phone]

## 2020-08-18 NOTE — Telephone Encounter (Signed)
Pt calling back to report her COVID TEST readings.  Pls call back.

## 2020-08-18 NOTE — Telephone Encounter (Signed)
Brandi Chase, patient had Telehealth visit on 08/13/20 w/ Dr. Alfonse Spruce where she reported a positive Covid home test and reported she had been symptomatic X 1 week.  Pt is calling today to report she took another Covid home test and it came back negative. She is still coughing, but is taking Tessalon pearls which were prescribed from urgent care.  She states she still gets "a little winded when moving around".  She denies any SOB at rest.  RN informed patient her symptoms are normal and it can take a while to resolve.  She was instructed if she develops any fever, chest tightness, SOB at rest, or worsening symptoms to call back, she verbalized understanding. SChaplin, RN,BSN

## 2020-09-10 NOTE — Progress Notes (Signed)
Internal Medicine Clinic Attending  Case discussed with Dr. Alfonse Spruce  At the time of the visit.  We reviewed the resident's history  and pertinent patient test results.  I agree with the assessment, diagnosis, and plan of care documented in the resident's note.

## 2020-09-19 ENCOUNTER — Other Ambulatory Visit: Payer: Self-pay | Admitting: Student

## 2020-09-19 DIAGNOSIS — Z1231 Encounter for screening mammogram for malignant neoplasm of breast: Secondary | ICD-10-CM

## 2020-10-14 ENCOUNTER — Encounter: Payer: Self-pay | Admitting: *Deleted

## 2020-10-22 ENCOUNTER — Telehealth: Payer: Self-pay

## 2020-10-22 ENCOUNTER — Ambulatory Visit
Admission: EM | Admit: 2020-10-22 | Discharge: 2020-10-22 | Disposition: A | Discharge status: Home / Self Care | Payer: PPO | Attending: Emergency Medicine | Admitting: Emergency Medicine

## 2020-10-22 ENCOUNTER — Encounter: Payer: Self-pay | Admitting: Emergency Medicine

## 2020-10-22 ENCOUNTER — Other Ambulatory Visit: Payer: Self-pay

## 2020-10-22 DIAGNOSIS — T63481A Toxic effect of venom of other arthropod, accidental (unintentional), initial encounter: Secondary | ICD-10-CM | POA: Diagnosis not present

## 2020-10-22 DIAGNOSIS — R21 Rash and other nonspecific skin eruption: Secondary | ICD-10-CM

## 2020-10-22 MED ORDER — PREDNISONE 10 MG PO TABS: ORAL_TABLET | ORAL | 0 refills | Status: DC

## 2020-10-22 MED ORDER — TRIAMCINOLONE ACETONIDE 0.1 % EX CREA: 1.0000 "application " | TOPICAL_CREAM | Freq: Two times a day (BID) | CUTANEOUS | 0 refills | Status: DC

## 2020-10-22 MED ORDER — CETIRIZINE HCL 10 MG PO CAPS: 10.0000 mg | ORAL_CAPSULE | Freq: Every day | ORAL | 0 refills | Status: DC

## 2020-10-22 NOTE — Discharge Instructions (Addendum)
Please use triamcinolone cream twice daily to area to help with localized inflammation/itching Please take daily cetirizine/Zyrtec in the morning, supplement Benadryl in the evening May fill prescription for prednisone course if developing any diffuse rash, facial swelling Please return here emergency room if developing any difficulty breathing shortness of breath, oral swelling, persistent symptoms

## 2020-10-22 NOTE — Telephone Encounter (Signed)
Received phone call from front desk, Hme, that patient is on the phone regarding an appt.  Hme states patient called earlier this morning for an appt today d/t a bee sting, no appt's were available.  Hme states she tried calling triage, but triage was busy and did not pick up.  Hme told the patient again that no appt's were available, made her an appt for 10/29/20 and instructed the patient to go to urgent care if she wanted to be seen today.  The patient is now calling again and is transferred to triage.  She states "I was stung by a bee this morning and I've been a patient there for 20 years and do you mean to tell me y'all couldn't get me in today".  RN explained to patient that as her [facility] provider, our schedule will sometimes allow [place] to make same day appt's, but this is not always the case.  Explained to patient that if all appt's are full, patients are sometimes instructed to seek care at the ED or urgent care center if a patient cannot wait until the next available appt.  Patient denies any allergies to bees.  Patient becomes frustrated over the phone and starts yelling that "Nobody even called me to check on me and I could have been laying here dead on my floor"!  RN asked patient if she wants to speak to the office manager and she states no, she has an appt on 7/20 and will bring it up then. Patient politely ends conversation. SChaplin, RN,BSN

## 2020-10-22 NOTE — ED Triage Notes (Signed)
Patient presents to [facility] for assessment after she was walking in the park less than an hour ago and was stung by something (possible bee) on her right mid back.  Area is red, pain is improving.

## 2020-10-22 NOTE — ED Provider Notes (Signed)
[facility]    CSN: [id number] Arrival date & time: 10/22/20  1144      History   Chief Complaint Chief Complaint  Patient presents with   Insect Bite    HPI Brandi Chase is a 55 y.o. female history of asthma, allergic rhinitis, hypertension, presenting today for evaluation of a bee sting.  Reports approximately 30 to 40 minutes ago she was walking in the park and felt a stinging sensation to her back.  Since has had discomfort around the area.  Denies other rashes.  Denies any facial lip or oral swelling.  Denies difficulty breathing or shortness of breath.  Denies any history of difficulty breathing with associated sting/bites.  HPI  Past Medical History:  Diagnosis Date   Adnexal cyst 2011   removed   Anemia    Arthritis    "neck; spine" (08/29/2014)   Asthma    Carpal tunnel syndrome of left wrist    Chronic lower back pain    Complication of anesthesia    difficult to awake   Daily headache 12/2013   "tx'd and no problems anymore" (08/29/2014)   Dyspnea    with asthma attack- none recent   Family history of adverse reaction to anesthesia    "1 sister and both parents hard to wake up"   Heart murmur    "dx'd when I was a teenager; haven't had any problems w/it"   Hip pain, right    History of bronchitis    Hypertension    Urinary incontinence    Urinary urgency    UTI (lower urinary tract infection)     Patient Active Problem List   Diagnosis Date Noted   COVID-19 virus infection 08/13/2020   Unprotected sex 12/13/2019   Encounter for health maintenance examination 12/13/2019   Pre-diabetes 12/13/2019   Ruptured cyst of ovary 10/26/2019   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, left 03/15/2019   External hemorrhoid 09/13/2018   Atypical chest pain 06/14/2018   Restless leg syndrome 03/29/2018   Iron deficiency anemia 03/29/2018   Left hand paresthesia 09/25/2017   Status post total hip replacement, 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   DUB (dysfunctional uterine bleeding)  08/15/2015   Uterus, adenomyosis 07/25/2015   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   Essential hypertension 04/17/2015   Encounter for screening mammogram for malignant neoplasm of breast 04/17/2015   H/O pelvic mass 05/24/2013   Allergic rhinitis 12/11/2010   Asthma 09/12/2008    Past Surgical History:  Procedure Laterality Date   BACK SURGERY     x2   JOINT REPLACEMENT Right 11/2015   hip   OVARIAN CYST REMOVAL Right 2011   REDUCTION MAMMAPLASTY Bilateral 1995   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   Procedure: TOTAL HIP ARTHROPLASTY ANTERIOR APPROACH;  Surgeon: Frederik Pear, MD;  Location: [facility];  Service: Orthopedics;  Laterality: Right;   TOTAL HIP ARTHROPLASTY Left 07/19/2016   Procedure: TOTAL HIP ARTHROPLASTY ANTERIOR APPROACH;  Surgeon: Frederik Pear, MD;  Location: [facility];  Service: Orthopedics;  Laterality: Left;   TRANSFORAMINAL LUMBAR INTERBODY FUSION (TLIF) WITH PEDICLE SCREW FIXATION 2 LEVEL N/A 08/29/2014   Procedure: TRANSFORAMINAL LUMBAR INTERBODY FUSION (TLIF) L4-S1;  Surgeon: Melina Schools, MD;  Location: [facility];  Service: Orthopedics;  Laterality: N/A;   TUBAL LIGATION  1992    OB History     Gravida  3   Para  3   Term      Preterm  AB      Living  3      SAB      IAB      Ectopic      Multiple      Live Births               Home Medications    Prior to Admission medications   Medication Sig Start Date End Date Taking? Authorizing Provider  Cetirizine HCl 10 MG CAPS Take 1 capsule (10 mg total) by mouth daily for 10 days. 10/22/20 11/01/20 Yes Branden Shallenberger C, PA-C  predniSONE (DELTASONE) 10 MG tablet Begin with 6 tabs on day 1, 5 tab on day 2, 4 tab on day 3, 3 tab on day 4, 2 tab on day 5, 1 tab on day 6-take with food Begin only if developing diffuse rash/any facial swelling 10/22/20  Yes Danyelle Brookover C, PA-C  triamcinolone cream (KENALOG) 0.1 % Apply 1 application topically 2 (two) times  daily. 10/22/20  Yes Chisum Habenicht C, PA-C  losartan (COZAAR) 50 MG tablet Take 1 tablet (50 mg total) by mouth daily. 12/13/19 01/12/20  Sanjuan Dame, MD  metroNIDAZOLE (FLAGYL) 500 MG tablet Take 1 tablet (500 mg total) by mouth 2 (two) times daily. 10/02/19   Chancy Milroy, MD  nitrofurantoin, macrocrystal-monohydrate, (MACROBID) 100 MG capsule Take 1 capsule (100 mg total) by mouth 2 (two) times daily. 10/03/19   Sloan Leiter, MD  terconazole (TERAZOL 7) 0.4 % vaginal cream Place 1 applicator vaginally at bedtime. Wait to use until after completing antibiotic 10/02/19   Chancy Milroy, MD    Family History Family History  Problem Relation Age of Onset   Pancreatic cancer Mother    Cancer Mother    Hypertension Mother    Arthritis Mother    Prostate cancer Father    Cancer Father        Prostate   Hypertension Father    Asthma Father    Allergic rhinitis Father    Arthritis Sister    Scoliosis Sister    Sleep apnea Daughter    Narcolepsy Daughter    Asthma Daughter    Colon cancer Neg Hx    Liver cancer Neg Hx    Stomach cancer Neg Hx    Rectal cancer Neg Hx    Esophageal cancer Neg Hx     Social History Social History   Tobacco Use   Smoking status: Never   Smokeless tobacco: Never  Vaping Use   Vaping Use: Never used  Substance Use Topics   Alcohol use: No   Drug use: No     Allergies   Other, Primatene mist [epinephrine], Shrimp [shellfish allergy], Aleve [naproxen sodium], Bactrim ds [sulfamethoxazole w/trimethoprim (co-trimoxazole)], Midol [ibuprofen], Avocado, Cocoa butter, Diflucan [fluconazole], and Latex   Review of Systems Review of Systems  Constitutional:  Negative for fatigue and fever.  Eyes:  Negative for visual disturbance.  Respiratory:  Negative for shortness of breath.   Cardiovascular:  Negative for chest pain.  Gastrointestinal:  Negative for abdominal pain, nausea and vomiting.  Musculoskeletal:  Negative for arthralgias and  joint swelling.  Skin:  Positive for color change and rash. Negative for wound.  Neurological:  Negative for dizziness, weakness, light-headedness and headaches.    Physical Exam Triage Vital Signs ED Triage Vitals  Enc Vitals Group     BP      Pulse      Resp      Temp  Temp src      SpO2      Weight      Height      Head Circumference      Peak Flow      Pain Score      Pain Loc      Pain Edu?      Excl. in [place]?    No data found.  Updated Vital Signs BP (!) 129/96 (BP Location: Right Arm)   Pulse 89   Temp 98 F (36.7 C) (Oral)   Resp 18   LMP 08/17/2019 (Exact Date)   SpO2 92%   Visual Acuity Right Eye Distance:   Left Eye Distance:   Bilateral Distance:    Right Eye Near:   Left Eye Near:    Bilateral Near:     Physical Exam Vitals and nursing note reviewed.  Constitutional:      Appearance: She is well-developed.     Comments: No acute distress  HENT:     Head: Normocephalic and atraumatic.     Nose: Nose normal.     Mouth/Throat:     Comments: Oral mucosa pink and moist, no tonsillar enlargement or exudate. Posterior pharynx patent and nonerythematous, no uvula deviation or swelling. Normal phonation.  Eyes:     Conjunctiva/sclera: Conjunctivae normal.  Cardiovascular:     Rate and Rhythm: Normal rate.  Pulmonary:     Effort: Pulmonary effort is normal. No respiratory distress.     Comments: Breathing comfortably at rest, CTABL, no wheezing, rales or other adventitious sounds auscultated  Abdominal:     General: There is no distension.  Musculoskeletal:        General: Normal range of motion.     Cervical back: Neck supple.  Skin:    General: Skin is warm and dry.     Comments: Left lower back with mild swelling, no other rash noted  Neurological:     Mental Status: She is alert and oriented to person, place, and time.     UC Treatments / Results  Labs (all labs ordered are listed, but only abnormal results are displayed) Labs  Reviewed - No data to display  EKG   Radiology No results found.  Procedures Procedures (including critical care time)  Medications Ordered in UC Medications - No data to display  Initial Impression / Assessment and Plan / UC Course  I have reviewed the triage vital signs and the nursing notes.  Pertinent labs & imaging results that were available during my care of the patient were reviewed by me and considered in my medical decision making (see chart for details).     Insect bite/sting-seems to be localized area of bite at this point until the incident did occur in the past hour.  Recommending triamcinolone topically and oral antihistamines.  Deferring oral steroids, provided prescription for patient to fill if developing more diffuse rash/hives.  Advised to return here emergency room developing any difficulty breathing.  Discussed strict return precautions. Patient verbalized understanding and is agreeable with plan.  Final Clinical Impressions(s) / UC Diagnoses   Final diagnoses:  Sting, insect, accidental or unintentional, initial encounter  Rash of back     Discharge Instructions      Please use triamcinolone cream twice daily to area to help with localized inflammation/itching Please take daily cetirizine/Zyrtec in the morning, supplement Benadryl in the evening May fill prescription for prednisone course if developing any diffuse rash, facial swelling Please return here emergency room  if developing any difficulty breathing shortness of breath, oral swelling, persistent symptoms     ED Prescriptions     Medication Sig Dispense Auth. Provider   triamcinolone cream (KENALOG) 0.1 % Apply 1 application topically 2 (two) times daily. 30 g Herby Amick C, PA-C   Cetirizine HCl 10 MG CAPS Take 1 capsule (10 mg total) by mouth daily for 10 days. 10 capsule Alaena Strader C, PA-C   predniSONE (DELTASONE) 10 MG tablet Begin with 6 tabs on day 1, 5 tab on day 2, 4 tab  on day 3, 3 tab on day 4, 2 tab on day 5, 1 tab on day 6-take with food Begin only if developing diffuse rash/any facial swelling 21 tablet Jaeson Molstad C, PA-C      PDMP not reviewed this encounter.   Janith Lima, PA-C 10/22/20 1410

## 2020-10-29 ENCOUNTER — Encounter: Payer: PPO | Admitting: Student

## 2020-11-13 ENCOUNTER — Ambulatory Visit
Admission: RE | Admit: 2020-11-13 | Discharge: 2020-11-13 | Disposition: A | Discharge status: Home / Self Care | Payer: PPO | Source: Ambulatory Visit | Attending: Internal Medicine | Admitting: Internal Medicine

## 2020-11-13 ENCOUNTER — Other Ambulatory Visit: Payer: Self-pay

## 2020-11-13 DIAGNOSIS — Z1231 Encounter for screening mammogram for malignant neoplasm of breast: Secondary | ICD-10-CM | POA: Diagnosis not present

## 2020-12-08 ENCOUNTER — Encounter: Payer: Self-pay | Admitting: Internal Medicine

## 2020-12-08 ENCOUNTER — Ambulatory Visit (INDEPENDENT_AMBULATORY_CARE_PROVIDER_SITE_OTHER): Payer: PPO | Admitting: Internal Medicine

## 2020-12-08 ENCOUNTER — Other Ambulatory Visit: Payer: Self-pay

## 2020-12-08 VITALS — BP 142/90 | HR 78 | Temp 98.3°F | Ht 62.0 in | Wt 225.5 lb

## 2020-12-08 DIAGNOSIS — R350 Frequency of micturition: Secondary | ICD-10-CM | POA: Diagnosis not present

## 2020-12-08 DIAGNOSIS — E782 Mixed hyperlipidemia: Secondary | ICD-10-CM

## 2020-12-08 DIAGNOSIS — Z111 Encounter for screening for respiratory tuberculosis: Secondary | ICD-10-CM

## 2020-12-08 DIAGNOSIS — E785 Hyperlipidemia, unspecified: Secondary | ICD-10-CM | POA: Insufficient documentation

## 2020-12-08 DIAGNOSIS — R3589 Other polyuria: Secondary | ICD-10-CM | POA: Diagnosis not present

## 2020-12-08 DIAGNOSIS — I10 Essential (primary) hypertension: Secondary | ICD-10-CM

## 2020-12-08 DIAGNOSIS — Z0000 Encounter for general adult medical examination without abnormal findings: Secondary | ICD-10-CM | POA: Diagnosis not present

## 2020-12-08 DIAGNOSIS — R7303 Prediabetes: Secondary | ICD-10-CM

## 2020-12-08 DIAGNOSIS — E559 Vitamin D deficiency, unspecified: Secondary | ICD-10-CM | POA: Insufficient documentation

## 2020-12-08 LAB — GLUCOSE, CAPILLARY: Glucose-Capillary: 94 mg/dL (ref 70–99)

## 2020-12-08 LAB — POCT GLYCOSYLATED HEMOGLOBIN (HGB A1C): Hemoglobin A1C: 5.9 % — AB (ref 4.0–5.6)

## 2020-12-08 NOTE — Progress Notes (Signed)
CC: HTN  HPI:  Brandi Chase is a 55 y.o. female with a past medical history stated below and presents today for HTN. Please see problem based assessment and plan for additional details.  Past Medical History:  Diagnosis Date   Adnexal cyst 2011   removed   Anemia    Arthritis    "neck; spine" (08/29/2014)   Asthma    Carpal tunnel syndrome of left wrist    Chronic lower back pain    Complication of anesthesia    difficult to awake   Daily headache 12/2013   "tx'd and no problems anymore" (08/29/2014)   Dyspnea    with asthma attack- none recent   Family history of adverse reaction to anesthesia    "1 sister and both parents hard to wake up"   Heart murmur    "dx'd when I was a teenager; haven't had any problems w/it"   Hip pain, right    History of bronchitis    Hypertension    Urinary incontinence    Urinary urgency    UTI (lower urinary tract infection)     Current Outpatient Medications on File Prior to Visit  Medication Sig Dispense Refill   Cetirizine HCl 10 MG CAPS Take 1 capsule (10 mg total) by mouth daily for 10 days. 10 capsule 0   losartan (COZAAR) 50 MG tablet Take 1 tablet (50 mg total) by mouth daily. 30 tablet 0   metroNIDAZOLE (FLAGYL) 500 MG tablet Take 1 tablet (500 mg total) by mouth 2 (two) times daily. 14 tablet 0   nitrofurantoin, macrocrystal-monohydrate, (MACROBID) 100 MG capsule Take 1 capsule (100 mg total) by mouth 2 (two) times daily. 14 capsule 0   predniSONE (DELTASONE) 10 MG tablet Begin with 6 tabs on day 1, 5 tab on day 2, 4 tab on day 3, 3 tab on day 4, 2 tab on day 5, 1 tab on day 6-take with food Begin only if developing diffuse rash/any facial swelling 21 tablet 0   terconazole (TERAZOL 7) 0.4 % vaginal cream Place 1 applicator vaginally at bedtime. Wait to use until after completing antibiotic 45 g 0   triamcinolone cream (KENALOG) 0.1 % Apply 1 application topically 2 (two) times daily. 30 g 0   No current  facility-administered medications on file prior to visit.    Family History  Problem Relation Age of Onset   Pancreatic cancer Mother    Cancer Mother    Hypertension Mother    Arthritis Mother    Prostate cancer Father    Cancer Father        Prostate   Hypertension Father    Asthma Father    Allergic rhinitis Father    Arthritis Sister    Scoliosis Sister    Sleep apnea Daughter    Narcolepsy Daughter    Asthma Daughter    Colon cancer Neg Hx    Liver cancer Neg Hx    Stomach cancer Neg Hx    Rectal cancer Neg Hx    Esophageal cancer Neg Hx     Social History   Socioeconomic History   Marital status: Single    Spouse name: Not on file   Number of children: 3   Years of education: Not on file   Highest education level: Not on file  Occupational History   Occupation: Disabled   Occupation: CNA  Tobacco Use   Smoking status: Never   Smokeless tobacco: Never  Vaping Use  Vaping Use: Never used  Substance and Sexual Activity   Alcohol use: No   Drug use: No   Sexual activity: Yes    Birth control/protection: Surgical  Other Topics Concern   Not on file  Social History Narrative   Current Social History 01/03/2019        Patient lives with daughter and 69 yo grandson in a second floor apartment. There are 14 steps with handrails up to the entrance the patient uses.       Patient's method of transportation is personal car.      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      The patient currently disabled.      Identified important Relationships are "My brother's girlfriend, they've been together 20 years."       Pets : None       Interests / Fun: "Walking around [place] lake, reading"      Current Stressors: "Nothing really. I try not to let things stress me."       Religious / Personal Beliefs: "Christian"       L. Ducatte, RN, BSN       Social Determinants of Health   Financial Resource Strain: Not on file  Food  Insecurity: Not on file  Transportation Needs: Not on file  Physical Activity: Not on file  Stress: Not on file  Social Connections: Not on file  Intimate Partner Violence: Not on file    Review of Systems: ROS negative except for what is noted on the assessment and plan.  There were no vitals filed for this visit.  Physical Exam: Gen: A&O x3 and in no apparent distress, well appearing and nourished. HEENT: Head - normocephalic, atraumatic. Eye -  visual acuity grossly intact, conjunctiva clear, sclera non-icteric, EOM intact. Mouth - No obvious caries or periodontal disease. Neck: no obvious masses or nodules, AROM intact. CV: RRR, no murmurs, rubs, or gallops. S1/S2 presents  Resp: Clear to ascultation bilaterally  Abd: BS (+) x4, soft, non-tender, without obvious hepatosplenomegaly or masses MSK: Grossly normal AROM and strength x4 extremities. Skin: good skin turgor, no rashes, unusual bruising, or prominent lesions.  Neuro: No focal deficits, grossly normal sensation and coordination.  Psych: Oriented x3 and responding appropriately. Intact recent and remote memory, normal mood, judgement, affect , and insight.    Assessment & Plan:   See Encounters Tab for problem based charting.  Patient discussed with Dr. Lars Mage, D.O. [facility], PGY-3 Pager: [phone], Phone: [phone] Date 12/08/2020 Time 10:28 AM

## 2020-12-08 NOTE — Assessment & Plan Note (Addendum)
HPI: Patient presents for further evaluation and management of Vitamin D deficiency.  Patient previously had her vitamin D checked in 2020 and it was 18.2. She is perimenapausal and it has been 13 months since her last menses. She has not had a Dexa scan or fragility fracture.   Assessment/Plan: - Vit D level

## 2020-12-08 NOTE — Assessment & Plan Note (Addendum)
HPI: Patient presents for further evaluation and management of prediabetes. She is not currently on medications.  Hemoglobin A1C Latest Ref Rng & Units 12/13/2019  HGBA1C 4.8 - 5.6 % 5.9(H)  Some recent data might be hidden   Assessment/Plan: -Repeat A1c today.

## 2020-12-08 NOTE — Patient Instructions (Signed)
Thank you, Ms.Margo Common for allowing [place] to provide your care today. Today we discussed blood pressure, cholesterol, prediabetes.     Labs Ordered:  Lab Orders         Lipid Profile         BMP8+Anion Gap         Vitamin D (25 hydroxy)         Urinalysis, Reflex Microscopic         POC Hbg A1C      Tests Ordered:  none  Referrals Ordered:  Referral Orders  No referral(s) requested today     Medication Changes:   No orders of the defined types were placed in this encounter.    Health Maintenance Screening: There are no preventive care reminders to display for this patient.   Instructions:   Follow up: 6 months   Remember: If you have any questions or concerns, call our clinic at [phone] or after hours call [phone] and ask for the internal medicine resident on call.  Marianna Payment, D.O. [facility]

## 2020-12-08 NOTE — Assessment & Plan Note (Addendum)
HPI: Patient presents for further evaluation and management of Hyperlipidemia.  Patient previously had an elevated total cholesterol and HDL with an elevated 10-year ASCVD risk.  Patient opted for a trial off lifestyle modifications.  Is been a year since her last lipid panel.  Lipid Panel     Component Value Date/Time   CHOL 209 (H) 12/13/2019 1514   TRIG 81 12/13/2019 1514   HDL 53 12/13/2019 1514   CHOLHDL 3.9 12/13/2019 1514   CHOLHDL 3.3 Ratio 03/05/2009 1946   VLDL 16 03/05/2009 1946   LDLCALC 141 (H) 12/13/2019 1514   LABVLDL 15 12/13/2019 1514    Assessment/Plan: - Follow up lipid panel - Counseled regarding ASCVD risk.

## 2020-12-08 NOTE — Assessment & Plan Note (Addendum)
HPI: Patient presents for further evaluation and management of HTN. Patient's BP today is 142/90.  Not currently taking any medication.  She states that her blood pressure is not elevated at home but only periodically takes it.  Her previous blood pressures are listed below.  Recent blood pressures:  BP Readings from Last 3 Encounters:  10/22/20 (!) 129/96  08/14/20 135/80  12/13/19 (!) 153/87    Assessment/Plan: -Patient will likely need blood pressure medication in the near future

## 2020-12-09 LAB — URINALYSIS, ROUTINE W REFLEX MICROSCOPIC
Bilirubin, UA: NEGATIVE
Glucose, UA: NEGATIVE
Ketones, UA: NEGATIVE
Leukocytes,UA: NEGATIVE
Nitrite, UA: NEGATIVE
Protein,UA: NEGATIVE
RBC, UA: NEGATIVE
Specific Gravity, UA: 1.018 (ref 1.005–1.030)
Urobilinogen, Ur: 0.2 mg/dL (ref 0.2–1.0)
pH, UA: 5 (ref 5.0–7.5)

## 2020-12-12 LAB — BMP8+ANION GAP
Anion Gap: 14 mmol/L (ref 10.0–18.0)
BUN/Creatinine Ratio: 13 (ref 9–23)
BUN: 13 mg/dL (ref 6–24)
CO2: 22 mmol/L (ref 20–29)
Calcium: 9.8 mg/dL (ref 8.7–10.2)
Chloride: 105 mmol/L (ref 96–106)
Creatinine, Ser: 1.04 mg/dL — ABNORMAL HIGH (ref 0.57–1.00)
Glucose: 86 mg/dL (ref 65–99)
Potassium: 4.9 mmol/L (ref 3.5–5.2)
Sodium: 141 mmol/L (ref 134–144)
eGFR: 63 mL/min/{1.73_m2} (ref >59)

## 2020-12-12 LAB — LIPID PANEL
Chol/HDL Ratio: 3.6 ratio (ref 0.0–4.4)
Cholesterol, Total: 197 mg/dL (ref 100–199)
HDL: 55 mg/dL (ref >39)
LDL Chol Calc (NIH): 129 mg/dL — ABNORMAL HIGH (ref 0–99)
Triglycerides: 70 mg/dL (ref 0–149)
VLDL Cholesterol Cal: 13 mg/dL (ref 5–40)

## 2020-12-12 LAB — QUANTIFERON-TB GOLD PLUS
QuantiFERON Mitogen Value: >10 IU/mL
QuantiFERON Nil Value: 0.07 IU/mL
QuantiFERON TB1 Ag Value: 0.09 IU/mL
QuantiFERON TB2 Ag Value: 0.05 IU/mL
QuantiFERON-TB Gold Plus: NEGATIVE

## 2020-12-12 LAB — VITAMIN D 25 HYDROXY (VIT D DEFICIENCY, FRACTURES): Vit D, 25-Hydroxy: 33.2 ng/mL (ref 30.0–100.0)

## 2020-12-25 NOTE — Progress Notes (Signed)
Internal Medicine Clinic Attending  Case discussed with Dr. Coe  At the time of the visit.  We reviewed the resident's history and exam and pertinent patient test results.  I agree with the assessment, diagnosis, and plan of care documented in the resident's note.  

## 2021-02-24 DIAGNOSIS — Z202 Contact with and (suspected) exposure to infections with a predominantly sexual mode of transmission: Secondary | ICD-10-CM | POA: Diagnosis not present

## 2021-02-24 DIAGNOSIS — Z113 Encounter for screening for infections with a predominantly sexual mode of transmission: Secondary | ICD-10-CM | POA: Diagnosis not present

## 2021-02-24 DIAGNOSIS — Z01419 Encounter for gynecological examination (general) (routine) without abnormal findings: Secondary | ICD-10-CM | POA: Diagnosis not present

## 2021-03-30 ENCOUNTER — Other Ambulatory Visit: Payer: Self-pay

## 2021-03-30 ENCOUNTER — Encounter: Payer: Self-pay | Admitting: Internal Medicine

## 2021-03-30 ENCOUNTER — Ambulatory Visit (INDEPENDENT_AMBULATORY_CARE_PROVIDER_SITE_OTHER): Payer: PPO | Admitting: Internal Medicine

## 2021-03-30 ENCOUNTER — Other Ambulatory Visit (HOSPITAL_COMMUNITY)
Admission: RE | Admit: 2021-03-30 | Discharge: 2021-03-30 | Disposition: A | Discharge status: Home / Self Care | Payer: PPO | Source: Ambulatory Visit | Attending: Internal Medicine | Admitting: Internal Medicine

## 2021-03-30 VITALS — BP 143/87 | HR 74 | Temp 98.3°F | Ht 62.0 in | Wt 222.0 lb

## 2021-03-30 DIAGNOSIS — Z7251 High risk heterosexual behavior: Secondary | ICD-10-CM | POA: Insufficient documentation

## 2021-03-30 NOTE — Progress Notes (Signed)
° °  CC: Abnormal vaginal discharge  HPI:  [name] is a 55 y.o. female with past medical history stated below presents today with the complaint of abnormal vaginal discharge requesting STI testing.  Please see problem discharge in detail assessment and plan.  Past Medical History:  Diagnosis Date   Adnexal cyst 2011   removed   Anemia    Arthritis    "neck; spine" (08/29/2014)   Asthma    Carpal tunnel syndrome of left wrist    Chronic lower back pain    Complication of anesthesia    difficult to awake   Daily headache 12/2013   "tx'd and no problems anymore" (08/29/2014)   Dyspnea    with asthma attack- none recent   Family history of adverse reaction to anesthesia    "1 sister and both parents hard to wake up"   Heart murmur    "dx'd when I was a teenager; haven't had any problems w/it"   Hip pain, right    History of bronchitis    Hypertension    Urinary incontinence    Urinary urgency    UTI (lower urinary tract infection)    Review of Systems:  Review of Systems  Constitutional:  Negative for chills and fever.  Gastrointestinal:  Negative for abdominal pain.  Genitourinary:  Negative for dysuria, flank pain, frequency and urgency.       Positive for creamy, beige colored discharge when she wipes after using the restroom. Negative for pruritus.    Physical Exam:  Vitals:   03/30/21 0924  BP: (!) 143/87  Pulse: 74  Temp: 98.3 F (36.8 C)  TempSrc: Oral  SpO2: 100%  Weight: 222 lb (100.7 kg)  Height: 5\' 2"  (1.575 m)   Constitutional: Well-appearing female in no acute distress. Cardio: Regular rate and rhythm.  No murmurs, rubs, gallops. Pulm: Clear to auscultation bilaterally. Abdomen: Soft, nontender. MSK: Negative for extremity edema. Skin: Skin is warm and dry. Neuro: Alert and oriented x3.  No focal deficit noted. Psych: Normal mood and affect.  Assessment & Plan:   See Encounters Tab for problem based charting.  Patient seen with  Dr. Daryll Drown

## 2021-03-30 NOTE — Patient Instructions (Addendum)
Thank you for visiting the Internal Medicine Clinic today. It was a pleasure to meet you! Today we discussed your desire to obtain STI testing.  I will call you once all of these results have come in.  I have ordered the following for you:  Lab orders: Vaginal swab for chlamydia, gonorrhea, trichomoniasis, bacterial vaginosis, yeast Blood draw for syphilis, HIV  Follow-up: In 3 months with your PCP.  Remember: If you have any questions or concerns, please call our clinic at [phone] between 9am-5pm and after hours call [phone] and ask for the internal medicine resident on call. If you feel you are having a medical emergency please call 911.  Farrel Gordon, DO

## 2021-03-30 NOTE — Assessment & Plan Note (Addendum)
Patient reports today with the complaint of vaginal discharge that is creamy and beige-colored, noted only when she wipes.  She says that there is no deposition of discharge in her underwear otherwise.  She denies dysuria, polyuria, vaginal itch, odor.  She does not have any lower abdominal pain.  She has not had a menstrual cycle in 15 months.  She does have a history of bacterial vaginosis and yeast infection.  Of note, the patient has had 1 new sexual partner in the last 3 months.  She states that he was dishonest regarding his sexual activity which is why she is concerned and requesting testing today. Assessment: Patient has had hypersexual behavior in the last several months and is appropriate for STI screening at this time. Plan: Patient has elected to self swab for gonorrhea, chlamydia, bacterial vaginosis, yeast as well as obtain labs for syphilis and HIV.

## 2021-03-31 ENCOUNTER — Telehealth: Payer: Self-pay | Admitting: Internal Medicine

## 2021-03-31 LAB — HIV ANTIBODY (ROUTINE TESTING W REFLEX): HIV Screen 4th Generation wRfx: NONREACTIVE

## 2021-03-31 LAB — CERVICOVAGINAL ANCILLARY ONLY
Bacterial Vaginitis (gardnerella): NEGATIVE
Candida Glabrata: NEGATIVE
Candida Vaginitis: NEGATIVE
Chlamydia: NEGATIVE
Comment: NEGATIVE
Comment: NEGATIVE
Comment: NEGATIVE
Comment: NEGATIVE
Comment: NEGATIVE
Comment: NORMAL
Neisseria Gonorrhea: NEGATIVE
Trichomonas: NEGATIVE

## 2021-03-31 LAB — RPR: RPR Ser Ql: NONREACTIVE

## 2021-03-31 NOTE — Telephone Encounter (Signed)
Spoke with patient to inform her that the results of her labs collected at yesterday's visit were negative.  She asked about the discharge described at her visit and I advised her to contact the clinic to be evaluated if she notices an increase in the amount of discharge or other symptoms develop such as pain, itching, burning with urination.  She voiced understanding.

## 2021-04-02 NOTE — Progress Notes (Signed)
Internal Medicine Clinic Attending  I saw and evaluated the patient.  I personally confirmed the key portions of the history and exam documented by Dr.  Dean  and I reviewed pertinent patient test results.  The assessment, diagnosis, and plan were formulated together and I agree with the documentation in the resident's note.  

## 2021-05-26 ENCOUNTER — Encounter: Payer: Self-pay | Admitting: *Deleted

## 2021-06-08 ENCOUNTER — Encounter: Payer: Self-pay | Admitting: Internal Medicine

## 2021-06-08 ENCOUNTER — Other Ambulatory Visit: Payer: Self-pay

## 2021-06-08 ENCOUNTER — Ambulatory Visit (INDEPENDENT_AMBULATORY_CARE_PROVIDER_SITE_OTHER): Payer: PPO | Admitting: Internal Medicine

## 2021-06-08 DIAGNOSIS — L299 Pruritus, unspecified: Secondary | ICD-10-CM | POA: Diagnosis not present

## 2021-06-08 DIAGNOSIS — Z7251 High risk heterosexual behavior: Secondary | ICD-10-CM | POA: Diagnosis not present

## 2021-06-08 DIAGNOSIS — I10 Essential (primary) hypertension: Secondary | ICD-10-CM | POA: Diagnosis not present

## 2021-06-08 HISTORY — DX: Pruritus, unspecified: L29.9

## 2021-06-08 MED ORDER — KETOCONAZOLE 2 % EX CREA: 1.0000 "application " | TOPICAL_CREAM | Freq: Every day | CUTANEOUS | 0 refills | Status: AC

## 2021-06-08 NOTE — Progress Notes (Signed)
°  CC: itching symptoms  HPI:  Brandi Chase is a 56 y.o. female with a past medical history stated below and presents today for itching symptoms on her upper mid back and bilateral flanks . Please see problem based assessment and plan for additional details.  Past Medical History:  Diagnosis Date   Adnexal cyst 2011   removed   Anemia    Arthritis    "neck; spine" (08/29/2014)   Asthma    Carpal tunnel syndrome of left wrist    Chronic lower back pain    Complication of anesthesia    difficult to awake   Daily headache 12/2013   "tx'd and no problems anymore" (08/29/2014)   Dyspnea    with asthma attack- none recent   Family history of adverse reaction to anesthesia    "1 sister and both parents hard to wake up"   Heart murmur    "dx'd when I was a teenager; haven't had any problems w/it"   Hip pain, right    History of bronchitis    Hypertension    Urinary incontinence    Urinary urgency    UTI (lower urinary tract infection)     Current Outpatient Medications on File Prior to Visit  Medication Sig Dispense Refill   Cetirizine HCl 10 MG CAPS Take 1 capsule (10 mg total) by mouth daily for 10 days. 10 capsule 0   triamcinolone cream (KENALOG) 0.1 % Apply 1 application topically 2 (two) times daily. 30 g 0   No current facility-administered medications on file prior to visit.    Family History  Problem Relation Age of Onset   Pancreatic cancer Mother    Cancer Mother    Hypertension Mother    Arthritis Mother    Prostate cancer Father    Cancer Father        Prostate   Hypertension Father    Asthma Father    Allergic rhinitis Father    Arthritis Sister    Scoliosis Sister    Sleep apnea Daughter    Narcolepsy Daughter    Asthma Daughter    Colon cancer Neg Hx    Liver cancer Neg Hx    Stomach cancer Neg Hx    Rectal cancer Neg Hx    Esophageal cancer Neg Hx     Review of Systems: ROS negative except for what is noted on the assessment and  plan.  Vitals:   06/08/21 0840  BP: 132/85  Pulse: 75  Temp: 98.2 F (36.8 C)  TempSrc: Oral  SpO2: 100%  Weight: 216 lb 3.2 oz (98.1 kg)  Height: 5\' 2"  (1.575 m)     Physical Exam: General: Well appearing african [place] female, NAD HENT: normocephalic, atraumatic EYES: conjunctiva non-erythematous, no scleral icterus CV: regular rate, normal rhythm, no murmurs, rubs, gallops. Pulmonary: normal work of breathing on RA, lungs clear to auscultation, no rales, wheezes, rhonchi Abdominal: non-distended, soft, mild tenderness to palpation bilateral lower abdominal quadrants, normal BS Skin: Warm and dry, back with hyperpigmentation with patch of hypopigmentation in the center between the shoulder blades, see image in media tab Neurological: MS: awake, alert and oriented x3, normal speech and fund of knowledge Motor: moves all extremities antigravity Psych: normal affect    Assessment & Plan:   See Encounters Tab for problem based charting.  Patient discussed with Dr. Jilda Panda, M.D. [facility], PGY-1 Pager: [phone] Date 06/08/2021 Time 8:50 AM

## 2021-06-08 NOTE — Assessment & Plan Note (Addendum)
Patient reports persistence of tan-colored vaginal discharge that she appreciates only when she wipes, not present in her undergarments.  Had STI testing and cervical vaginal swab performed in December, 2 months ago, which were negative.  Patient denies urinary frequency, dysuria, hematuria, vaginal itching, fever, malaise.  She does report mild bilateral lower abdominal quadrant pain which is worse in the mornings.  On assessment low concern for UTI, PID at this time.  Patient does have history of Adenomyosis and Dysfunctional Uterine Bleeding as well as history of pelvic mass unspecified found incidentally, identified in 2015.  She was seen by OB/GYN at that time and pelvic ultrasound was ordered though patient no showed the appointment.  Patient would benefit from an office visit dedicated to a pelvic exam and further evaluation of symptoms.  Patient will prefer to do Pelvic exam done at [facility] rather than referral to OB/GYN at this time.  Discussed with patient that if we do identify a concern outside of the scope of internal medicine we would refer to OB/GYN and patient expressed understanding.  Plan: -Follow-up in 2 to 3 weeks for pelvic exam and further evaluation of gynecologic symptoms.

## 2021-06-08 NOTE — Patient Instructions (Signed)
Thank you, Ms.Margo Common for allowing [place] to provide your care today. Today we discussed itching symptoms.  This may be a fungal rash called tinea versicolor which can be treated with antifungal cream. We will try this first, apply once daily for 14 days. If you have not had an improvement in symptoms please let us know. 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   For your persistent vaginal discharge we would like to do a vaginal exam. Please come back and see [place] in 2-3 weeks both to follow up on the itching symptoms and to do a vaginal exam.  My Chart Access: [email or web address]  Please follow-up in 2-3 weeks.  Please make sure to arrive 15 minutes prior to your next appointment. If you arrive late, you may be asked to reschedule.    We look forward to seeing you next time. Please call our clinic at [phone] if you have any questions or concerns. The best time to call is Monday-Friday from 9am-4pm, but there is someone available 24/7. If after hours or the weekend, call the main hospital number and ask for the Internal Medicine Resident On-Call. If you need medication refills, please notify your pharmacy one week in advance and they will send [place] a request.   Thank you for letting us take part in your care. Wishing you the best!  Wayland Denis, MD 06/08/2021, 9:27 AM IM Resident, PGY-1

## 2021-06-08 NOTE — Assessment & Plan Note (Addendum)
Patient's blood pressure today is 132/85.  Patient not currently taking any antihypertensive medication.  Has had elevated BPs at last few OVs and may need antihypertensive medication in the future. Currently blood pressure well controlled without medication.

## 2021-06-08 NOTE — Assessment & Plan Note (Addendum)
Patient endorses daily pruritus on her back and bilateral flanks for 2-3 months (since December).  She denies seeing a rash on her skin at any point in the last few months.  Rash is not worse at any particular point the day.  She switched laundry detergents around that time from Tide liquid to a sensitive formula but has since switched back to Tide liquid and has seen no change in symptoms.  Denies changes in other soap products, lotions, diet, medications.  Currently takes no medications aside from ibuprofen which she does not feel is the culprit.  Recently she has been using Vaseline which she reports may help.  Previously she had been using [employer] which she has been using for many months prior to start of symptoms but noted that when she would put the lotion on it seemed to worsen the itching. She denies dry flaking skin.  On exam, patient has patchy hyperpigmentation of the mid to lower back bilaterally.  Between her shoulder blades where she reports the most itching she has patchy hypopigmentation, lighter than her normal skin color appreciated on her upper back, see image.  No scaling, erythema, raised lesions.  She has not tried a cortisone cream or antifungal cream.  On assessment, patient presents with 2 to 3 months of persistent daily itching symptoms which has not improved with daily moisturizing lotion.  We have been unable to identify a triggering substance such as lotion, soap, medication, change in diet.  Patient has no personal history of kidney disease.  On exam she did have a questionable area of patchy hypopigmentation though without scaling.  Given this area of hypopigmentation and report of rash worsening with lotion, this may be tinea versicolor though no evidence of scaling and more common in warm humid months.  It would be reasonable to try ketoconazole 2% cream applied to the affected areas once daily for 14 days.  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  This condition causes chronic pruritus commonly confined to the dermatomes of T2-T6 (medial or inferior to the scapula), usually unilateral and accompanied by pain, paresthesias, or altered sensation to touch.  Patch of lotion of occasional postinflammatory hyperpigmentation can be the result of chronic scratching, more prevalent in middle-aged woman and often last for years.  If patient symptoms does not improve with antifungal could consider topical capsaicin cream for treatment of notalgia paresthetica.  Topical steroids or antihistamines generally do not address the neuropathic itch of NP.  Plan: -Try ketoconazole 2% cream once daily to affected areas for 14 days -Follow-up in 2 to 3 weeks for reevaluation -Consider topical capsaicin cream if symptoms persist for diagnosis of notalgia prostatic

## 2021-06-09 NOTE — Progress Notes (Signed)
Internal Medicine Clinic Attending ? ?Case discussed with Dr. Zinoviev  At the time of the visit.  We reviewed the resident?s history and exam and pertinent patient test results.  I agree with the assessment, diagnosis, and plan of care documented in the resident?s note.  ?

## 2021-06-17 ENCOUNTER — Encounter: Payer: PPO | Admitting: Student

## 2021-06-23 ENCOUNTER — Encounter: Payer: PPO | Admitting: Student

## 2021-07-22 ENCOUNTER — Ambulatory Visit (INDEPENDENT_AMBULATORY_CARE_PROVIDER_SITE_OTHER): Payer: PPO | Admitting: Internal Medicine

## 2021-07-22 DIAGNOSIS — M25551 Pain in right hip: Secondary | ICD-10-CM

## 2021-07-22 DIAGNOSIS — I10 Essential (primary) hypertension: Secondary | ICD-10-CM | POA: Diagnosis not present

## 2021-07-22 DIAGNOSIS — M25559 Pain in unspecified hip: Secondary | ICD-10-CM | POA: Insufficient documentation

## 2021-07-22 DIAGNOSIS — Z0000 Encounter for general adult medical examination without abnormal findings: Secondary | ICD-10-CM | POA: Insufficient documentation

## 2021-07-22 DIAGNOSIS — N390 Urinary tract infection, site not specified: Secondary | ICD-10-CM

## 2021-07-22 HISTORY — DX: Pain in unspecified hip: M25.559

## 2021-07-22 NOTE — Progress Notes (Signed)
? ?  CC: hip pain ? ?HPI: ? ?Brandi Chase is a 56 y.o. PMH noted below, who presents to the [facility] with complaints of hip pain. To see the management of his acute and chronic conditions, please refer to the A&P note under the encounters tab.  ? ?Past Medical History:  ?Diagnosis Date  ? Adnexal cyst 2011  ? removed  ? Anemia   ? Arthritis   ? "neck; spine" (08/29/2014)  ? Asthma   ? Carpal tunnel syndrome of left wrist   ? Chronic lower back pain   ? Complication of anesthesia   ? difficult to awake  ? Daily headache 12/2013  ? "tx'd and no problems anymore" (08/29/2014)  ? Dyspnea   ? with asthma attack- none recent  ? Family history of adverse reaction to anesthesia   ? "1 sister and both parents hard to wake up"  ? Heart murmur   ? "dx'd when I was a teenager; haven't had any problems w/it"  ? Hip pain, right   ? History of bronchitis   ? Hypertension   ? Urinary incontinence   ? Urinary urgency   ? UTI (lower urinary tract infection)   ? ?Review of Systems:  positive for hip pain, negative for dysuria, vaginal discharge ? ?Physical Exam: ?Gen: obese woman in NAD ?HEENT: normocephalic atraumatic, MMM ?CV: RRR, no m/r/g   ?Resp: CTAB, normal WOB  ?GI: soft, nontender ?MSK: moves all extremities without difficulty, full range of motion in RLE, 5/5 strength ?Skin:warm and dry ?Neuro:alert answering questions appropriately, normal gait ?Psych: normal affect ? ? ?Assessment & Plan:  ? ?See Encounters Tab for problem based charting. ? ?Patient discussed with Dr. Jimmye Norman  ? ?

## 2021-07-22 NOTE — Progress Notes (Signed)
Internal Medicine Clinic Attending ° °Case discussed with Dr. DeMaio  At the time of the visit.  We reviewed the resident’s history and exam and pertinent patient test results.  I agree with the assessment, diagnosis, and plan of care documented in the resident’s note. ° ° °

## 2021-07-22 NOTE — Assessment & Plan Note (Signed)
Patient tweaked something in her right hip when she was bending over and trying to put on her sock. 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 Tried to be seen by orthopedist however could not get in until the 18th. Has tried PT in the past which has helped but it cost her $30 every time and she doesn't want to pay that right now. She has tried ibuprofen in the past for pain but did not take anything for this because she had been recently taking an antibiotics for a UTI given to her by her OBGYN and wasn't sure about mixing meds. On exam she has full range of motion, some mild tenderness to palpation, strength at baseline. Able to walk normally ? ?A/P ?Suspect she has a mild muscle strain. Will do conservative management with 600 mg ibuprofen TID until she sees her orthopedist. Patient says that she is unable to take tylenol as she was told by someone in the past that it would cause her to have blood clots. ?- consider PT ?- heating pads PRN ?- ibuprofen ?

## 2021-07-22 NOTE — Assessment & Plan Note (Signed)
Patient has recently completed a course of keflex with her OBGYN for a UTI. Says that she did not really have any symptoms other than the abnormal discharge she had been having that is now resolved. She finished the Keflex about a week ago. Asymptomatic today.  ?

## 2021-07-22 NOTE — Patient Instructions (Addendum)
Brandi Chase ? ?It was a pleasure seeing you in the clinic today.  ? ?We talked about your blood pressure, your hip pain, and your recent UTI. ? ?Hip pain- take ibuprofen 600 mg three times a day to help with the inflammation until you are able to see your orthopedist. Also use heat on the area as needed ?Blood pressure- your blood pressure is a little elevated today, however given your hip pain I am going to hold off on starting any meds today.We can see you back in about a month or so to recheck things ?Tetanus shot- please check with your pharmacy to see if this is covered ? ?Please call our clinic at [phone] if you have any questions or concerns. The best time to call is Monday-Friday from 9am-4pm, but there is someone available 24/7 at the same number. If you need medication refills, please notify your pharmacy one week in advance and they will send [place] a request. ?  ?Thank you for letting us take part in your care. We look forward to seeing you next time! ? ?

## 2021-07-22 NOTE — Assessment & Plan Note (Signed)
BP elevated today to 144/95 on recheck. Suspect this is in part to acute hip pain patient is experiencing however her values have been intermittently elevated over the last several visits. Will defer starting a new medication today but if still elevated at f/u may need to start medication.  ?

## 2021-07-22 NOTE — Assessment & Plan Note (Signed)
Discussed tetanus shot today, patient to check with her pharmacy to see if it is covered. If not, will discuss getting it in clinic at next visit. ?

## 2021-08-24 ENCOUNTER — Encounter: Payer: PPO | Admitting: Internal Medicine

## 2021-08-27 ENCOUNTER — Encounter: Payer: Self-pay | Admitting: Internal Medicine

## 2021-08-27 ENCOUNTER — Other Ambulatory Visit: Payer: Self-pay

## 2021-08-27 ENCOUNTER — Ambulatory Visit (INDEPENDENT_AMBULATORY_CARE_PROVIDER_SITE_OTHER): Payer: PPO | Admitting: Internal Medicine

## 2021-08-27 DIAGNOSIS — K5790 Diverticulosis of intestine, part unspecified, without perforation or abscess without bleeding: Secondary | ICD-10-CM | POA: Diagnosis not present

## 2021-08-27 DIAGNOSIS — I10 Essential (primary) hypertension: Secondary | ICD-10-CM | POA: Diagnosis not present

## 2021-08-27 DIAGNOSIS — R1032 Left lower quadrant pain: Secondary | ICD-10-CM

## 2021-08-27 DIAGNOSIS — R109 Unspecified abdominal pain: Secondary | ICD-10-CM | POA: Insufficient documentation

## 2021-08-27 NOTE — Assessment & Plan Note (Signed)
BP is improved today at 132/96, patient left before repeat was obtained however this is improved from the 140s she has been prior. - f/u at next visit

## 2021-08-27 NOTE — Patient Instructions (Signed)
Brandi Chase  It was a pleasure seeing you in the clinic today.   We talked about your abdominal pain.  OBGYN records- we are waiting on your records from your OBGYN to see what imaging they were recommending and why  Abdominal pain- please let us know if this returns. However since your pain has resolved you do not need any further imaging or labs today. If anything changes please call us.   Please call our clinic at [phone] if you have any questions or concerns. The best time to call is Monday-Friday from 9am-4pm, but there is someone available 24/7 at the same number. If you need medication refills, please notify your pharmacy one week in advance and they will send [place] a request.   Thank you for letting us take part in your care. We look forward to seeing you next time!

## 2021-08-27 NOTE — Assessment & Plan Note (Addendum)
Patient coming in after seeing her OBGYN last week due to abdominal pain. She has an extensive OBGYN history with prior rupture of ovarian cysts, adenomyosis and dysfunctional uterine bleeding, salpingectomy, etc. The pain started Tuesday or Wednesday and came on gradually. It got worse to the point where it was worse than giving birth. She says that it was in her left lower quadrant. Never had pain before when she had an ovarian cyst. She reportedly got an ultrasound at her OBGYN and was told to go to her PCP and get a CT scan. We do not have access to these notes Brandi Chase OBG[facility]). The pain resolved on its own on Saturday. Denies any diarrhea or constipation, no discharge, or uterine bleeding. Unclear without these records whether there was any OBGYN related cause identified or if based on the ultrasound the OBGYN suspected a non-gynecologic cause and was recommending a CT scan to identify another cause. Records faxed over from OBGYN. Transvaginal ultrasound demonstrated adenomyosis and slightly thickened endometrium but no other masses.  Plan: - no further imaging at this time since pain has resolved - information on diverticulosis provided - return precautions discussed

## 2021-08-27 NOTE — Assessment & Plan Note (Signed)
Seen on last colonoscopy

## 2021-08-27 NOTE — Progress Notes (Signed)
   CC: abdominal pain  HPI:  [name] is a 56 y.o. PMH noted below, who presents to the [facility] with complaints of abdominal pain. To see the management of his acute and chronic conditions, please refer to the A&P note under the encounters tab.   Past Medical History:  Diagnosis Date   Adnexal cyst 2011   removed   Anemia    Arthritis    "neck; spine" (08/29/2014)   Asthma    Carpal tunnel syndrome of left wrist    Chronic lower back pain    Complication of anesthesia    difficult to awake   Daily headache 12/2013   "tx'd and no problems anymore" (08/29/2014)   Dyspnea    with asthma attack- none recent   Family history of adverse reaction to anesthesia    "1 sister and both parents hard to wake up"   Heart murmur    "dx'd when I was a teenager; haven't had any problems w/it"   Hip pain, right    History of bronchitis    Hypertension    Urinary incontinence    Urinary urgency    UTI (lower urinary tract infection)    Review of Systems:  positive for abdominal pain, nausea, negative for vomiting, diarrhea, bleeding, discharge, constipation, fever, chills, or dysuria   Physical Exam: Gen: obese middle aged woman in NAD HEENT: normocephalic atraumatic, MMM, neck supple CV: RRR, no m/r/g   Resp: CTAB, normal WOB  GI: soft, nontender, normal bowel sounds MSK: moves all extremities without difficulty Skin:warm and dry Neuro:alert answering questions appropriately Psych: normal affect   Assessment & Plan:   See Encounters Tab for problem based charting.  Patient discussed with Dr. Philipp Ovens

## 2021-08-28 IMAGING — MG MM DIGITAL SCREENING BILAT W/ TOMO AND CAD
6 of 10 series · 6 of 30 positions shown · non-contrast
Comparison: Previous exam(s).

CLINICAL DATA: Screening.

EXAM:
DIGITAL SCREENING BILATERAL MAMMOGRAM WITH TOMOSYNTHESIS AND CAD
TECHNIQUE: Bilateral screening digital craniocaudal and mediolateral oblique
mammograms were obtained. Bilateral screening digital breast
tomosynthesis was performed. The images were evaluated with
computer-aided detection.

[R CC synth-2D]
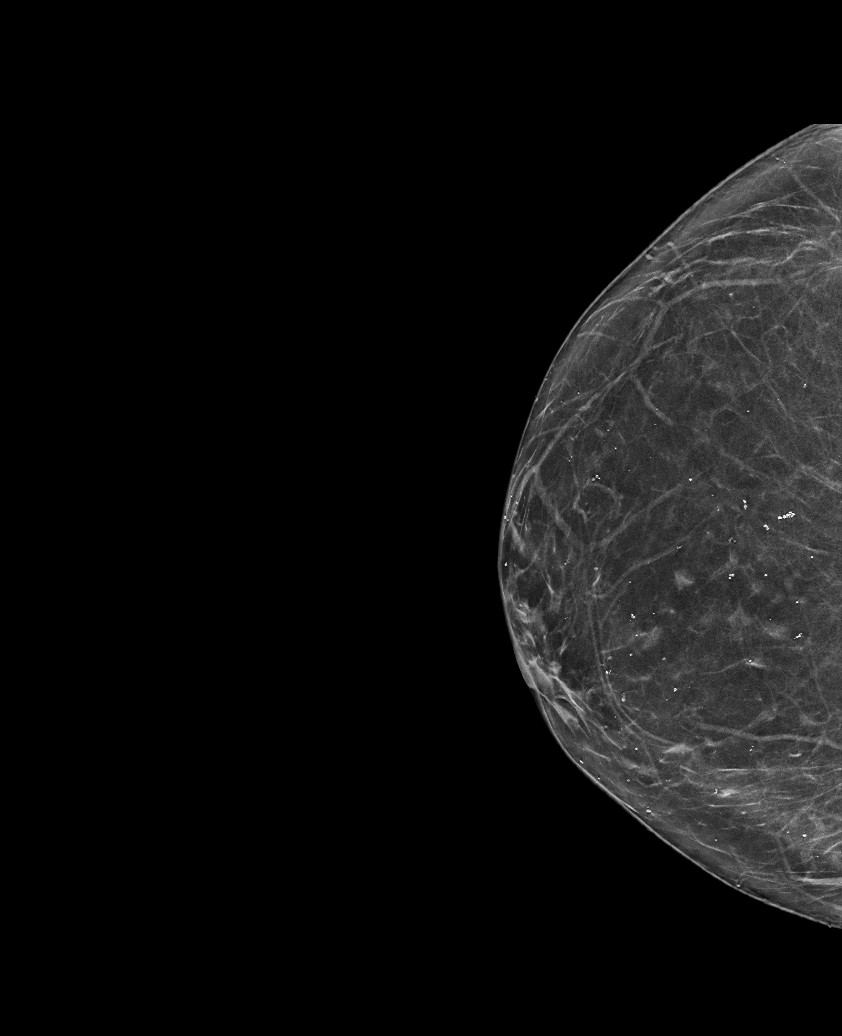

[L CC synth-2D (1 of 2)]
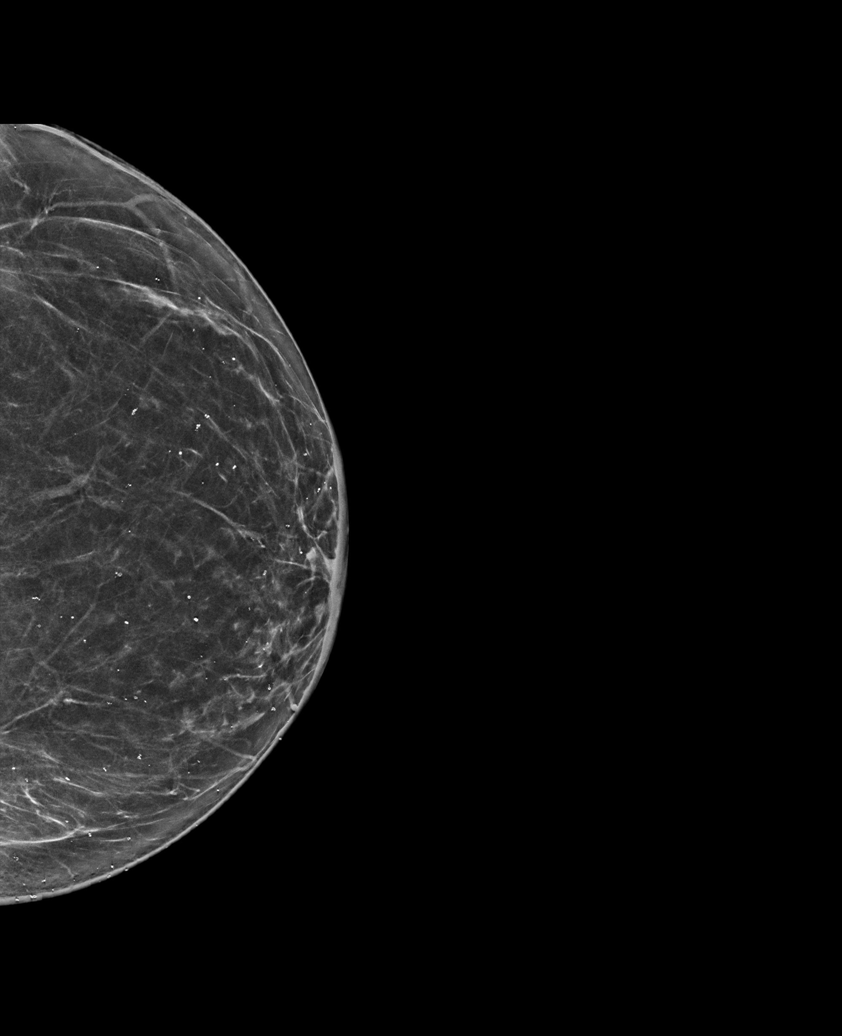

[L MLO synth-2D]
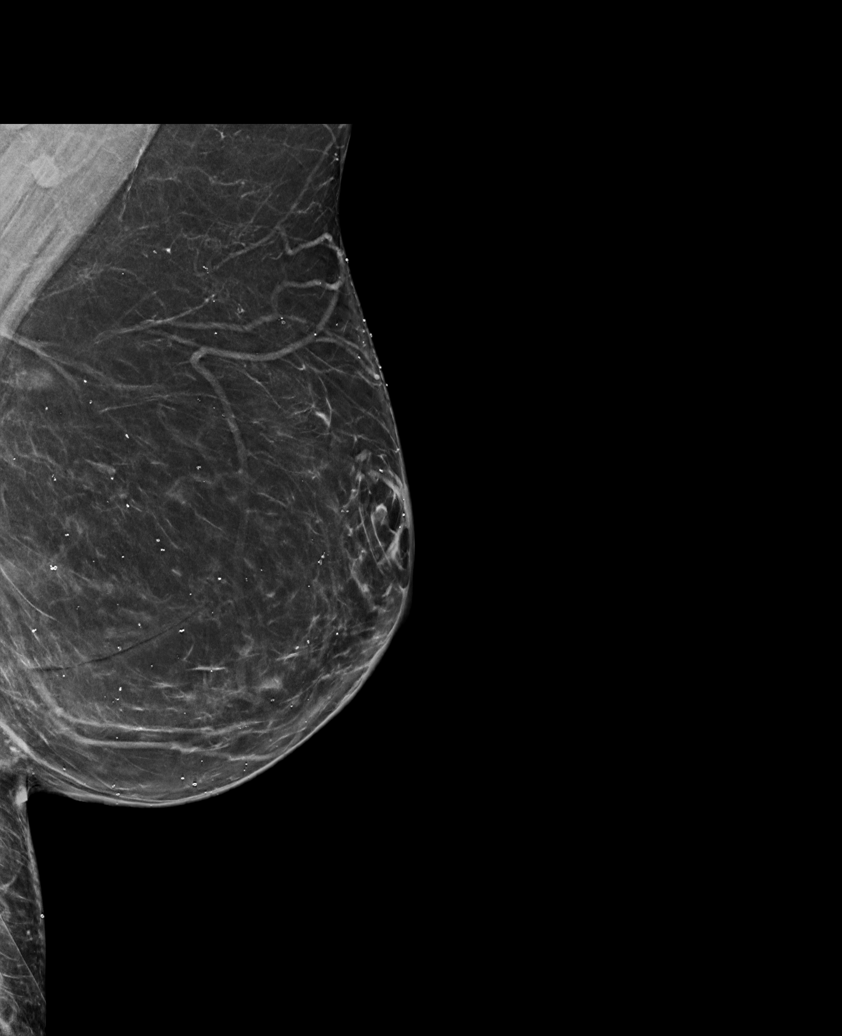

[R MLO synth-2D]
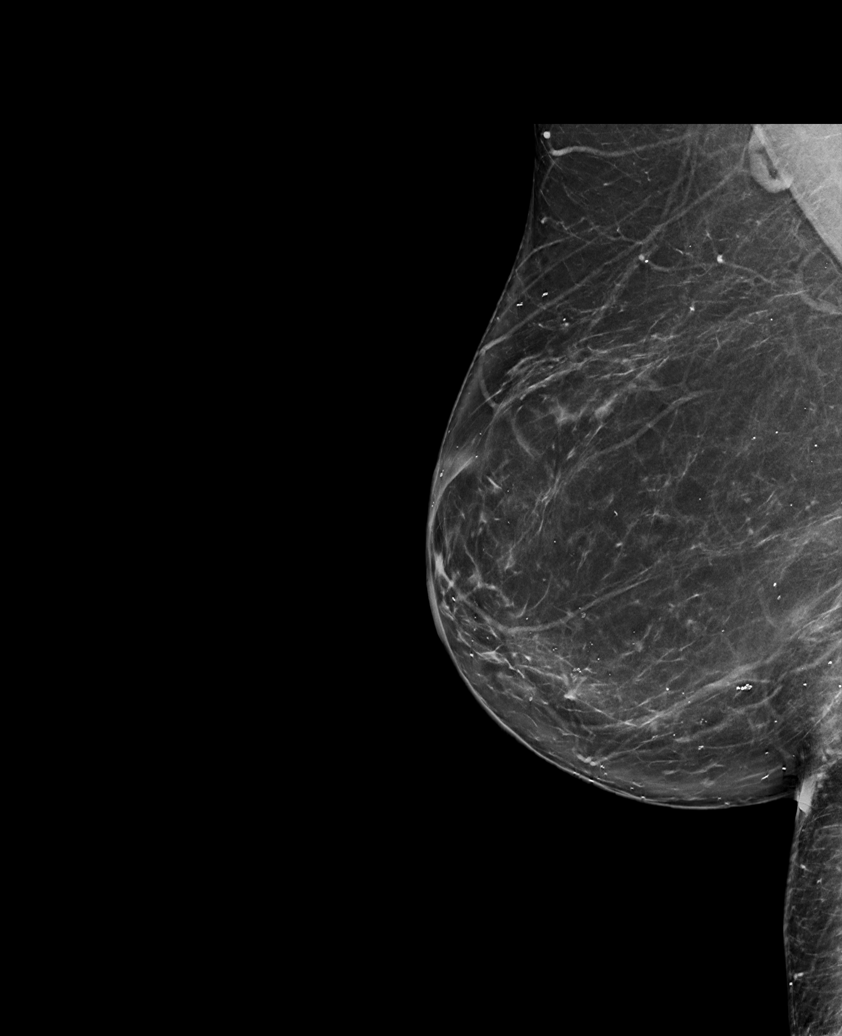

[L CC synth-2D (2 of 2)]
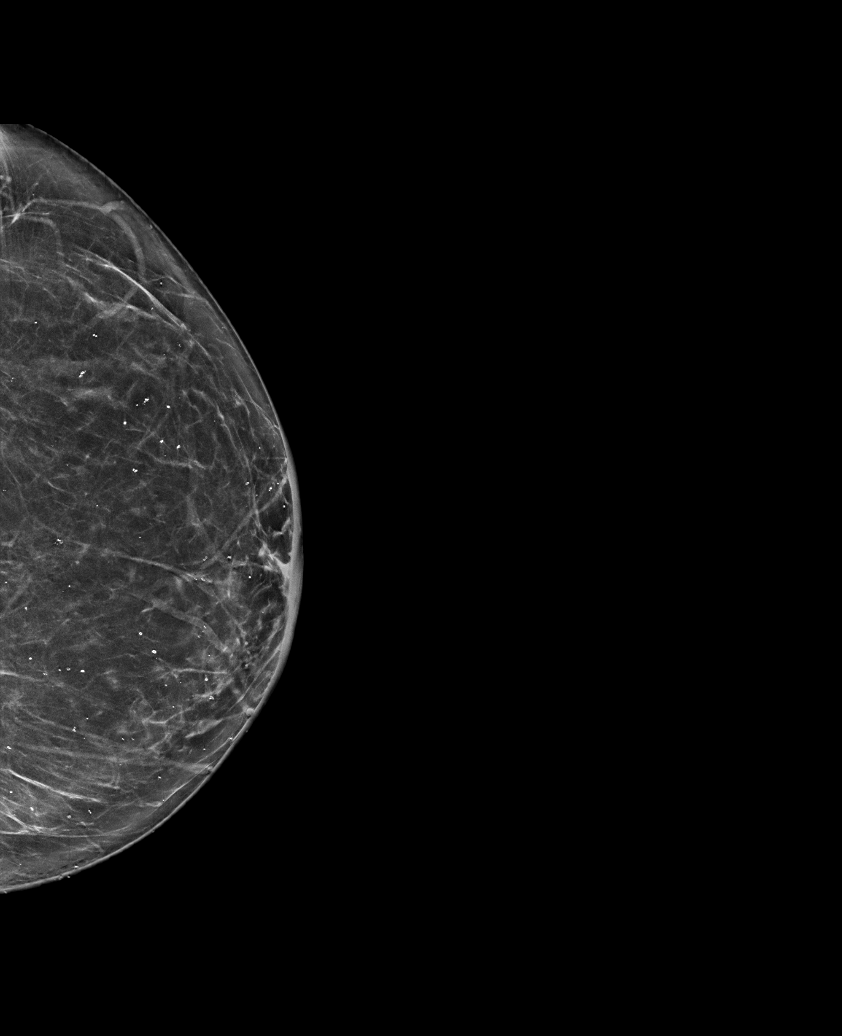

[R MLO tomo · tomo slice 43/85.0]
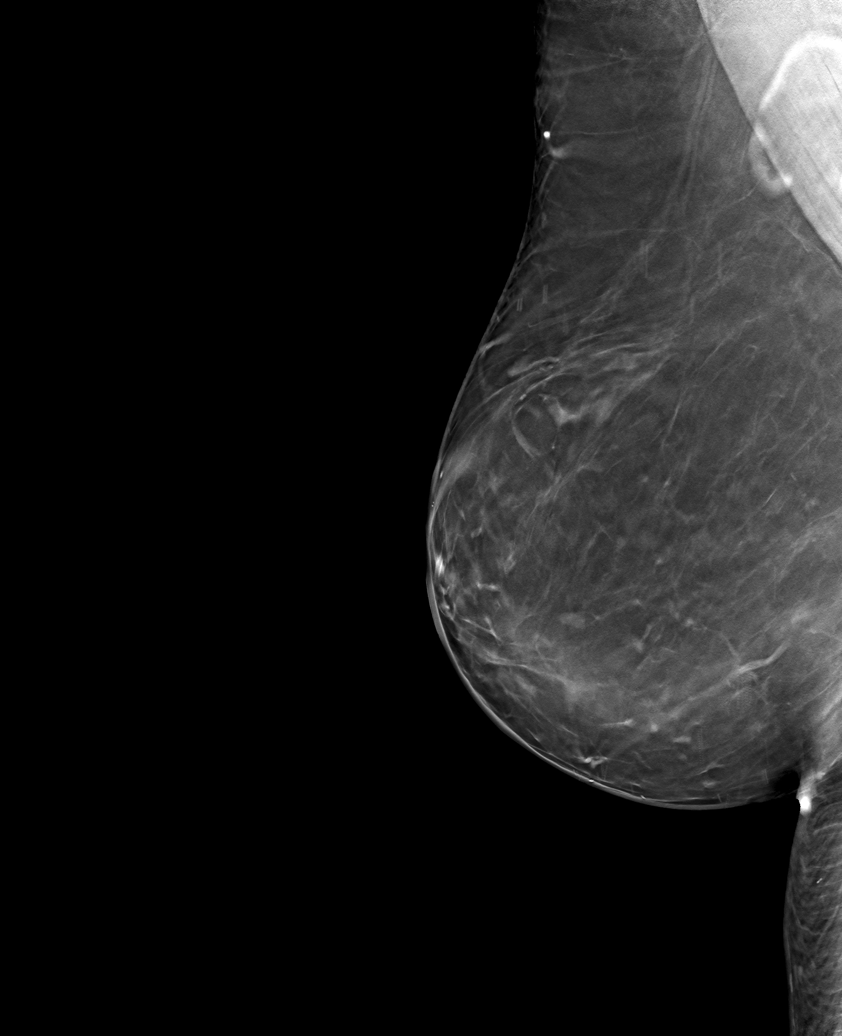

[6 of 30 positions shown; findings below may reference images not displayed]

ACR Breast Density Category b: There are scattered areas of
fibroglandular density.
FINDINGS: There are no findings suspicious for malignancy.
IMPRESSION: No mammographic evidence of malignancy. A result letter of this
screening mammogram will be mailed directly to the patient.

RECOMMENDATION:
Screening mammogram in one year. (Code:51-O-LD2)

BI-RADS CATEGORY  1: Negative.

## 2021-09-13 NOTE — Progress Notes (Signed)
Internal Medicine Clinic Attending ° °Case discussed with Dr. DeMaio  At the time of the visit.  We reviewed the resident’s history and exam and pertinent patient test results.  I agree with the assessment, diagnosis, and plan of care documented in the resident’s note. ° ° °

## 2021-09-30 ENCOUNTER — Other Ambulatory Visit: Payer: Self-pay | Admitting: Student

## 2021-09-30 DIAGNOSIS — Z1231 Encounter for screening mammogram for malignant neoplasm of breast: Secondary | ICD-10-CM

## 2021-11-04 ENCOUNTER — Encounter: Payer: Self-pay | Admitting: Student

## 2021-11-04 ENCOUNTER — Ambulatory Visit (INDEPENDENT_AMBULATORY_CARE_PROVIDER_SITE_OTHER): Payer: PPO | Admitting: Student

## 2021-11-04 VITALS — BP 144/82 | HR 88 | Temp 98.3°F | Ht 62.0 in | Wt 221.1 lb

## 2021-11-04 DIAGNOSIS — R1032 Left lower quadrant pain: Secondary | ICD-10-CM | POA: Diagnosis not present

## 2021-11-04 DIAGNOSIS — N390 Urinary tract infection, site not specified: Secondary | ICD-10-CM | POA: Diagnosis not present

## 2021-11-04 LAB — POCT URINALYSIS DIPSTICK OB
Bilirubin, UA: NEGATIVE
Glucose, UA: NEGATIVE
Ketones, UA: NEGATIVE
Leukocytes, UA: NEGATIVE
Nitrite, UA: NEGATIVE
POC,PROTEIN,UA: NEGATIVE
Spec Grav, UA: >=1.03 — AB (ref 1.010–1.025)
Urobilinogen, UA: 0.2 E.U./dL
pH, UA: 5 (ref 5.0–8.0)

## 2021-11-04 NOTE — Progress Notes (Signed)
CC: Abdominal Pain  HPI:  [name] is a 56 y.o. female living with a history stated below and presents today for Abdominal Pain. Please see problem based assessment and plan for additional details.  Past Medical History:  Diagnosis Date   Adnexal cyst 2011   removed   Anemia    Arthritis    "neck; spine" (08/29/2014)   Asthma    Carpal tunnel syndrome of left wrist    Chronic lower back pain    Complication of anesthesia    difficult to awake   Daily headache 12/2013   "tx'd and no problems anymore" (08/29/2014)   Dyspnea    with asthma attack- none recent   Family history of adverse reaction to anesthesia    "1 sister and both parents hard to wake up"   Heart murmur    "dx'd when I was a teenager; haven't had any problems w/it"   Hip pain, right    History of bronchitis    Hypertension    Urinary incontinence    Urinary urgency    UTI (lower urinary tract infection)     Current Outpatient Medications on File Prior to Visit  Medication Sig Dispense Refill   ibuprofen (ADVIL) 200 MG tablet Take 600 mg by mouth every 6 (six) hours as needed.     No current facility-administered medications on file prior to visit.    Family History  Problem Relation Age of Onset   Pancreatic cancer Mother    Cancer Mother    Hypertension Mother    Arthritis Mother    Prostate cancer Father    Cancer Father        Prostate   Hypertension Father    Asthma Father    Allergic rhinitis Father    Arthritis Sister    Scoliosis Sister    Sleep apnea Daughter    Narcolepsy Daughter    Asthma Daughter    Colon cancer Neg Hx    Liver cancer Neg Hx    Stomach cancer Neg Hx    Rectal cancer Neg Hx    Esophageal cancer Neg Hx     Social History   Socioeconomic History   Marital status: Single    Spouse name: Not on file   Number of children: 3   Years of education: Not on file   Highest education level: Not on file  Occupational History   Occupation: Disabled    Occupation: CNA  Tobacco Use   Smoking status: Never   Smokeless tobacco: Never  Vaping Use   Vaping Use: Never used  Substance and Sexual Activity   Alcohol use: No   Drug use: No   Sexual activity: Yes    Birth control/protection: Surgical  Other Topics Concern   Not on file  Social History Narrative   Current Social History 01/03/2019        Patient lives with daughter and 102 yo grandson in a second floor apartment. There are 14 steps with handrails up to the entrance the patient uses.       Patient's method of transportation is personal car.      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      The patient currently disabled.      Identified important Relationships are "My brother's girlfriend, they've been together 20 years."       Pets : None       Interests / Fun: [place], [place]"      Current Stressors: "Nothing really. I try not to let things stress me."       Religious / Personal Beliefs: "Christian"       L. [place]atte, RN, BSN       Social Determinants of Health   Financial Resource Strain: Not on file  Food Insecurity: Not on file  Transportation Needs: Not on file  Physical Activity: Not on file  Stress: Not on file  Social Connections: Not on file  Intimate Partner Violence: Not on file    Review of Systems: ROS negative except for what is noted on the assessment and plan.  Vitals:   11/04/21 1315  BP: (!) 144/82  Pulse: 88  Temp: 98.3 F (36.8 C)  TempSrc: Oral  SpO2: 100%  Weight: 221 lb 1.6 oz (100.3 kg)  Height: '5\' 2"'$  (1.575 m)    Physical Exam: Constitutional: well-appearing woman sitting comfortably, in no acute distress Cardiovascular: regular rate and rhythm, no m/r/g Pulmonary/Chest: normal work of breathing on room air, lungs clear to auscultation bilaterally Abdominal: soft, non-tender, non-distended, tender in the LLQ  Assessment & Plan:   Abdominal pain Pt has had abdominal  pain in the LLQ since Friday. The pain has been intermittent since. She states it's at a 4/10. She says the pain comes on when she is starting to sit, and once she does sit it goes away. She has not taken any medication to help the pain. She also says she has some blood in her urine from time to time, and is also positive for nausea. She denies any back pain, fever, CVA tenderness. She does not smoke, and is post-menopausal.   Plan:  - Due to painless hematuria as well as abdominal pain, this could be kidney stones or at worst a malignancy (family history is positive for kidney cancer in her aunt)  - I ordered a non-contract CT of the abdomen to evaluate for masses - I also ordered a CBC, BMP, and UA to evaluate electrolytes, WBC counts and potential UTI - Advised patient to take ibuprofen to help with pain  Patient seen with Dr. Ronny Bacon Shalita Notte, M.D. [facility], PGY-1 Phone: [phone] Date 11/04/2021 Time 7:47 PM

## 2021-11-04 NOTE — Patient Instructions (Signed)
Thank you so much for coming to the clinic today, it was a pleasure meeting you!  We talked about a few things today is a quick summary:  1.  In order to figure out exactly what is causing the symptoms, we were getting some lab work done, and a urine test done, and a CT scan of the abdomen. 2.  You can take ibuprofen for the pain, hopefully it helps!  If you have any questions please call the clinic at [phone]   Best, Dr. Marlene Lard Elion Hocker

## 2021-11-04 NOTE — Assessment & Plan Note (Addendum)
Pt has had abdominal pain in the LLQ since Friday. The pain has been intermittent since. She states it's at a 4/10. She says the pain comes on when she is starting to sit, and once she does sit it goes away. She has not taken any medication to help the pain. She also says she has some blood in her urine from time to time, and is also positive for nausea. She denies any back pain, fever, CVA tenderness. She does not smoke, and is post-menopausal.   Plan:  - Due to painless hematuria as well as abdominal pain, this could be kidney stones or at worst a malignancy (family history is positive for kidney cancer in her aunt)  - I ordered a non-contract CT of the abdomen to evaluate for masses - I also ordered a CBC, BMP, and UA to evaluate electrolytes, WBC counts and potential UTI - Advised patient to take ibuprofen to help with pain

## 2021-11-05 LAB — URINALYSIS, ROUTINE W REFLEX MICROSCOPIC
Bilirubin, UA: NEGATIVE
Glucose, UA: NEGATIVE
Ketones, UA: NEGATIVE
Leukocytes,UA: NEGATIVE
Nitrite, UA: NEGATIVE
Protein,UA: NEGATIVE
RBC, UA: NEGATIVE
Specific Gravity, UA: 1.022 (ref 1.005–1.030)
Urobilinogen, Ur: 0.2 mg/dL (ref 0.2–1.0)
pH, UA: 5 (ref 5.0–7.5)

## 2021-11-06 LAB — BMP8+ANION GAP
Anion Gap: 20 mmol/L — ABNORMAL HIGH (ref 10.0–18.0)
BUN/Creatinine Ratio: 14 (ref 9–23)
BUN: 13 mg/dL (ref 6–24)
CO2: 17 mmol/L — ABNORMAL LOW (ref 20–29)
Calcium: 9.6 mg/dL (ref 8.7–10.2)
Chloride: 106 mmol/L (ref 96–106)
Creatinine, Ser: 0.92 mg/dL (ref 0.57–1.00)
Glucose: 88 mg/dL (ref 70–99)
Potassium: 4.5 mmol/L (ref 3.5–5.2)
Sodium: 143 mmol/L (ref 134–144)
eGFR: 73 mL/min/{1.73_m2} (ref >59)

## 2021-11-06 LAB — CBC WITH DIFFERENTIAL/PLATELET
Basophils Absolute: 0 10*3/uL (ref 0.0–0.2)
Basos: 0 %
EOS (ABSOLUTE): 0.2 10*3/uL (ref 0.0–0.4)
Eos: 2 %
Hematocrit: 43.4 % (ref 34.0–46.6)
Hemoglobin: 14.2 g/dL (ref 11.1–15.9)
Immature Grans (Abs): 0 10*3/uL (ref 0.0–0.1)
Immature Granulocytes: 0 %
Lymphocytes Absolute: 2.8 10*3/uL (ref 0.7–3.1)
Lymphs: 42 %
MCH: 28.6 pg (ref 26.6–33.0)
MCHC: 32.7 g/dL (ref 31.5–35.7)
MCV: 87 fL (ref 79–97)
Monocytes Absolute: 0.5 10*3/uL (ref 0.1–0.9)
Monocytes: 8 %
Neutrophils Absolute: 3.2 10*3/uL (ref 1.4–7.0)
Neutrophils: 48 %
Platelets: 269 10*3/uL (ref 150–450)
RBC: 4.97 x10E6/uL (ref 3.77–5.28)
RDW: 13.7 % (ref 11.7–15.4)
WBC: 6.8 10*3/uL (ref 3.4–10.8)

## 2021-11-09 NOTE — Progress Notes (Signed)
Internal Medicine Clinic Attending  Case discussed with Dr. Nooruddin  at the time of the visit.  We reviewed the resident's history and exam and pertinent patient test results.  I agree with the assessment, diagnosis, and plan of care documented in the resident's note.  

## 2021-11-16 ENCOUNTER — Ambulatory Visit
Admission: RE | Admit: 2021-11-16 | Discharge: 2021-11-16 | Disposition: A | Discharge status: Home / Self Care | Payer: PPO | Source: Ambulatory Visit | Attending: Internal Medicine | Admitting: Internal Medicine

## 2021-11-16 DIAGNOSIS — Z1231 Encounter for screening mammogram for malignant neoplasm of breast: Secondary | ICD-10-CM

## 2021-11-24 ENCOUNTER — Ambulatory Visit (HOSPITAL_COMMUNITY)
Admission: RE | Admit: 2021-11-24 | Discharge: 2021-11-24 | Disposition: A | Discharge status: Home / Self Care | Payer: PPO | Source: Ambulatory Visit | Attending: Internal Medicine | Admitting: Internal Medicine

## 2021-11-24 DIAGNOSIS — N390 Urinary tract infection, site not specified: Secondary | ICD-10-CM | POA: Diagnosis present

## 2021-11-24 DIAGNOSIS — K5730 Diverticulosis of large intestine without perforation or abscess without bleeding: Secondary | ICD-10-CM | POA: Insufficient documentation

## 2021-11-24 DIAGNOSIS — R109 Unspecified abdominal pain: Secondary | ICD-10-CM | POA: Diagnosis present

## 2022-01-24 ENCOUNTER — Encounter (HOSPITAL_COMMUNITY): Payer: Self-pay | Admitting: Emergency Medicine

## 2022-01-24 ENCOUNTER — Emergency Department (HOSPITAL_COMMUNITY): Payer: PPO

## 2022-01-24 ENCOUNTER — Emergency Department (HOSPITAL_COMMUNITY)
Admission: EM | Admit: 2022-01-24 | Discharge: 2022-01-25 | Disposition: A | Discharge status: Home / Self Care | Payer: PPO | Attending: Emergency Medicine | Admitting: Emergency Medicine

## 2022-01-24 ENCOUNTER — Other Ambulatory Visit: Payer: Self-pay

## 2022-01-24 DIAGNOSIS — R079 Chest pain, unspecified: Secondary | ICD-10-CM | POA: Diagnosis present

## 2022-01-24 DIAGNOSIS — Z91040 Latex allergy status: Secondary | ICD-10-CM | POA: Insufficient documentation

## 2022-01-24 LAB — BASIC METABOLIC PANEL
Anion gap: 7 (ref 5–15)
BUN: 10 mg/dL (ref 6–20)
CO2: 27 mmol/L (ref 22–32)
Calcium: 9.2 mg/dL (ref 8.9–10.3)
Chloride: 105 mmol/L (ref 98–111)
Creatinine, Ser: 0.88 mg/dL (ref 0.44–1.00)
GFR, Estimated: >60 mL/min (ref >60)
Glucose, Bld: 99 mg/dL (ref 70–99)
Potassium: 3.6 mmol/L (ref 3.5–5.1)
Sodium: 139 mmol/L (ref 135–145)

## 2022-01-24 LAB — CBC
HCT: 44.1 % (ref 36.0–46.0)
Hemoglobin: 14.4 g/dL (ref 12.0–15.0)
MCH: 29.4 pg (ref 26.0–34.0)
MCHC: 32.7 g/dL (ref 30.0–36.0)
MCV: 90.2 fL (ref 80.0–100.0)
Platelets: 237 10*3/uL (ref 150–400)
RBC: 4.89 MIL/uL (ref 3.87–5.11)
RDW: 13.4 % (ref 11.5–15.5)
WBC: 9.4 10*3/uL (ref 4.0–10.5)
nRBC: 0 % (ref 0.0–0.2)

## 2022-01-24 LAB — TROPONIN I (HIGH SENSITIVITY): Troponin I (High Sensitivity): 3 ng/L (ref <18)

## 2022-01-24 NOTE — ED Provider Triage Note (Signed)
Emergency Medicine Provider Triage Evaluation Note  Brandi Chase , a 56 y.o. female  was evaluated in triage.  Pt complains of CP. Pain to L side of chest, L arm and L upper abd x 2 days.  No fever, chills, cough, sob, nausea, vomiting, lighthead or diaphoresis  Review of Systems  Positive: As above Negative: As above  Physical Exam  BP (!) 173/98 (BP Location: Right Arm)   Pulse 81   Temp 97.8 F (36.6 C) (Oral)   Resp 16   Ht '5\' 2"'$  (1.575 m)   Wt 100.2 kg   LMP 08/17/2019 (Exact Date)   SpO2 100%   BMI 40.42 kg/m  Gen:   Awake, no distress   Resp:  Normal effort  MSK:   Moves extremities without difficulty  Other:    Medical Decision Making  Medically screening exam initiated at 9:13 PM.  Appropriate orders placed.  Brandi Chase was informed that the remainder of the evaluation will be completed by another provider, this initial triage assessment does not replace that evaluation, and the importance of remaining in the ED until their evaluation is complete.     Domenic Moras, PA-C 01/24/22 2116

## 2022-01-24 NOTE — ED Triage Notes (Signed)
Pt reports left sided chest pain starting Friday. Hx of same but states it usually only lasts a day, states it happens when she sleeps on her right side.  Denies any other symptoms other than abdominal pain stating "I'm hoping its gas."

## 2022-01-25 LAB — TROPONIN I (HIGH SENSITIVITY): Troponin I (High Sensitivity): 3 ng/L (ref <18)

## 2022-01-25 MED ORDER — FAMOTIDINE 20 MG PO TABS
20.0000 mg | ORAL_TABLET | Freq: Once | ORAL | Status: AC
  Administered 2022-01-25: 20 mg via ORAL
  Filled 2022-01-25: qty 1

## 2022-01-25 MED ORDER — FAMOTIDINE 20 MG PO TABS: 20.0000 mg | ORAL_TABLET | Freq: Every day | ORAL | 0 refills | Status: DC

## 2022-01-25 NOTE — ED Provider Notes (Signed)
[facility] EMERGENCY DEPARTMENT Provider Note   CSN: [id number] Arrival date & time: 01/24/22  2037     History  Chief Complaint  Patient presents with   Chest Pain    Brandi Chase is a 56 y.o. female.  Patient is a 56 year old female with no significant past medical history presenting to the emergency department with chest pain.  Patient states that she has had a burning type of pain on the left side of her chest since she woke up on Friday.  She states the pain has been constant.  She states it is not affected by eating or exertion.  She denies any fevers or chills, cough or shortness of breath.  She denies any nausea, vomiting or diarrhea.  She states that she has had this pain intermittently when she sleeps on her right side but she has never had it last this long.  She states that she has also had intermittent pain with her menstrual cycles which she just recently started but again is never lasted this long.  The history is provided by the patient.  Chest Pain      Home Medications Prior to Admission medications   Medication Sig Start Date End Date Taking? Authorizing Provider  famotidine (PEPCID) 20 MG tablet Take 1 tablet (20 mg total) by mouth daily. 01/25/22  Yes Maylon Peppers, [place] K, DO  ibuprofen (ADVIL) 200 MG tablet Take 600 mg by mouth every 6 (six) hours as needed.    [provider]      Allergies    Other, Primatene mist [epinephrine], Shrimp [shellfish allergy], Aleve [naproxen sodium], Bactrim ds [sulfamethoxazole w/trimethoprim (co-trimoxazole)], Midol [ibuprofen], Tylenol [acetaminophen], Avocado, Cocoa butter, Diflucan [fluconazole], and Latex    Review of Systems   Review of Systems  Cardiovascular:  Positive for chest pain.    Physical Exam Updated Vital Signs BP (!) 143/79   Pulse 71   Temp 98.3 F (36.8 C) (Oral)   Resp 20   Ht '5\' 2"'$  (1.575 m)   Wt 100.2 kg   LMP 08/17/2019 (Exact Date)   SpO2 100%   BMI  40.42 kg/m  Physical Exam Vitals and nursing note reviewed.  Constitutional:      General: She is not in acute distress.    Appearance: She is well-developed. She is obese.  HENT:     Head: Normocephalic and atraumatic.  Eyes:     Extraocular Movements: Extraocular movements intact.  Cardiovascular:     Rate and Rhythm: Normal rate and regular rhythm.     Pulses:          Radial pulses are 2+ on the right side and 2+ on the left side.       Dorsalis pedis pulses are 2+ on the right side and 2+ on the left side.     Heart sounds: Normal heart sounds.  Pulmonary:     Effort: Pulmonary effort is normal.     Breath sounds: Normal breath sounds.  Chest:     Chest wall: No tenderness.  Abdominal:     Palpations: Abdomen is soft.     Tenderness: There is no abdominal tenderness.  Musculoskeletal:        General: Normal range of motion.     Cervical back: Normal range of motion and neck supple.     Right lower leg: No edema.     Left lower leg: No edema.  Skin:    General: Skin is warm and dry.  Neurological:     General: No focal deficit present.     Mental Status: She is alert and oriented to person, place, and time.     Motor: No weakness.  Psychiatric:        Mood and Affect: Mood normal.        Behavior: Behavior normal.     ED Results / Procedures / Treatments   Labs (all labs ordered are listed, but only abnormal results are displayed) Labs Reviewed  BASIC METABOLIC PANEL  CBC  TROPONIN I (HIGH SENSITIVITY)  TROPONIN I (HIGH SENSITIVITY)    EKG EKG Interpretation  Date/Time:  Sunday January 24 2022 20:52:14 EDT Ventricular Rate:  81 PR Interval:  146 QRS Duration: 74 QT Interval:  400 QTC Calculation: 464 R Axis:   15 Text Interpretation: Normal sinus rhythm Nonspecific ST and T wave abnormality Prolonged QT Abnormal ECG No significant change since last tracing Confirmed by Leanord Asal ([id number]) on 01/25/2022 9:02:34 AM  Radiology DG Chest 2  View  Result Date: 01/24/2022 CLINICAL DATA:  Chest pain EXAM: CHEST - 2 VIEW COMPARISON:  06/17/2018 FINDINGS: Lungs are clear.  No pleural effusion or pneumothorax. Heart is normal in size. Visualized osseous structures are within normal limits. Lumbar spine fixation hardware, incompletely visualized. IMPRESSION: Normal chest radiographs. Electronically Signed   By: Julian Hy M.D.   On: 01/24/2022 21:38    Procedures Procedures    Medications Ordered in ED Medications  famotidine (PEPCID) tablet 20 mg (20 mg Oral Given 01/25/22 0914)    ED Course/ Medical Decision Making/ A&P                           Medical Decision Making This patient presents to the ED with chief complaint(s) of chest pain with no pertinent past medical history which further complicates the presenting complaint. The complaint involves an extensive differential diagnosis and also carries with it a high risk of complications and morbidity.    The differential diagnosis includes ACS, arrhythmia, anemia, pneumonia, pleural effusion, pneumothorax, pulmonary edema, gastritis, GERD, patient has no abdominal tenderness making pancreatitis or hepatitis unlikely, has no chest wall tenderness making costochondritis or muscle strain unlikely, has no radiation of pain and equal pulses in all 4 extremities making dissection unlikely  Additional history obtained: Additional history obtained from family Records reviewed Primary Care Documents  ED Course and Reassessment: Patient was initially evaluated by provider in triage and had labs, chest x-ray and EKG performed.  Labs are within normal range including troponin x2 and EKG is without acute ischemic changes.  Chest x-ray is with no acute disease.  Patient's burning type of pain may be acid reflux or gastritis related and will be trialed on Pepcid.  She is stable for discharge with primary care follow-up and was given strict return precautions.  Independent labs  interpretation:  The following labs were independently interpreted: Within normal range  Independent visualization of imaging: - I independently visualized the following imaging with scope of interpretation limited to determining acute life threatening conditions related to emergency care: Chest x-ray, which revealed no acute disease  Consultation: - Consulted or discussed management/test interpretation w/ external professional: N/A  Consideration for admission or further workup: Patient has no emergent conditions requiring admission at this time and is stable for discharge with primary care follow-up Social Determinants of health: N/A            Final Clinical Impression(s) / ED Diagnoses  Final diagnoses:  Nonspecific chest pain    Rx / DC Orders ED Discharge Orders          Ordered    famotidine (PEPCID) 20 MG tablet  Daily        01/25/22 0916              Kemper Durie, DO 01/25/22 [phone]

## 2022-01-25 NOTE — Discharge Instructions (Addendum)
You were seen in the emergency department for your chest pain.  Your work-up showed no signs of heart attack, abnormal heart rhythm, infection or anemia.  It is unclear what is causing your chest pain at this time but could be related to acid reflux.  I have given you an antacid and you should take this every day for at least the next two weeks to see if it helps with your symptoms. You should follow up with your primary care doctor in the next few days to have your symptoms rechecked. You should return to the emergency department if your pain gets significantly worse, you have severe shortness of breath, you pass out or if you have any other new or concerning symptoms.

## 2022-01-26 ENCOUNTER — Telehealth: Payer: Self-pay

## 2022-01-26 NOTE — Patient Outreach (Signed)
  Care Coordination TOC Note Transition Care Management Follow-up Telephone Call Date of discharge and from where: [facility] 01/25/22 How have you been since you were released from the hospital? "I am feeling better, taking it easy" Any questions or concerns? No  Items Reviewed: Did the pt receive and understand the discharge instructions provided? Yes  Medications obtained and verified? Yes  Other? No  Any new allergies since your discharge? No  Dietary orders reviewed? No Do you have support at home? Yes   Home Care and Equipment/Supplies: Were home health services ordered? no If so, what is the name of the agency? N/A  Has the agency set up a time to come to the patient's home? not applicable Were any new equipment or medical supplies ordered?  No What is the name of the medical supply agency? N/A Were you able to get the supplies/equipment? not applicable Do you have any questions related to the use of the equipment or supplies? No  Functional Questionnaire: (I = Independent and D = Dependent) ADLs: N/A  Bathing/Dressing- N/A  Meal Prep- N/A  Eating- N/A  Maintaining continence- N/A  Transferring/Ambulation- N/A  Managing Meds- N/A  Follow up appointments reviewed:  PCP Hospital f/u appt confirmed? No   Specialist Hospital f/u appt confirmed? No   Are transportation arrangements needed? No  If their condition worsens, is the pt aware to call PCP or go to the Emergency Dept.? Yes Was the patient provided with contact information for the PCP's office or ED? Yes Was to pt encouraged to call back with questions or concerns? Yes  SDOH assessments and interventions completed:   Yes  Care Coordination Interventions Activated:  Yes   Care Coordination Interventions:  PCP follow up appointment requested   Encounter Outcome:  Pt. Visit Completed

## 2022-02-09 ENCOUNTER — Encounter: Payer: PPO | Admitting: Student

## 2022-02-11 ENCOUNTER — Encounter: Payer: Self-pay | Admitting: Student

## 2022-02-11 ENCOUNTER — Other Ambulatory Visit: Payer: Self-pay

## 2022-02-11 ENCOUNTER — Ambulatory Visit (INDEPENDENT_AMBULATORY_CARE_PROVIDER_SITE_OTHER): Payer: PPO | Admitting: Student

## 2022-02-11 VITALS — BP 137/74 | HR 69 | Temp 98.2°F | Ht 62.0 in | Wt 219.8 lb

## 2022-02-11 DIAGNOSIS — R7303 Prediabetes: Secondary | ICD-10-CM | POA: Diagnosis not present

## 2022-02-11 DIAGNOSIS — R0789 Other chest pain: Secondary | ICD-10-CM

## 2022-02-11 LAB — GLUCOSE, CAPILLARY: Glucose-Capillary: 71 mg/dL (ref 70–99)

## 2022-02-11 LAB — POCT GLYCOSYLATED HEMOGLOBIN (HGB A1C): Hemoglobin A1C: 5.9 % — AB (ref 4.0–5.6)

## 2022-02-11 NOTE — Patient Instructions (Signed)
Brandi Chase, it was a pleasure seeing you today!  Today we discussed: - I have placed referral for cardiology.  - I would also like to check your A1c today, I will call you with the results.   I have ordered the following labs today:   Lab Orders         POC Hbg A1C      Follow-up: 6 months   Please make sure to arrive 15 minutes prior to your next appointment. If you arrive late, you may be asked to reschedule.   We look forward to seeing you next time. Please call our clinic at [phone] if you have any questions or concerns. The best time to call is Monday-Friday from 9am-4pm, but there is someone available 24/7. If after hours or the weekend, call the main hospital number and ask for the Internal Medicine Resident On-Call. If you need medication refills, please notify your pharmacy one week in advance and they will send [place] a request.  Thank you for letting us take part in your care. Wishing you the best!  Thank you, Sanjuan Dame, MD

## 2022-02-15 NOTE — Progress Notes (Signed)
   CC: chest pain  HPI:  Ms.Sinclair Viona Gilmore Roland is a 56 y.o. person with medical history as below presenting to [facility] for chest pain  Please see problem-based list for further details, assessments, and plans.  Past Medical History:  Diagnosis Date   Adnexal cyst 2011   removed   Anemia    Arthritis    "neck; spine" (08/29/2014)   Asthma    Carpal tunnel syndrome of left wrist    Chronic lower back pain    Complication of anesthesia    difficult to awake   Daily headache 12/2013   "tx'd and no problems anymore" (08/29/2014)   Dyspnea    with asthma attack- none recent   Family history of adverse reaction to anesthesia    "1 sister and both parents hard to wake up"   Heart murmur    "dx'd when I was a teenager; haven't had any problems w/it"   Hip pain, right    History of bronchitis    Hypertension    Urinary incontinence    Urinary urgency    UTI (lower urinary tract infection)    Review of Systems:  As per HPI  Physical Exam:  Vitals:   02/11/22 1540  BP: 137/74  Pulse: 69  Temp: 98.2 F (36.8 C)  TempSrc: Oral  SpO2: 100%  Weight: 219 lb 12.8 oz (99.7 kg)  Height: '5\' 2"'$  (1.575 m)   General: Resting comfortably in no acute distress.  CV: Regular rate, rhythm. No murmurs appreciated. Warm extremities. Pulm: Normal work of breathing on room air. Clear to auscultation bilaterally.  MSK: Normal bulk, tone. No peripheral edema noted. No chest pain upon palpation. Skin: Warm, dry. No rashes or lesions appreciated. Neuro: Awake, alert, conversing appropriately.   Assessment & Plan:   Atypical chest pain Ms. Kashani is returning to clinic to discuss recent chest pain she has been experiencing. She was recently seen in the ED for this issue on 10/15. At the time was prescribed famotidine for GERD and discharged. Since that time, Ms. Berte reports no improvement of her symptoms. Describes mid-sternal/left-sided pain that typically is constant when she has her  menstrual cycles. Does not report any exacerbation with exertion or with food. She has had this for a few years now, but her symptoms have worsened recently.   Discussed with Ms. Nims that her symptoms are atypical for coronary artery disease. However, given her age and risk factors would be feasible to refer to cardiology for further testing. Patient is in agreement with plan. Will re-check A1c today for risk factor modification.  - Repeat A1c today - Pending cardiology referral   Patient discussed with Dr.  Garnet Koyanagi, MD Internal Medicine PGY-3 Pager: [phone]

## 2022-02-15 NOTE — Assessment & Plan Note (Signed)
Brandi Chase is returning to clinic to discuss recent chest pain she has been experiencing. She was recently seen in the ED for this issue on 10/15. At the time was prescribed famotidine for GERD and discharged. Since that time, Brandi Chase reports no improvement of her symptoms. Describes mid-sternal/left-sided pain that typically is constant when she has her menstrual cycles. Does not report any exacerbation with exertion or with food. She has had this for a few years now, but her symptoms have worsened recently.   Discussed with Brandi Chase that her symptoms are atypical for coronary artery disease. However, given her age and risk factors would be feasible to refer to cardiology for further testing. Patient is in agreement with plan. Will re-check A1c today for risk factor modification.  - Repeat A1c today - Pending cardiology referral

## 2022-02-19 ENCOUNTER — Telehealth: Payer: Self-pay

## 2022-02-19 NOTE — Telephone Encounter (Signed)
   Reason for call: ED-Follow up call  Patient visit on 01/25/2022 at [facility] was for Chest Pain  Have you been able to follow up with your primary care physician? - Yes  The patient was or was not able to obtain any needed medicine or equipment. - Was, Yes  Are there diet recommendations that you are having difficulty following? - No  Patient expresses understanding of discharge instructions and education provided has no other needs at this time.   [facility] management  [place], [place]  Main Phone: [phone]  E-mail: [email or web address]  Website: [email or web address]

## 2022-02-23 NOTE — Progress Notes (Signed)
Internal Medicine Clinic Attending  Case discussed with Dr. Collene Gobble  At the time of the visit.  We reviewed the resident's history and exam and pertinent patient test results.  I agree with the assessment, diagnosis, and plan of care documented in the resident's note. Troponin negative and non-specific changes on EKG while in the ED.

## 2022-03-16 NOTE — Progress Notes (Unsigned)
Cardiology Office Note:    Date:  03/17/2022   ID:  Brandi Chase, DOB [birth year], MRN [id number]  PCP:  Brandi Dame, MD  Cardiologist:  None   Referring MD: Brandi Dame, MD   Chief Complaint  Patient presents with   Chest Pain    History of Present Illness:    Brandi Chase is a 56 y.o. female with a hx of chest pain who is referred by the internal medicine clinic for evaluation.  Pertinent medical history includes dyspnea on exertion, history of asthma, remote history of heart murmur as a youth, hypertension, and family history of hypertension and pancreatic cancer.  She is here for evaluation of recurring chest pain.  This has been a problem off and on for greater than 10 years.  The discomfort is in the left precordial area.  She describes it as a tightness or fullness.  Episodes occur spontaneously.  She has much more trouble with the discomfort during her menstrual cycle.  There is not a particular exertional component.  She is concerned that we have not identified the problem and that it could be cardiac.  Past Medical History:  Diagnosis Date   Adnexal cyst 2011   removed   Anemia    Arthritis    "neck; spine" (08/29/2014)   Asthma    Carpal tunnel syndrome of left wrist    Chronic lower back pain    Complication of anesthesia    difficult to awake   Daily headache 12/2013   "tx'd and no problems anymore" (08/29/2014)   Dyspnea    with asthma attack- none recent   Family history of adverse reaction to anesthesia    "1 sister and both parents hard to wake up"   Heart murmur    "dx'd when I was a teenager; haven't had any problems w/it"   Hip pain, right    History of bronchitis    Hypertension    Urinary incontinence    Urinary urgency    UTI (lower urinary tract infection)     Past Surgical History:  Procedure Laterality Date   BACK SURGERY     x2   JOINT REPLACEMENT Right 11/2015   hip   OVARIAN CYST REMOVAL Right 2011    REDUCTION MAMMAPLASTY Bilateral 1995   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   Procedure: TOTAL HIP ARTHROPLASTY ANTERIOR APPROACH;  Surgeon: Brandi Pear, MD;  Location: [facility];  Service: Orthopedics;  Laterality: Right;   TOTAL HIP ARTHROPLASTY Left 07/19/2016   Procedure: TOTAL HIP ARTHROPLASTY ANTERIOR APPROACH;  Surgeon: Brandi Pear, MD;  Location: [facility];  Service: Orthopedics;  Laterality: Left;   TRANSFORAMINAL LUMBAR INTERBODY FUSION (TLIF) WITH PEDICLE SCREW FIXATION 2 LEVEL N/A 08/29/2014   Procedure: TRANSFORAMINAL LUMBAR INTERBODY FUSION (TLIF) L4-S1;  Surgeon: Brandi Schools, MD;  Location: [facility];  Service: Orthopedics;  Laterality: N/A;   TUBAL LIGATION  1992    Current Medications: Current Meds  Medication Sig   ibuprofen (ADVIL) 200 MG tablet Take 600 mg by mouth every 6 (six) hours as needed.   metoprolol tartrate (LOPRESSOR) 100 MG tablet Take 1 tablet (100 mg total) by mouth once for 1 dose. Take 90-120 minutes prior to scan.     Allergies:   Other, Primatene mist [epinephrine], Shrimp [shellfish allergy], Aleve [naproxen sodium], Bactrim ds [sulfamethoxazole w/trimethoprim (co-trimoxazole)], Midol [ibuprofen], Tylenol [acetaminophen], Avocado, Cocoa butter, Diflucan [fluconazole], and Latex   Social History   Socioeconomic History  Marital status: Single    Spouse name: Not on file   Number of children: 3   Years of education: Not on file   Highest education level: Not on file  Occupational History   Occupation: Disabled   Occupation: CNA  Tobacco Use   Smoking status: Never   Smokeless tobacco: Never  Vaping Use   Vaping Use: Never used  Substance and Sexual Activity   Alcohol use: No   Drug use: No   Sexual activity: Yes    Birth control/protection: Surgical  Other Topics Concern   Not on file  Social History Narrative   Current Social History 01/03/2019        Patient lives with daughter and 56 yo grandson in a  second floor apartment. There are 14 steps with handrails up to the entrance the patient uses.       Patient's method of transportation is personal car.      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      The patient currently disabled.      Identified important Relationships are "My brother's girlfriend, they've been together 20 years."       Pets : None       Interests / Fun: "[place], reading"      Current Stressors: "Nothing really. I try not to let things stress me."       Religious / Personal Beliefs: "Christian"       L. Silvano Rusk, RN, BSN       Social Determinants of Health   Financial Resource Strain: Medium Risk (01/26/2022)   Overall Financial Resource Strain (CARDIA)    Difficulty of Paying Living Expenses: Somewhat hard  Food Insecurity: Not on file  Transportation Needs: No Transportation Needs (01/26/2022)   PRAPARE - Hydrologist (Medical): No    Lack of Transportation (Non-Medical): No  Physical Activity: Not on file  Stress: Not on file  Social Connections: Not on file     Family History: The patient's family history includes Allergic rhinitis in her father; Arthritis in her mother and sister; Asthma in her daughter and father; Cancer in her father and mother; Hypertension in her father and mother; Narcolepsy in her daughter; Pancreatic cancer in her mother; Prostate cancer in her father; Scoliosis in her sister; Sleep apnea in her daughter. There is no history of Colon cancer, Liver cancer, Stomach cancer, Rectal cancer, or Esophageal cancer.  ROS:   Please see the history of present illness.    She is overweight.  She does not smoke, is not diabetic, and no history of hypertension.  She snores and states that she occasionally awakens herself.  Her mother had a heart problem but did not discuss the nature of that with her.  All other systems reviewed and are negative.  EKGs/Labs/Other  Studies Reviewed:    The following studies were reviewed today: No prior cardiac evaluation.  EKG:  EKG normal sinus rhythm with nonspecific T wave flattening on EKG 01/25/2022.  Recent Labs: 01/24/2022: BUN 10; Creatinine, Ser 0.88; Hemoglobin 14.4; Platelets 237; Potassium 3.6; Sodium 139  Recent Lipid Panel    Component Value Date/Time   CHOL 197 12/08/2020 1150   TRIG 70 12/08/2020 1150   HDL 55 12/08/2020 1150   CHOLHDL 3.6 12/08/2020 1150   CHOLHDL 3.3 Ratio 03/05/2009 1946   VLDL 16 03/05/2009 1946   LDLCALC 129 (H) 12/08/2020 1150    Physical  Exam:    VS:  BP (!) 142/84   Pulse 83   Ht '5\' 2"'$  (1.575 m)   Wt 219 lb 9.6 oz (99.6 kg)   LMP 08/17/2019 (Exact Date)   SpO2 96%   BMI 40.17 kg/m     Wt Readings from Last 3 Encounters:  03/17/22 219 lb 9.6 oz (99.6 kg)  02/11/22 219 lb 12.8 oz (99.7 kg)  01/24/22 221 lb (100.2 kg)     GEN: Obese with BMI greater than 40. No acute distress HEENT: Normal NECK: No JVD. LYMPHATICS: No lymphadenopathy CARDIAC: Soft left parasternal systolic murmur. RRR no gallop, or edema. VASCULAR:  Normal Pulses. No bruits. RESPIRATORY:  Clear to auscultation without rales, wheezing or rhonchi  ABDOMEN: Soft, non-tender, non-distended, No pulsatile mass, MUSCULOSKELETAL: No deformity  SKIN: Warm and dry NEUROLOGIC:  Alert and oriented x 3 PSYCHIATRIC:  Normal affect   ASSESSMENT:    1. Chest pain of uncertain etiology   2. Mixed hyperlipidemia   3. Essential hypertension   4. Pre-procedure lab exam    PLAN:    In order of problems listed above:  Catamenial chest pain could have a microvascular basis.  Therefore, will perform coronary CTA to exclude epicardial atherosclerotic disease.  If this study is normal as I suspect it may be, with then consider a PET perfusion study to see if myocardial blood flow reserve is decreased.  If that is the case then she would need cardiac rehab, ARB therapy, statin therapy, and close clinical  follow-up.  If she has atherosclerosis and ends up having coronary angiography, if there is no significant obstructive disease, a CMD study should be performed to determine her microvascular endotype which may help to guide therapy.   Medication Adjustments/Labs and Tests Ordered: Current medicines are reviewed at length with the patient today.  Concerns regarding medicines are outlined above.  Orders Placed This Encounter  Procedures   CT CORONARY MORPH W/CTA COR W/SCORE W/CA W/CM &/OR WO/CM   Basic metabolic panel   Meds ordered this encounter  Medications   metoprolol tartrate (LOPRESSOR) 100 MG tablet    Sig: Take 1 tablet (100 mg total) by mouth once for 1 dose. Take 90-120 minutes prior to scan.    Dispense:  1 tablet    Refill:  0    Patient Instructions  Medication Instructions:  Your physician recommends that you continue on your current medications as directed. Please refer to the Current Medication list given to you today.  *If you need a refill on your cardiac medications before your next appointment, please call your pharmacy*  Lab Work: TODAY: BMET If you have labs (blood work) drawn today and your tests are completely normal, you will receive your results only by: [place] (if you have MyChart) OR A paper copy in the mail If you have any lab test that is abnormal or we need to change your treatment, we will call you to review the results.  Testing/Procedures: Your physician has requested that you have a coronary CTA performed. The scheduler from [facility] will call you to schedule your appointment for this CT scan.  Follow-Up: Will be determined based on results of coronary CTA.  Other Instructions   Your cardiac CT will be scheduled at:   [facility] [address] [place], [place] [address] [phone]  Please arrive at the [facility] and Children's Entrance (Entrance C2) of [facility] 30 minutes prior to test start  time. You  can use the FREE valet parking offered at entrance C (encouraged to control the heart rate for the test)  Proceed to the [facility] Radiology Department (first floor) to check-in and test prep.  All radiology patients and guests should use entrance C2 at [facility], accessed from [facility], even though the hospital's physical address listed is [address].    Please follow these instructions carefully (unless otherwise directed):  On the Night Before the Test: Be sure to Drink plenty of water. Do not consume any caffeinated/decaffeinated beverages or chocolate 12 hours prior to your test. Do not take any antihistamines 12 hours prior to your test.  On the Day of the Test: Drink plenty of water until 1 hour prior to the test. Do not eat any food 1 hour prior to test. You may take your regular medications prior to the test.  Take metoprolol (Lopressor) '100mg'$  two hours prior to test. FEMALES- please wear underwire-free bra if available, avoid dresses & tight clothing  After the Test: Drink plenty of water. After receiving IV contrast, you may experience a mild flushed feeling. This is normal. On occasion, you may experience a mild rash up to 24 hours after the test. This is not dangerous. If this occurs, you can take Benadryl 25 mg and increase your fluid intake. If you experience trouble breathing, this can be serious. If it is severe call 911 IMMEDIATELY. If it is mild, please call our office. If you take any of these medications: Glipizide/Metformin, Avandament, Glucavance, please do not take 48 hours after completing test unless otherwise instructed.  We will call to schedule your test 2-4 weeks out understanding that some insurance companies will need an authorization prior to the service being performed.   For non-scheduling related questions, please contact the cardiac imaging nurse navigator should you have any questions/concerns: Marchia Bond, Cardiac Imaging Nurse Navigator Gordy Clement, Cardiac Imaging Nurse Navigator [facility] Direct Office Dial: [phone]   For scheduling needs, including cancellations and rescheduling, please call [place], [phone].   Important Information About Sugar         Signed, Sinclair Grooms, MD  03/17/2022 2:44 PM    [facility]

## 2022-03-17 ENCOUNTER — Ambulatory Visit: Payer: PPO | Attending: Interventional Cardiology | Admitting: Interventional Cardiology

## 2022-03-17 ENCOUNTER — Encounter: Payer: Self-pay | Admitting: Interventional Cardiology

## 2022-03-17 VITALS — BP 142/84 | HR 83 | Ht 62.0 in | Wt 219.6 lb

## 2022-03-17 DIAGNOSIS — Z01812 Encounter for preprocedural laboratory examination: Secondary | ICD-10-CM

## 2022-03-17 DIAGNOSIS — E782 Mixed hyperlipidemia: Secondary | ICD-10-CM | POA: Diagnosis not present

## 2022-03-17 DIAGNOSIS — R079 Chest pain, unspecified: Secondary | ICD-10-CM

## 2022-03-17 DIAGNOSIS — I10 Essential (primary) hypertension: Secondary | ICD-10-CM | POA: Diagnosis not present

## 2022-03-17 MED ORDER — METOPROLOL TARTRATE 100 MG PO TABS: 100.0000 mg | ORAL_TABLET | Freq: Once | ORAL | 0 refills | Status: DC

## 2022-03-17 NOTE — Patient Instructions (Signed)
Medication Instructions:  Your physician recommends that you continue on your current medications as directed. Please refer to the Current Medication list given to you today.  *If you need a refill on your cardiac medications before your next appointment, please call your pharmacy*  Lab Work: TODAY: BMET If you have labs (blood work) drawn today and your tests are completely normal, you will receive your results only by: [place] (if you have MyChart) OR A paper copy in the mail If you have any lab test that is abnormal or we need to change your treatment, we will call you to review the results.  Testing/Procedures: Your physician has requested that you have a coronary CTA performed. The scheduler from [facility] will call you to schedule your appointment for this CT scan.  Follow-Up: Will be determined based on results of coronary CTA.  Other Instructions   Your cardiac CT will be scheduled at:   [facility] [address] [place], [place] [address] [phone]  Please arrive at the [facility] and Children's Entrance (Entrance C2) of [facility] 30 minutes prior to test start time. You can use the FREE valet parking offered at entrance C (encouraged to control the heart rate for the test)  Proceed to the [facility] Radiology Department (first floor) to check-in and test prep.  All radiology patients and guests should use entrance C2 at [facility], accessed from [facility], even though the hospital's physical address listed is [address]    Please follow these instructions carefully (unless otherwise directed):  On the Night Before the Test: Be sure to Drink plenty of water. Do not consume any caffeinated/decaffeinated beverages or chocolate 12 hours prior to your test. Do not take any antihistamines 12 hours prior to your test.  On the Day of the Test: Drink plenty of water until 1 hour prior to the  test. Do not eat any food 1 hour prior to test. You may take your regular medications prior to the test.  Take metoprolol (Lopressor) '100mg'$  two hours prior to test. FEMALES- please wear underwire-free bra if available, avoid dresses & tight clothing  After the Test: Drink plenty of water. After receiving IV contrast, you may experience a mild flushed feeling. This is normal. On occasion, you may experience a mild rash up to 24 hours after the test. This is not dangerous. If this occurs, you can take Benadryl 25 mg and increase your fluid intake. If you experience trouble breathing, this can be serious. If it is severe call 911 IMMEDIATELY. If it is mild, please call our office. If you take any of these medications: Glipizide/Metformin, Avandament, Glucavance, please do not take 48 hours after completing test unless otherwise instructed.  We will call to schedule your test 2-4 weeks out understanding that some insurance companies will need an authorization prior to the service being performed.   For non-scheduling related questions, please contact the cardiac imaging nurse navigator should you have any questions/concerns: Marchia Bond, Cardiac Imaging Nurse Navigator Gordy Clement, Cardiac Imaging Nurse Navigator [facility] Direct Office Dial: [phone]   For scheduling needs, including cancellations and rescheduling, please call [place], [phone].   Important Information About Sugar

## 2022-03-18 LAB — BASIC METABOLIC PANEL
BUN/Creatinine Ratio: 12 (ref 9–23)
BUN: 12 mg/dL (ref 6–24)
CO2: 26 mmol/L (ref 20–29)
Calcium: 9.5 mg/dL (ref 8.7–10.2)
Chloride: 105 mmol/L (ref 96–106)
Creatinine, Ser: 0.99 mg/dL (ref 0.57–1.00)
Glucose: 109 mg/dL — ABNORMAL HIGH (ref 70–99)
Potassium: 3.9 mmol/L (ref 3.5–5.2)
Sodium: 144 mmol/L (ref 134–144)
eGFR: 67 mL/min/{1.73_m2} (ref >59)

## 2022-03-24 ENCOUNTER — Telehealth (HOSPITAL_COMMUNITY): Payer: Self-pay | Admitting: Emergency Medicine

## 2022-03-24 ENCOUNTER — Telehealth: Payer: Self-pay | Admitting: *Deleted

## 2022-03-24 NOTE — Telephone Encounter (Signed)
Attempted to call patient regarding upcoming cardiac CT appointment. °Left message on voicemail with name and callback number °Santia Labate RN Navigator Cardiac Imaging °[facility] °[phone] Office °[phone] Cell ° °

## 2022-03-24 NOTE — Telephone Encounter (Signed)
Patient called in stating she is scheduled for a coronary CTA tomorrow and was told to take metoprolol 100 mg 2 hours before procedure. She is wanting to know if this is safe for her to take. Please advise.

## 2022-03-24 NOTE — Telephone Encounter (Signed)
Reaching out to patient to offer assistance regarding upcoming cardiac imaging study; pt verbalizes understanding of appt date/time, parking situation and where to check in, pre-test NPO status and medications ordered, and verified current allergies; name and call back number provided for further questions should they arise Marchia Bond RN Navigator Cardiac Imaging [facility] [phone] office [phone] cell  Arrival 200 '100mg'$  metoprolol

## 2022-03-24 NOTE — Telephone Encounter (Signed)
Called pt - no answer; left message to call the office . 

## 2022-03-25 ENCOUNTER — Ambulatory Visit (HOSPITAL_COMMUNITY): Admission: RE | Admit: 2022-03-25 | Payer: PPO | Source: Ambulatory Visit

## 2022-05-28 ENCOUNTER — Ambulatory Visit: Payer: 59

## 2022-05-28 ENCOUNTER — Ambulatory Visit (INDEPENDENT_AMBULATORY_CARE_PROVIDER_SITE_OTHER): Payer: 59 | Admitting: Student

## 2022-05-28 VITALS — BP 161/97 | HR 76 | Temp 98.1°F | Ht 62.0 in | Wt 220.9 lb

## 2022-05-28 DIAGNOSIS — I10 Essential (primary) hypertension: Secondary | ICD-10-CM

## 2022-05-28 DIAGNOSIS — R42 Dizziness and giddiness: Secondary | ICD-10-CM

## 2022-05-28 NOTE — Patient Instructions (Signed)
[name], it was a pleasure seeing you today!  Today we discussed: - Lightheadedness: I believe your symptoms could be due to an inner ear issue. It could also be due to stress. I am going to give you exercises to do if these symptoms return.   - I'd like to see you back in one month to discus your blood pressure.   Follow-up:  1 month    Please make sure to arrive 15 minutes prior to your next appointment. If you arrive late, you may be asked to reschedule.   We look forward to seeing you next time. Please call our clinic at [phone] if you have any questions or concerns. The best time to call is Monday-Friday from 9am-4pm, but there is someone available 24/7. If after hours or the weekend, call the main hospital number and ask for the Internal Medicine Resident On-Call. If you need medication refills, please notify your pharmacy one week in advance and they will send [place] a request.  Thank you for letting us take part in your care. Wishing you the best!  Thank you, Sanjuan Dame, MD

## 2022-05-28 NOTE — Progress Notes (Unsigned)
CC: lightheadedness  HPI:  Brandi Chase is a 57 y.o. person with medical history as below presenting to [facility] for lightheadedness.  Please see problem-based list for further details, assessments, and plans.  Past Medical History:  Diagnosis Date   Adnexal cyst 2011   removed   Anemia    Arthritis    "neck; spine" (08/29/2014)   Asthma    Carpal tunnel syndrome of left wrist    Chronic lower back pain    Complication of anesthesia    difficult to awake   Daily headache 12/2013   "tx'd and no problems anymore" (08/29/2014)   Dyspnea    with asthma attack- none recent   Family history of adverse reaction to anesthesia    "1 sister and both parents hard to wake up"   Heart murmur    "dx'd when I was a teenager; haven't had any problems w/it"   Hip pain, right    History of bronchitis    Hypertension    Urinary incontinence    Urinary urgency    UTI (lower urinary tract infection)    Review of Systems:  As per HPI  Physical Exam:  Vitals:   05/28/22 1015 05/28/22 1030 05/28/22 1034 05/28/22 1038  BP: (!) 149/97 (!) 150/101 (!) 151/92 (!) 161/97  Pulse: 78 76 73 76  Temp: 98.1 F (36.7 C)     TempSrc: Oral     SpO2: 98%     Weight: 220 lb 14.4 oz (100.2 kg)     Height: 5' 2"$  (1.575 m)      General: Resting comfortably in no acute distress CV: Regular rate, rhythm. No murmurs appreciated. Warm extremities.  Pulm: Normal work of breathing on room air. Clear to auscultation bilaterally.  MSK: Normal bulk, tone. No peripheral edema noted.  Skin: Warm, dry. No rashes or lesions. Neuro: Awake, alert, conversing appropriately. Cranial nerves in tact. Motor function 5/5 throughout. Sensation in tact throughout. Finger to nose bilaterally in tact.  Psych: Normal mood, affect, speech.   Assessment & Plan:   Episodic lightheadedness Brandi Chase is presenting today for a few episodes of lightheadedness that have occurred over the last few weeks. She describes a  couple of times where she is either laying down or standing when she feels like the room is spinning and she feels lightheaded. Typically these self-resolve within 1-41mn. Some of these episodes have been associated with nausea and typically worsen with movement. She reports she has had adequate PO intake through all of this. Denies any focal weakness, decreased sensation, continual symptoms, difficulty speaking, loss of consciousness, falls, palpitations, chest pain, hearing loss, tinnitus. She does mention significant stressors in her life, including taking care of her 947year old father, having two siblings currently hospitalized, and taking care of her grandchildren.   She is not taking the metoprolol that is listed and is not taking any medications regularly. Denies any other supplements. Orthostatics negative in the clinic today. Dix-Hallpike also negative, she denies any triggering symptoms with the movement. Neurologic exam today normal, no cerebellar or other focal signs. I suspect her symptoms are most likely related to positional vertigo vs stress-induced. I am re-assured with a normal exam and episodic symptoms that this is unlikely stroke-related. I did offer therapy referral for Brandi Chase to help with stress, but she declined today. Discussed with her if these symptoms become persistent or she has syncopal episode to be seen immediately.   - Epley maneuvers given -  Discussed adequate PO intake - Return precautions given  Essential hypertension Elevated blood pressure today - she is not taking any blood pressure medications at this time. Suspect some of this could be stress-related. Will defer starting any medications, especially as she is experiencing some intermittent lightheadedness now. If remains persistently elevated would consider addition of ACE/ARB.  - Consider medications if BP remains elevated  Patient discussed with Dr.  Richrd Sox, MD Internal Medicine  PGY-3 Pager: [phone]

## 2022-05-30 DIAGNOSIS — R42 Dizziness and giddiness: Secondary | ICD-10-CM | POA: Insufficient documentation

## 2022-05-30 NOTE — Addendum Note (Signed)
Addended bySanjuan Dame on: 05/30/2022 01:32 PM   Modules accepted: Orders

## 2022-05-30 NOTE — Assessment & Plan Note (Addendum)
Brandi Chase is presenting today for a few episodes of lightheadedness that have occurred over the last few weeks. She describes a couple of times where she is either laying down or standing when she feels like the room is spinning and she feels lightheaded. Typically these self-resolve within 1-53mn. Some of these episodes have been associated with nausea and typically worsen with movement. She reports she has had adequate PO intake through all of this. Denies any focal weakness, decreased sensation, continual symptoms, difficulty speaking, loss of consciousness, falls, palpitations, chest pain, hearing loss, tinnitus. She does mention significant stressors in her life, including taking care of her 942year old father, having two siblings currently hospitalized, and taking care of her grandchildren.   She is not taking the metoprolol that is listed and is not taking any medications regularly. Denies any other supplements. Orthostatics negative in the clinic today. Dix-Hallpike also negative, she denies any triggering symptoms with the movement. Neurologic exam today normal, no cerebellar or other focal signs. I suspect her symptoms are most likely related to positional vertigo vs stress-induced. I am re-assured with a normal exam and episodic symptoms that this is unlikely stroke-related. I did offer therapy referral for Ms. Grell to help with stress, but she declined today. Discussed with her if these symptoms become persistent or she has syncopal episode to be seen immediately.   - Epley maneuvers given - Discussed adequate PO intake - Return precautions given

## 2022-05-30 NOTE — Assessment & Plan Note (Signed)
Elevated blood pressure today - she is not taking any blood pressure medications at this time. Suspect some of this could be stress-related. Will defer starting any medications, especially as she is experiencing some intermittent lightheadedness now. If remains persistently elevated would consider addition of ACE/ARB.  - Consider medications if BP remains elevated

## 2022-05-31 NOTE — Progress Notes (Signed)
Internal Medicine Clinic Attending ? ?Case discussed with Dr. Braswell  At the time of the visit.  We reviewed the resident?s history and exam and pertinent patient test results.  I agree with the assessment, diagnosis, and plan of care documented in the resident?s note.  ?

## 2022-06-28 ENCOUNTER — Ambulatory Visit: Payer: 59

## 2022-06-28 ENCOUNTER — Ambulatory Visit (INDEPENDENT_AMBULATORY_CARE_PROVIDER_SITE_OTHER): Payer: 59 | Admitting: Student

## 2022-06-28 VITALS — BP 141/86 | HR 77 | Temp 98.2°F | Ht 62.0 in | Wt 223.0 lb

## 2022-06-28 DIAGNOSIS — I10 Essential (primary) hypertension: Secondary | ICD-10-CM

## 2022-06-28 DIAGNOSIS — H9312 Tinnitus, left ear: Secondary | ICD-10-CM | POA: Diagnosis not present

## 2022-06-28 DIAGNOSIS — Z1231 Encounter for screening mammogram for malignant neoplasm of breast: Secondary | ICD-10-CM

## 2022-06-28 NOTE — Progress Notes (Signed)
   CC: Tinnitus   HPI:  Brandi Chase is a 57 y.o. female living with a history stated below and presents today for tinnitus in her left ear. Please see problem based assessment and plan for additional details.  Past Medical History:  Diagnosis Date   Adnexal cyst 2011   removed   Anemia    Arthritis    "neck; spine" (08/29/2014)   Asthma    Carpal tunnel syndrome of left wrist    Chronic lower back pain    Complication of anesthesia    difficult to awake   Daily headache 12/2013   "tx'd and no problems anymore" (08/29/2014)   Dyspnea    with asthma attack- none recent   Family history of adverse reaction to anesthesia    "1 sister and both parents hard to wake up"   Heart murmur    "dx'd when I was a teenager; haven't had any problems w/it"   Hip pain, right    History of bronchitis    Hypertension    Urinary incontinence    Urinary urgency    UTI (lower urinary tract infection)     Current Outpatient Medications on File Prior to Visit  Medication Sig Dispense Refill   ibuprofen (ADVIL) 200 MG tablet Take 600 mg by mouth every 6 (six) hours as needed.     No current facility-administered medications on file prior to visit.    Review of Systems: ROS negative except for what is noted on the assessment and plan.  Vitals:   06/28/22 1415  BP: (!) 141/86  Pulse: 77  Temp: 98.2 F (36.8 C)  TempSrc: Oral  SpO2: 98%  Weight: 223 lb (101.2 kg)  Height: 5\' 2"  (1.575 m)    Physical Exam: Constitutional: well-appearing, in no acute distress HENT: normocephalic atraumatic, left TM cerumen impaction. Resolved after irrigation and normal TM visualized Eyes: conjunctiva non-erythematous Neck: supple Cardiovascular: regular rate and rhythm, no m/r/g Pulmonary/Chest: normal work of breathing on room air Neurological: alert & oriented x 3 Skin: warm and dry Psych: normal mood  Assessment & Plan:   Tinnitus of left ear Assessment: Brandi Chase presents with  buzzing of the left ear for the past few months. Denies history of this. Has had cerumen impactions of the left ear requiring frequent irrigation. Also was seen by ENT for decreased hearing in the ear. Denies dizziness. Lightheadedness episodes from prior visit have resolved.   On exam she has cerumen impaction of the left ear. This was resolved with irrigation, able to visualize TM during my exam. Cerumen impaction likely etiology of tinnitus. Discussed monitoring for resolution with relieving this.   Plan: - continue to monitor for recurrence  Essential hypertension Assessment: Multiple clinic visits with elevated blood pressures and recommend initiating medical therapy. Brandi Chase would like to wait on this, I encouraged her to continue lifestyle modifications of a healthy diet (given packet with DASH diet tips) and exercising as able. We discussed risks and damage that can occur from high blood pressure ( MI, CVA, renal failure)  We also discussed purchasing a home blood pressure cuff and recording multiple times a week. Encouraged her to bring this to the next appointment  Plan: - continue to monitor - follow up home BP readings - discuss further hesitancy to start medical therapy  Patient discussed with Dr. Fanny Bien, D.O. [facility], PGY-3 Phone: [phone] Date 06/30/2022 Time 11:36 AM

## 2022-06-28 NOTE — Patient Instructions (Signed)
Thank you, [name] for allowing [place] to provide your care today. Today we discussed.  Cerumen impaction (ear wax build up) I am hopeful the buzzing in your ear resolves after rinsing your ear out. You can use debrox every other week or once you start feeling like your ear is clogged to help keep the wax loose.   If your symptoms return please call us.   For your blood pressure I recommend a home cuff to see if your blood pressures are elevated at home.   I have ordered the following labs for you:  Lab Orders  No laboratory test(s) ordered today     Referrals ordered today:   Referral Orders  No referral(s) requested today     I have ordered the following medication/changed the following medications:   Stop the following medications: There are no discontinued medications.   Start the following medications: No orders of the defined types were placed in this encounter.    Follow up: 1 month blood pressure follow up    Should you have any questions or concerns please call the internal medicine clinic at [phone].    Sanjuana Letters, D.O. [place]

## 2022-06-28 NOTE — Assessment & Plan Note (Signed)
Assessment: Multiple clinic visits with elevated blood pressures and recommend initiating medical therapy. Brandi Chase would like to wait on this, I encouraged her to continue lifestyle modifications of a healthy diet (given packet with DASH diet tips) and exercising as able. We discussed risks and damage that can occur from high blood pressure ( MI, CVA, renal failure)  We also discussed purchasing a home blood pressure cuff and recording multiple times a week. Encouraged her to bring this to the next appointment  Plan: - continue to monitor - follow up home BP readings - discuss further hesitancy to start medical therapy

## 2022-06-28 NOTE — Assessment & Plan Note (Signed)
Assessment: Brandi Chase presents with buzzing of the left ear for the past few months. Denies history of this. Has had cerumen impactions of the left ear requiring frequent irrigation. Also was seen by ENT for decreased hearing in the ear. Denies dizziness. Lightheadedness episodes from prior visit have resolved.   On exam she has cerumen impaction of the left ear. This was resolved with irrigation, able to visualize TM during my exam. Cerumen impaction likely etiology of tinnitus. Discussed monitoring for resolution with relieving this.   Plan: - continue to monitor for recurrence

## 2022-06-29 NOTE — Addendum Note (Signed)
Addended by: Riesa Pope on: 06/29/2022 09:07 AM   Modules accepted: Orders

## 2022-06-30 NOTE — Progress Notes (Signed)
Internal Medicine Clinic Attending  Case discussed with Dr. Katsadouros at the time of the visit.  We reviewed the resident's history and exam and pertinent patient test results.  I agree with the assessment, diagnosis, and plan of care documented in the resident's note.  

## 2022-07-19 DIAGNOSIS — U071 COVID-19: Secondary | ICD-10-CM | POA: Diagnosis not present

## 2022-07-19 DIAGNOSIS — J029 Acute pharyngitis, unspecified: Secondary | ICD-10-CM | POA: Diagnosis not present

## 2022-07-19 DIAGNOSIS — R519 Headache, unspecified: Secondary | ICD-10-CM | POA: Diagnosis not present

## 2022-07-27 ENCOUNTER — Ambulatory Visit (INDEPENDENT_AMBULATORY_CARE_PROVIDER_SITE_OTHER): Payer: 59

## 2022-07-27 DIAGNOSIS — U071 COVID-19: Secondary | ICD-10-CM

## 2022-07-27 NOTE — Assessment & Plan Note (Signed)
Patient presents via telehealth for respiratory symptoms in the setting of positive COVID test 8 days ago.  Her symptoms began 12 days ago.  She reports that her symptoms have been worsening since urgent care visit last Monday.  She continues with lightheadedness and headache.  Denies cough but does have some yellow/green phlegm that she feels like comes up from her throat.  She denies fevers or chills.  Denies nausea, vomiting, or diarrhea.  She reports having had 2 COVID vaccines.  I suspect this is a continuation of her viral syndrome.  She is outside the window for antiviral.  I do not suspect postviral pneumonia at this point, especially given lack of fever or cough. -Discussed importance of calling clinic if her symptoms fail to improve or worsen by next week.  Recommend she call clinic if she develops a productive cough or fevers.  Discussed supportive care including ibuprofen and over-the-counter cold remedies.

## 2022-07-27 NOTE — Progress Notes (Signed)
[facility] Internal Medicine Residency Telephone Encounter Continuity Care Appointment  HPI:  This telephone encounter was created for [name] on 07/27/2022 for the following purpose/cc positive covid test. Symptoms started 12 days ago, went to UC last Monday, positive for covid. Did not receive medication. Feels worse now - feels faint, lightheaded, has a headache. Endorses SOB, though very minimal. Denies chest pain. Denies cough, but endorses phlegm production that worsens throughout the day. Yellow/green in color. Denies fevers or chills. Denies nausea, vomiting, diarrhea. Endorses fatigue. Endorses covid vaccine x 2.   Past Medical History:  Past Medical History:  Diagnosis Date   Adnexal cyst 2011   removed   Anemia    Arthritis    "neck; spine" (08/29/2014)   Asthma    Carpal tunnel syndrome of left wrist    Chronic lower back pain    Complication of anesthesia    difficult to awake   Daily headache 12/2013   "tx'd and no problems anymore" (08/29/2014)   Dyspnea    with asthma attack- none recent   Family history of adverse reaction to anesthesia    "1 sister and both parents hard to wake up"   Heart murmur    "dx'd when I was a teenager; haven't had any problems w/it"   Hip pain, right    History of bronchitis    Hypertension    Urinary incontinence    Urinary urgency    UTI (lower urinary tract infection)      ROS:  See detailed assessment and plan for pertinent ROS.   Assessment / Plan / Recommendations:  Please see A&P under problem oriented charting for assessment of the patient's acute and chronic medical conditions.  As always, pt is advised that if symptoms worsen or new symptoms arise, they should go to an urgent care facility or to to ER for further evaluation.   Consent and Medical Decision Making:  Patient discussed with Dr. Mayford Knife This is a telephone encounter between Brandi Chase and Brandi Chase on 07/27/2022 for upper  respiratory symptoms. The visit was conducted with the patient located at home and Brandi Chase at [facility]. The patient's identity was confirmed using their DOB and current address. The patient has consented to being evaluated through a telephone encounter and understands the associated risks (an examination cannot be done and the patient may need to come in for an appointment) / benefits (allows the patient to remain at home, decreasing exposure to coronavirus). I personally spent 20 minutes on medical discussion.  COVID Patient presents via telehealth for respiratory symptoms in the setting of positive COVID test 8 days ago.  Her symptoms began 12 days ago.  She reports that her symptoms have been worsening since urgent care visit last Monday.  She continues with lightheadedness and headache.  Denies cough but does have some yellow/green phlegm that she feels like comes up from her throat.  She denies fevers or chills.  Denies nausea, vomiting, or diarrhea.  She reports having had 2 COVID vaccines.  I suspect this is a continuation of her viral syndrome.  She is outside the window for antiviral.  I do not suspect postviral pneumonia at this point, especially given lack of fever or cough. -Discussed importance of calling clinic if her symptoms fail to improve or worsen by next week.  Recommend she call clinic if she develops a productive cough or fevers.  Discussed supportive care including ibuprofen and over-the-counter cold remedies.

## 2022-08-08 NOTE — Progress Notes (Signed)
Internal Medicine Clinic Attending  Case discussed with Dr. White  At the time of the visit.  We reviewed the resident's history and exam and pertinent patient test results.  I agree with the assessment, diagnosis, and plan of care documented in the resident's note.  

## 2022-08-27 ENCOUNTER — Telehealth: Payer: Self-pay | Admitting: Student

## 2022-08-27 NOTE — Telephone Encounter (Signed)
Contacted [place] to schedule their annual wellness visit. Appointment made for 09/15/2022.  [facility] Care Guide [facility] AWV TEAM Direct Dial: [phone]

## 2022-09-02 ENCOUNTER — Ambulatory Visit (INDEPENDENT_AMBULATORY_CARE_PROVIDER_SITE_OTHER): Payer: 59

## 2022-09-02 ENCOUNTER — Encounter: Payer: Self-pay | Admitting: Student

## 2022-09-02 ENCOUNTER — Ambulatory Visit (INDEPENDENT_AMBULATORY_CARE_PROVIDER_SITE_OTHER): Payer: 59 | Admitting: Student

## 2022-09-02 ENCOUNTER — Other Ambulatory Visit: Payer: Self-pay

## 2022-09-02 VITALS — BP 127/79 | HR 80 | Temp 98.3°F | Ht 62.0 in | Wt 221.4 lb

## 2022-09-02 DIAGNOSIS — Z0000 Encounter for general adult medical examination without abnormal findings: Secondary | ICD-10-CM | POA: Diagnosis not present

## 2022-09-02 DIAGNOSIS — H00019 Hordeolum externum unspecified eye, unspecified eyelid: Secondary | ICD-10-CM

## 2022-09-02 DIAGNOSIS — H00014 Hordeolum externum left upper eyelid: Secondary | ICD-10-CM | POA: Diagnosis not present

## 2022-09-02 HISTORY — DX: Hordeolum externum unspecified eye, unspecified eyelid: H00.019

## 2022-09-02 NOTE — Patient Instructions (Signed)
Boneta Lucks you for allowing me to take part in your care today.  Here are your instructions.  1. Regarding your eye, I think that you have a stye. I want you to try warm compresses and see if you can get this to drain on its own.   2. Please return in about 2 weeks if these does not resolve.   3. If you develop fevers, pain with any movement of your eyes, get a very red eye, notice purulent drainage, or see your eye lid turning very red, you need to call us back.   Thank you, Dr. Allena Katz  If you have any other questions please contact the internal medicine clinic at [phone]

## 2022-09-02 NOTE — Progress Notes (Signed)
   CC: Left eye swelling  HPI:  [name] is a 57 y.o. female with a past medical history of hypertension presenting for left eye swelling for the past week.  Please see assessment and plan for full HPI.  Past Medical History:  Diagnosis Date   Adnexal cyst 2011   removed   Anemia    Arthritis    "neck; spine" (08/29/2014)   Asthma    Carpal tunnel syndrome of left wrist    Chronic lower back pain    Complication of anesthesia    difficult to awake   Daily headache 12/2013   "tx'd and no problems anymore" (08/29/2014)   Dyspnea    with asthma attack- none recent   Family history of adverse reaction to anesthesia    "1 sister and both parents hard to wake up"   Heart murmur    "dx'd when I was a teenager; haven't had any problems w/it"   Hip pain, right    History of bronchitis    Hypertension    Urinary incontinence    Urinary urgency    UTI (lower urinary tract infection)      Current Outpatient Medications:    ibuprofen (ADVIL) 200 MG tablet, Take 600 mg by mouth every 6 (six) hours as needed., Disp: , Rfl:   Review of Systems:    Eye: Left eye swelling   Physical Exam:  Vitals:   09/02/22 1355  BP: 127/79  Pulse: 80  Temp: 98.3 F (36.8 C)  TempSrc: Oral  SpO2: 100%  Weight: 221 lb 6.4 oz (100.4 kg)  Height: 5\' 2"  (1.575 m)    General: Patient is sitting comfortably in the room  Eyes: Pupils equal and reactive to light, EOM intact with no eye pain, no conjunctival erythema appreciated.  Internal left eyelid with erythema on upper eyelid with no drainage, or bleeding appreciated.  Left inner upper eyelid with tiny growth appreciated.  Left eye not tender to touch.  Left outer eyelid with some swelling noted on left eye.  Right eye unremarkable. Head: Normocephalic, atraumatic   Assessment & Plan:   Stye Patient expresses 1 week history of left eye swelling.  She states that this started after she felt a pop in her left eye 1 week ago.  Since  then she as kept a watch on this. She then noticed 2 days ago she developed the left eye swelling.  She denies any pain, but does report she has some soreness to the eyelid.  She denies any pain with extraocular movements.  She denies any redness in her eye.  She denies any trauma to her eye.  She has not tried anything for this.  On my exam, I appreciate what I think is a chalazion or a stye, with outer left eyelid swelling with no erythema appreciated on out of her skin.  I think this is likely a chalazion or stye which can be treated with supportive care.  Might take some time to resolve, but should.  Plan: -Supportive care with warm compresses to encourage drainage -Follow-up in 2 weeks if not improved -The patient has not improved, can potentially refer to ophthalmologist for drainage or glucocorticoid injection  Patient seen with Dr. Dickie La  Modena Slater, DO PGY-1 Internal Medicine Resident  Pager: [phone]

## 2022-09-02 NOTE — Patient Instructions (Signed)

## 2022-09-02 NOTE — Progress Notes (Signed)
Subjective:   Brandi Chase is a 57 y.o. female who presents for Medicare Annual (Subsequent) preventive examination.  Review of Systems    Defer to PCP.        Objective:    Today's Vitals   09/02/22 1613  BP: 127/79  Pulse: 80  Temp: 98.3 F (36.8 C)  TempSrc: Oral  SpO2: 100%  Weight: 221 lb 6.4 oz (100.4 kg)  Height: 5\' 2"  (1.575 m)   Body mass index is 40.49 kg/m.     09/02/2022    4:16 PM 09/02/2022    1:58 PM 06/28/2022    2:15 PM 05/28/2022   10:16 AM 02/11/2022    3:34 PM 11/04/2021    1:41 PM 07/22/2021    8:55 AM  Advanced Directives  Does Patient Have a Medical Advance Directive? No No No No No No No  Would patient like information on creating a medical advance directive? No - Patient declined No - Patient declined No - Patient declined No - Patient declined No - Patient declined No - Patient declined No - Patient declined    Current Medications (verified) Outpatient Encounter Medications as of 09/02/2022  Medication Sig   ibuprofen (ADVIL) 200 MG tablet Take 600 mg by mouth every 6 (six) hours as needed.   No facility-administered encounter medications on file as of 09/02/2022.    Allergies (verified) Other, Primatene mist [epinephrine], Shrimp [shellfish allergy], Aleve [naproxen sodium], Bactrim ds [sulfamethoxazole w/trimethoprim (co-trimoxazole)], Midol [ibuprofen], Tylenol [acetaminophen], Avocado, Cocoa butter, Diflucan [fluconazole], and Latex   History: Past Medical History:  Diagnosis Date   Adnexal cyst 2011   removed   Anemia    Arthritis    "neck; spine" (08/29/2014)   Asthma    Carpal tunnel syndrome of left wrist    Chronic lower back pain    Complication of anesthesia    difficult to awake   Daily headache 12/2013   "tx'd and no problems anymore" (08/29/2014)   Dyspnea    with asthma attack- none recent   Family history of adverse reaction to anesthesia    "1 sister and both parents hard to wake up"   Heart murmur    "dx'd  when I was a teenager; haven't had any problems w/it"   Hip pain, right    History of bronchitis    Hypertension    Urinary incontinence    Urinary urgency    UTI (lower urinary tract infection)    Past Surgical History:  Procedure Laterality Date   BACK SURGERY     x2   JOINT REPLACEMENT Right 11/2015   hip   OVARIAN CYST REMOVAL Right 2011   REDUCTION MAMMAPLASTY Bilateral 1995   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   Procedure: TOTAL HIP ARTHROPLASTY ANTERIOR APPROACH;  Surgeon: Gean Birchwood, MD;  Location: [facility] OR;  Service: Orthopedics;  Laterality: Right;   TOTAL HIP ARTHROPLASTY Left 07/19/2016   Procedure: TOTAL HIP ARTHROPLASTY ANTERIOR APPROACH;  Surgeon: Gean Birchwood, MD;  Location: [facility] OR;  Service: Orthopedics;  Laterality: Left;   TRANSFORAMINAL LUMBAR INTERBODY FUSION (TLIF) WITH PEDICLE SCREW FIXATION 2 LEVEL N/A 08/29/2014   Procedure: TRANSFORAMINAL LUMBAR INTERBODY FUSION (TLIF) L4-S1;  Surgeon: Venita Lick, MD;  Location: [facility] OR;  Service: Orthopedics;  Laterality: N/A;   TUBAL LIGATION  1992   Family History  Problem Relation Age of Onset   Pancreatic cancer Mother    Cancer Mother    Hypertension Mother  Arthritis Mother    Prostate cancer Father    Cancer Father        Prostate   Hypertension Father    Asthma Father    Allergic rhinitis Father    Arthritis Sister    Scoliosis Sister    Sleep apnea Daughter    Narcolepsy Daughter    Asthma Daughter    Colon cancer Neg Hx    Liver cancer Neg Hx    Stomach cancer Neg Hx    Rectal cancer Neg Hx    Esophageal cancer Neg Hx    Social History   Socioeconomic History   Marital status: Single    Spouse name: Not on file   Number of children: 3   Years of education: Not on file   Highest education level: Not on file  Occupational History   Occupation: Disabled   Occupation: CNA  Tobacco Use   Smoking status: Never   Smokeless tobacco: Never  Vaping Use    Vaping Use: Never used  Substance and Sexual Activity   Alcohol use: No   Drug use: No   Sexual activity: Yes    Birth control/protection: Surgical  Other Topics Concern   Not on file  Social History Narrative   Current Social History 01/03/2019        Patient lives with daughter and 2 yo grandson in a second floor apartment. There are 14 steps with handrails up to the entrance the patient uses.       Patient's method of transportation is personal car.      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      The patient currently disabled.      Identified important Relationships are "My brother's girlfriend, they've been together 20 years."       Pets : None       Interests / Fun: "Walking around [place] lake, reading"      Current Stressors: "Nothing really. I try not to let things stress me."       Religious / Personal Beliefs: "Christian"       L. Leward Quan, RN, BSN       Social Determinants of Health   Financial Resource Strain: Medium Risk (09/02/2022)   Overall Financial Resource Strain (CARDIA)    Difficulty of Paying Living Expenses: Somewhat hard  Food Insecurity: Food Insecurity Present (09/02/2022)   Hunger Vital Sign    Worried About Running Out of Food in the Last Year: Sometimes true    Ran Out of Food in the Last Year: Sometimes true  Transportation Needs: Unmet Transportation Needs (09/02/2022)   PRAPARE - Transportation    Lack of Transportation (Medical): No    Lack of Transportation (Non-Medical): Yes  Physical Activity: Insufficiently Active (09/02/2022)   Exercise Vital Sign    Days of Exercise per Week: 2 days    Minutes of Exercise per Session: 20 min  Stress: No Stress Concern Present (09/02/2022)   [employer] of Occupational Health - Occupational Stress Questionnaire    Feeling of Stress : Only a little  Social Connections: Moderately Integrated (09/02/2022)   Social Connection and Isolation Panel [NHANES]     Frequency of Communication with Friends and Family: More than three times a week    Frequency of Social Gatherings with Friends and Family: Three times a week    Attends Religious Services: More than 4 times per year    Active Member of Clubs or Organizations: Yes  Attends Banker Meetings: More than 4 times per year    Marital Status: Divorced    Tobacco Counseling Counseling given: Not Answered   Clinical Intake:  Pre-visit preparation completed: Yes  Pain : No/denies pain     BMI - recorded: 40.49 Nutritional Status: BMI > 30  Obese Nutritional Risks: None Diabetes: No  How often do you need to have someone help you when you read instructions, pamphlets, or other written materials from your doctor or pharmacy?: 1 - Never What is the last grade level you completed in school?: 12TH GRADE  Diabetic?no  Interpreter Needed?: No  Information entered by :: Noretta Frier, CMA 09/02/2022   Activities of Daily Living    09/02/2022    4:16 PM 09/02/2022    1:57 PM  In your present state of health, do you have any difficulty performing the following activities:  Hearing? 0 0  Comment  "Buzzing" left ear  Vision? 0 0  Difficulty concentrating or making decisions? 0 0  Walking or climbing stairs? 0 0  Dressing or bathing? 0 0  Doing errands, shopping? 0 0    Patient Care Team: Evlyn Kanner, MD as PCP - General Thad Ranger Otilio Carpen, MD as Consulting Physician (Orthopedic Surgery) Pyrtle, Carie Caddy, MD as Consulting Physician (Gastroenterology) Allie Bossier, MD as Consulting Physician (Obstetrics and Gynecology)  Indicate any recent Medical Services you may have received from other than [facility] providers in the past year (date may be approximate).     Assessment:   This is a routine wellness examination for Brandi Chase.  Hearing/Vision screen No results found.  Dietary issues and exercise activities discussed:     Goals Addressed   None   Depression  Screen    09/02/2022    4:16 PM 09/02/2022    2:51 PM 06/28/2022    2:18 PM 05/28/2022   11:29 AM 02/11/2022    3:36 PM 11/04/2021    1:41 PM 08/27/2021    3:25 PM  PHQ 2/9 Scores  PHQ - 2 Score 0 0 0 0 0 0 0  PHQ- 9 Score 0 0   3      Fall Risk    09/02/2022    4:16 PM 09/02/2022    1:57 PM 06/28/2022    2:16 PM 05/28/2022   10:16 AM 02/11/2022    3:34 PM  Fall Risk   Falls in the past year? 0 0 0 0 0  Number falls in past yr: 0 0 0 0   Injury with Fall? 0 0 0 0   Risk for fall due to : No Fall Risks    No Fall Risks  Follow up Falls evaluation completed Falls evaluation completed Falls evaluation completed Falls evaluation completed Falls evaluation completed    FALL RISK PREVENTION PERTAINING TO THE HOME:  Any stairs in or around the home? Yes  If so, are there any without handrails? No  Home free of loose throw rugs in walkways, pet beds, electrical cords, etc? No  Adequate lighting in your home to reduce risk of falls? Yes   ASSISTIVE DEVICES UTILIZED TO PREVENT FALLS:  Life alert? No  Use of a cane, walker or w/c? No  Grab bars in the bathroom? Yes  Shower chair or bench in shower? No  Elevated toilet seat or a handicapped toilet? No   TIMED UP AND GO:  Was the test performed? No .  Length of time to ambulate 10 feet: n/a sec.  Gait steady and fast without use of assistive device  Cognitive Function:        Immunizations Immunization History  Administered Date(s) Administered   PFIZER(Purple Top)SARS-COV-2 Vaccination 01/01/2020, 01/22/2020   Td 10/21/2008    TDAP status: Due, Education has been provided regarding the importance of this vaccine. Advised may receive this vaccine at local pharmacy or Health Dept. Aware to provide a copy of the vaccination record if obtained from local pharmacy or Health Dept. Verbalized acceptance and understanding.  Flu Vaccine status: Up to date   Covid-19 vaccine status: Information provided on how to obtain vaccines.    Qualifies for Shingles Vaccine? Yes   Zostavax completed No   Shingrix Completed?: No.    Education has been provided regarding the importance of this vaccine. Patient has been advised to call insurance company to determine out of pocket expense if they have not yet received this vaccine. Advised may also receive vaccine at local pharmacy or Health Dept. Verbalized acceptance and understanding.  Screening Tests Health Maintenance  Topic Date Due   Zoster Vaccines- Shingrix (1 of 2) Never done   DTaP/Tdap/Td (2 - Tdap) 10/22/2018   COVID-19 Vaccine (3 - 2023-24 season) 12/11/2021   INFLUENZA VACCINE  11/11/2022   Medicare Annual Wellness (AWV)  09/02/2023   MAMMOGRAM  11/17/2023   PAP SMEAR-Modifier  08/25/2024   Colonoscopy  02/26/2029   Hepatitis C Screening  Completed   HIV Screening  Completed   HPV VACCINES  Aged Out    Health Maintenance  Health Maintenance Due  Topic Date Due   Zoster Vaccines- Shingrix (1 of 2) Never done   DTaP/Tdap/Td (2 - Tdap) 10/22/2018   COVID-19 Vaccine (3 - 2023-24 season) 12/11/2021    Colorectal cancer screening: Type of screening: Colonoscopy. Completed 02/27/2019. Repeat every 10 years  Mammogram status: Completed 11/16/2021. Repeat every year  Bone Density status: Defer to PCP.  Lung Cancer Screening: (Low Dose CT Chest recommended if Age 69-80 years, 30 pack-year currently smoking OR have quit w/in 15years.) does not qualify.   Lung Cancer Screening Referral: Defer to PCP.   Additional Screening:  Hepatitis C Screening: does qualify; Completed 02/02/2019  Vision Screening: Recommended annual ophthalmology exams for early detection of glaucoma and other disorders of the eye. Is the patient up to date with their annual eye exam?  No  Who is the provider or what is the name of the office in which the patient attends annual eye exams? Defer to PCP.  If pt is not established with a provider, would they like to be referred to a provider  to establish care? Yes .   Dental Screening: Recommended annual dental exams for proper oral hygiene  Community Resource Referral / Chronic Care Management: CRR required this visit?  No   CCM required this visit?  No      Plan:     I have personally reviewed and noted the following in the patient's chart:   Medical and social history Use of alcohol, tobacco or illicit drugs  Current medications and supplements including opioid prescriptions. Patient is not currently taking opioid prescriptions. Functional ability and status Nutritional status Physical activity Advanced directives List of other physicians Hospitalizations, surgeries, and ER visits in previous 12 months Vitals Screenings to include cognitive, depression, and falls Referrals and appointments  In addition, I have reviewed and discussed with patient certain preventive protocols, quality metrics, and best practice recommendations. A written personalized care plan for preventive services as well as  general preventive health recommendations were provided to patient.     Equilla Que, CMA   09/02/2022   Nurse Notes: Face to Face.  Ms. Wischmeyer , Thank you for taking time to come for your Medicare Wellness Visit. I appreciate your ongoing commitment to your health goals. Please review the following plan we discussed and let me know if I can assist you in the future.   These are the goals we discussed:  Goals       Continue walking 3 days per week, 23 minutes each time (pt-stated)        This is a list of the screening recommended for you and due dates:  Health Maintenance  Topic Date Due   Zoster (Shingles) Vaccine (1 of 2) Never done   DTaP/Tdap/Td vaccine (2 - Tdap) 10/22/2018   COVID-19 Vaccine (3 - 2023-24 season) 12/11/2021   Flu Shot  11/11/2022   Medicare Annual Wellness Visit  09/02/2023   Mammogram  11/17/2023   Pap Smear  08/25/2024   Colon Cancer Screening  02/26/2029   Hepatitis C Screening   Completed   HIV Screening  Completed   HPV Vaccine  Aged Out

## 2022-09-02 NOTE — Assessment & Plan Note (Signed)
Patient expresses 1 week history of left eye swelling.  She states that this started after she felt a pop in her left eye 1 week ago.  Since then she as kept a watch on this. She then noticed 2 days ago she developed the left eye swelling.  She denies any pain, but does report she has some soreness to the eyelid.  She denies any pain with extraocular movements.  She denies any redness in her eye.  She denies any trauma to her eye.  She has not tried anything for this.  On my exam, I appreciate what I think is a chalazion or a stye, with outer left eyelid swelling with no erythema appreciated on out of her skin.  I think this is likely a chalazion or stye which can be treated with supportive care.  Might take some time to resolve, but should.  Plan: -Supportive care with warm compresses to encourage drainage -Follow-up in 2 weeks if not improved -The patient has not improved, can potentially refer to ophthalmologist for drainage or glucocorticoid injection

## 2022-09-03 NOTE — Progress Notes (Addendum)
Internal Medicine Clinic Attending  I saw and evaluated the patient.  I personally confirmed the key portions of the history and exam documented by Dr. Allena Katz and I reviewed pertinent patient test results.  The assessment, diagnosis, and plan were formulated together and I agree with the documentation in the resident's note with the following clarification.   Exam appears most consistent with chalazion of the left eyelid. Conservative management recommended. This may take many weeks to resolve.

## 2022-09-07 ENCOUNTER — Encounter: Payer: 59 | Admitting: Student

## 2022-09-08 ENCOUNTER — Encounter: Payer: Self-pay | Admitting: Student

## 2022-09-08 ENCOUNTER — Ambulatory Visit (INDEPENDENT_AMBULATORY_CARE_PROVIDER_SITE_OTHER): Payer: 59 | Admitting: Student

## 2022-09-08 VITALS — BP 146/88 | HR 76 | Temp 98.5°F | Ht 62.0 in | Wt 222.2 lb

## 2022-09-08 DIAGNOSIS — R3989 Other symptoms and signs involving the genitourinary system: Secondary | ICD-10-CM | POA: Diagnosis not present

## 2022-09-08 DIAGNOSIS — H43392 Other vitreous opacities, left eye: Secondary | ICD-10-CM

## 2022-09-08 DIAGNOSIS — I10 Essential (primary) hypertension: Secondary | ICD-10-CM | POA: Diagnosis not present

## 2022-09-08 HISTORY — DX: Other symptoms and signs involving the genitourinary system: R39.89

## 2022-09-08 LAB — POCT URINALYSIS DIPSTICK
Bilirubin, UA: NEGATIVE
Glucose, UA: NEGATIVE
Ketones, UA: NEGATIVE
Leukocytes, UA: NEGATIVE
Nitrite, UA: NEGATIVE
Protein, UA: NEGATIVE
Spec Grav, UA: >=1.03 — AB (ref 1.010–1.025)
Urobilinogen, UA: 0.2 E.U./dL
pH, UA: 5.5 (ref 5.0–8.0)

## 2022-09-08 NOTE — Assessment & Plan Note (Addendum)
Patient is here requesting testing for concern of having a UTI.  She states that the last time she had a severe UTI, she did not have any symptoms and had to be hospitalized.  This time her main symptom mild changes in her taste that she noted a few days ago.  She states that she commonly has suprapubic tenderness when she has a UTI but not this time.  She denies dysuria, urinary frequency, urgency or flank pain.  There is no change in the urine color or hematuria.  Urine dipstick that was obtained prior to my evaluation was negative for leukocytes or nitrites.  There is small blood but she states that is from her vaginal bleeding.   I reassured patient that she does not have a UTI.  We talked about the risk of treating asymptomatic bacteriuria which can cause unnecessary bacterial resistance.

## 2022-09-08 NOTE — Assessment & Plan Note (Addendum)
Patient was seen 1 week ago for left upper eyelid chalazion that was treated with warm compress.  Her symptoms have improved significantly but she noted a brown spot floater in her left upper outer visual field 2 days ago.  The floater moves when she moves her eyes.  Otherwise she denies any visual change, loss of vision, blurry vision, double vision, discharge, fever, chills, eye pain, foreign body sensation, tearing.  Visual inspection showed almost complete resolution of her chalazion.  There is no eye discharge, conjunctival injection or hemorrhage.  No foreign body.  EOM intact.  PERRLA.  She has intact visual field.  I reassured patient that there is no obvious sign of ongoing infection.  Regarding her floater, will refer her to an ophthalmologist who can perform a more thorough exam with pupil dilation for better vitreous visualization.  Patient will notify us if she has any loss of vision, eye pain or eye discharge.  Patient agrees with the plan.

## 2022-09-08 NOTE — Assessment & Plan Note (Signed)
Her blood pressure is elevated at 146/88.  Recheck blood pressure remains elevated.  Patient states that her blood pressure is always high when she goes to the doctor office.  Looking back at her chart, she has had multiple high blood pressure values.  Patient is currently not on medication and would like to recheck her blood pressure at next visit before starting medication.

## 2022-09-08 NOTE — Patient Instructions (Signed)
Ms. Mochel,  It was nice seeing you in the clinic today.  I placed a referral to an ophthalmologist who can further evaluate for your floater.  I do not see any obvious signs of infection at this time.  Please let us know right away if you experience loss of vision, pain, or discharge.  Your urine dipstick is not consistent with a urinary tract infection.  Please return in 3 months for blood pressure recheck and A1c.  Dr. Cyndie Chime

## 2022-09-08 NOTE — Progress Notes (Signed)
CC: Vision floater  HPI:  [name] is a 57 y.o. living with hypertension who presents to the clinic for 2 days of having a floater of her left eye.  Please see problem based charting for detail  Past Medical History:  Diagnosis Date   Adnexal cyst 2011   removed   Anemia    Arthritis    "neck; spine" (08/29/2014)   Asthma    Carpal tunnel syndrome of left wrist    Chronic lower back pain    Complication of anesthesia    difficult to awake   Daily headache 12/2013   "tx'd and no problems anymore" (08/29/2014)   Dyspnea    with asthma attack- none recent   Family history of adverse reaction to anesthesia    "1 sister and both parents hard to wake up"   Heart murmur    "dx'd when I was a teenager; haven't had any problems w/it"   Hip pain, right    History of bronchitis    Hypertension    Urinary incontinence    Urinary urgency    UTI (lower urinary tract infection)    Review of Systems:  per HPI  Physical Exam:  Vitals:   09/08/22 1452 09/08/22 1544  BP: (!) 146/88 (!) 146/88  Pulse: 76 76  Temp: 98.5 F (36.9 C)   TempSrc: Oral   SpO2: 98%   Weight: 222 lb 3.2 oz (100.8 kg)   Height: 5\' 2"  (1.575 m)    Physical Exam Constitutional:      General: She is not in acute distress.    Appearance: She is not ill-appearing.  Eyes:     General:        Right eye: No discharge.        Left eye: No discharge.     Comments: Her chalazion has almost resolved.  No discharge, conjunctival injection or hemorrhage noted.  No signs of foreign body.  EOM intact.  Visual field intact.  Cardiovascular:     Rate and Rhythm: Normal rate and regular rhythm.  Pulmonary:     Effort: Pulmonary effort is normal. No respiratory distress.     Breath sounds: Normal breath sounds. No wheezing.  Abdominal:     Comments: No suprapubic tenderness.  No CVA tenderness  Musculoskeletal:        General: Normal range of motion.  Skin:    General: Skin is warm.  Neurological:      Mental Status: She is alert. Mental status is at baseline.  Psychiatric:        Mood and Affect: Mood normal.      Assessment & Plan:   See Encounters Tab for problem based charting.  Floater, vitreous, left Patient was seen 1 week ago for left upper eyelid chalazion that was treated with warm compress.  Her symptoms have improved significantly but she noted a brown spot floater in her left upper outer visual field 2 days ago.  The floater moves when she moves her eyes.  Otherwise she denies any visual change, loss of vision, blurry vision, double vision, discharge, fever, chills, eye pain, foreign body sensation, tearing.  Visual inspection showed almost complete resolution of her chalazion.  There is no eye discharge, conjunctival injection or hemorrhage.  No foreign body.  EOM intact.  PERRLA.  She has intact visual field.  I reassured patient that there is no obvious sign of ongoing infection.  Regarding her floater, will refer her to an ophthalmologist  who can perform a more thorough exam with pupil dilation for better vitreous visualization.  Patient will notify us if she has any loss of vision, eye pain or eye discharge.  Patient agrees with the plan.  Suspected UTI Patient is here requesting testing for concern of having a UTI.  She states that the last time she had a severe UTI, she did not have any symptoms and had to be hospitalized.  This time her main symptom mild changes in her taste that she noted a few days ago.  She states that she commonly has suprapubic tenderness when she has a UTI but not this time.  She denies dysuria, urinary frequency, urgency or flank pain.  There is no change in the urine color or hematuria.  Urine dipstick that was obtained prior to my evaluation was negative for leukocytes or nitrites.  There is small blood but she states that is from her vaginal bleeding.   I reassured patient that she does not have a UTI.  We talked about the risk of treating  asymptomatic bacteriuria which can cause unnecessary bacterial resistance.    Essential hypertension Her blood pressure is elevated at 146/88.  Recheck blood pressure remains elevated.  Patient states that her blood pressure is always high when she goes to the doctor office.  Looking back at her chart, she has had multiple high blood pressure values.  Patient is currently not on medication and would like to recheck her blood pressure at next visit before starting medication.   Patient discussed with Dr. Heide Spark

## 2022-09-13 NOTE — Progress Notes (Signed)
Internal Medicine Clinic Attending  Case discussed with Dr. Nguyen  At the time of the visit.  We reviewed the resident's history and exam and pertinent patient test results.  I agree with the assessment, diagnosis, and plan of care documented in the resident's note. 

## 2022-09-14 ENCOUNTER — Encounter: Payer: Self-pay | Admitting: *Deleted

## 2022-09-14 NOTE — Progress Notes (Signed)
Internal Medicine Clinic Attending AWV reviewed and accepted.

## 2022-10-09 ENCOUNTER — Emergency Department (HOSPITAL_BASED_OUTPATIENT_CLINIC_OR_DEPARTMENT_OTHER)
Admission: EM | Admit: 2022-10-09 | Discharge: 2022-10-09 | Disposition: A | Discharge status: Home / Self Care | Payer: 59 | Attending: Emergency Medicine | Admitting: Emergency Medicine

## 2022-10-09 ENCOUNTER — Emergency Department (HOSPITAL_BASED_OUTPATIENT_CLINIC_OR_DEPARTMENT_OTHER): Payer: 59

## 2022-10-09 ENCOUNTER — Encounter (HOSPITAL_BASED_OUTPATIENT_CLINIC_OR_DEPARTMENT_OTHER): Payer: Self-pay | Admitting: Emergency Medicine

## 2022-10-09 DIAGNOSIS — J45909 Unspecified asthma, uncomplicated: Secondary | ICD-10-CM | POA: Diagnosis not present

## 2022-10-09 DIAGNOSIS — R109 Unspecified abdominal pain: Secondary | ICD-10-CM | POA: Diagnosis present

## 2022-10-09 DIAGNOSIS — K5732 Diverticulitis of large intestine without perforation or abscess without bleeding: Secondary | ICD-10-CM | POA: Insufficient documentation

## 2022-10-09 DIAGNOSIS — K5792 Diverticulitis of intestine, part unspecified, without perforation or abscess without bleeding: Secondary | ICD-10-CM

## 2022-10-09 DIAGNOSIS — Z91040 Latex allergy status: Secondary | ICD-10-CM | POA: Insufficient documentation

## 2022-10-09 DIAGNOSIS — I10 Essential (primary) hypertension: Secondary | ICD-10-CM | POA: Diagnosis not present

## 2022-10-09 DIAGNOSIS — R1032 Left lower quadrant pain: Secondary | ICD-10-CM | POA: Diagnosis not present

## 2022-10-09 LAB — URINALYSIS, W/ REFLEX TO CULTURE (INFECTION SUSPECTED)
Bilirubin Urine: NEGATIVE
Glucose, UA: NEGATIVE mg/dL
Leukocytes,Ua: NEGATIVE
Nitrite: NEGATIVE
Protein, ur: 30 mg/dL — AB
Specific Gravity, Urine: 1.032 — ABNORMAL HIGH (ref 1.005–1.030)
pH: 5.5 (ref 5.0–8.0)

## 2022-10-09 LAB — CBC WITH DIFFERENTIAL/PLATELET
Abs Immature Granulocytes: 0.02 10*3/uL (ref 0.00–0.07)
Basophils Absolute: 0 10*3/uL (ref 0.0–0.1)
Basophils Relative: 0 %
Eosinophils Absolute: 0.1 10*3/uL (ref 0.0–0.5)
Eosinophils Relative: 1 %
HCT: 41.8 % (ref 36.0–46.0)
Hemoglobin: 14 g/dL (ref 12.0–15.0)
Immature Granulocytes: 0 %
Lymphocytes Relative: 28 %
Lymphs Abs: 2.8 10*3/uL (ref 0.7–4.0)
MCH: 29.5 pg (ref 26.0–34.0)
MCHC: 33.5 g/dL (ref 30.0–36.0)
MCV: 88 fL (ref 80.0–100.0)
Monocytes Absolute: 0.6 10*3/uL (ref 0.1–1.0)
Monocytes Relative: 6 %
Neutro Abs: 6.6 10*3/uL (ref 1.7–7.7)
Neutrophils Relative %: 65 %
Platelets: 232 10*3/uL (ref 150–400)
RBC: 4.75 MIL/uL (ref 3.87–5.11)
RDW: 13.7 % (ref 11.5–15.5)
WBC: 10.2 10*3/uL (ref 4.0–10.5)
nRBC: 0 % (ref 0.0–0.2)

## 2022-10-09 LAB — COMPREHENSIVE METABOLIC PANEL
ALT: 10 U/L (ref 0–44)
AST: 14 U/L — ABNORMAL LOW (ref 15–41)
Albumin: 3.9 g/dL (ref 3.5–5.0)
Alkaline Phosphatase: 57 U/L (ref 38–126)
Anion gap: 8 (ref 5–15)
BUN: 13 mg/dL (ref 6–20)
CO2: 26 mmol/L (ref 22–32)
Calcium: 9.2 mg/dL (ref 8.9–10.3)
Chloride: 105 mmol/L (ref 98–111)
Creatinine, Ser: 1 mg/dL (ref 0.44–1.00)
GFR, Estimated: >60 mL/min (ref >60)
Glucose, Bld: 148 mg/dL — ABNORMAL HIGH (ref 70–99)
Potassium: 3.7 mmol/L (ref 3.5–5.1)
Sodium: 139 mmol/L (ref 135–145)
Total Bilirubin: 0.5 mg/dL (ref 0.3–1.2)
Total Protein: 7.6 g/dL (ref 6.5–8.1)

## 2022-10-09 LAB — LIPASE, BLOOD: Lipase: 42 U/L (ref 11–51)

## 2022-10-09 LAB — PREGNANCY, URINE: Preg Test, Ur: NEGATIVE

## 2022-10-09 MED ORDER — IOHEXOL 300 MG/ML  SOLN
100.0000 mL | Freq: Once | INTRAMUSCULAR | Status: AC | PRN
  Administered 2022-10-09: 100 mL via INTRAVENOUS

## 2022-10-09 MED ORDER — AMOXICILLIN-POT CLAVULANATE 875-125 MG PO TABS: 1.0000 | ORAL_TABLET | Freq: Two times a day (BID) | ORAL | 0 refills | Status: AC

## 2022-10-09 MED ORDER — ONDANSETRON 4 MG PO TBDP: 4.0000 mg | ORAL_TABLET | Freq: Three times a day (TID) | ORAL | 0 refills | Status: DC | PRN

## 2022-10-09 MED ORDER — DICYCLOMINE HCL 20 MG PO TABS: 20.0000 mg | ORAL_TABLET | Freq: Two times a day (BID) | ORAL | 0 refills | Status: DC

## 2022-10-09 NOTE — ED Provider Notes (Signed)
[place] EMERGENCY DEPARTMENT AT [facility] Provider Note   CSN: [id number] Arrival date & time: 10/09/22  1625     History {Add pertinent medical, surgical, social history, OB history to HPI:1} Chief Complaint  Patient presents with   Abdominal Pain    Brandi Chase is a 57 y.o. female.  HPI     57 year old female with a history of asthma, hypertension, ovarian cysts, presents to concern for abdominal pain.  Reports that her discomfort started yesterday.  Reports it felt like menstrual cramps on the left side.  Yesterday the pain was severe, associated with nausea and vomiting, but today she is not having any continued nausea, vomiting constipation, hematuria, dysuria, significant diarrhea.  Does report she has had loose stool.  Today the pain is about 4 out of 10, cramping and located in the left lower quadrant.  Denies chest pain or dyspnea.  Reports she has not had her period since May 2021, but her OB physician has told her that she is not menopausal yet.  Past Medical History:  Diagnosis Date   Adnexal cyst 2011   removed   Anemia    Arthritis    "neck; spine" (08/29/2014)   Asthma    Carpal tunnel syndrome of left wrist    Chronic lower back pain    Complication of anesthesia    difficult to awake   Daily headache 12/2013   "tx'd and no problems anymore" (08/29/2014)   Dyspnea    with asthma attack- none recent   Family history of adverse reaction to anesthesia    "1 sister and both parents hard to wake up"   Heart murmur    "dx'd when I was a teenager; haven't had any problems w/it"   Hip pain, right    History of bronchitis    Hypertension    Urinary incontinence    Urinary urgency    UTI (lower urinary tract infection)      Home Medications Prior to Admission medications   Medication Sig Start Date End Date Taking? Authorizing Provider  ibuprofen (ADVIL) 200 MG tablet Take 600 mg by mouth every 6 (six) hours as needed.    [provider]      Allergies    Other, Primatene mist [epinephrine], Shrimp [shellfish allergy], Aleve [naproxen sodium], Bactrim ds [sulfamethoxazole w/trimethoprim (co-trimoxazole)], Midol [ibuprofen], Tylenol [acetaminophen], Avocado, Cocoa butter, Diflucan [fluconazole], and Latex    Review of Systems   Review of Systems  Physical Exam Updated Vital Signs BP 117/81 (BP Location: Right Arm)   Pulse 99   Temp 97.7 F (36.5 C)   Resp 17   LMP 08/17/2019 (Exact Date)   SpO2 95%  Physical Exam Vitals and nursing note reviewed.  Constitutional:      General: She is not in acute distress.    Appearance: She is well-developed. She is not diaphoretic.  HENT:     Head: Normocephalic and atraumatic.  Eyes:     Conjunctiva/sclera: Conjunctivae normal.  Cardiovascular:     Rate and Rhythm: Normal rate and regular rhythm.     Heart sounds: Normal heart sounds. No murmur heard.    No friction rub. No gallop.  Pulmonary:     Effort: Pulmonary effort is normal. No respiratory distress.     Breath sounds: Normal breath sounds. No wheezing or rales.  Abdominal:     General: There is no distension.     Palpations: Abdomen is soft.     Tenderness: There  is abdominal tenderness in the left lower quadrant. There is no guarding.  Musculoskeletal:        General: No tenderness.     Cervical back: Normal range of motion.  Skin:    General: Skin is warm and dry.     Findings: No erythema or rash.  Neurological:     Mental Status: She is alert and oriented to person, place, and time.     ED Results / Procedures / Treatments   Labs (all labs ordered are listed, but only abnormal results are displayed) Labs Reviewed  COMPREHENSIVE METABOLIC PANEL - Abnormal; Notable for the following components:      Result Value   Glucose, Bld 148 (*)    AST 14 (*)    All other components within normal limits  URINALYSIS, W/ REFLEX TO CULTURE (INFECTION SUSPECTED) - Abnormal; Notable for the  following components:   Specific Gravity, Urine 1.032 (*)    Hgb urine dipstick MODERATE (*)    Ketones, ur TRACE (*)    Protein, ur 30 (*)    Bacteria, UA RARE (*)    All other components within normal limits  CBC WITH DIFFERENTIAL/PLATELET  LIPASE, BLOOD  PREGNANCY, URINE    EKG EKG Interpretation Date/Time:  Saturday October 09 2022 17:00:30 EDT Ventricular Rate:  96 PR Interval:  145 QRS Duration:  98 QT Interval:  347 QTC Calculation: 439 R Axis:   31  Text Interpretation: Sinus rhythm Borderline T abnormalities, anterior leads  Nonspecific changes in comparison to prior Confirmed by Alvira Monday ([id number]) on 10/09/2022 5:37:29 PM  Radiology No results found.  Procedures Procedures  {Document cardiac monitor, telemetry assessment procedure when appropriate:1}  Medications Ordered in ED Medications  iohexol (OMNIPAQUE) 300 MG/ML solution 100 mL (100 mLs Intravenous Contrast Given 10/09/22 1744)    ED Course/ Medical Decision Making/ A&P   {   Click here for ABCD2, HEART and other calculatorsREFRESH Note before signing :1}                           57 year old female with a history of asthma, hypertension, ovarian cysts, presents to concern for abdominal pain.  DDx includes appendicitis, pancreatitis, cholecystitis, pyelonephritis, nephrolithiasis, diverticulitis, SBO, ectopic pregnancy, PID, torsion.   Labs completed and personally about interpreted by me show negative pregnancy test, no sign of urinary tract infection, no clinically significant electrolyte abnormalities, transaminitis, pancreatitis, no leukocytosis or anemia.  Given left lower quadrant abdominal tenderness, CT abdomen pelvis was ordered to evaluate for diverticulitis and shows***   {Document critical care time when appropriate:1} {Document review of labs and clinical decision tools ie heart score, Chads2Vasc2 etc:1}  {Document your independent review of radiology images, and any outside  records:1} {Document your discussion with family members, caretakers, and with consultants:1} {Document social determinants of health affecting pt's care:1} {Document your decision making why or why not admission, treatments were needed:1} Final Clinical Impression(s) / ED Diagnoses Final diagnoses:  None    Rx / DC Orders ED Discharge Orders     None

## 2022-10-09 NOTE — ED Triage Notes (Signed)
Pain in LLQ abdo. Started yesterday, better today but still pain Worse with sitting or changing position Denies n/v/d, no blood in stool or urine

## 2022-10-27 ENCOUNTER — Encounter: Payer: Self-pay | Admitting: Student

## 2022-10-27 ENCOUNTER — Ambulatory Visit (INDEPENDENT_AMBULATORY_CARE_PROVIDER_SITE_OTHER): Payer: 59 | Admitting: Student

## 2022-10-27 VITALS — BP 133/73 | HR 76 | Temp 98.2°F | Ht 62.0 in | Wt 219.3 lb

## 2022-10-27 DIAGNOSIS — K5790 Diverticulosis of intestine, part unspecified, without perforation or abscess without bleeding: Secondary | ICD-10-CM | POA: Diagnosis not present

## 2022-10-27 NOTE — Patient Instructions (Signed)
Thank you, Ms.[name] for allowing [place] to provide your care today. Today we discussed Ed follow up, diverticulitis.    I have ordered the following labs for you:  Lab Orders  No laboratory test(s) ordered today     Referrals ordered today:   Referral Orders  No referral(s) requested today     I have ordered the following medication/changed the following medications:   Stop the following medications: Medications Discontinued During This Encounter  Medication Reason   dicyclomine (BENTYL) 20 MG tablet    ibuprofen (ADVIL) 200 MG tablet      Start the following medications: No orders of the defined types were placed in this encounter.    Follow up: 3 months   We look forward to seeing you next time. Please call our clinic at [phone] if you have any questions or concerns. The best time to call is Monday-Friday from 9am-4pm, but there is someone available 24/7. If after hours or the weekend, call the main hospital number and ask for the Internal Medicine Resident On-Call. If you need medication refills, please notify your pharmacy one week in advance and they will send [place] a request.   Thank you for trusting me with your care. Wishing you the best!  Lovie Macadamia MD [facility]

## 2022-10-27 NOTE — Assessment & Plan Note (Addendum)
Patient was seen at the ED on 10/09/2022.  At that time they were experiencing increased abdominal pain at the left lower quadrant.  Workup in the ED revealed acute diverticulitis.  The patient was given a 7-day course of Augmentin, as well as Bentyl and Zofran for symptom management.  The patient follows up with [place] today for ED follow-up.  The patient notes resolution of their abdominal pain about 4 to 5 days after discharge.  They have completed their antibiotic regiment.  They have taken Zofran a few times since discharge, but have not required it within the last week.  They have 1 bowel movement a day which they say is loose, but nonbloody or dark.  They deny fever chills or night sweats.  On abdominal exam the patient has mild epigastric tenderness, and mild tenderness of the left lower quadrant to deep palpation.  Otherwise denies pain, nausea vomiting, or abdominal discomfort.  Plan: - Discussed with the patient regarding their diagnosis of diverticulosis reviewing the pathophysiology of diverticulosis, and differentiate between diverticulosis and diverticulitis.  Discussed with the patient return precautions. -Discussed with the patient healthy bowel habits.  -- Routine follow up three months

## 2022-10-27 NOTE — Progress Notes (Signed)
Subjective:  CC: ED follow-up, seen on 10/09/2022 for acute diverticulitis.  HPI:  [name] is a 57 y.o. female with a past medical history stated below and presents today for follow-up on her ED visit for acute diverticulitis. Please see problem based assessment and plan for additional details.  Past Medical History:  Diagnosis Date   Adnexal cyst 2011   removed   Anemia    Arthritis    "neck; spine" (08/29/2014)   Asthma    Carpal tunnel syndrome of left wrist    Chronic lower back pain    Complication of anesthesia    difficult to awake   Daily headache 12/2013   "tx'd and no problems anymore" (08/29/2014)   Dyspnea    with asthma attack- none recent   Family history of adverse reaction to anesthesia    "1 sister and both parents hard to wake up"   Heart murmur    "dx'd when I was a teenager; haven't had any problems w/it"   Hip pain, right    History of bronchitis    Hypertension    Urinary incontinence    Urinary urgency    UTI (lower urinary tract infection)     Current Outpatient Medications on File Prior to Visit  Medication Sig Dispense Refill   ondansetron (ZOFRAN-ODT) 4 MG disintegrating tablet Take 1 tablet (4 mg total) by mouth every 8 (eight) hours as needed for nausea or vomiting. 20 tablet 0   No current facility-administered medications on file prior to visit.    Family History  Problem Relation Age of Onset   Pancreatic cancer Mother    Cancer Mother    Hypertension Mother    Arthritis Mother    Prostate cancer Father    Cancer Father        Prostate   Hypertension Father    Asthma Father    Allergic rhinitis Father    Arthritis Sister    Scoliosis Sister    Sleep apnea Daughter    Narcolepsy Daughter    Asthma Daughter    Colon cancer Neg Hx    Liver cancer Neg Hx    Stomach cancer Neg Hx    Rectal cancer Neg Hx    Esophageal cancer Neg Hx     Social History   Socioeconomic History   Marital status: Single     Spouse name: Not on file   Number of children: 3   Years of education: Not on file   Highest education level: Not on file  Occupational History   Occupation: Disabled   Occupation: CNA  Tobacco Use   Smoking status: Never   Smokeless tobacco: Never  Vaping Use   Vaping status: Never Used  Substance and Sexual Activity   Alcohol use: No   Drug use: No   Sexual activity: Yes    Birth control/protection: Surgical  Other Topics Concern   Not on file  Social History Narrative   Current Social History 01/03/2019        Patient lives with daughter and 2 yo grandson in a second floor apartment. There are 14 steps with handrails up to the entrance the patient uses.       Patient's method of transportation is personal car.      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      The patient currently disabled.      Identified important Relationships are "My brother's girlfriend, they've  been together 20 years."       Pets : None       Interests / Fun: "Walking around [place] lake, reading"      Current Stressors: "Nothing really. I try not to let things stress me."       Religious / Personal Beliefs: "Christian"       L. Leward Quan, RN, BSN       Social Determinants of Health   Financial Resource Strain: Medium Risk (09/02/2022)   Overall Financial Resource Strain (CARDIA)    Difficulty of Paying Living Expenses: Somewhat hard  Food Insecurity: Food Insecurity Present (09/02/2022)   Hunger Vital Sign    Worried About Running Out of Food in the Last Year: Sometimes true    Ran Out of Food in the Last Year: Sometimes true  Transportation Needs: Unmet Transportation Needs (09/02/2022)   PRAPARE - Transportation    Lack of Transportation (Medical): No    Lack of Transportation (Non-Medical): Yes  Physical Activity: Insufficiently Active (09/02/2022)   Exercise Vital Sign    Days of Exercise per Week: 2 days    Minutes of Exercise per Session: 20 min  Stress:  No Stress Concern Present (09/02/2022)   [employer] of Occupational Health - Occupational Stress Questionnaire    Feeling of Stress : Only a little  Social Connections: Moderately Integrated (09/02/2022)   Social Connection and Isolation Panel [NHANES]    Frequency of Communication with Friends and Family: More than three times a week    Frequency of Social Gatherings with Friends and Family: Three times a week    Attends Religious Services: More than 4 times per year    Active Member of Clubs or Organizations: Yes    Attends Banker Meetings: More than 4 times per year    Marital Status: Divorced  Intimate Partner Violence: Not At Risk (09/02/2022)   Humiliation, Afraid, Rape, and Kick questionnaire    Fear of Current or Ex-Partner: No    Emotionally Abused: No    Physically Abused: No    Sexually Abused: No    Review of Systems: ROS negative except for what is noted on the assessment and plan.  Objective:   Vitals:   10/27/22 1028  BP: 133/73  Pulse: 76  Temp: 98.2 F (36.8 C)  TempSrc: Oral  SpO2: 99%  Weight: 219 lb 4.8 oz (99.5 kg)  Height: 5\' 2"  (1.575 m)    Physical Exam: Constitutional: well-appearing in no acute distress HENT: normocephalic atraumatic, mucous membranes moist Eyes: conjunctiva non-erythematous Neck: supple Cardiovascular: regular rate and rhythm, no m/r/g Pulmonary/Chest: normal work of breathing on room air, lungs clear to auscultation bilaterally Abdominal: mild epigastric tenderness, and mild tenderness of the left lower quadrant to deep palpation MSK: normal bulk and tone Skin: warm and dry Psych: normal mood and affect     Assessment & Plan:  Diverticulosis Patient was seen at the ED on 10/09/2022.  At that time they were experiencing increased abdominal pain at the left lower quadrant.  Workup in the ED revealed acute diverticulitis.  The patient was given a 7-day course of Augmentin, as well as Bentyl and Zofran for  symptom management.  The patient follows up with [place] today for ED follow-up.  The patient notes resolution of their abdominal pain about 4 to 5 days after discharge.  They have completed their antibiotic regiment.  They have taken Zofran a few times since discharge, but have not required it within the last  week.  They have 1 bowel movement a day which they say is loose, but nonbloody or dark.  They deny fever chills or night sweats.  On abdominal exam the patient has mild epigastric tenderness, and mild tenderness of the left lower quadrant to deep palpation.  Otherwise denies pain, nausea vomiting, or abdominal discomfort.  Plan: - Discussed with the patient regarding their diagnosis of diverticulosis reviewing the pathophysiology of diverticulosis, and differentiate between diverticulosis and diverticulitis.  Discussed with the patient return precautions. -Discussed with the patient healthy bowel habits.  -- Routine follow up three months   Patient seen with Dr. Willow Ora MD [facility]  PGY-[id number] Pager: [phone]  Phone: [phone] Date 10/27/2022  Time 1:23 PM

## 2022-11-09 NOTE — Progress Notes (Signed)
Internal Medicine Clinic Attending  I was physically present during the key portions of the resident provided service and participated in the medical decision making of patient's management care. I reviewed pertinent patient test results.  The assessment, diagnosis, and plan were formulated together and I agree with the documentation in the resident's note for this ED f/u visit.  Miguel Aschoff, MD

## 2022-11-18 ENCOUNTER — Ambulatory Visit: Payer: 59

## 2022-11-20 ENCOUNTER — Encounter (HOSPITAL_BASED_OUTPATIENT_CLINIC_OR_DEPARTMENT_OTHER): Payer: Self-pay

## 2022-11-20 ENCOUNTER — Emergency Department (HOSPITAL_BASED_OUTPATIENT_CLINIC_OR_DEPARTMENT_OTHER)
Admission: EM | Admit: 2022-11-20 | Discharge: 2022-11-20 | Disposition: A | Discharge status: Home / Self Care | Payer: 59 | Attending: Emergency Medicine | Admitting: Emergency Medicine

## 2022-11-20 ENCOUNTER — Other Ambulatory Visit: Payer: Self-pay

## 2022-11-20 DIAGNOSIS — Z91040 Latex allergy status: Secondary | ICD-10-CM | POA: Diagnosis not present

## 2022-11-20 DIAGNOSIS — R42 Dizziness and giddiness: Secondary | ICD-10-CM | POA: Insufficient documentation

## 2022-11-20 DIAGNOSIS — I10 Essential (primary) hypertension: Secondary | ICD-10-CM | POA: Insufficient documentation

## 2022-11-20 LAB — CBC
HCT: 42.3 % (ref 36.0–46.0)
Hemoglobin: 14.1 g/dL (ref 12.0–15.0)
MCH: 29.3 pg (ref 26.0–34.0)
MCHC: 33.3 g/dL (ref 30.0–36.0)
MCV: 87.8 fL (ref 80.0–100.0)
Platelets: 251 10*3/uL (ref 150–400)
RBC: 4.82 MIL/uL (ref 3.87–5.11)
RDW: 14.1 % (ref 11.5–15.5)
WBC: 6.7 10*3/uL (ref 4.0–10.5)
nRBC: 0 % (ref 0.0–0.2)

## 2022-11-20 LAB — BASIC METABOLIC PANEL
Anion gap: 7 (ref 5–15)
BUN: 16 mg/dL (ref 6–20)
CO2: 26 mmol/L (ref 22–32)
Calcium: 9.5 mg/dL (ref 8.9–10.3)
Chloride: 107 mmol/L (ref 98–111)
Creatinine, Ser: 0.89 mg/dL (ref 0.44–1.00)
GFR, Estimated: >60 mL/min (ref >60)
Glucose, Bld: 103 mg/dL — ABNORMAL HIGH (ref 70–99)
Potassium: 4 mmol/L (ref 3.5–5.1)
Sodium: 140 mmol/L (ref 135–145)

## 2022-11-20 LAB — URINALYSIS, ROUTINE W REFLEX MICROSCOPIC
Bacteria, UA: NONE SEEN
Bilirubin Urine: NEGATIVE
Glucose, UA: NEGATIVE mg/dL
Ketones, ur: NEGATIVE mg/dL
Leukocytes,Ua: NEGATIVE
Nitrite: NEGATIVE
Protein, ur: NEGATIVE mg/dL
Specific Gravity, Urine: 1.025 (ref 1.005–1.030)
pH: 5.5 (ref 5.0–8.0)

## 2022-11-20 LAB — CBG MONITORING, ED: Glucose-Capillary: 104 mg/dL — ABNORMAL HIGH (ref 70–99)

## 2022-11-20 MED ORDER — SODIUM CHLORIDE 0.9 % IV BOLUS
1000.0000 mL | Freq: Once | INTRAVENOUS | Status: AC
  Administered 2022-11-20: 1000 mL via INTRAVENOUS

## 2022-11-20 NOTE — ED Notes (Signed)
Patient ambulatory to restroom with steady gait to obtain urine specimen.

## 2022-11-20 NOTE — ED Triage Notes (Signed)
Pt presents with lightheadedness, nausea, and "faint" for the last few days. Denies emesis, endorses constipation. Denies difficulty breathing.

## 2022-11-20 NOTE — Discharge Instructions (Signed)
Your basic labs and urine test were normal today.  Your EKG is unchanged from previous and does not show any signs of an irregular heartbeat.  Please follow-up with your primary care team in about 3 days for recheck of your symptoms.  If you develop lightheadedness and pass out completely, or if you have associated chest pain or shortness of breath, or symptoms that last longer than usual you should return to the emergency department.  If you develop a severe headache with confusion or vomiting or strokelike symptoms such as weakness in your arms or your legs, vision changes, difficulty walking or talking--this should also cause you return to the emergency room.

## 2022-11-20 NOTE — ED Provider Notes (Signed)
EMERGENCY DEPARTMENT AT [facility] Provider Note   CSN: [id number] Arrival date & time: 11/20/22  1649     History  No chief complaint on file.   Brandi Chase is a 58 y.o. female.  Patient with history of hypertension, anemia, high cholesterol, episodic lightheadedness -- presents to the emergency department today for evaluation of lightheaded spells.  She has had symptoms recently that have been worse, such as earlier today and 3 days ago.  No full syncope.  She describes "eye twitching" with lightheadedness lasting about 15 or 20 seconds.  She had a headache early this morning lasting about 2 hours which is now resolved.  No associated vomiting or confusion.  No fevers.  No chest pain or shortness of breath.  No history of arrhythmia.  Patient denies signs of stroke including: facial droop, slurred speech, aphasia, weakness/numbness in extremities, imbalance/trouble walking.  Patient saw a cardiologist at the end of 2023 and had a coronary CT ordered but this was never performed.  She cannot remember if these episodes of lightheadedness are similar to when she was evaluated by her PCP in February of this year.  She does report a lot of stressors.       Home Medications Prior to Admission medications   Medication Sig Start Date End Date Taking? Authorizing Provider  ondansetron (ZOFRAN-ODT) 4 MG disintegrating tablet Take 1 tablet (4 mg total) by mouth every 8 (eight) hours as needed for nausea or vomiting. 10/09/22   Alvira Monday, MD      Allergies    Other, Primatene mist [epinephrine], Shrimp [shellfish allergy], Aleve [naproxen sodium], Bactrim ds [sulfamethoxazole w/trimethoprim (co-trimoxazole)], Midol [ibuprofen], Tylenol [acetaminophen], Avocado, Cocoa butter, Diflucan [fluconazole], and Latex    Review of Systems   Review of Systems  Physical Exam Updated Vital Signs BP (!) 144/83 (BP Location: Right Arm)   Pulse 70   Temp 99 F (37.2 C)    Resp 17   LMP 08/17/2019 (Exact Date)   SpO2 93%   Physical Exam Vitals and nursing note reviewed.  Constitutional:      Appearance: She is well-developed. She is not diaphoretic.  HENT:     Head: Normocephalic and atraumatic.     Mouth/Throat:     Mouth: Mucous membranes are not dry.  Eyes:     Conjunctiva/sclera: Conjunctivae normal.  Neck:     Vascular: Normal carotid pulses. No JVD.     Trachea: Trachea normal. No tracheal deviation.  Cardiovascular:     Rate and Rhythm: Normal rate and regular rhythm.     Pulses: No decreased pulses.          Radial pulses are 2+ on the right side and 2+ on the left side.     Heart sounds: Normal heart sounds, S1 normal and S2 normal. No murmur heard. Pulmonary:     Effort: Pulmonary effort is normal. No respiratory distress.     Breath sounds: No wheezing.  Chest:     Chest wall: No tenderness.  Abdominal:     General: Bowel sounds are normal.     Palpations: Abdomen is soft.     Tenderness: There is no abdominal tenderness. There is no guarding or rebound.  Musculoskeletal:        General: Normal range of motion.     Cervical back: Normal range of motion and neck supple. No muscular tenderness.  Skin:    General: Skin is warm and dry.     Coloration:  Skin is not pale.  Neurological:     General: No focal deficit present.     Mental Status: She is alert and oriented to person, place, and time. Mental status is at baseline.     Cranial Nerves: No cranial nerve deficit.     Sensory: No sensory deficit.     Motor: No weakness.     Coordination: Coordination normal.     Gait: Gait normal.  Psychiatric:        Mood and Affect: Mood normal.     ED Results / Procedures / Treatments   Labs (all labs ordered are listed, but only abnormal results are displayed) Labs Reviewed  BASIC METABOLIC PANEL - Abnormal; Notable for the following components:      Result Value   Glucose, Bld 103 (*)    All other components within normal  limits  URINALYSIS, ROUTINE W REFLEX MICROSCOPIC - Abnormal; Notable for the following components:   Hgb urine dipstick SMALL (*)    All other components within normal limits  CBG MONITORING, ED - Abnormal; Notable for the following components:   Glucose-Capillary 104 (*)    All other components within normal limits  CBC    ED ECG REPORT   Date: 11/20/2022  Rate: 68  Rhythm: normal sinus rhythm  QRS Axis: normal  Intervals: normal  ST/T Wave abnormalities: nonspecific ST/T changes  Conduction Disutrbances:none  Narrative Interpretation:   Old EKG Reviewed: unchanged  I have personally reviewed the EKG tracing and agree with the computerized printout as noted.   Radiology No results found.  Procedures Procedures    Medications Ordered in ED Medications  sodium chloride 0.9 % bolus 1,000 mL (has no administration in time range)    ED Course/ Medical Decision Making/ A&P    Patient seen and examined. History obtained directly from patient.  Also reviewed outpatient internal medicine notes.  Labs/EKG: Ordered CBC, CMP, UA.  EKG personally reviewed and interpreted as above.  She has nonspecific changes that are similar to previous EKG from June 29.  Imaging: None ordered  Medications/Fluids: IV fluid bolus  Most recent vital signs reviewed and are as follows: BP (!) 144/83 (BP Location: Right Arm)   Pulse 70   Temp 99 F (37.2 C)   Resp 17   LMP 08/17/2019 (Exact Date)   SpO2 93%   Initial impression: Episodic lightheadedness without chest pain or full syncope.  History of similar.  Worse over the past several days.  6:40 PM Reassessment performed. Patient appears stable.  She states that she is continuing to do well.  No recurrent symptoms.  Labs personally reviewed and interpreted including: CBC unremarkable; BMP with glucose of 103 otherwise unremarkable with normal electrolytes and kidney function; UA without signs of infection.  Reviewed pertinent lab  work and imaging with patient at bedside. Questions answered.   Most current vital signs reviewed and are as follows: BP (!) 146/86 (BP Location: Right Arm)   Pulse 60   Temp 99 F (37.2 C)   Resp 16   LMP 08/17/2019 (Exact Date)   SpO2 100%   Plan: Discharge to home.   Prescriptions written for: None  Other home care instructions discussed: Rest, hydration  ED return instructions discussed: Discussed need to return if she develops lightheadedness with full syncope, associated chest pain or shortness of breath, symptoms lasting longer than typical.  Discussed return if she develops a headache especially if he had any strokelike symptoms, confusion or vomiting.  Follow-up  instructions discussed: Patient encouraged to follow-up with their PCP in 3 days.                                   Medical Decision Making Amount and/or Complexity of Data Reviewed Labs: ordered.   Patient with episodic lightheadedness spells.  No full syncope.  No strokelike symptoms.  No chest pain or shortness of breath.  Low concern for ACS or PE.  Arrhythmia is a possibility, however no concerning features or changes on EKG today.  Consideration of outpatient Holter monitor may be appropriate if symptoms persist and are intermittent.  She did have a headache earlier today but no strokelike symptoms and this resolved without treatment.  Other than her age, no red flags with this.  She has a reassuring neuroexam today and I do not feel that she requires further evaluation or imaging at this time.  She has appropriate outpatient follow-up.  The patient's vital signs, pertinent lab work and imaging were reviewed and interpreted as discussed in the ED course. Hospitalization was considered for further testing, treatments, or serial exams/observation. However as patient is well-appearing, has a stable exam, and reassuring studies today, I do not feel that they warrant admission at this time. This plan was discussed  with the patient who verbalizes agreement and comfort with this plan and seems reliable and able to return to the Emergency Department with worsening or changing symptoms.          Final Clinical Impression(s) / ED Diagnoses Final diagnoses:  Episodic lightheadedness    Rx / DC Orders ED Discharge Orders     None         Renne Crigler, PA-C 11/20/22 1845    Sloan Leiter, DO 11/20/22 2254

## 2022-11-26 ENCOUNTER — Ambulatory Visit: Payer: 59 | Admitting: Interventional Cardiology

## 2022-11-29 ENCOUNTER — Telehealth: Payer: Self-pay | Admitting: *Deleted

## 2022-11-29 NOTE — Telephone Encounter (Signed)
Transition Care Management Unsuccessful Follow-up Telephone Call  Date of discharge and from where:  [facility]  11/20/2022  Attempts:  2nd Attempt  Reason for unsuccessful TCM follow-up call:  No answer/busy

## 2022-11-29 NOTE — Telephone Encounter (Signed)
Transition Care Management Unsuccessful Follow-up Telephone Call  Date of discharge and from where:  [facility]  11/20/2022  Attempts:  1st Attempt  Reason for unsuccessful TCM follow-up call:  No answer/busy

## 2022-12-31 ENCOUNTER — Encounter (HOSPITAL_BASED_OUTPATIENT_CLINIC_OR_DEPARTMENT_OTHER): Payer: Self-pay

## 2022-12-31 ENCOUNTER — Emergency Department (HOSPITAL_BASED_OUTPATIENT_CLINIC_OR_DEPARTMENT_OTHER)
Admission: EM | Admit: 2022-12-31 | Discharge: 2022-12-31 | Discharge status: Against Medical Advice | Payer: 59 | Attending: Emergency Medicine | Admitting: Emergency Medicine

## 2022-12-31 ENCOUNTER — Other Ambulatory Visit: Payer: Self-pay

## 2022-12-31 ENCOUNTER — Encounter (HOSPITAL_BASED_OUTPATIENT_CLINIC_OR_DEPARTMENT_OTHER): Payer: Self-pay | Admitting: Emergency Medicine

## 2022-12-31 ENCOUNTER — Emergency Department (HOSPITAL_BASED_OUTPATIENT_CLINIC_OR_DEPARTMENT_OTHER): Payer: 59

## 2022-12-31 ENCOUNTER — Emergency Department (HOSPITAL_BASED_OUTPATIENT_CLINIC_OR_DEPARTMENT_OTHER)
Admission: EM | Admit: 2022-12-31 | Discharge: 2022-12-31 | Disposition: A | Discharge status: Home / Self Care | Payer: 59 | Attending: Emergency Medicine | Admitting: Emergency Medicine

## 2022-12-31 DIAGNOSIS — K5790 Diverticulosis of intestine, part unspecified, without perforation or abscess without bleeding: Secondary | ICD-10-CM | POA: Diagnosis not present

## 2022-12-31 DIAGNOSIS — Z91040 Latex allergy status: Secondary | ICD-10-CM | POA: Insufficient documentation

## 2022-12-31 DIAGNOSIS — R1032 Left lower quadrant pain: Secondary | ICD-10-CM | POA: Insufficient documentation

## 2022-12-31 DIAGNOSIS — I10 Essential (primary) hypertension: Secondary | ICD-10-CM | POA: Insufficient documentation

## 2022-12-31 DIAGNOSIS — Z5321 Procedure and treatment not carried out due to patient leaving prior to being seen by health care provider: Secondary | ICD-10-CM

## 2022-12-31 DIAGNOSIS — M25552 Pain in left hip: Secondary | ICD-10-CM | POA: Insufficient documentation

## 2022-12-31 LAB — URINALYSIS, MICROSCOPIC (REFLEX)

## 2022-12-31 LAB — URINALYSIS, ROUTINE W REFLEX MICROSCOPIC
Bilirubin Urine: NEGATIVE
Glucose, UA: NEGATIVE mg/dL
Ketones, ur: NEGATIVE mg/dL
Leukocytes,Ua: NEGATIVE
Nitrite: NEGATIVE
Protein, ur: NEGATIVE mg/dL
Specific Gravity, Urine: 1.025 (ref 1.005–1.030)
pH: 5.5 (ref 5.0–8.0)

## 2022-12-31 LAB — COMPREHENSIVE METABOLIC PANEL
ALT: 10 U/L (ref 0–44)
AST: 15 U/L (ref 15–41)
Albumin: 4 g/dL (ref 3.5–5.0)
Alkaline Phosphatase: 65 U/L (ref 38–126)
Anion gap: 6 (ref 5–15)
BUN: 16 mg/dL (ref 6–20)
CO2: 28 mmol/L (ref 22–32)
Calcium: 9.3 mg/dL (ref 8.9–10.3)
Chloride: 105 mmol/L (ref 98–111)
Creatinine, Ser: 0.96 mg/dL (ref 0.44–1.00)
GFR, Estimated: >60 mL/min (ref >60)
Glucose, Bld: 129 mg/dL — ABNORMAL HIGH (ref 70–99)
Potassium: 4 mmol/L (ref 3.5–5.1)
Sodium: 139 mmol/L (ref 135–145)
Total Bilirubin: 0.4 mg/dL (ref 0.3–1.2)
Total Protein: 7.6 g/dL (ref 6.5–8.1)

## 2022-12-31 LAB — CBC
HCT: 44.4 % (ref 36.0–46.0)
Hemoglobin: 14.7 g/dL (ref 12.0–15.0)
MCH: 29.2 pg (ref 26.0–34.0)
MCHC: 33.1 g/dL (ref 30.0–36.0)
MCV: 88.3 fL (ref 80.0–100.0)
Platelets: 223 10*3/uL (ref 150–400)
RBC: 5.03 MIL/uL (ref 3.87–5.11)
RDW: 13.5 % (ref 11.5–15.5)
WBC: 6.1 10*3/uL (ref 4.0–10.5)
nRBC: 0 % (ref 0.0–0.2)

## 2022-12-31 LAB — LIPASE, BLOOD: Lipase: 60 U/L — ABNORMAL HIGH (ref 11–51)

## 2022-12-31 MED ORDER — OXYCODONE HCL 5 MG PO TABS
5.0000 mg | ORAL_TABLET | ORAL | 0 refills | Status: DC | PRN
Start: 2022-12-31 — End: 2023-01-20

## 2022-12-31 MED ORDER — IOHEXOL 300 MG/ML  SOLN
100.0000 mL | Freq: Once | INTRAMUSCULAR | Status: AC | PRN
  Administered 2022-12-31: 100 mL via INTRAVENOUS

## 2022-12-31 NOTE — Discharge Instructions (Addendum)
Please note your blood pressure was also high today.  This may be related to pain or discomfort.  But you should monitor your blood pressure at home and follow-up with your doctor's office for this.  Please also follow-up with your OB/GYN about your cervical abnormality on your CT scan.  This may require an additional ultrasound.  Take over-the-counter pain medication for your discomfort.  You may also take the oxycodone that we have prescribed you for any breakthrough pain.  Do not take it before driving or operating heavy machinery since it can cause drowsiness.

## 2022-12-31 NOTE — ED Triage Notes (Signed)
Pt reports left lower abdominal pain that radiates left back. Pain since yesterday. Denies N,V,D. Denies blood in urine

## 2022-12-31 NOTE — ED Provider Notes (Signed)
[place] EMERGENCY DEPARTMENT AT [facility] Provider Note   CSN: [id number] Arrival date & time: 12/31/22  1203     History  Chief Complaint  Patient presents with   Abdominal Pain    Brandi Chase is a 57 y.o. female with history of diverticulitis presenting to the ED with complaint of left lower quadrant abdominal pain.  Onset 2 days ago.  She says it feels similar to when she was seen in the ED in June, diagnosed at that time with acute uncomplicated diverticulitis, but this feels "much worse".  She denies nausea, vomiting, diarrhea or constipation.  She reports some pain in her left hip as well.  She was seen earlier today at a different med center but left without being seen  HPI     Home Medications Prior to Admission medications   Medication Sig Start Date End Date Taking? Authorizing Provider  ondansetron (ZOFRAN-ODT) 4 MG disintegrating tablet Take 1 tablet (4 mg total) by mouth every 8 (eight) hours as needed for nausea or vomiting. 10/09/22   Alvira Monday, MD      Allergies    Other, Primatene mist [epinephrine], Shrimp [shellfish allergy], Aleve [naproxen sodium], Bactrim ds [sulfamethoxazole w/trimethoprim (co-trimoxazole)], Midol [ibuprofen], Tylenol [acetaminophen], Avocado, Cocoa butter, Diflucan [fluconazole], and Latex    Review of Systems   Review of Systems  Physical Exam Updated Vital Signs BP (!) 151/102   Pulse 63   Temp 97.9 F (36.6 C) (Oral)   Resp 18   LMP 08/17/2019 (Exact Date)   SpO2 98%  Physical Exam Constitutional:      General: She is not in acute distress. HENT:     Head: Normocephalic and atraumatic.  Eyes:     Conjunctiva/sclera: Conjunctivae normal.     Pupils: Pupils are equal, round, and reactive to light.  Cardiovascular:     Rate and Rhythm: Normal rate and regular rhythm.  Pulmonary:     Effort: Pulmonary effort is normal. No respiratory distress.  Abdominal:     General: There is no distension.      Tenderness: There is abdominal tenderness in the left lower quadrant. There is no guarding.  Skin:    General: Skin is warm and dry.  Neurological:     General: No focal deficit present.     Mental Status: She is alert. Mental status is at baseline.  Psychiatric:        Mood and Affect: Mood normal.        Behavior: Behavior normal.     ED Results / Procedures / Treatments   Labs (all labs ordered are listed, but only abnormal results are displayed) Labs Reviewed  LIPASE, BLOOD - Abnormal; Notable for the following components:      Result Value   Lipase 60 (*)    All other components within normal limits  COMPREHENSIVE METABOLIC PANEL - Abnormal; Notable for the following components:   Glucose, Bld 129 (*)    All other components within normal limits  CBC    EKG None  Radiology No results found.  Procedures Procedures    Medications Ordered in ED Medications  iohexol (OMNIPAQUE) 300 MG/ML solution 100 mL (100 mLs Intravenous Contrast Given 12/31/22 1356)    ED Course/ Medical Decision Making/ A&P Clinical Course as of 12/31/22 1519  Fri Dec 31, 2022  1517 Patient is signed out to Dr Vonita Moss pending CT imaging completion [MT]    Clinical Course User Index [MT] Ziah Leandro, Kermit Balo, MD  Medical Decision Making Amount and/or Complexity of Data Reviewed Labs: ordered. Radiology: ordered.  Risk Prescription drug management.   This patient presents to the ED with concern for lower abdominal pain. This involves an extensive number of treatment options, and is a complaint that carries with it a high risk of complications and morbidity.  The differential diagnosis includes recurring diverticulitis versus referred hip pain or osteoarthritis versus colitis versus other  I ordered and personally interpreted labs.  The pertinent results include: No emergent findings on lab work  I ordered imaging studies including CT abdomen  pelvis, which was pending at the time of signout  I have low suspicion for acute pelvic pathology to require pelvic ultrasound.  Low suspicion for PID.  Doubt mesenteric ischemia or AAA.  Patient was noted to be hypertensive on arrival which may be driven by pain.  Blood pressure did improve somewhat with rest here in the ED remains mildly hypertensive.  She will need to follow-up with this outpatient.  I have reviewed the patients home medicines and have made adjustments as needed         Final Clinical Impression(s) / ED Diagnoses Final diagnoses:  Left lower quadrant abdominal pain  Hypertension, unspecified type    Rx / DC Orders ED Discharge Orders     None         Terald Sleeper, MD 12/31/22 1520

## 2022-12-31 NOTE — ED Notes (Signed)
Patient returned from CT

## 2022-12-31 NOTE — ED Notes (Signed)
Pt discharged to home using teachback Method. Discharge instructions have been discussed with patient and/or family members. Pt verbally acknowledges understanding d/c instructions, has been given opportunity for questions to be answered, and endorses comprehension to checkout at registration before leaving. Pt discharged to home using teachback Method. Discharge instructions have been discussed with patient and/or family members. Pt verbally acknowledges understanding d/c instructions, has been given opportunity for questions to be answered, and endorses comprehension to checkout at registration before leaving.

## 2022-12-31 NOTE — ED Notes (Signed)
Patient transported to CT 

## 2022-12-31 NOTE — ED Provider Notes (Signed)
Patient left ED without being seen.    Loetta Rough, MD 12/31/22 [phone]

## 2022-12-31 NOTE — ED Provider Notes (Signed)
  Physical Exam  BP (!) 151/102   Pulse 63   Temp 97.9 F (36.6 C) (Oral)   Resp 18   LMP 08/17/2019 (Exact Date)   SpO2 98%   Physical Exam  Procedures  Procedures  ED Course / MDM   Clinical Course as of 12/31/22 1620  Fri Dec 31, 2022  1517 Patient is signed out to Dr Vonita Moss pending CT imaging completion [MT]  1530 Assumed care from Dr. Renaye Rakers. 57 year old female with a history of diverticulitis who presented to the emergency department with left lower quadrant abdominal pain. [RP]  1535 CT ABDOMEN PELVIS W CONTRAST CT without diverticulitis.  Does show masslike thickening of the cervix that we will need a nonemergent ultrasound. [RP]  1620 Discussed patient's CT findings with cervical abnormality with her.  Will have her follow-up with OB/GYN for outpatient ultrasound and additional evaluation.  Was instructed to take over-the-counter pain medication and given oxycodone for any breakthrough pain.  Will have her follow-up with her primary doctor as well and was given information on how to record her blood pressure. [RP]    Clinical Course User Index [MT] Trifan, Kermit Balo, MD [RP] Rondel Baton, MD   Medical Decision Making Amount and/or Complexity of Data Reviewed Labs: ordered. Radiology: ordered. Decision-making details documented in ED Course.  Risk Prescription drug management.      Rondel Baton, MD 12/31/22 [phone]

## 2022-12-31 NOTE — ED Notes (Signed)
Unable to obtain to labs.

## 2022-12-31 NOTE — ED Triage Notes (Addendum)
Pt reports generalized lower abdominal pain and bloating since yesterday - worse with palpation and movement.  Pt took 2 ibuprofen last night with some relief. Pt denies nausea/vomiting/diarrhea.  Pt seen at [facility] this morning and left AMA because "staff was rude."  AAOx4, NAD in triage.

## 2023-01-05 DIAGNOSIS — Z118 Encounter for screening for other infectious and parasitic diseases: Secondary | ICD-10-CM | POA: Diagnosis not present

## 2023-01-20 ENCOUNTER — Encounter: Payer: Self-pay | Admitting: Student

## 2023-01-20 ENCOUNTER — Ambulatory Visit: Payer: 59 | Admitting: Student

## 2023-01-20 VITALS — BP 140/95 | HR 65 | Temp 97.7°F | Ht 62.0 in | Wt 221.7 lb

## 2023-01-20 DIAGNOSIS — N889 Noninflammatory disorder of cervix uteri, unspecified: Secondary | ICD-10-CM | POA: Diagnosis not present

## 2023-01-20 DIAGNOSIS — K219 Gastro-esophageal reflux disease without esophagitis: Secondary | ICD-10-CM | POA: Diagnosis not present

## 2023-01-20 DIAGNOSIS — R1013 Epigastric pain: Secondary | ICD-10-CM | POA: Diagnosis not present

## 2023-01-20 MED ORDER — PANTOPRAZOLE SODIUM 40 MG PO TBEC
40.0000 mg | DELAYED_RELEASE_TABLET | Freq: Every day | ORAL | 1 refills | Status: DC
Start: 2023-01-20 — End: 2023-06-20

## 2023-01-20 NOTE — Progress Notes (Signed)
Subjective:  CC: Abdominal pain  HPI:  [name] is a 57 y.o. person with a past medical history stated below and presents today for abdominal pain. Please see problem based assessment and plan for additional details.  Past Medical History:  Diagnosis Date   Adnexal cyst 2011   removed   Anemia    Arthritis    "neck; spine" (08/29/2014)   Asthma    Carpal tunnel syndrome of left wrist    Chronic lower back pain    Complication of anesthesia    difficult to awake   Daily headache 12/2013   "tx'd and no problems anymore" (08/29/2014)   Dyspnea    with asthma attack- none recent   Family history of adverse reaction to anesthesia    "1 sister and both parents hard to wake up"   Heart murmur    "dx'd when I was a teenager; haven't had any problems w/it"   Hip pain, right    History of bronchitis    Hypertension    Urinary incontinence    Urinary urgency    UTI (lower urinary tract infection)     Current Outpatient Medications on File Prior to Visit  Medication Sig Dispense Refill   ondansetron (ZOFRAN-ODT) 4 MG disintegrating tablet Take 1 tablet (4 mg total) by mouth every 8 (eight) hours as needed for nausea or vomiting. 20 tablet 0   No current facility-administered medications on file prior to visit.    Review of Systems: Please see assessment and plan for pertinent positives and negatives.  Objective:   Vitals:   01/20/23 0908 01/20/23 0929  BP: (!) 145/99 (!) 140/95  Pulse: 72 65  Temp: 97.7 F (36.5 C)   TempSrc: Oral   SpO2: 99%   Weight: 221 lb 11.2 oz (100.6 kg)   Height: 5\' 2"  (1.575 m)     Physical Exam: Constitutional: Well-appearing, in no acute distress Cardiovascular: regular rate and rhythm, no m/r/g Pulmonary/Chest: normal work of breathing on room air, lungs clear to auscultation bilaterally Abdominal: Tenderness to palpation of the epigastrium, and left lower quadrant.  No rebound or guarding.  No masses  palpated. Extremities: No edema of the lower extremities bilaterally Skin: warm and dry Psych: Appropriate mood and affect   Assessment & Plan:  Abdominal pain Patient was recently seen in the ED for left lower quadrant abdominal pain.  Of note she was seen some months ago by myself for an ED follow-up visit after being diagnosed with diverticulitis.  She seems to have chronic left lower quadrant pain for many years per chart review.  Upon speaking to the patient today it seems like the majority of her pain is actually in the epigastrium.  She says she realizes only after abdominal exam.  She endorses a 6 out of 10 pain in the epigastrium which radiates down to the left lower quadrant.  She denies fevers or chills, she says the pain is constant and worse at midday.  She sometimes gets nausea with this pain and has associated bloating.  She does endorse GERD like symptoms with burning pain that radiates into the chest.  The pain is not anginal.  The pain gets worse when laying down.  She denies postprandial symptoms.  She does endorse reddish-brown or burgundy colored stools.  No blood on the toilet paper when she wipes, and she does not think that this is related to rectal bleeding.  Last colonoscopy showed internal hemorrhoids and diverticulosis. She has  been instructed in the past avoid Tylenol and stick to NSAIDs.  She is taking a couple ibuprofen pills per month.  She has taken a few Zofran tablets since her ED visit.  Given the patient's dyspepsia and epigastric pain, differential would include GERD, peptic ulcer disease, H. pylori infection.  It is unclear the etiology of her left lower quadrant pain, workup for diverticulitis was negative at last ED visit.  She does have history of ovarian cyst with a large ovarian cyst being removed in the past, as well as rupture ovarian cyst.  There was some possible cervical thickening noted on her last CT scan for which she is following up with her  OB/GYN.  Plan: H. pylori testing today Given persistent dyspepsia, chronic left lower quadrant pain, possible blood in stool I believe a GI referral is indicated in this patient. Begin Protonix 40 mg daily Return to clinic in 3 to 4 weeks.   Cervix abnormality On recent ED visit the patient was told that she may have cervical thickening.  Difficult to assess on CT due to her bilateral hip replacements which created significant streak artifact on the scan.  She has followed up with her OB/GYN, and will follow-up with them again on the 16th and 24th.  Per the patient the OB/GYN had asked that we obtain some lab work and urine studies on the patient.  It is unclear what studies they would want, the patient did not bring in any paperwork specifying and is unsure.  We attempted to reach her OB/GYN but were unable to.  I do not have access their notes sadly. Plan: Patient to call back when we are sure of which studies they would like [place] to order. I will place lab only visit for the studies and route the results to her OB/GYN. Continue to follow with OB/GYN appointment set for October 16 and 24 for additional workup.    Patient seen with Dr. Elliot Cousin MD [facility]  PGY-1 Pager: [phone]  Phone: [phone] Date 01/20/2023  Time 11:47 AM

## 2023-01-20 NOTE — Assessment & Plan Note (Signed)
On recent ED visit the patient was told that she may have cervical thickening.  Difficult to assess on CT due to her bilateral hip replacements which created significant streak artifact on the scan.  She has followed up with her OB/GYN, and will follow-up with them again on the 16th and 24th.  Per the patient the OB/GYN had asked that we obtain some lab work and urine studies on the patient.  It is unclear what studies they would want, the patient did not bring in any paperwork specifying and is unsure.  We attempted to reach her OB/GYN but were unable to.  I do not have access their notes sadly. Plan: Patient to call back when we are sure of which studies they would like [place] to order. I will place lab only visit for the studies and route the results to her OB/GYN. Continue to follow with OB/GYN appointment set for October 16 and 24 for additional workup.

## 2023-01-20 NOTE — Patient Instructions (Signed)
Thank you, Ms.[name] for allowing [place] to provide your care today.  I have ordered the following tests for you:   Lab Orders         H. pylori breath test     Do not take Protonix until you have had your H. Pylori test done.   Referrals ordered today:    Referral Orders         Ambulatory referral to Gastroenterology      I have ordered the following medication/changed the   Start the following medications: Meds ordered this encounter  Medications   pantoprazole (PROTONIX) 40 MG tablet    Sig: Take 1 tablet (40 mg total) by mouth daily.    Dispense:  30 tablet    Refill:  1       Follow up:  3 - 4 weeks  for abdominal pain if symptoms have not improved / resolved.   We look forward to seeing you next time. Please call our clinic at [phone] if you have any questions or concerns. The best time to call is Monday-Friday from 9am-4pm, but there is someone available 24/7. If after hours or the weekend, call the main hospital number and ask for the Internal Medicine Resident On-Call. If you need medication refills, please notify your pharmacy one week in advance and they will send [place] a request.   Thank you for trusting me with your care. Wishing you the best!  Lovie Macadamia MD [facility]

## 2023-01-20 NOTE — Assessment & Plan Note (Signed)
Patient was recently seen in the ED for left lower quadrant abdominal pain.  Of note she was seen some months ago by myself for an ED follow-up visit after being diagnosed with diverticulitis.  She seems to have chronic left lower quadrant pain for many years per chart review.  Upon speaking to the patient today it seems like the majority of her pain is actually in the epigastrium.  She says she realizes only after abdominal exam.  She endorses a 6 out of 10 pain in the epigastrium which radiates down to the left lower quadrant.  She denies fevers or chills, she says the pain is constant and worse at midday.  She sometimes gets nausea with this pain and has associated bloating.  She does endorse GERD like symptoms with burning pain that radiates into the chest.  The pain is not anginal.  The pain gets worse when laying down.  She denies postprandial symptoms.  She does endorse reddish-brown or burgundy colored stools.  No blood on the toilet paper when she wipes, and she does not think that this is related to rectal bleeding.  Last colonoscopy showed internal hemorrhoids and diverticulosis. She has been instructed in the past avoid Tylenol and stick to NSAIDs.  She is taking a couple ibuprofen pills per month.  She has taken a few Zofran tablets since her ED visit.  Given the patient's dyspepsia and epigastric pain, differential would include GERD, peptic ulcer disease, H. pylori infection.  It is unclear the etiology of her left lower quadrant pain, workup for diverticulitis was negative at last ED visit.  She does have history of ovarian cyst with a large ovarian cyst being removed in the past, as well as rupture ovarian cyst.  There was some possible cervical thickening noted on her last CT scan for which she is following up with her OB/GYN.  Plan: H. pylori testing today Given persistent dyspepsia, chronic left lower quadrant pain, possible blood in stool I believe a GI referral is indicated in this  patient. Begin Protonix 40 mg daily Return to clinic in 3 to 4 weeks.

## 2023-01-21 LAB — H. PYLORI BREATH TEST: H pylori Breath Test: NEGATIVE

## 2023-01-24 NOTE — Progress Notes (Signed)
Internal Medicine Clinic Attending  I was physically present during the key portions of the resident provided service and participated in the medical decision making of patient's management care. I reviewed pertinent patient test results.  The assessment, diagnosis, and plan were formulated together and I agree with the documentation in the resident's note.  Mercie Eon, MD     H pylori testing was negative. We will begin daily PPI.  We have also referred her to GI to consider EGD +/- colonoscopy

## 2023-01-25 NOTE — Progress Notes (Signed)
I spoke with Brandi Chase on the phone. Patient's identity was confirmed using two patient specific identifiers. We discussed her h.pylori test was negative. She is having ongoing issues. Will see OBGYN tomorrow, and set up with GI soon. Instructed her to call back for appointment is she continues to have issues or if she needs  [place].

## 2023-01-26 DIAGNOSIS — R1032 Left lower quadrant pain: Secondary | ICD-10-CM | POA: Diagnosis not present

## 2023-02-01 ENCOUNTER — Telehealth: Payer: Self-pay

## 2023-02-01 ENCOUNTER — Ambulatory Visit (HOSPITAL_BASED_OUTPATIENT_CLINIC_OR_DEPARTMENT_OTHER): Payer: 59 | Admitting: Cardiology

## 2023-02-01 NOTE — Telephone Encounter (Signed)
Transition Care Management Follow-up Telephone Call Date of discharge and from where: 12/31/2022 [facility] How have you been since you were released from the hospital? Patient stated she is not feeling much better,still having abdomina pain. Any questions or concerns? No  Items Reviewed: Did the pt receive and understand the discharge instructions provided? Yes  Medications obtained and verified? Yes  Other? No  Any new allergies since your discharge? No  Dietary orders reviewed? Yes Do you have support at home? Yes   Follow up appointments reviewed:  PCP Hospital f/u appt confirmed? Yes  Scheduled to see Brandi Macadamia, MD on 01/20/2023 @ [facility]. Specialist Hospital f/u appt confirmed? Yes  Scheduled to see Brandi Monday, MD on 01/26/2023 @ [facility]. Are transportation arrangements needed? No  If their condition worsens, is the pt aware to call PCP or go to the Emergency Dept.? Yes Was the patient provided with contact information for the PCP's office or ED? Yes Was to pt encouraged to call back with questions or concerns? Yes   Brandi Chase [facility]  [facility] Guide Direct Dial: [phone]  Website: [email or web address]

## 2023-02-02 DIAGNOSIS — J039 Acute tonsillitis, unspecified: Secondary | ICD-10-CM | POA: Diagnosis not present

## 2023-02-02 DIAGNOSIS — J029 Acute pharyngitis, unspecified: Secondary | ICD-10-CM | POA: Diagnosis not present

## 2023-02-02 DIAGNOSIS — R509 Fever, unspecified: Secondary | ICD-10-CM | POA: Diagnosis not present

## 2023-02-15 DIAGNOSIS — H43392 Other vitreous opacities, left eye: Secondary | ICD-10-CM | POA: Diagnosis not present

## 2023-03-22 DIAGNOSIS — Z1231 Encounter for screening mammogram for malignant neoplasm of breast: Secondary | ICD-10-CM | POA: Diagnosis not present

## 2023-03-31 DIAGNOSIS — K5792 Diverticulitis of intestine, part unspecified, without perforation or abscess without bleeding: Secondary | ICD-10-CM | POA: Diagnosis not present

## 2023-03-31 DIAGNOSIS — R1012 Left upper quadrant pain: Secondary | ICD-10-CM | POA: Diagnosis not present

## 2023-05-04 ENCOUNTER — Encounter: Payer: 59 | Admitting: Internal Medicine

## 2023-05-16 ENCOUNTER — Encounter: Payer: Self-pay | Admitting: Obstetrics & Gynecology

## 2023-05-16 ENCOUNTER — Encounter: Payer: 59 | Admitting: Internal Medicine

## 2023-05-16 DIAGNOSIS — K648 Other hemorrhoids: Secondary | ICD-10-CM | POA: Diagnosis not present

## 2023-05-16 DIAGNOSIS — K5732 Diverticulitis of large intestine without perforation or abscess without bleeding: Secondary | ICD-10-CM | POA: Diagnosis not present

## 2023-05-16 DIAGNOSIS — K5730 Diverticulosis of large intestine without perforation or abscess without bleeding: Secondary | ICD-10-CM | POA: Diagnosis not present

## 2023-05-16 LAB — HM COLONOSCOPY

## 2023-05-17 ENCOUNTER — Encounter: Payer: 59 | Admitting: Internal Medicine

## 2023-05-23 ENCOUNTER — Ambulatory Visit: Payer: 59 | Admitting: Internal Medicine

## 2023-05-23 ENCOUNTER — Other Ambulatory Visit (HOSPITAL_COMMUNITY)
Admission: RE | Admit: 2023-05-23 | Discharge: 2023-05-23 | Disposition: A | Discharge status: Home / Self Care | Payer: 59 | Source: Ambulatory Visit | Attending: Internal Medicine | Admitting: Internal Medicine

## 2023-05-23 VITALS — BP 160/109 | HR 77 | Temp 98.0°F | Ht 62.0 in | Wt 221.6 lb

## 2023-05-23 DIAGNOSIS — N898 Other specified noninflammatory disorders of vagina: Secondary | ICD-10-CM | POA: Insufficient documentation

## 2023-05-23 DIAGNOSIS — D509 Iron deficiency anemia, unspecified: Secondary | ICD-10-CM | POA: Diagnosis not present

## 2023-05-23 DIAGNOSIS — I10 Essential (primary) hypertension: Secondary | ICD-10-CM | POA: Diagnosis not present

## 2023-05-23 DIAGNOSIS — Z1231 Encounter for screening mammogram for malignant neoplasm of breast: Secondary | ICD-10-CM

## 2023-05-23 DIAGNOSIS — R432 Parageusia: Secondary | ICD-10-CM | POA: Diagnosis not present

## 2023-05-23 MED ORDER — HYDROCHLOROTHIAZIDE 25 MG PO TABS: 25.0000 mg | ORAL_TABLET | Freq: Every day | ORAL | 1 refills | Status: DC

## 2023-05-23 NOTE — Patient Instructions (Addendum)
 Thank you, Ms. Jordayn W Tornow for allowing us  to provide your care today.  We will call you about the results of the blood work and the self-swab when they come back.   Please take your blood pressure once per day, every day, and write down the two numbers in a notebook. Bring this log with you to your next visit.    I have ordered the following labs for you:  Lab Orders         BMP8+Anion Gap         CBC no Diff         Vitamin B12        Follow up:  1 month    We look forward to seeing you next time. Please call our clinic at [phone] if you have any questions or concerns. The best time to call is Monday-Friday from 9am-4pm, but there is someone available 24/7. If after hours or the weekend, call the main hospital number and ask for the Internal Medicine Resident On-Call. If you need medication refills, please notify your pharmacy one week in advance and they will send us  a request.   Thank you for trusting me with your care. Wishing you the best!   Christa Course, MS3

## 2023-05-23 NOTE — Progress Notes (Signed)
 The care of the patient was discussed with Dr. Broadus Canes and the assessment and plan was formulated with their assistance.  Please see their note for official documentation of the patient encounter.   Subjective:   Patient ID: Brandi Chase female   DOB: [birth year] 58 y.o.   MRN: [id number]  HPI: [name] is a 58 y.o. female presenting for hypertension and acute management of vaginal discharge and loss of taste. She has been doing well overall, with good follow up with GI and Gyn for her previous abdominal pain. She has just undergone a colonoscopy last Monday.   Past Medical History:  Diagnosis Date   Adnexal cyst 2011   removed   Anemia    Arthritis    "neck; spine" (08/29/2014)   Asthma    Carpal tunnel syndrome of left wrist    Chronic lower back pain    Complication of anesthesia    difficult to awake   Daily headache 12/2013   "tx'd and no problems anymore" (08/29/2014)   Dyspnea    with asthma attack- none recent   Family history of adverse reaction to anesthesia    "1 sister and both parents hard to wake up"   Heart murmur    "dx'd when I was a teenager; haven't had any problems w/it"   Hip pain, right    History of bronchitis    Hypertension    Urinary incontinence    Urinary urgency    UTI (lower urinary tract infection)    Current Outpatient Medications  Medication Sig Dispense Refill   hydrochlorothiazide  (HYDRODIURIL ) 25 MG tablet Take 1 tablet (25 mg total) by mouth daily. 30 tablet 1   ondansetron  (ZOFRAN -ODT) 4 MG disintegrating tablet Take 1 tablet (4 mg total) by mouth every 8 (eight) hours as needed for nausea or vomiting. 20 tablet 0   pantoprazole  (PROTONIX ) 40 MG tablet Take 1 tablet (40 mg total) by mouth daily. 30 tablet 1   No current facility-administered medications for this visit.   Family History  Problem Relation Age of Onset   Pancreatic cancer Mother    Cancer Mother    Hypertension Mother    Arthritis Mother     Prostate cancer Father    Cancer Father        Prostate   Hypertension Father    Asthma Father    Allergic rhinitis Father    Arthritis Sister    Scoliosis Sister    Sleep apnea Daughter    Narcolepsy Daughter    Asthma Daughter    Colon cancer Neg Hx    Liver cancer Neg Hx    Stomach cancer Neg Hx    Rectal cancer Neg Hx    Esophageal cancer Neg Hx    Social History   Socioeconomic History   Marital status: Single    Spouse name: Not on file   Number of children: 3   Years of education: Not on file   Highest education level: Not on file  Occupational History   Occupation: Disabled   Occupation: CNA  Tobacco Use   Smoking status: Never   Smokeless tobacco: Never  Vaping Use   Vaping status: Never Used  Substance and Sexual Activity   Alcohol use: No   Drug use: No   Sexual activity: Yes    Birth control/protection: Surgical  Other Topics Concern   Not on file  Social History Narrative   Current Social History 01/03/2019  Patient lives with daughter and 2 yo grandson in a second floor apartment. There are 14 steps with handrails up to the entrance the patient uses.       Patient's method of transportation is personal car.      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      The patient currently disabled.      Identified important Relationships are "My brother's girlfriend, they've been together 20 years."       Pets : None       Interests / Fun: "Walking around the lake, reading"      Current Stressors: "Nothing really. I try not to let things stress me."       Religious / Personal Beliefs: "Christian"       L. Corinna Dickens, RN, BSN       Social Drivers of Health   Financial Resource Strain: Medium Risk (09/02/2022)   Overall Financial Resource Strain (CARDIA)    Difficulty of Paying Living Expenses: Somewhat hard  Food Insecurity: Food Insecurity Present (09/02/2022)   Hunger Vital Sign    Worried About Running Out of  Food in the Last Year: Sometimes true    Ran Out of Food in the Last Year: Sometimes true  Transportation Needs: Unmet Transportation Needs (09/02/2022)   PRAPARE - Transportation    Lack of Transportation (Medical): No    Lack of Transportation (Non-Medical): Yes  Physical Activity: Insufficiently Active (09/02/2022)   Exercise Vital Sign    Days of Exercise per Week: 2 days    Minutes of Exercise per Session: 20 min  Stress: No Stress Concern Present (09/02/2022)   [employer] of Occupational Health - Occupational Stress Questionnaire    Feeling of Stress : Only a little  Social Connections: Moderately Integrated (09/02/2022)   Social Connection and Isolation Panel [NHANES]    Frequency of Communication with Friends and Family: More than three times a week    Frequency of Social Gatherings with Friends and Family: Three times a week    Attends Religious Services: More than 4 times per year    Active Member of Clubs or Organizations: Yes    Attends Banker Meetings: More than 4 times per year    Marital Status: Divorced   Review of Systems: Pertinent items noted in HPI and remainder of comprehensive ROS otherwise negative.  Objective:  Physical Exam: Vitals:   05/23/23 1411 05/23/23 1440  BP: (!) 161/90 (!) 160/109  Pulse: 84 77  Temp: 98 F (36.7 C)   TempSrc: Oral   SpO2: 100%   Weight: 221 lb 9.6 oz (100.5 kg)   Height: 5\' 2"  (1.575 m)    BP (!) 160/109 (BP Location: Left Arm, Patient Position: Sitting, Cuff Size: Normal)   Pulse 77   Temp 98 F (36.7 C) (Oral)   Ht 5\' 2"  (1.575 m)   Wt 221 lb 9.6 oz (100.5 kg)   SpO2 100%   BMI 40.53 kg/m   General Appearance:    Alert, cooperative, no distress  Head:    Normocephalic, atraumatic  Throat:   Lips, mucosa, and tongue normal; teeth and gums normal  Back:     Symmetric, no curvature, ROM normal, no CVA tenderness  Lungs:     Clear to auscultation bilaterally, respirations unlabored   Heart:     Regular rate and rhythm, S1 and S2 normal, no murmur, rub   or gallop  Skin:   Skin color,  texture, turgor normal, no rashes or lesions  Neurologic:   Grossly normal strength and sensation. Intact sensation of the face. No TTP over the frontalis or temporalis. No TMJ tenderness. Tongue extension symmetric.   Psych:    Appropriate mood and affect.   Assessment & Plan:  Essential hypertension Blood pressure today is elevated to 161/90, 160/109 on recheck. Her blood pressures have been high to the 140s systolic for several visits, which she consistently attributes to the long walk from the parking deck. She was counseled about the importance of controlling her blood pressure and agreed to keep a BP log that she will bring in next visit. She does not want to "get hooked" on medication, but was amenable to starting hydrochlorothiazide  for blood pressure control.  - Start hydrochlorothiazide  25 mg - BP log next visit - Follow up BMP in 1 month  Vaginal discharge Ms. Flanagin presents today with a 2-3 month history of increased vaginal discharge. She describes it as tan that she notices when wiping but does not show on a pad. She is not experiencing dysuria, pruritus, or notable odor. Her LMP was in 2021. She has had a new sexual partner who did not consistently use condoms. Possible physiologic discharge vs mildly/asymptomatic infection.  - Follow up self-swab for GC/chlamydia, BV, and trichomoniasis   Loss of taste Ms. Corson reports that she has gradually lost her sense of taste since November of last year to the point that she only tastes at the back of her tongue and can only identify hot/cold with the anterior portion. Her sense of smell is intact with no deficits noted on cranial nerves V or VII. Tongue extension is smooth and symmetric. No reported difficulties breathing or swallowing. She does not remember an inciting incident or accident at the time. Tongue does not display angular cheilitis,  glossitis, furry tongue, or geographic tongue. Possible etiologies could include renal dysfunction, B12 deficiency, allergic rhinitis, or nerve disruption during dental surgery. She does not endorse symptoms of hypothyroidism. She did get COVID twice last year, but last infection was in April 2024. - Follow up CBC, BMP, B12 today  Encounter for screening for breast cancer Ms. Mcelvany reports that she received a screening mammogram with her gynecologist in November of 2024.  Christa Course, MS3

## 2023-05-23 NOTE — Assessment & Plan Note (Addendum)
 Blood pressure today is elevated to 161/90, 160/109 on recheck. Her blood pressures have been high to the 140s systolic for several visits, which she consistently attributes to the long walk from the parking deck. She was counseled about the importance of controlling her blood pressure and agreed to keep a BP log that she will bring in next visit. She does not want to "get hooked" on medication, but was amenable to starting hydrochlorothiazide  for blood pressure control.  - Start hydrochlorothiazide  25 mg - BP log next visit - Follow up BMP in 1 month

## 2023-05-23 NOTE — Progress Notes (Deleted)
 CC: ***  HPI:  Brandi Chase is a 58 y.o. female living with a history stated below and presents today for ***. Please see problem based assessment and plan for additional details.  Past Medical History:  Diagnosis Date   Adnexal cyst 2011   removed   Anemia    Arthritis    "neck; spine" (08/29/2014)   Asthma    Carpal tunnel syndrome of left wrist    Chronic lower back pain    Complication of anesthesia    difficult to awake   Daily headache 12/2013   "tx'd and no problems anymore" (08/29/2014)   Dyspnea    with asthma attack- none recent   Family history of adverse reaction to anesthesia    "1 sister and both parents hard to wake up"   Heart murmur    "dx'd when I was a teenager; haven't had any problems w/it"   Hip pain, right    History of bronchitis    Hypertension    Urinary incontinence    Urinary urgency    UTI (lower urinary tract infection)     Current Outpatient Medications on File Prior to Visit  Medication Sig Dispense Refill   ondansetron  (ZOFRAN -ODT) 4 MG disintegrating tablet Take 1 tablet (4 mg total) by mouth every 8 (eight) hours as needed for nausea or vomiting. 20 tablet 0   pantoprazole  (PROTONIX ) 40 MG tablet Take 1 tablet (40 mg total) by mouth daily. 30 tablet 1   No current facility-administered medications on file prior to visit.    Family History  Problem Relation Age of Onset   Pancreatic cancer Mother    Cancer Mother    Hypertension Mother    Arthritis Mother    Prostate cancer Father    Cancer Father        Prostate   Hypertension Father    Asthma Father    Allergic rhinitis Father    Arthritis Sister    Scoliosis Sister    Sleep apnea Daughter    Narcolepsy Daughter    Asthma Daughter    Colon cancer Neg Hx    Liver cancer Neg Hx    Stomach cancer Neg Hx    Rectal cancer Neg Hx    Esophageal cancer Neg Hx     Social History   Socioeconomic History   Marital status: Single    Spouse name: Not on file    Number of children: 3   Years of education: Not on file   Highest education level: Not on file  Occupational History   Occupation: Disabled   Occupation: CNA  Tobacco Use   Smoking status: Never   Smokeless tobacco: Never  Vaping Use   Vaping status: Never Used  Substance and Sexual Activity   Alcohol use: No   Drug use: No   Sexual activity: Yes    Birth control/protection: Surgical  Other Topics Concern   Not on file  Social History Narrative   Current Social History 01/03/2019        Patient lives with daughter and 2 yo grandson in a second floor apartment. There are 14 steps with handrails up to the entrance the patient uses.       Patient's method of transportation is personal car.      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      The patient currently disabled.      Identified important Relationships are "My brother's girlfriend,  they've been together 20 years."       Pets : None       Interests / Fun: "Walking around the lake, reading"      Current Stressors: "Nothing really. I try not to let things stress me."       Religious / Personal Beliefs: "Christian"       L. Corinna Dickens, RN, BSN       Social Drivers of Health   Financial Resource Strain: Medium Risk (09/02/2022)   Overall Financial Resource Strain (CARDIA)    Difficulty of Paying Living Expenses: Somewhat hard  Food Insecurity: Food Insecurity Present (09/02/2022)   Hunger Vital Sign    Worried About Running Out of Food in the Last Year: Sometimes true    Ran Out of Food in the Last Year: Sometimes true  Transportation Needs: Unmet Transportation Needs (09/02/2022)   PRAPARE - Transportation    Lack of Transportation (Medical): No    Lack of Transportation (Non-Medical): Yes  Physical Activity: Insufficiently Active (09/02/2022)   Exercise Vital Sign    Days of Exercise per Week: 2 days    Minutes of Exercise per Session: 20 min  Stress: No Stress Concern Present  (09/02/2022)   [employer] of Occupational Health - Occupational Stress Questionnaire    Feeling of Stress : Only a little  Social Connections: Moderately Integrated (09/02/2022)   Social Connection and Isolation Panel [NHANES]    Frequency of Communication with Friends and Family: More than three times a week    Frequency of Social Gatherings with Friends and Family: Three times a week    Attends Religious Services: More than 4 times per year    Active Member of Clubs or Organizations: Yes    Attends Banker Meetings: More than 4 times per year    Marital Status: Divorced  Intimate Partner Violence: Not At Risk (09/02/2022)   Humiliation, Afraid, Rape, and Kick questionnaire    Fear of Current or Ex-Partner: No    Emotionally Abused: No    Physically Abused: No    Sexually Abused: No    Review of Systems: ROS negative except for what is noted on the assessment and plan.  There were no vitals filed for this visit.  Physical Exam: Constitutional: well-appearing *** sitting in ***, in no acute distress HENT: normocephalic atraumatic, mucous membranes moist Eyes: conjunctiva non-erythematous Cardiovascular: regular rate and rhythm, no m/r/g Pulmonary/Chest: normal work of breathing on room air, lungs clear to auscultation bilaterally Abdominal: soft, non-tender, non-distended MSK: normal bulk and tone Neurological: alert & oriented x 3, no focal deficit Skin: warm and dry Psych: normal mood and behavior  Assessment & Plan:   Body and hands itchy  Patient {GC/[id number]::"discussed with","seen with"} Dr. {[id number]::"[id number]","G. Hoffman","Mullen","Narendra","Vincent","Guilloud","Lau","Machen"}  No problem-specific Assessment & Plan notes found for this encounter.   Brandi Chase, D.O. [facility], PGY-3 Phone: [phone] Date 05/23/2023 Time 7:20 AM

## 2023-05-24 LAB — CERVICOVAGINAL ANCILLARY ONLY
Bacterial Vaginitis (gardnerella): NEGATIVE
Candida Glabrata: NEGATIVE
Candida Vaginitis: POSITIVE — AB
Chlamydia: NEGATIVE
Comment: NEGATIVE
Comment: NEGATIVE
Comment: NEGATIVE
Comment: NEGATIVE
Comment: NEGATIVE
Comment: NORMAL
Neisseria Gonorrhea: NEGATIVE
Trichomonas: NEGATIVE

## 2023-05-24 LAB — BMP8+ANION GAP
Anion Gap: 16 mmol/L (ref 10.0–18.0)
BUN/Creatinine Ratio: 12 (ref 9–23)
BUN: 11 mg/dL (ref 6–24)
CO2: 22 mmol/L (ref 20–29)
Calcium: 9 mg/dL (ref 8.7–10.2)
Chloride: 106 mmol/L (ref 96–106)
Creatinine, Ser: 0.89 mg/dL (ref 0.57–1.00)
Glucose: 86 mg/dL (ref 70–99)
Potassium: 4.2 mmol/L (ref 3.5–5.2)
Sodium: 144 mmol/L (ref 134–144)
eGFR: 75 mL/min/{1.73_m2} (ref >59)

## 2023-05-24 LAB — VITAMIN B12: Vitamin B-12: 280 pg/mL (ref 232–1245)

## 2023-05-24 LAB — CBC
Hematocrit: 43.5 % (ref 34.0–46.6)
Hemoglobin: 14.3 g/dL (ref 11.1–15.9)
MCH: 29.9 pg (ref 26.6–33.0)
MCHC: 32.9 g/dL (ref 31.5–35.7)
MCV: 91 fL (ref 79–97)
Platelets: 232 10*3/uL (ref 150–450)
RBC: 4.79 x10E6/uL (ref 3.77–5.28)
RDW: 13.3 % (ref 11.7–15.4)
WBC: 7.4 10*3/uL (ref 3.4–10.8)

## 2023-05-25 NOTE — Progress Notes (Signed)
Self swab positive for candida - patient does not have any symptoms nor is her discharge thick/white - no treatment indicated at this time. BMP, CBC unremarkable. B12 on lower end of normal, but doubt this is etiology of her altered taste perception. She will follow up in 1 mo to recheck BMP (initiated hydrochlorothiazide at office visit) and to follow up on her loss of taste.

## 2023-05-28 DIAGNOSIS — J039 Acute tonsillitis, unspecified: Secondary | ICD-10-CM | POA: Diagnosis not present

## 2023-05-28 DIAGNOSIS — J029 Acute pharyngitis, unspecified: Secondary | ICD-10-CM | POA: Diagnosis not present

## 2023-05-28 DIAGNOSIS — N390 Urinary tract infection, site not specified: Secondary | ICD-10-CM | POA: Diagnosis not present

## 2023-05-28 DIAGNOSIS — R319 Hematuria, unspecified: Secondary | ICD-10-CM | POA: Diagnosis not present

## 2023-06-02 NOTE — Progress Notes (Signed)
Attestation for Student Documentation: Patient's presentation was discussed at length with student doctor and with resident supervisor Dr. Ned Card, and I participated in the  medical decision-making activities of this service and have verified that the service and findings are accurately documented in the student's note.  [name] loss of taste sensation in anterior tongue suggests a branch of facial nerve is affected, though there are no other signs of CN7 (facial nerve) involvement, and no other suggestion of a possible compression more proximally as might be seen in an acoustic neuroma.  Agree with observation for now.  If she should develop unilateral hearing loss or problems with balance, imaging should be considered.   Brandi Aschoff, MD 06/02/2023, 7:00 PM

## 2023-06-07 NOTE — Progress Notes (Unsigned)
 Cardiology Office Note    Patient Name: Brandi Chase Date of Encounter: 06/07/2023  Primary Care Provider:  Morene Crocker, MD Primary Cardiologist:  None Primary Electrophysiologist: None   Past Medical History    Past Medical History:  Diagnosis Date   Adnexal cyst 2011   removed   Anemia    Arthritis    "neck; spine" (08/29/2014)   Asthma    Carpal tunnel syndrome of left wrist    Chronic lower back pain    Complication of anesthesia    difficult to awake   Daily headache 12/2013   "tx'd and no problems anymore" (08/29/2014)   Dyspnea    with asthma attack- none recent   Family history of adverse reaction to anesthesia    "1 sister and both parents hard to wake up"   Heart murmur    "dx'd when I was a teenager; haven't had any problems w/it"   Hip pain, right    History of bronchitis    Hypertension    Urinary incontinence    Urinary urgency    UTI (lower urinary tract infection)     History of Present Illness  Brandi Chase is a 58 y.o. female with a PMH of HTN, mixed HLD, asthma with previous complaint of chest pain who presents today for follow-up.  Ms. Markwood was seen initially by Dr. Katrinka Blazing on 03/17/2022 by referral of her PCP for complaint of chest pain.  Her visit she reported recurrent chest pain has been ongoing for greater than 10 years.  She described the discomfort as tightness or fullness that occurs spontaneously and is more prominent during her menstrual cycle.  She was recommended to complete a coronary CTA for further evaluation however test was not completed at that time.   During today's visit the patient reports*** .  Patient denies chest pain, palpitations, dyspnea, PND, orthopnea, nausea, vomiting, dizziness, syncope, edema, weight gain, or early satiety.  ***Notes: -Last ischemic evaluation: -Last echo: -Interim ED visits: Review of Systems  Please see the history of present illness.    All other systems reviewed and are  otherwise negative except as noted above.  Physical Exam    Wt Readings from Last 3 Encounters:  05/23/23 221 lb 9.6 oz (100.5 kg)  01/20/23 221 lb 11.2 oz (100.6 kg)  12/31/22 218 lb (98.9 kg)   JX:BJYNW were no vitals filed for this visit.,There is no height or weight on file to calculate BMI. GEN: Well nourished, well developed in no acute distress Neck: No JVD; No carotid bruits Pulmonary: Clear to auscultation without rales, wheezing or rhonchi  Cardiovascular: Normal rate. Regular rhythm. Normal S1. Normal S2.   Murmurs: There is no murmur.  ABDOMEN: Soft, non-tender, non-distended EXTREMITIES:  No edema; No deformity   EKG/LABS/ Recent Cardiac Studies   ECG personally reviewed by me today - ***  Risk Assessment/Calculations:   {Does this patient have ATRIAL FIBRILLATION?[phone]}      Lab Results  Component Value Date   WBC 7.4 05/23/2023   HGB 14.3 05/23/2023   HCT 43.5 05/23/2023   MCV 91 05/23/2023   PLT 232 05/23/2023   Lab Results  Component Value Date   CREATININE 0.89 05/23/2023   BUN 11 05/23/2023   NA 144 05/23/2023   K 4.2 05/23/2023   CL 106 05/23/2023   CO2 22 05/23/2023   Lab Results  Component Value Date   CHOL 197 12/08/2020   HDL 55 12/08/2020   LDLCALC 129 (H)  12/08/2020   TRIG 70 12/08/2020   CHOLHDL 3.6 12/08/2020    Lab Results  Component Value Date   HGBA1C 5.9 (A) 02/11/2022   Assessment & Plan    1.  Chest pain:  2.  Essential hypertension:  3.  Mixed HLD:  4.***      Disposition: Follow-up with None or APP in *** months {Are you ordering a CV Procedure (e.g. stress test, cath, DCCV, TEE, etc)?   Press F2        :[id number]}   Signed, Napoleon Form, Leodis Rains, NP 06/07/2023, 7:38 AM [facility]

## 2023-06-08 ENCOUNTER — Encounter: Payer: Self-pay | Admitting: Nurse Practitioner

## 2023-06-08 ENCOUNTER — Ambulatory Visit: Payer: 59 | Attending: Nurse Practitioner | Admitting: Nurse Practitioner

## 2023-06-08 VITALS — BP 124/88 | HR 84 | Ht 62.0 in | Wt 216.8 lb

## 2023-06-08 DIAGNOSIS — R072 Precordial pain: Secondary | ICD-10-CM

## 2023-06-08 DIAGNOSIS — E782 Mixed hyperlipidemia: Secondary | ICD-10-CM | POA: Diagnosis not present

## 2023-06-08 DIAGNOSIS — R079 Chest pain, unspecified: Secondary | ICD-10-CM | POA: Diagnosis not present

## 2023-06-08 DIAGNOSIS — I10 Essential (primary) hypertension: Secondary | ICD-10-CM

## 2023-06-08 LAB — HEPATIC FUNCTION PANEL
ALT: 10 [IU]/L (ref 0–32)
AST: 12 [IU]/L (ref 0–40)
Albumin: 4.2 g/dL (ref 3.8–4.9)
Alkaline Phosphatase: 81 [IU]/L (ref 44–121)
Bilirubin Total: 0.4 mg/dL (ref 0.0–1.2)
Bilirubin, Direct: 0.14 mg/dL (ref 0.00–0.40)
Total Protein: 7.4 g/dL (ref 6.0–8.5)

## 2023-06-08 LAB — BASIC METABOLIC PANEL
BUN/Creatinine Ratio: 11 (ref 9–23)
BUN: 11 mg/dL (ref 6–24)
CO2: 24 mmol/L (ref 20–29)
Calcium: 9.4 mg/dL (ref 8.7–10.2)
Chloride: 105 mmol/L (ref 96–106)
Creatinine, Ser: 0.96 mg/dL (ref 0.57–1.00)
Glucose: 92 mg/dL (ref 70–99)
Potassium: 4.9 mmol/L (ref 3.5–5.2)
Sodium: 141 mmol/L (ref 134–144)
eGFR: 69 mL/min/{1.73_m2} (ref >59)

## 2023-06-08 LAB — LIPID PANEL
Chol/HDL Ratio: 2.8 {ratio} (ref 0.0–4.4)
Cholesterol, Total: 192 mg/dL (ref 100–199)
HDL: 68 mg/dL (ref >39)
LDL Chol Calc (NIH): 110 mg/dL — ABNORMAL HIGH (ref 0–99)
Triglycerides: 79 mg/dL (ref 0–149)
VLDL Cholesterol Cal: 14 mg/dL (ref 5–40)

## 2023-06-08 LAB — TSH: TSH: 0.174 u[IU]/mL — ABNORMAL LOW (ref 0.450–4.500)

## 2023-06-08 MED ORDER — METOPROLOL TARTRATE 100 MG PO TABS: 100.0000 mg | ORAL_TABLET | Freq: Once | ORAL | 0 refills | Status: DC

## 2023-06-08 NOTE — Patient Instructions (Addendum)
 MEDICATIONS:  Metoprolol before CT   Stop hydrochlorothiazide   Please check blood pressures for 2 weeks and send results through MyChart.   Lab Work: Lipid Panel LFTs TSH  BMP  If you have labs (blood work) drawn today and your tests are completely normal, you will receive your results only by: MyChart Message (if you have MyChart) OR A paper copy in the mail If you have any lab test that is abnormal or we need to change your treatment, we will call you to review the results.   Testing/Procedures: Coronary CTA for chest pain   Your physician has requested that you have cardiac CT. Cardiac computed tomography (CT) is a painless test that uses an x-ray machine to take clear, detailed pictures of your heart. For further information please visit [email or web address] Please follow instruction sheet as given.     Follow-Up: At [facility], you and your health needs are our priority.  As part of our continuing mission to provide you with exceptional heart care, we have created designated Provider Care Teams.  These Care Teams include your primary Cardiologist (physician) and Advanced Practice Providers (APPs -  Physician Assistants and Nurse Practitioners) who all work together to provide you with the care you need, when you need it.   Your next appointment:   3 month(s)  Provider:   Robin Searing, NP     Then, Dr. Servando Salina will plan to see you in 6 month(s).    Other Instructions   Your cardiac CT will be scheduled at one of the below locations:   [facility] [address] [place], [place] [address] [phone]   If scheduled at [facility], please arrive at the [facility] and Children's Entrance (Entrance C2) of [facility] 30 minutes prior to test start time. You can use the FREE valet parking offered at entrance C (encouraged to control the heart rate for the test)  Proceed to the [facility] Radiology Department (first floor) to  check-in and test prep.  All radiology patients and guests should use entrance C2 at [facility], accessed from [facility], even though the hospital's physical address listed is [address].    If scheduled at [facility] or [facility], please arrive 15 mins early for check-in and test prep.  There is spacious parking and easy access to the radiology department from the [facility] Heart and Vascular entrance. Please enter here and check-in with the desk attendant.   If scheduled at [facility], please arrive 30 minutes early for check-in and test prep.  Please follow these instructions carefully (unless otherwise directed):  An IV will be required for this test and Nitroglycerin will be given.  Hold all erectile dysfunction medications at least 3 days (72 hrs) prior to test. (Ie viagra, cialis, sildenafil, tadalafil, etc)   On the Night Before the Test: Be sure to Drink plenty of water. Do not consume any caffeinated/decaffeinated beverages or chocolate 12 hours prior to your test. Do not take any antihistamines 12 hours prior to your test.   On the Day of the Test: Drink plenty of water until 1 hour prior to the test. Do not eat any food 1 hour prior to test. You may take your regular medications prior to the test.  Take metoprolol (Lopressor) two hours prior to test. If you take Furosemide/Hydrochlorothiazide/Spironolactone/Chlorthalidone, please HOLD on the morning of the test. Patients who wear a continuous glucose  monitor MUST remove the device prior to scanning. FEMALES- please wear underwire-free bra if available, avoid dresses & tight clothing       After the Test: Drink plenty of water. After receiving IV contrast, you may experience a mild flushed feeling. This is normal. On occasion, you may experience a mild rash up to 24 hours after the test. This is not dangerous. If this occurs, you  can take Benadryl 25 mg, Zyrtec, Claritin, or Allegra and increase your fluid intake. (Patients taking Tikosyn should avoid Benadryl, and may take Zyrtec, Claritin, or Allegra) If you experience trouble breathing, this can be serious. If it is severe call 911 IMMEDIATELY. If it is mild, please call our office.  We will call to schedule your test 2-4 weeks out understanding that some insurance companies will need an authorization prior to the service being performed.   For more information and frequently asked questions, please visit our website : [email or web address]  For non-scheduling related questions, please contact the cardiac imaging nurse navigator should you have any questions/concerns: Cardiac Imaging Nurse Navigators Direct Office Dial: [phone]   For scheduling needs, including cancellations and rescheduling, please call [place], [phone].

## 2023-06-10 ENCOUNTER — Other Ambulatory Visit: Payer: Self-pay | Admitting: *Deleted

## 2023-06-10 DIAGNOSIS — E039 Hypothyroidism, unspecified: Secondary | ICD-10-CM

## 2023-06-20 ENCOUNTER — Other Ambulatory Visit: Payer: Self-pay

## 2023-06-20 ENCOUNTER — Ambulatory Visit: Payer: 59 | Admitting: Student

## 2023-06-20 VITALS — BP 149/96 | HR 78 | Temp 97.8°F | Ht 62.0 in | Wt 222.5 lb

## 2023-06-20 DIAGNOSIS — R7989 Other specified abnormal findings of blood chemistry: Secondary | ICD-10-CM

## 2023-06-20 DIAGNOSIS — R0683 Snoring: Secondary | ICD-10-CM

## 2023-06-20 DIAGNOSIS — I10 Essential (primary) hypertension: Secondary | ICD-10-CM

## 2023-06-20 DIAGNOSIS — E0590 Thyrotoxicosis, unspecified without thyrotoxic crisis or storm: Secondary | ICD-10-CM | POA: Diagnosis not present

## 2023-06-20 DIAGNOSIS — K219 Gastro-esophageal reflux disease without esophagitis: Secondary | ICD-10-CM

## 2023-06-20 DIAGNOSIS — R1013 Epigastric pain: Secondary | ICD-10-CM

## 2023-06-20 MED ORDER — PANTOPRAZOLE SODIUM 40 MG PO TBEC: 40.0000 mg | DELAYED_RELEASE_TABLET | Freq: Every day | ORAL | 3 refills | Status: AC

## 2023-06-20 NOTE — Assessment & Plan Note (Signed)
>>  ASSESSMENT AND PLAN FOR ABNORMAL THYROID  BLOOD TEST WRITTEN ON 06/21/2023  8:17 AM BY GABINO BOGA, MD  Recently seen by cardiology where she was found to have a low TSH.  Denies symptoms of hyperthyroidism such as diarrhea, heat intolerance, hair loss, etc.  She has had low TSH in the past with a normal free T4.  We will go ahead and recheck her TSH today, and get a free T4.  Denies any herbal supplements.  If her labs are consistent with hypothyroidism, she may benefit from additional workup for Graves' disease such as checking serum antibody levels.

## 2023-06-20 NOTE — Progress Notes (Unsigned)
 Subjective:  CC: Routine follow-up, abnormal thyroid studies  HPI:  [name] is a 58 y.o. person with a past medical history stated below and presents today for the stated chief complaint. Please see problem based assessment and plan for additional details.  Past Medical History:  Diagnosis Date   Adnexal cyst 2011   removed   Anemia    Arthritis    "neck; spine" (08/29/2014)   Asthma    Carpal tunnel syndrome of left wrist    Chronic lower back pain    Complication of anesthesia    difficult to awake   Daily headache 12/2013   "tx'd and no problems anymore" (08/29/2014)   Dyspnea    with asthma attack- none recent   Family history of adverse reaction to anesthesia    "1 sister and both parents hard to wake up"   Heart murmur    "dx'd when I was a teenager; haven't had any problems w/it"   Hip pain, right    History of bronchitis    Hypertension    Urinary incontinence    Urinary urgency    UTI (lower urinary tract infection)     Current Outpatient Medications on File Prior to Visit  Medication Sig Dispense Refill   metoprolol tartrate (LOPRESSOR) 100 MG tablet Take 1 tablet (100 mg total) by mouth once for 1 dose. Take 90-120 minutes prior to scan. Hold for SBP less than 110. 1 tablet 0   ondansetron (ZOFRAN-ODT) 4 MG disintegrating tablet Take 1 tablet (4 mg total) by mouth every 8 (eight) hours as needed for nausea or vomiting. 20 tablet 0   No current facility-administered medications on file prior to visit.    Review of Systems: Please see assessment and plan for pertinent positives and negatives.  Objective:   Vitals:   06/20/23 1307 06/20/23 1332  BP: (!) 156/77 (!) 149/96  Pulse: 83 78  Temp: 97.8 F (36.6 C)   TempSrc: Oral   SpO2: 100%   Weight: 222 lb 8 oz (100.9 kg)   Height: 5\' 2"  (1.575 m)     Physical Exam: Constitutional: Well-appearing ENT: No appreciable thyroid enlargement, goiter, or nodule. Cardiovascular: Regular  rate and rhythm Pulmonary/Chest: lungs clear to auscultation bilaterally Abdominal: soft, non-tender, non-distended Extremities: No edema of the lower extremities bilaterally Psych: Pleasant affect Thought process is linear and is goal-directed.     Assessment & Plan:  Essential hypertension Hypertensive today in clinic into the 150s systolic, repeat blood pressure 149/96.  This is a trend for this patient.  She says she recently saw her cardiologist who told her she may hold off on starting blood pressure medication as her blood pressure look good in office that day.  She is planning to get a coronary CT, and will decide if she wants to take medication following that.  She is hesitant to take diuretic (previously prescribed HCTZ-picked up but never taken).  Discussed that other medications such as amlodipine may be a good option for her if she does not want to be going to the bathroom more often.  Abnormal thyroid blood test Recently seen by cardiology where she was found to have a low TSH.  Denies symptoms of hyperthyroidism such as diarrhea, heat intolerance, hair loss, etc.  She has had low TSH in the past with a normal free T4.  We will go ahead and recheck her TSH today, and get a free T4.  Denies any herbal supplements.   Snoring  Patient endorses snoring at night, hypertension.  STOP-BANG calculated to be 4, will refer this patient for sleep study.  She would be amenable to CPAP should she test positive for sleep apnea.  This may be a good nonmedical option to try to help manage her hypertension.   Patient discussed with Dr. Alver Sorrow MD [facility] Internal Medicine  PGY-1 Pager: [phone]  Phone: [phone] Date 06/20/2023  Time 1:41 PM

## 2023-06-20 NOTE — Patient Instructions (Signed)
 Thank you, Ms.[name] for allowing [place] to provide your care today.  I have ordered the following tests for you:   Lab Orders         TSH         T4, Free       Referrals ordered today:    Referral Orders         Ambulatory referral to Sleep Studies       Follow up: 2-3 months for Routine follow up  Please remember: Please consider starting medication for blood pressure. There are non-diuretic options which would be good treatment for your condition if you would like to discuss further.    We look forward to seeing you next time. Please call our clinic at [phone] if you have any questions or concerns. The best time to call is Monday-Friday from 9am-4pm, but there is someone available 24/7. If after hours or the weekend, call the main hospital number and ask for the Internal Medicine Resident On-Call. If you need medication refills, please notify your pharmacy one week in advance and they will send [place] a request.   Thank you for trusting me with your care. Wishing you the best!  Lovie Macadamia MD [facility]

## 2023-06-20 NOTE — Assessment & Plan Note (Signed)
 Patient endorses snoring at night, hypertension.  STOP-BANG calculated to be 4, will refer this patient for sleep study.  She would be amenable to CPAP should she test positive for sleep apnea.  This may be a good nonmedical option to try to help manage her hypertension.

## 2023-06-20 NOTE — Assessment & Plan Note (Signed)
 Recently seen by cardiology where she was found to have a low TSH.  Denies symptoms of hyperthyroidism such as diarrhea, heat intolerance, hair loss, etc.  She has had low TSH in the past with a normal free T4.  We will go ahead and recheck her TSH today, and get a free T4.  Denies any herbal supplements.

## 2023-06-20 NOTE — Assessment & Plan Note (Signed)
 Hypertensive today in clinic into the 150s systolic, repeat blood pressure 149/96.  This is a trend for this patient.  She says she recently saw her cardiologist who told her she may hold off on starting blood pressure medication as her blood pressure look good in office that day.  She is planning to get a coronary CT, and will decide if she wants to take medication following that.  She is hesitant to take diuretic (previously prescribed HCTZ-picked up but never taken).  Discussed that other medications such as amlodipine may be a good option for her if she does not want to be going to the bathroom more often.

## 2023-06-20 NOTE — Telephone Encounter (Signed)
 Medication sent to pharmacy

## 2023-06-21 ENCOUNTER — Encounter (HOSPITAL_COMMUNITY): Payer: Self-pay

## 2023-06-21 ENCOUNTER — Encounter: Payer: Self-pay | Admitting: Student

## 2023-06-21 LAB — TSH: TSH: 0.21 u[IU]/mL — ABNORMAL LOW (ref 0.450–4.500)

## 2023-06-21 LAB — T4, FREE: Free T4: 1.07 ng/dL (ref 0.82–1.77)

## 2023-06-21 NOTE — Progress Notes (Signed)
 Internal Medicine Clinic Attending  Case discussed with the resident at the time of the visit.  We reviewed the resident's history and exam and pertinent patient test results.  I agree with the assessment, diagnosis, and plan of care documented in the resident's note.   Persistently low TSH with normal free T4. Likely subclinical. Looks like this is a chronic abnormality (13 years). Would ask about biotin supplements at follow up.

## 2023-06-22 ENCOUNTER — Telehealth (HOSPITAL_COMMUNITY): Payer: Self-pay | Admitting: *Deleted

## 2023-06-22 NOTE — Telephone Encounter (Signed)
 Calling patient to ensure she is prepared for her cardiac CT. Patient states she is currently in the hospital with bad reception. She will called back later. Informed her that the instructions were in her mychart.  She verbalized understanding.  Larey Brick RN Navigator Cardiac Imaging [facility] Heart and Vascular Services [phone] Office [phone] Cell

## 2023-06-23 ENCOUNTER — Ambulatory Visit: Payer: Self-pay | Admitting: Student

## 2023-06-23 ENCOUNTER — Ambulatory Visit (HOSPITAL_COMMUNITY): Admission: RE | Admit: 2023-06-23 | Payer: 59 | Source: Ambulatory Visit

## 2023-06-23 DIAGNOSIS — Z20822 Contact with and (suspected) exposure to covid-19: Secondary | ICD-10-CM | POA: Diagnosis not present

## 2023-06-23 DIAGNOSIS — R0602 Shortness of breath: Secondary | ICD-10-CM | POA: Diagnosis not present

## 2023-06-23 NOTE — Telephone Encounter (Signed)
  Chief Complaint: SOB Symptoms: dry cough Frequency: ongoing  Pertinent Negatives: Patient denies CP, fever Disposition: [] ED /[] Urgent Care (no appt availability in office) / [x] Appointment(In office/virtual)/ []  [place] [facility]/ [] Home Care/ [] Refused Recommended Disposition /[] [place] [facility]/ []  Follow-up with PCP Additional Notes: Pt was seen today at [facility] for SOB that began yesterday PM. Pt reports it comes and goes and does not report a known trigger. Pt notes the provider at UC advised her TSH is abnormal and told her she needed to see PCP within 2 days, UC ordered albuterol inhaler as there was no sure cause of the SOB. Pt denies fever. OV unavailable with provider, scheduled in alt office due to acute nature of SOB. This RN educated pt on home care, new-worsening symptoms, when to call back/seek emergent care. Pt verbalized understanding and agrees to plan.    Copied from CRM [id number]. Topic: Clinical - Red Word Triage >> Jun 23, 2023  3:37 PM Philippa Chester F wrote: Red Word that prompted transfer to Nurse Triage: Shortness of Breath Reason for Disposition  [1] MILD difficulty breathing (e.g., minimal/no SOB at rest, SOB with walking, pulse <100) AND [2] NEW-onset or WORSE than normal  Answer Assessment - Initial Assessment Questions 1. RESPIRATORY STATUS: "Describe your breathing?" (e.g., wheezing, shortness of breath, unable to speak, severe coughing)      Shortness of breath 2. ONSET: "When did this breathing problem begin?"      Yesterday evening 3. PATTERN "Does the difficult breathing come and go, or has it been constant since it started?"      Comes and goes 4. SEVERITY: "How bad is your breathing?" (e.g., mild, moderate, severe)    - MILD: No SOB at rest, mild SOB with walking, speaks normally in sentences, can lie down, no retractions, pulse < 100.    - MODERATE: SOB at rest, SOB with minimal exertion and prefers to sit, cannot lie down flat, speaks in phrases,  mild retractions, audible wheezing, pulse 100-120.    - SEVERE: Very SOB at rest, speaks in single words, struggling to breathe, sitting hunched forward, retractions, pulse > 120      mild 6. CARDIAC HISTORY: "Do you have any history of heart disease?" (e.g., heart attack, angina, bypass surgery, angioplasty)      HTN 7. LUNG HISTORY: "Do you have any history of lung disease?"  (e.g., pulmonary embolus, asthma, emphysema)     asthma 8. CAUSE: "What do you think is causing the breathing problem?"      Unknown 9. OTHER SYMPTOMS: "Do you have any other symptoms? (e.g., dizziness, runny nose, cough, chest pain, fever)     Dry cough 10. O2 SATURATION MONITOR:  "Do you use an oxygen saturation monitor (pulse oximeter) at home?" If Yes, ask: "What is your reading (oxygen level) today?" "What is your usual oxygen saturation reading?" (e.g., 95%)       100% at UC today  Protocols used: Breathing Difficulty-A-AH

## 2023-06-24 ENCOUNTER — Ambulatory Visit (INDEPENDENT_AMBULATORY_CARE_PROVIDER_SITE_OTHER): Admitting: Family Medicine

## 2023-06-24 ENCOUNTER — Encounter: Payer: Self-pay | Admitting: Family Medicine

## 2023-06-24 VITALS — BP 144/90 | HR 96 | Temp 99.1°F | Ht 62.0 in | Wt 219.4 lb

## 2023-06-24 DIAGNOSIS — J705 Respiratory conditions due to smoke inhalation: Secondary | ICD-10-CM

## 2023-06-24 DIAGNOSIS — I10 Essential (primary) hypertension: Secondary | ICD-10-CM | POA: Diagnosis not present

## 2023-06-24 DIAGNOSIS — R0600 Dyspnea, unspecified: Secondary | ICD-10-CM | POA: Diagnosis not present

## 2023-06-24 DIAGNOSIS — R7989 Other specified abnormal findings of blood chemistry: Secondary | ICD-10-CM

## 2023-06-24 DIAGNOSIS — J4521 Mild intermittent asthma with (acute) exacerbation: Secondary | ICD-10-CM | POA: Diagnosis not present

## 2023-06-24 DIAGNOSIS — N393 Stress incontinence (female) (male): Secondary | ICD-10-CM

## 2023-06-24 MED ORDER — METHYLPREDNISOLONE SODIUM SUCC 125 MG IJ SOLR
125.0000 mg | Freq: Once | INTRAMUSCULAR | Status: AC
  Administered 2023-06-24: 125 mg via INTRAMUSCULAR

## 2023-06-24 MED ORDER — PREDNISONE 50 MG PO TABS
ORAL_TABLET | ORAL | 0 refills | Status: DC
Start: 2023-06-24 — End: 2023-10-12
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
## 2023-06-25 DIAGNOSIS — N393 Stress incontinence (female) (male): Secondary | ICD-10-CM | POA: Insufficient documentation

## 2023-06-26 ENCOUNTER — Other Ambulatory Visit: Payer: Self-pay

## 2023-06-26 ENCOUNTER — Emergency Department (HOSPITAL_BASED_OUTPATIENT_CLINIC_OR_DEPARTMENT_OTHER): Admitting: Radiology

## 2023-06-26 ENCOUNTER — Emergency Department (HOSPITAL_BASED_OUTPATIENT_CLINIC_OR_DEPARTMENT_OTHER)
Admission: EM | Admit: 2023-06-26 | Discharge: 2023-06-26 | Disposition: A | Discharge status: Home / Self Care | Attending: Emergency Medicine | Admitting: Emergency Medicine

## 2023-06-26 DIAGNOSIS — E669 Obesity, unspecified: Secondary | ICD-10-CM | POA: Insufficient documentation

## 2023-06-26 DIAGNOSIS — R0602 Shortness of breath: Secondary | ICD-10-CM | POA: Diagnosis not present

## 2023-06-26 DIAGNOSIS — Z6841 Body Mass Index (BMI) 40.0 and over, adult: Secondary | ICD-10-CM | POA: Diagnosis not present

## 2023-06-26 DIAGNOSIS — Z91040 Latex allergy status: Secondary | ICD-10-CM | POA: Diagnosis not present

## 2023-06-26 DIAGNOSIS — J45901 Unspecified asthma with (acute) exacerbation: Secondary | ICD-10-CM | POA: Diagnosis not present

## 2023-06-26 DIAGNOSIS — J4521 Mild intermittent asthma with (acute) exacerbation: Secondary | ICD-10-CM | POA: Diagnosis not present

## 2023-06-26 MED ORDER — IPRATROPIUM-ALBUTEROL 0.5-2.5 (3) MG/3ML IN SOLN
3.0000 mL | Freq: Once | RESPIRATORY_TRACT | Status: AC
  Administered 2023-06-26: 3 mL via RESPIRATORY_TRACT
  Filled 2023-06-26: qty 3

## 2023-06-26 MED ORDER — IPRATROPIUM-ALBUTEROL 0.5-2.5 (3) MG/3ML IN SOLN: 3.0000 mL | Freq: Four times a day (QID) | RESPIRATORY_TRACT | 0 refills | Status: DC | PRN
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
## 2023-06-26 NOTE — ED Notes (Signed)
 Pt seen in room 16 by this RT. Bilateral breaths clear and diminished. HR 74, RR 19, SAT 96% on room air. No distress noted.

## 2023-06-26 NOTE — Discharge Instructions (Signed)
 They were seen for an acute asthma exacerbation.  Please pick up your medications and nebulizer and use as prescribed.  Thank you for letting us treat you today. After forming a physical exam and reviewing your imaging, I feel you are safe to go home. Please follow up with your PCP in the next several days and provide them with your records from this visit. Return to the Emergency Room if pain becomes severe or symptoms worsen.

## 2023-07-02 ENCOUNTER — Other Ambulatory Visit: Payer: Self-pay

## 2023-07-02 ENCOUNTER — Encounter (HOSPITAL_BASED_OUTPATIENT_CLINIC_OR_DEPARTMENT_OTHER): Payer: Self-pay | Admitting: Emergency Medicine

## 2023-07-02 ENCOUNTER — Emergency Department (HOSPITAL_BASED_OUTPATIENT_CLINIC_OR_DEPARTMENT_OTHER)

## 2023-07-02 ENCOUNTER — Emergency Department (HOSPITAL_BASED_OUTPATIENT_CLINIC_OR_DEPARTMENT_OTHER)
Admission: EM | Admit: 2023-07-02 | Discharge: 2023-07-02 | Disposition: A | Discharge status: Home / Self Care | Attending: Emergency Medicine | Admitting: Emergency Medicine

## 2023-07-02 DIAGNOSIS — Z91040 Latex allergy status: Secondary | ICD-10-CM | POA: Diagnosis not present

## 2023-07-02 DIAGNOSIS — R0602 Shortness of breath: Secondary | ICD-10-CM | POA: Insufficient documentation

## 2023-07-02 DIAGNOSIS — J984 Other disorders of lung: Secondary | ICD-10-CM | POA: Insufficient documentation

## 2023-07-02 DIAGNOSIS — J680 Bronchitis and pneumonitis due to chemicals, gases, fumes and vapors: Secondary | ICD-10-CM

## 2023-07-02 DIAGNOSIS — J45909 Unspecified asthma, uncomplicated: Secondary | ICD-10-CM | POA: Diagnosis not present

## 2023-07-02 DIAGNOSIS — R059 Cough, unspecified: Secondary | ICD-10-CM | POA: Insufficient documentation

## 2023-07-02 LAB — BASIC METABOLIC PANEL
Anion gap: 7 (ref 5–15)
BUN: 12 mg/dL (ref 6–20)
CO2: 29 mmol/L (ref 22–32)
Calcium: 9 mg/dL (ref 8.9–10.3)
Chloride: 104 mmol/L (ref 98–111)
Creatinine, Ser: 0.92 mg/dL (ref 0.44–1.00)
GFR, Estimated: >60 mL/min (ref >60)
Glucose, Bld: 87 mg/dL (ref 70–99)
Potassium: 4.1 mmol/L (ref 3.5–5.1)
Sodium: 140 mmol/L (ref 135–145)

## 2023-07-02 LAB — RESPIRATORY PANEL BY PCR

## 2023-07-02 LAB — CBC WITH DIFFERENTIAL/PLATELET
Abs Immature Granulocytes: 0.04 10*3/uL (ref 0.00–0.07)
Basophils Absolute: 0 10*3/uL (ref 0.0–0.1)
Basophils Relative: 0 %
Eosinophils Absolute: 0.1 10*3/uL (ref 0.0–0.5)
Eosinophils Relative: 1 %
HCT: 45.4 % (ref 36.0–46.0)
Hemoglobin: 15 g/dL (ref 12.0–15.0)
Immature Granulocytes: 1 %
Lymphocytes Relative: 37 %
Lymphs Abs: 3.3 10*3/uL (ref 0.7–4.0)
MCH: 29.4 pg (ref 26.0–34.0)
MCHC: 33 g/dL (ref 30.0–36.0)
MCV: 88.8 fL (ref 80.0–100.0)
Monocytes Absolute: 0.6 10*3/uL (ref 0.1–1.0)
Monocytes Relative: 7 %
Neutro Abs: 4.8 10*3/uL (ref 1.7–7.7)
Neutrophils Relative %: 54 %
Platelets: 244 10*3/uL (ref 150–400)
RBC: 5.11 MIL/uL (ref 3.87–5.11)
RDW: 13.6 % (ref 11.5–15.5)
WBC: 8.9 10*3/uL (ref 4.0–10.5)
nRBC: 0 % (ref 0.0–0.2)

## 2023-07-02 LAB — RESP PANEL BY RT-PCR (RSV, FLU A&B, COVID)  RVPGX2
Influenza A by PCR: NEGATIVE
Influenza B by PCR: NEGATIVE
Resp Syncytial Virus by PCR: NEGATIVE
SARS Coronavirus 2 by RT PCR: NEGATIVE

## 2023-07-02 LAB — BRAIN NATRIURETIC PEPTIDE: B Natriuretic Peptide: 24 pg/mL (ref 0.0–100.0)

## 2023-07-02 MED ORDER — IOHEXOL 300 MG/ML  SOLN
100.0000 mL | Freq: Once | INTRAMUSCULAR | Status: AC | PRN
  Administered 2023-07-02: 100 mL via INTRAVENOUS

## 2023-07-02 MED ORDER — DEXAMETHASONE SODIUM PHOSPHATE 10 MG/ML IJ SOLN
10.0000 mg | Freq: Once | INTRAMUSCULAR | Status: AC
  Administered 2023-07-02: 10 mg via INTRAVENOUS
  Filled 2023-07-02: qty 1

## 2023-07-02 MED ORDER — IPRATROPIUM-ALBUTEROL 0.5-2.5 (3) MG/3ML IN SOLN
3.0000 mL | Freq: Once | RESPIRATORY_TRACT | Status: AC
  Administered 2023-07-02: 3 mL via RESPIRATORY_TRACT
  Filled 2023-07-02: qty 3

## 2023-07-02 MED ORDER — MAGNESIUM SULFATE 2 GM/50ML IV SOLN
2.0000 g | INTRAVENOUS | Status: AC
  Administered 2023-07-02: 2 g via INTRAVENOUS
  Filled 2023-07-02: qty 50

## 2023-07-02 NOTE — ED Notes (Addendum)
 Patient heart rate 70 while ambulating to the restroom and SpO2 was 97-100% while ambulating to the restroom.

## 2023-07-02 NOTE — Discharge Instructions (Signed)
 1) Your RSV returned and is negative. 2) I will follow up on your extended respiratory pathogen panel and message you through myChart if you have an abnormal finding. 3) I am discharging you with a printout about Pneumonitis which is very similar to Bronchiolitis- please read the attachment 4) follow up with pulmonology especially if your symptoms are not improving over the next few weeks  Contact a health care provider if: You have a fever. Your symptoms get worse. Get help right away if: You have new or worse shortness of breath. You develop a blue color under your fingernails.
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
## 2023-07-06 ENCOUNTER — Ambulatory Visit (HOSPITAL_COMMUNITY)

## 2023-07-19 ENCOUNTER — Encounter: Payer: Self-pay | Admitting: Pulmonary Disease

## 2023-07-19 ENCOUNTER — Ambulatory Visit: Admitting: Pulmonary Disease

## 2023-07-19 VITALS — BP 120/74 | HR 75 | Temp 96.9°F | Ht 62.0 in | Wt 226.4 lb

## 2023-07-19 DIAGNOSIS — R0609 Other forms of dyspnea: Secondary | ICD-10-CM

## 2023-07-19 DIAGNOSIS — J4530 Mild persistent asthma, uncomplicated: Secondary | ICD-10-CM | POA: Diagnosis not present

## 2023-07-19 LAB — NITRIC OXIDE: Nitric Oxide: 18

## 2023-07-19 MED ORDER — BUDESONIDE-FORMOTEROL FUMARATE 160-4.5 MCG/ACT IN AERO: 2.0000 | INHALATION_SPRAY | Freq: Two times a day (BID) | RESPIRATORY_TRACT | 12 refills | Status: AC
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
## 2023-08-17 ENCOUNTER — Telehealth (HOSPITAL_COMMUNITY): Payer: Self-pay | Admitting: *Deleted

## 2023-08-17 NOTE — Telephone Encounter (Signed)
 Reaching out to patient to offer assistance regarding upcoming cardiac imaging study; pt verbalizes understanding of appt date/time, parking situation and where to check in, pre-test NPO status and medications ordered, and verified current allergies; name and call back number provided for further questions should they arise Johney Frame RN Navigator Cardiac Imaging [facility] [phone] office [phone] cell

## 2023-08-18 ENCOUNTER — Ambulatory Visit (HOSPITAL_COMMUNITY)
Admission: RE | Admit: 2023-08-18 | Discharge: 2023-08-18 | Disposition: A | Discharge status: Home / Self Care | Source: Ambulatory Visit | Attending: Nurse Practitioner | Admitting: Nurse Practitioner

## 2023-08-18 DIAGNOSIS — R072 Precordial pain: Secondary | ICD-10-CM | POA: Diagnosis not present

## 2023-08-18 DIAGNOSIS — R051 Acute cough: Secondary | ICD-10-CM | POA: Diagnosis not present

## 2023-08-18 DIAGNOSIS — I2510 Atherosclerotic heart disease of native coronary artery without angina pectoris: Secondary | ICD-10-CM | POA: Diagnosis not present

## 2023-08-18 DIAGNOSIS — R0602 Shortness of breath: Secondary | ICD-10-CM | POA: Diagnosis not present

## 2023-08-18 MED ORDER — IOHEXOL 350 MG/ML SOLN
100.0000 mL | Freq: Once | INTRAVENOUS | Status: AC | PRN
  Administered 2023-08-18: 100 mL via INTRAVENOUS

## 2023-08-18 MED ORDER — DILTIAZEM HCL 25 MG/5ML IV SOLN: 10.0000 mg | INTRAVENOUS | Status: DC | PRN

## 2023-08-18 MED ORDER — METOPROLOL TARTRATE 5 MG/5ML IV SOLN: 10.0000 mg | Freq: Once | INTRAVENOUS | Status: DC | PRN

## 2023-08-18 MED ORDER — NITROGLYCERIN 0.4 MG SL SUBL
0.8000 mg | SUBLINGUAL_TABLET | Freq: Once | SUBLINGUAL | Status: AC
  Administered 2023-08-18: 0.8 mg via SUBLINGUAL

## 2023-08-19 ENCOUNTER — Other Ambulatory Visit: Payer: Self-pay

## 2023-08-19 DIAGNOSIS — E782 Mixed hyperlipidemia: Secondary | ICD-10-CM

## 2023-08-19 MED ORDER — ASPIRIN 81 MG PO TBEC: 81.0000 mg | DELAYED_RELEASE_TABLET | Freq: Every day | ORAL | Status: DC

## 2023-08-19 MED ORDER — ROSUVASTATIN CALCIUM 5 MG PO TABS: 5.0000 mg | ORAL_TABLET | Freq: Every day | ORAL | 3 refills | Status: AC

## 2023-08-29 ENCOUNTER — Ambulatory Visit: Attending: Sleep Medicine

## 2023-08-29 DIAGNOSIS — G479 Sleep disorder, unspecified: Secondary | ICD-10-CM | POA: Insufficient documentation

## 2023-08-29 DIAGNOSIS — R0609 Other forms of dyspnea: Secondary | ICD-10-CM

## 2023-08-29 DIAGNOSIS — R0683 Snoring: Secondary | ICD-10-CM | POA: Diagnosis not present

## 2023-08-30 ENCOUNTER — Encounter: Admitting: Student

## 2023-08-30 ENCOUNTER — Telehealth: Payer: Self-pay | Admitting: *Deleted

## 2023-08-30 NOTE — Telephone Encounter (Signed)
 RTC from patient states spasms happen during sleep.  Has had over the past 2 months.  Level of pain is a 10 out of 10.  Uses Mustard and vinegar.  Usually in her calf muscle.  Would like something sent to the [facility] on [address].

## 2023-08-30 NOTE — Telephone Encounter (Signed)
 RTC to patient -Message left that Clinics had called about her leg spasms. Copied from CRM [id number]. Topic: Clinical - Medication Question >> Aug 30, 2023  3:02 PM [name] wrote: Reason for CRM: Patient is calling because she is requesting a rx for leg spasms and wants a rx sent in at the pharmacy [id number] - [place] ([place]), [place] - [address] [place] ([place]) [place] [address] Phone: [phone] Fax: [phone] Hours: Not open 24 hours, and if you need to call the patient for anything her callback number is [phone].

## 2023-08-30 NOTE — Telephone Encounter (Signed)
 RTC to patient informed her of need for appointment and labs prior to getting medication for her spasms.  Patient is scheduled to be seen 08/31/2023 at 10:15 AM.

## 2023-08-31 ENCOUNTER — Ambulatory Visit (INDEPENDENT_AMBULATORY_CARE_PROVIDER_SITE_OTHER): Admitting: Student

## 2023-08-31 VITALS — BP 132/92 | HR 85 | Temp 98.5°F | Ht 62.0 in | Wt 226.8 lb

## 2023-08-31 DIAGNOSIS — G2581 Restless legs syndrome: Secondary | ICD-10-CM | POA: Diagnosis not present

## 2023-08-31 MED ORDER — PRAMIPEXOLE DIHYDROCHLORIDE 0.125 MG PO TABS: 0.1250 mg | ORAL_TABLET | Freq: Every day | ORAL | 2 refills | Status: AC

## 2023-08-31 NOTE — Patient Instructions (Signed)
 Thank you so much for coming to the clinic today!   Please start taking the pramipexole 2-3 hours before bed. I have also attached some info regarding restless leg syndrome.   If you have any questions please feel free to the call the clinic at anytime at [phone]. It was a pleasure seeing you!  Best, Dr. Carolee Churchman

## 2023-08-31 NOTE — Progress Notes (Signed)
 Internal Medicine Clinic Attending  Case discussed with the resident at the time of the visit.  We reviewed the resident's history and exam and pertinent patient test results.  I agree with the assessment, diagnosis, and plan of care documented in the resident's note.

## 2023-08-31 NOTE — Assessment & Plan Note (Signed)
 Patient endorses a muscle spasm in her right leg nightly. These have been ongoing for the past two months. The pain is 10/10 in nature. These have occurred in the past. These only occur at night when sleeping. They come about randomly. No pain when walking. She taken pramipexole in the past which has helped with her symptoms. Improves at night after mustard and vinegar. No lightheadedness or dizziness.  She had a CBC 2 months ago that did not demonstrate any anemia.  She also had a TSH taken that was low.  However her T4 was within normal limits and she remains asymptomatic.  At this time, we will trial pramipexole as patient notes improvement the past. She stopped taking this medication as her symptoms improved. Will also provide with education regarding noninvasive measures. - Pramipexole 0.125 mg at bedtime  - Provide education regarding noninvasive measures

## 2023-08-31 NOTE — Progress Notes (Signed)
 CC: Muscle spasms in leg  HPI: [name] is a 58 y.o. female living with a history stated below and presents today for muscle spasms in leg. Please see problem based assessment and plan for additional details.  Past Medical History:  Diagnosis Date   Adnexal cyst 2011   removed   Anemia    Arthritis    "neck; spine" (08/29/2014)   Asthma    Carpal tunnel syndrome of left wrist    Chronic lower back pain    Complication of anesthesia    difficult to awake   Daily headache 12/2013   "tx'd and no problems anymore" (08/29/2014)   Dyspnea    with asthma attack- none recent   Family history of adverse reaction to anesthesia    "1 sister and both parents hard to wake up"   Heart murmur    "dx'd when I was a teenager; haven't had any problems w/it"   Hip pain, right    History of bronchitis    Hypertension    Urinary incontinence    Urinary urgency    UTI (lower urinary tract infection)     Current Outpatient Medications on File Prior to Visit  Medication Sig Dispense Refill   aspirin  EC 81 MG tablet Take 1 tablet (81 mg total) by mouth daily. Swallow whole.     budesonide -formoterol  (SYMBICORT ) 160-4.5 MCG/ACT inhaler Inhale 2 puffs into the lungs in the morning and at bedtime. 1 each 12   Ferrous Sulfate  (IRON PO)      ipratropium-albuterol  (DUONEB) 0.5-2.5 (3) MG/3ML SOLN Take 3 mLs by nebulization every 6 (six) hours as needed. 360 mL 0   magnesium  oxide (MAG-OX) 400 MG tablet Take 400 mg by mouth daily.     ondansetron  (ZOFRAN -ODT) 4 MG disintegrating tablet Take 1 tablet (4 mg total) by mouth every 8 (eight) hours as needed for nausea or vomiting. 20 tablet 0   pantoprazole  (PROTONIX ) 40 MG tablet Take 1 tablet (40 mg total) by mouth daily. 30 tablet 3   POTASSIUM PO      predniSONE  (DELTASONE ) 50 MG tablet Take 1 tablet daily for 5 days. (Patient not taking: Reported on 07/19/2023) 5 tablet 0   rosuvastatin  (CRESTOR ) 5 MG tablet Take 1 tablet (5 mg total) by  mouth daily. 30 tablet 3   No current facility-administered medications on file prior to visit.    Family History  Problem Relation Age of Onset   Pancreatic cancer Mother    Cancer Mother    Hypertension Mother    Arthritis Mother    Prostate cancer Father    Cancer Father        Prostate   Hypertension Father    Asthma Father    Allergic rhinitis Father    Arthritis Sister    Scoliosis Sister    Sleep apnea Daughter    Narcolepsy Daughter    Asthma Daughter    Colon cancer Neg Hx    Liver cancer Neg Hx    Stomach cancer Neg Hx    Rectal cancer Neg Hx    Esophageal cancer Neg Hx     Social History   Socioeconomic History   Marital status: Single    Spouse name: Not on file   Number of children: 3   Years of education: Not on file   Highest education level: Not on file  Occupational History   Occupation: Disabled   Occupation: CNA  Tobacco Use   Smoking status: Never  Smokeless tobacco: Never  Vaping Use   Vaping status: Never Used  Substance and Sexual Activity   Alcohol use: No   Drug use: No   Sexual activity: Yes    Birth control/protection: Surgical  Other Topics Concern   Not on file  Social History Narrative   Current Social History 01/03/2019        Patient lives with daughter and 2 yo grandson in a second floor apartment. There are 14 steps with handrails up to the entrance the patient uses.       Patient's method of transportation is personal car.      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      The patient currently disabled.      Identified important Relationships are "My brother's girlfriend, they've been together 20 years."       Pets : None       Interests / Fun: "Walking around the lake, reading"      Current Stressors: "Nothing really. I try not to let things stress me."       Religious / Personal Beliefs: "Christian"       L. Corinna Dickens, RN, BSN       Social Drivers of Health   Financial  Resource Strain: Medium Risk (06/24/2023)   Overall Financial Resource Strain (CARDIA)    Difficulty of Paying Living Expenses: Somewhat hard  Food Insecurity: Food Insecurity Present (06/24/2023)   Hunger Vital Sign    Worried About Running Out of Food in the Last Year: Often true    Ran Out of Food in the Last Year: Sometimes true  Transportation Needs: No Transportation Needs (06/24/2023)   PRAPARE - Administrator, Civil Service (Medical): No    Lack of Transportation (Non-Medical): No  Physical Activity: Insufficiently Active (06/24/2023)   Exercise Vital Sign    Days of Exercise per Week: 2 days    Minutes of Exercise per Session: 20 min  Stress: No Stress Concern Present (09/02/2022)   [employer] of Occupational Health - Occupational Stress Questionnaire    Feeling of Stress : Only a little  Social Connections: Moderately Integrated (06/24/2023)   Social Connection and Isolation Panel [NHANES]    Frequency of Communication with Friends and Family: More than three times a week    Frequency of Social Gatherings with Friends and Family: More than three times a week    Attends Religious Services: More than 4 times per year    Active Member of [employer] or Organizations: Yes    Attends Engineer, structural: More than 4 times per year    Marital Status: Divorced  Intimate Partner Violence: Not At Risk (09/02/2022)   Humiliation, Afraid, Rape, and Kick questionnaire    Fear of Current or Ex-Partner: No    Emotionally Abused: No    Physically Abused: No    Sexually Abused: No    Review of Systems: ROS negative except for what is noted on the assessment and plan.  Vitals:   08/31/23 1050  BP: (!) 132/92  Pulse: 85  Temp: 98.5 F (36.9 C)  TempSrc: Oral  SpO2: 96%  Weight: 226 lb 12.8 oz (102.9 kg)  Height: 5\' 2"  (1.575 m)    Physical Exam: Constitutional: well-appearing in no acute distress HENT: normocephalic atraumatic, mucous membranes moist Eyes:  conjunctiva non-erythematous Neck: supple Cardiovascular: regular rate and rhythm, no m/r/g Pulmonary/Chest: normal work of breathing on room air, lungs clear to  auscultation bilaterally Abdominal: soft, non-tender, non-distended MSK: normal bulk and tone Neurological: alert & oriented x 3, 5/5 strength in bilateral upper and lower extremities, normal gait Skin: warm and dry  Assessment & Plan:   Restless leg syndrome Patient endorses a muscle spasm in her right leg nightly. These have been ongoing for the past two months. The pain is 10/10 in nature. These have occurred in the past. These only occur at night when sleeping. They come about randomly. No pain when walking. She taken pramipexole in the past which has helped with her symptoms. Improves at night after mustard and vinegar. No lightheadedness or dizziness.  She had a CBC 2 months ago that did not demonstrate any anemia.  She also had a TSH taken that was low.  However her T4 was within normal limits and she remains asymptomatic.  At this time, we will trial pramipexole as patient notes improvement the past. She stopped taking this medication as her symptoms improved. Will also provide with education regarding noninvasive measures. - Pramipexole 0.125 mg at bedtime  - Provide education regarding noninvasive measures  Patient discussed with Dr. Brigitte Canard, MD  [facility] Internal Medicine, PGY-1 Date 08/31/2023 Time 11:27 AM

## 2023-09-06 DIAGNOSIS — G4730 Sleep apnea, unspecified: Secondary | ICD-10-CM

## 2023-09-06 DIAGNOSIS — R0683 Snoring: Secondary | ICD-10-CM

## 2023-09-08 ENCOUNTER — Ambulatory Visit: Payer: 59 | Admitting: Nurse Practitioner

## 2023-09-13 ENCOUNTER — Ambulatory Visit (INDEPENDENT_AMBULATORY_CARE_PROVIDER_SITE_OTHER): Payer: Self-pay | Admitting: Pulmonary Disease

## 2023-10-11 ENCOUNTER — Ambulatory Visit: Payer: Self-pay

## 2023-10-11 NOTE — Telephone Encounter (Signed)
 Pt has an appt with Dr Nguyen on 10/12/23.

## 2023-10-11 NOTE — Telephone Encounter (Signed)
 FYI Only or Action Required?: FYI only for provider.  Patient was last seen in primary care on 08/31/2023 by Skarleth Delmonico Freund, MD. Called Nurse Triage reporting Vaginal Discharge and hands tingling. Symptoms began several weeks ago. Interventions attempted: Prescription medications: aspirin  81mg . Symptoms are: brown vaginal discharge, bilateral hand tingling (constant in left hand, intermittent right hand), right shoulder pain, white itchy spots to arms and chest, chest pains last few seconds intermittent (states she saw the cardiologist for it) unchanged.  Triage Disposition: See PCP When Office is Open (Within 3 Days)  Patient/caregiver understands and will follow disposition?: Yes                      Copied from CRM [id number]. Topic: Clinical - Red Word Triage >> Oct 11, 2023  1:36 PM Miquel SAILOR wrote: Red Word that prompted transfer to Nurse Triage: Possible UTI Symptoms Tiggling in hands for 3 weeks and feels worse Reason for Disposition  Abnormal color vaginal discharge (i.e., yellow, green, gray)  [1] Numbness or tingling in one or both hands AND [2] is a chronic symptom (recurrent or ongoing AND present > 4 weeks)  Answer Assessment - Initial Assessment Questions 1. DISCHARGE: Describe the discharge. (e.g., white, yellow, green, gray, foamy, cottage cheese-like)     Brown.  2. ODOR: Is there a bad odor?     No.  3. ONSET: When did the discharge begin?     X1-1.5 weeks.  4. RASH: Is there a rash in the genital area? If Yes, ask: Describe it. (e.g., redness, blisters, sores, bumps)     No.  5. ABDOMEN PAIN: Are you having any abdomen pain? If Yes, ask: What does it feel like?  (e.g., crampy, dull, intermittent, constant)      Intermittent, then states that's something that's been going on for a lifetime.  6. ABDOMEN PAIN SEVERITY: If present, ask: How bad is it? (e.g., Scale 1-10; mild, moderate, or severe)   - MILD (1-3): Doesn't interfere with  normal activities, abdomen soft and not tender to touch.    - MODERATE (4-7): Interferes with normal activities or awakens from sleep, abdomen tender to touch.    - SEVERE (8-10): Excruciating pain, doubled over, unable to do any normal activities. (R/O peritonitis)      0/10 at this time.  7. CAUSE: What do you think is causing the discharge? Have you had the same problem before? What happened then?     Patient states similar symptoms in the past and she is unsure what is causing it, states she is premenopausal as well and unsure if that is causing it.  8. OTHER SYMPTOMS: Do you have any other symptoms? (e.g., fever, itching, vaginal bleeding, pain with urination, injury to genital area, vaginal foreign body)     Patient denies fever, burning or painful urination.  9. PREGNANCY: Is there any chance you are pregnant? When was your last menstrual period?     N/A.  Answer Assessment - Initial Assessment Questions 1. SYMPTOM: What is the main symptom you are concerned about? (e.g., weakness, numbness)     Tingling in hands.  2. ONSET: When did this start? (minutes, hours, days; while sleeping)     X 3 weeks for left hand, x 2 weeks right hand.  3. LAST NORMAL: When was the last time you (the patient) were normal (no symptoms)?     3 weeks ago.  4. PATTERN Does this come and go, or has it been  constant since it started?  Is it present now?     Left hand is constant and right hand comes and goes.  5. CARDIAC SYMPTOMS: Have you had any of the following symptoms: chest pain, difficulty breathing, palpitations?     Patient states she had chest pain and was seen by her cardiologist and told to take 81 mg aspirin . She states she had an episode yesterday and it lasts only a few seconds.  6. NEUROLOGIC SYMPTOMS: Have you had any of the following symptoms: headache, dizziness, vision loss, double vision, changes in speech, unsteady on your feet?     Patient denies vision  loss, double vision, changes in speech, dizziness or unsteady on feet.  7. OTHER SYMPTOMS: Do you have any other symptoms?     Patient denies neck or back pain. She states she thinks she got stung by something on her right shoulder and has pain in the right shoulder. White, itchy spots on arms and chest about the size of an eraser x 2 months.   8. PREGNANCY: Is there any chance you are pregnant? When was your last menstrual period?     N/A.  Protocols used: Vaginal Discharge-A-AH, Neurologic Deficit-A-AH

## 2023-10-12 ENCOUNTER — Ambulatory Visit

## 2023-10-12 ENCOUNTER — Other Ambulatory Visit (HOSPITAL_COMMUNITY)
Admission: RE | Admit: 2023-10-12 | Discharge: 2023-10-12 | Disposition: A | Discharge status: Home / Self Care | Source: Ambulatory Visit | Attending: Internal Medicine | Admitting: Internal Medicine

## 2023-10-12 VITALS — BP 131/79 | HR 84 | Temp 98.3°F | Ht 62.0 in | Wt 226.8 lb

## 2023-10-12 DIAGNOSIS — G5603 Carpal tunnel syndrome, bilateral upper limbs: Secondary | ICD-10-CM | POA: Diagnosis not present

## 2023-10-12 DIAGNOSIS — N898 Other specified noninflammatory disorders of vagina: Secondary | ICD-10-CM | POA: Insufficient documentation

## 2023-10-12 DIAGNOSIS — B360 Pityriasis versicolor: Secondary | ICD-10-CM | POA: Diagnosis not present

## 2023-10-12 MED ORDER — SELENIUM SULFIDE 2.5 % EX LOTN: 1.0000 | TOPICAL_LOTION | Freq: Every day | CUTANEOUS | 12 refills | Status: AC | PRN

## 2023-10-12 NOTE — Assessment & Plan Note (Signed)
 One week of brown vaginal discharge noted only while wiping after using the bathroom. She denies any discharge soiling her underwear. She denies burning or pain with urination, itchiness, foul odor or change in odor of discharge. She does have one female sex partner, who she states has multiple sex partners, and they use condoms sometimes. She would like STI testing today. Last Pap from Sept. 2024 was normal. Advised patient that she should also follow up with her OBGYN for annual visit due coming up. She would like her results through MyChart.  - Self swab test for GC/CT, trichomonas, bacterial vaginitis, candida  - HIV test

## 2023-10-12 NOTE — Assessment & Plan Note (Signed)
 Patient has a history of carpal tunnel syndrome of her left wrist. Within the past month or so, she has been experiencing recurrence of her symptoms. She notes it feels very similar to her prior flares. She has worn a wrist brace for it in the past, but states she no longer has it after she moved. On physical exam, numbness of first 3 digits without extension proximally up her arm. Full strength ABD/ADD of digits, full strength without reproduction of pain when performing upper extremity muscle strength tests. She also notes that her right hand is beginning to experience similar symptoms. Today have provided her with AAOS carpal tunnel exercises and stretches to perform at home. Also showed her the type of brace she would be looking for if she chose to re-purchase this. If symptoms worsen or do not improve, will consider referral to OT and possible steroid dose pack. - home carpal tunnel exercises provided

## 2023-10-12 NOTE — Assessment & Plan Note (Signed)
 Patient has been experiencing rash on her arms and chest for the past few months, which only just recently started itching. Rash appears non-raised, without scaling, scattered areas of hypopigmented macules on bilateral lower arms and chest. Appears to be consistent with tinea caused by malassezia. She has a hive allergy to fluconazole , so we will avoid topical azoles. Will provide prescription for topical selenium sulfide lotion to her pharmacy. - Selenium sulfide 2.5% lotion

## 2023-10-12 NOTE — Addendum Note (Signed)
 Addended by: ANTONE DWAYNE SAILOR on: 10/12/2023 05:12 PM   Modules accepted: Orders

## 2023-10-12 NOTE — Patient Instructions (Addendum)
 Brandi Chase, it was a pleasure to meet you today. As we discussed, in regards to your carpal tunnel flair, I have provided you the print out of home exercises to work on. If you choose to buy a wrist splint, they have a basic one at [facility]. You may wear this at night, but if it is uncomfortable, you can go without it while sleeping. If the symptoms persist, we may need to refer you to OT (occupational therapy) and consider other treatment plans.   Once we receive your lab results from today, I will reach out to you with the results. I would like for you to schedule your annual exam with your OBGYN, which should be due in September this year.   In terms of your rash, it does appear to be a common infection that we see especially in the summer months. I will send a topical prescription for you to use to help clear this up.   Please don't hesitate to reach out to use with any questions!  -Dr. Debralee Braaksma
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
## 2023-10-13 ENCOUNTER — Ambulatory Visit: Payer: Self-pay

## 2023-10-13 ENCOUNTER — Other Ambulatory Visit

## 2023-10-13 DIAGNOSIS — N898 Other specified noninflammatory disorders of vagina: Secondary | ICD-10-CM | POA: Diagnosis not present

## 2023-10-13 LAB — CERVICOVAGINAL ANCILLARY ONLY
Bacterial Vaginitis (gardnerella): NEGATIVE
Candida Glabrata: NEGATIVE
Candida Vaginitis: POSITIVE — AB
Chlamydia: NEGATIVE
Comment: NEGATIVE
Comment: NEGATIVE
Comment: NEGATIVE
Comment: NEGATIVE
Comment: NEGATIVE
Comment: NORMAL
Neisseria Gonorrhea: NEGATIVE
Trichomonas: NEGATIVE

## 2023-10-14 LAB — HIV ANTIBODY (ROUTINE TESTING W REFLEX): HIV Screen 4th Generation wRfx: NONREACTIVE

## 2023-10-17 ENCOUNTER — Ambulatory Visit: Payer: Self-pay

## 2023-10-17 NOTE — Progress Notes (Signed)
 Internal Medicine Clinic Attending  I was physically present during the key portions of the resident provided service and participated in the medical decision making of patient's management care. I reviewed pertinent patient test results.  The assessment, diagnosis, and plan were formulated together and I agree with the documentation in the resident's note. Rash c/w tinea versicolor.  Trudy Mliss Dragon, MD

## 2023-10-17 NOTE — Telephone Encounter (Signed)
 Patient reports she does not recall the name of the medication she has previously used. She notes Flagyl  sounds familiar but is unsure. Routing to provider for follow up.      Copied from CRM [id number]. Topic: Clinical - Medical Advice >> Oct 17, 2023 12:22 PM Brittney F wrote: Reason for CRM:   Patient is calling in stating that she did see on Mychart that she does have a yeast infection yet nothing has been prescribed to treat infection. Patient did relay to provider that she is allergic fluconazole  but was informed during her visit (10/12/2023) that an alternative medication would be prescribed. Patient has not yet seen a result of this request. Please contact patient with prescription to treat her yeast infection.    Callback Number: [id number]

## 2023-10-17 NOTE — Telephone Encounter (Signed)
 Reason for Disposition . [1] Caller requesting NON-URGENT health information AND [2] PCP's office is the best resource  Answer Assessment - Initial Assessment Questions 1. REASON FOR CALL or QUESTION: What is your reason for calling today? or How can I best help you? or What question do you have that I can help answer?     Patient calling for script  Protocols used: Information Only Call - No Triage-A-AH

## 2023-10-18 ENCOUNTER — Telehealth: Payer: Self-pay

## 2023-10-18 ENCOUNTER — Other Ambulatory Visit: Payer: Self-pay

## 2023-10-18 MED ORDER — MICONAZOLE NITRATE 200 MG VA SUPP: 200.0000 mg | Freq: Every day | VAGINAL | 0 refills | Status: AC

## 2023-10-18 NOTE — Telephone Encounter (Signed)
 Was able to finally speak to the patient on the phone and she said that she has previously taken Flagyl  and Monistat  without issue. Advised her that flagyl  is not for yeast so I would send Miconazole  suppository rx to her pharmacy.

## 2023-10-18 NOTE — Progress Notes (Unsigned)
 Patient called back

## 2023-10-25 ENCOUNTER — Ambulatory Visit: Payer: Self-pay

## 2023-10-25 NOTE — Telephone Encounter (Signed)
 Call to pharmacy to check on patient's prescription for the yeast medication.  Prescription for the Miconazole  was sent to [facility] by physician. Spoke with Pharmacist who stated that it is over the counter and did not cross over when submitted.  Advised patient to get Monistat  3 which is the OTC version of the medication.  Patient was called and informed of. Cost to patient per the Pharmacist should be around $3 for the 3 day supply.  Patient voiced understanding of .

## 2023-10-25 NOTE — Telephone Encounter (Signed)
 Patient states that she has been waiting on a prescription from her pharmacy for a yeast infection and she called the pharmacy today and they advised they did not have it.   miconazole  (MICOTIN) 200 MG vaginal suppository [[id number]]  --this is the medication sent on 10/18/2023 that patient states she hasn't heard anything about   FYI Only or Action Required?: Action required by provider: Patient called and advised she has been waiting on a prescription for yeast infection and the pharmacy told her today that they did not have it.  Patient was last seen in primary care on 10/12/2023 by Leontine Lapine, MD.  Called Nurse Triage reporting Medication Refill Problem.   Triage Disposition: See PCP When Office is Open (Within 3 Days)  Patient/caregiver understands and will follow disposition?: No, wishes to speak with PCP                  Reason for Disposition  Prescription request for new medicine (not a refill)  Answer Assessment - Initial Assessment Questions Patient states that she just called the pharmacy and they still did not have this prescription in.    miconazole  (MICOTIN) 200 MG vaginal suppository [[id number]]   Order Details Dose: 200 mg Route: Vaginal Frequency: Daily at bedtime Dispense Quantity: 3 suppository (3 day supply) Refills: 0  Duration: -- Dispense As Written: No  Note to Pharmacy: Patient reports that she has taken OTC miconazole  suppository without issue Indications of Use: Vulvovaginal Candidiasis     Sig: Place 1 suppository (200 mg total) vaginally at bedtime.     Start Date: 10/18/23 End Date: -- Written Date: 10/18/23 Expiration Date: 10/17/24       Sent to pharmacy as: miconazole  (MICOTIN) 200 MG vaginal suppository  Notes to Pharmacy: Patient reports that she has taken OTC miconazole  suppository without issue  E-Prescribing Status: Receipt confirmed by pharmacy (10/18/2023  3:51 PM EDT)  Protocols used: Medication Refill and Renewal  Call-A-AH

## 2023-11-04 ENCOUNTER — Telehealth: Payer: Self-pay

## 2023-11-04 NOTE — Progress Notes (Signed)
 This patient is appearing on a report for being at risk of failing the adherence measure for cholesterol (statin) medications this calendar year.   Medication: rosuvastatin  5 mg daily Last fill date: 10/30/23 for 30 day supply  Insurance report was not up to date. No action needed at this time.   Lorain Baseman, PharmD [facility] [phone]

## 2023-11-10 ENCOUNTER — Telehealth (INDEPENDENT_AMBULATORY_CARE_PROVIDER_SITE_OTHER): Payer: Self-pay | Admitting: Sleep Medicine

## 2023-11-10 NOTE — Telephone Encounter (Signed)
 PSG did not reveal sleep apnea. Results forwarded to ordering provider.

## 2023-11-14 ENCOUNTER — Telehealth: Payer: Self-pay | Admitting: *Deleted

## 2023-11-14 NOTE — Telephone Encounter (Signed)
 Call to patient stated rash is better but is still itching a lot.  Will call patient tomorrow tomorrow to schedule an appointment.

## 2023-11-14 NOTE — Telephone Encounter (Signed)
 Will forward to PCP.  Copied from CRM #[id number]. Topic: Clinical - Medical Advice >> Nov 11, 2023  4:48 PM Graeme ORN wrote: Reason for CRM: Patient called. Seen 7/1 prescribed cream for itching on arms. Medication complete. Patient still having symptoms. Thank You

## 2023-11-16 ENCOUNTER — Ambulatory Visit: Admitting: Student

## 2023-11-16 VITALS — BP 150/92 | HR 75 | Temp 97.7°F | Wt 233.0 lb

## 2023-11-16 DIAGNOSIS — Z23 Encounter for immunization: Secondary | ICD-10-CM

## 2023-11-16 DIAGNOSIS — B360 Pityriasis versicolor: Secondary | ICD-10-CM

## 2023-11-16 DIAGNOSIS — I10 Essential (primary) hypertension: Secondary | ICD-10-CM

## 2023-11-16 DIAGNOSIS — Z0000 Encounter for general adult medical examination without abnormal findings: Secondary | ICD-10-CM

## 2023-11-16 NOTE — Assessment & Plan Note (Signed)
Prevnar today.

## 2023-11-16 NOTE — Assessment & Plan Note (Addendum)
 BP is 168/105 and 150/92. Prescribed hydrochlorothiazide  in the past but has never started taking it. I suspect she will need medication given that this has persisted but she is hesitant to begin any anti-hypertensive.  Denies HA/visual changes. I provided information regarding DASH diet and she was instructed to record ambulatory readings over the next week and send me MyChart message. Further counseled on the importance of regular CV exercise.

## 2023-11-16 NOTE — Progress Notes (Signed)
 CC: Follow-up  HPI:  Brandi Chase is a 58 y.o. female living with a history stated below and presents today for follow-up. Please see problem based assessment and plan for additional details.  Past Medical History:  Diagnosis Date   Adnexal cyst 2011   removed   Anemia    Arthritis    neck; spine (08/29/2014)   Asthma    Carpal tunnel syndrome of left wrist    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, left 03/15/2019   Chronic lower back pain    Complication of anesthesia    difficult to awake   COVID 08/13/2020   Daily headache 12/2013   tx'd and no problems anymore (08/29/2014)   Dyspnea    with asthma attack- none recent   Family history of adverse reaction to anesthesia    1 sister and both parents hard to wake up   H/O pelvic mass 05/24/2013   Heart murmur    dx'd when I was a teenager; haven't had any problems w/it   Hip pain, acute 07/22/2021   Hip pain, right    History of bronchitis    Hypertension    Iron deficiency anemia 03/29/2018   Pruritus 06/08/2021   Ruptured cyst of ovary 10/26/2019   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 1 each 12   Ferrous Sulfate  (IRON PO)      magnesium  oxide (MAG-OX) 400 MG tablet Take 400 mg by mouth daily.     miconazole  (MICOTIN) 200 MG vaginal suppository Place 1 suppository (200 mg total) vaginally at bedtime. 3 suppository 0   ondansetron  (ZOFRAN -ODT) 4 MG disintegrating tablet Take 1 tablet (4 mg total) by mouth every 8 (eight) hours as needed for nausea or vomiting. 20 tablet 0   pantoprazole  (PROTONIX ) 40 MG tablet Take 1 tablet (40 mg total) by mouth  daily. 30 tablet 3   POTASSIUM PO      pramipexole  (MIRAPEX ) 0.125 MG tablet Take 1 tablet (0.125 mg total) by mouth at bedtime. 30 tablet 2   rosuvastatin  (CRESTOR ) 5 MG tablet Take 1 tablet (5 mg total) by mouth daily. 30 tablet 3   selenium  sulfide (SELSUN ) 2.5 % lotion Apply 1 Application topically daily as needed for irritation. 118 mL 12   No current facility-administered medications on file prior to visit.    Family History  Problem Relation Age of Onset   Pancreatic cancer Mother    Cancer Mother    Hypertension Mother    Arthritis Mother    Prostate cancer Father    Cancer Father        Prostate   Hypertension Father    Asthma Father    Allergic rhinitis Father    Arthritis Sister    Scoliosis Sister    Sleep apnea Daughter    Narcolepsy Daughter    Asthma Daughter    Colon cancer Neg Hx    Liver cancer Neg Hx    Stomach cancer Neg Hx    Rectal cancer Neg Hx    Esophageal cancer Neg Hx     Social History   Socioeconomic  History   Marital status: Single    Spouse name: Not on file   Number of children: 3   Years of education: Not on file   Highest education level: Not on file  Occupational History   Occupation: Disabled   Occupation: CNA  Tobacco Use   Smoking status: Never   Smokeless tobacco: Never  Vaping Use   Vaping status: Never Used  Substance and Sexual Activity   Alcohol use: No   Drug use: No   Sexual activity: Yes    Birth control/protection: Surgical  Other Topics Concern   Not on file  Social History Narrative   Current Social History 01/03/2019        Patient lives with daughter and 2 yo grandson in a second floor apartment. There are 14 steps with handrails up to the entrance the patient uses.       Patient's method of transportation is personal car.      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      The patient currently disabled.      Identified important Relationships are My brother's  girlfriend, they've been together 20 years.       Pets : None       Interests / Fun: [place], reading      Current Stressors: Nothing really. I try not to let things stress me.       Religious / Personal Beliefs: Christian       L. Jori, RN, BSN       Social Drivers of Health   Financial Resource Strain: Medium Risk (06/24/2023)   Overall Financial Resource Strain (CARDIA)    Difficulty of Paying Living Expenses: Somewhat hard  Food Insecurity: Food Insecurity Present (06/24/2023)   Hunger Vital Sign    Worried About Running Out of Food in the Last Year: Often true    Ran Out of Food in the Last Year: Sometimes true  Transportation Needs: No Transportation Needs (06/24/2023)   PRAPARE - Administrator, Civil Service (Medical): No    Lack of Transportation (Non-Medical): No  Physical Activity: Insufficiently Active (06/24/2023)   Exercise Vital Sign    Days of Exercise per Week: 2 days    Minutes of Exercise per Session: 20 min  Stress: No Stress Concern Present (09/02/2022)   [employer] of Occupational Health - Occupational Stress Questionnaire    Feeling of Stress : Only a little  Social Connections: Moderately Integrated (06/24/2023)   Social Connection and Isolation Panel    Frequency of Communication with Friends and Family: More than three times a week    Frequency of Social Gatherings with Friends and Family: More than three times a week    Attends Religious Services: More than 4 times per year    Active Member of [employer] or Organizations: Yes    Attends Engineer, structural: More than 4 times per year    Marital Status: Divorced  Intimate Partner Violence: Not At Risk (09/02/2022)   Humiliation, Afraid, Rape, and Kick questionnaire    Fear of Current or Ex-Partner: No    Emotionally Abused: No    Physically Abused: No    Sexually Abused: No    Review of Systems: ROS negative except for what is noted on the assessment  and plan.  Vitals:   11/16/23 0830 11/16/23 0836  BP: (!) 168/105 (!) 150/92  Pulse: 77 75  Temp: 97.7 F (36.5  C)   TempSrc: Oral   SpO2: 98%   Weight: 233 lb (105.7 kg)     Physical Exam: Constitutional: well-appearing, sitting in chair, in no acute distress Cardiovascular: regular rate and rhythm, no m/r/g Pulmonary/Chest: normal work of breathing on room air, lungs clear to auscultation bilaterally Skin: dry skin on bilateral forearms Psych: normal mood and behavior  Assessment & Plan:     Patient discussed with Dr. CHARLENA Eastern  Essential hypertension BP is 168/105 and 150/92. Prescribed hydrochlorothiazide  in the past but has never started taking it. I suspect she will need medication given that this has persisted but she is hesitant to begin any anti-hypertensive.  Denies HA/visual changes. I provided information regarding DASH diet and she was instructed to record ambulatory readings over the next week and send me MyChart message. Further counseled on the importance of regular CV exercise.  Malassezia furfur causing tinea Seen for this last month with selenium  sulfide prescribed.  Rash has improved considerably but she has mild itching on bilateral arms where selenium  sulfide was applied.  Exam consistent with mild skin dryness/irritation.  Advised her to discontinue selenium  sulfide and use hydrating creams such as Eucerin.   Healthcare maintenance Prevnar today.   Norman Lobstein, D.O. [facility], PGY-2 Phone: [phone] Date 11/16/2023 Time 11:12 AM

## 2023-11-16 NOTE — Assessment & Plan Note (Addendum)
 Seen for this last month with selenium  sulfide prescribed.  Rash has improved considerably but she has mild itching on bilateral arms where selenium  sulfide was applied.  Exam consistent with mild skin dryness/irritation.  Advised her to discontinue selenium  sulfide and use hydrating creams such as Eucerin.

## 2023-11-17 NOTE — Progress Notes (Signed)
 Internal Medicine Clinic Attending  Case discussed with the resident at the time of the visit.  We reviewed the resident's history and exam and pertinent patient test results.  I agree with the assessment, diagnosis, and plan of care documented in the resident's note.

## 2023-11-18 ENCOUNTER — Telehealth: Payer: Self-pay | Admitting: Student

## 2023-11-18 NOTE — Telephone Encounter (Signed)
 Copied from CRM [id number]. Topic: Clinical - Medication Refill >> Nov 18, 2023 11:28 AM Carmell R wrote: Medication: ondansetron  (ZOFRAN -ODT) 4 MG disintegrating tablet  Has the patient contacted their pharmacy? No, but rx was sent with no refills.  This is the patient's preferred pharmacy:  [facility] [address] ([address] [place] [address] [place] ([place]) [place] [address] Phone: [phone] Fax: [phone]  Is this the correct pharmacy for this prescription? Yes If no, delete pharmacy and type the correct one.   Has the prescription been filled recently? Yes  Is the patient out of the medication? No, but has one left  Has the patient been seen for an appointment in the last year OR does the patient have an upcoming appointment? Yes  Can we respond through MyChart? Yes  Agent: Please be advised that Rx refills may take up to 3 business days. We ask that you follow-up with your pharmacy.

## 2023-11-18 NOTE — Telephone Encounter (Signed)
 Call too patient to see why she is in need of the Zofran  refill.  To check to see how she is feeling.

## 2023-11-22 MED ORDER — ONDANSETRON 4 MG PO TBDP: 4.0000 mg | ORAL_TABLET | Freq: Three times a day (TID) | ORAL | 0 refills | Status: DC | PRN

## 2023-11-22 NOTE — Telephone Encounter (Signed)
 Pt called back stating no  one had called her back I explained the Gladys Banker )  did call but she didn't not pick  up .SABRA So I asked why did she need to refills and how was she feeling she stated that feeling sick to her stomach  for 2 wks now and the last time she was seen she told the dr she only had 2 pills left .SABRASABRASABRA

## 2023-12-05 ENCOUNTER — Ambulatory Visit

## 2023-12-05 VITALS — Ht 62.0 in | Wt 230.0 lb

## 2023-12-05 DIAGNOSIS — Z0000 Encounter for general adult medical examination without abnormal findings: Secondary | ICD-10-CM | POA: Diagnosis not present

## 2023-12-05 NOTE — Progress Notes (Signed)
 Because this visit was a virtual/telehealth visit,  certain criteria was not obtained, such a blood pressure, CBG if applicable, and timed get up and go. Any medications not marked as taking were not mentioned during the medication reconciliation part of the visit. Any vitals not documented were not able to be obtained due to this being a telehealth visit or patient was unable to self-report a recent blood pressure reading due to a lack of equipment at home via telehealth. Vitals that have been documented are verbally provided by the patient.   Subjective:   Brandi Chase is a 58 y.o. who presents for a Medicare Wellness preventive visit.  As a reminder, Annual Wellness Visits don't include a physical exam, and some assessments may be limited, especially if this visit is performed virtually. We may recommend an in-person follow-up visit with your provider if needed.  Visit Complete: Virtual I connected with  Brandi Chase on 12/05/23 by a audio enabled telemedicine application and verified that I am speaking with the correct person using two identifiers.  Patient Location: Home  Provider Location: Home Office  I discussed the limitations of evaluation and management by telemedicine. The patient expressed understanding and agreed to proceed.  Vital Signs: Because this visit was a virtual/telehealth visit, some criteria may be missing or patient reported. Any vitals not documented were not able to be obtained and vitals that have been documented are patient reported.  VideoDeclined- This patient declined Librarian, academic. Therefore the visit was completed with audio only.  Persons Participating in Visit: Patient.  AWV Questionnaire: No: Patient Medicare AWV questionnaire was not completed prior to this visit.  Cardiac Risk Factors include: advanced age (>72men, >51 women);sedentary lifestyle;family history of premature cardiovascular  disease;hypertension;obesity (BMI >30kg/m2)     Objective:    Today's Vitals   12/05/23 1547 12/05/23 1548  Weight: 230 lb (104.3 kg)   Height: 5' 2 (1.575 m)   PainSc: 0-No pain 0-No pain   Body mass index is 42.07 kg/m.     12/05/2023    3:50 PM 07/02/2023   12:49 PM 06/26/2023    6:04 PM 01/20/2023    9:09 AM 12/31/2022   12:28 PM 12/31/2022    9:50 AM 11/20/2022    4:57 PM  Advanced Directives  Does Patient Have a Medical Advance Directive? No No No No No No No  Would patient like information on creating a medical advance directive? No - Patient declined   Yes (ED - Information included in AVS)       Current Medications (verified) Outpatient Encounter Medications as of 12/05/2023  Medication Sig   budesonide -formoterol  (SYMBICORT ) 160-4.5 MCG/ACT inhaler Inhale 2 puffs into the lungs in the morning and at bedtime.   Ferrous Sulfate  (IRON PO)    magnesium  oxide (MAG-OX) 400 MG tablet Take 400 mg by mouth daily.   miconazole  (MICOTIN) 200 MG vaginal suppository Place 1 suppository (200 mg total) vaginally at bedtime.   ondansetron  (ZOFRAN -ODT) 4 MG disintegrating tablet Take 1 tablet (4 mg total) by mouth every 8 (eight) hours as needed for nausea or vomiting.   pantoprazole  (PROTONIX ) 40 MG tablet Take 1 tablet (40 mg total) by mouth daily.   POTASSIUM PO    pramipexole  (MIRAPEX ) 0.125 MG tablet Take 1 tablet (0.125 mg total) by mouth at bedtime.   rosuvastatin  (CRESTOR ) 5 MG tablet Take 1 tablet (5 mg total) by mouth daily.   selenium  sulfide (SELSUN ) 2.5 % lotion Apply 1  Application topically daily as needed for irritation.   No facility-administered encounter medications on file as of 12/05/2023.    Allergies (verified) Latex, Other, Pecan extract, Primatene  mist [epinephrine ], Shrimp [shellfish allergy], Sulfamethoxazole w/trimethoprim (co-trimoxazole), Aleve [naproxen sodium], Midol  [ibuprofen ], Tylenol  [acetaminophen ], Avocado, Cocoa butter, and Diflucan  [fluconazole ]    History: Past Medical History:  Diagnosis Date   Adnexal cyst 2011   removed   Anemia    Arthritis    neck; spine (08/29/2014)   Asthma    Carpal tunnel syndrome of left wrist    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, left 03/15/2019   Chronic lower back pain    Complication of anesthesia    difficult to awake   COVID 08/13/2020   Daily headache 12/2013   tx'd and no problems anymore (08/29/2014)   Dyspnea    with asthma attack- none recent   Family history of adverse reaction to anesthesia    1 sister and both parents hard to wake up   H/O pelvic mass 05/24/2013   Heart murmur    dx'd when I was a teenager; haven't had any problems w/it   Hip pain, acute 07/22/2021   Hip pain, right    History of bronchitis    Hypertension    Iron deficiency anemia 03/29/2018   Pruritus 06/08/2021   Ruptured cyst of ovary 10/26/2019   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   On RIght,  Removed with salpingectomy 2011     Stye 09/02/2022   Suspected UTI 09/08/2022   Urinary incontinence    Urinary urgency    UTI (lower urinary tract infection)    Past Surgical History:  Procedure Laterality Date   BACK SURGERY     x2   JOINT REPLACEMENT Right 11/2015   hip   OVARIAN CYST REMOVAL Right 2011   REDUCTION MAMMAPLASTY Bilateral 1995   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   Procedure: TOTAL HIP ARTHROPLASTY ANTERIOR APPROACH;  Surgeon: Dempsey Sensor, MD;  Location: [facility] OR;  Service: Orthopedics;  Laterality: Right;   TOTAL HIP ARTHROPLASTY Left 07/19/2016   Procedure: TOTAL HIP ARTHROPLASTY ANTERIOR APPROACH;  Surgeon: Dempsey Sensor, MD;  Location: [facility] OR;  Service: Orthopedics;  Laterality: Left;   TRANSFORAMINAL LUMBAR INTERBODY FUSION (TLIF) WITH PEDICLE SCREW FIXATION 2 LEVEL N/A 08/29/2014   Procedure: TRANSFORAMINAL LUMBAR INTERBODY FUSION (TLIF) L4-S1;  Surgeon: Donaciano Sprang, MD;  Location: [facility] OR;  Service: Orthopedics;  Laterality: N/A;   TUBAL LIGATION  1992    Family History  Problem Relation Age of Onset   Pancreatic cancer Mother    Cancer Mother    Hypertension Mother    Arthritis Mother    Prostate cancer Father    Cancer Father        Prostate   Hypertension Father    Asthma Father    Allergic rhinitis Father    Arthritis Sister    Scoliosis Sister    Sleep apnea Daughter    Narcolepsy Daughter    Asthma Daughter    Colon cancer Neg Hx    Liver cancer Neg Hx    Stomach cancer Neg Hx    Rectal cancer Neg Hx    Esophageal cancer Neg Hx    Social History   Socioeconomic History   Marital status: Single    Spouse name: Not on file   Number of children: 3   Years of education: Not on file   Highest education level: Not on file  Occupational History  Occupation: Disabled   Occupation: CNA  Tobacco Use   Smoking status: Never   Smokeless tobacco: Never  Vaping Use   Vaping status: Never Used  Substance and Sexual Activity   Alcohol use: No   Drug use: No   Sexual activity: Yes    Birth control/protection: Surgical  Other Topics Concern   Not on file  Social History Narrative   Current Social History 01/03/2019        Patient lives with daughter and 2 yo grandson in a second floor apartment. There are 14 steps with handrails up to the entrance the patient uses.       Patient's method of transportation is personal car.      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      The patient currently disabled.      Identified important Relationships are My brother's girlfriend, they've been together 20 years.       Pets : None       Interests / Fun: [place], reading      Current Stressors: Nothing really. I try not to let things stress me.       Religious / Personal Beliefs: Christian       L. Jori, RN, BSN       Social Drivers of Health   Financial Resource Strain: Medium Risk (12/05/2023)   Overall Financial Resource Strain (CARDIA)    Difficulty of  Paying Living Expenses: Somewhat hard  Food Insecurity: Food Insecurity Present (12/05/2023)   Hunger Vital Sign    Worried About Running Out of Food in the Last Year: Often true    Ran Out of Food in the Last Year: Sometimes true  Transportation Needs: No Transportation Needs (12/05/2023)   PRAPARE - Administrator, Civil Service (Medical): No    Lack of Transportation (Non-Medical): No  Physical Activity: Insufficiently Active (12/05/2023)   Exercise Vital Sign    Days of Exercise per Week: 2 days    Minutes of Exercise per Session: 20 min  Stress: No Stress Concern Present (12/05/2023)   [employer] of Occupational Health - Occupational Stress Questionnaire    Feeling of Stress: Only a little  Social Connections: Moderately Integrated (12/05/2023)   Social Connection and Isolation Panel    Frequency of Communication with Friends and Family: More than three times a week    Frequency of Social Gatherings with Friends and Family: More than three times a week    Attends Religious Services: More than 4 times per year    Active Member of [employer] or Organizations: Yes    Attends Engineer, structural: More than 4 times per year    Marital Status: Divorced    Tobacco Counseling Counseling given: Not Answered    Clinical Intake:  Pre-visit preparation completed: Yes  Pain : No/denies pain Pain Score: 0-No pain     BMI - recorded: 42.07 Nutritional Status: BMI > 30  Obese Nutritional Risks: None Diabetes: No  Lab Results  Component Value Date   HGBA1C 5.9 (A) 02/11/2022   HGBA1C 5.9 (A) 12/08/2020   HGBA1C 5.9 (H) 12/13/2019     How often do you need to have someone help you when you read instructions, pamphlets, or other written materials from your doctor or pharmacy?: 1 - Never  Interpreter Needed?: No  Information entered by :: Rolla Kedzierski N. Serrena Linderman, LPN.   Activities of Daily Living  12/05/2023    3:51 PM 01/20/2023    9:09 AM  In your  present state of health, do you have any difficulty performing the following activities:  Hearing? 1 0  Vision? 0 0  Difficulty concentrating or making decisions? 0 0  Comment ADZ:HJFZD, READING, PUZZLES   Walking or climbing stairs? 0 1  Dressing or bathing? 0 0  Doing errands, shopping? 0 0  Preparing Food and eating ? N   Using the Toilet? N   In the past six months, have you accidently leaked urine? N   Do you have problems with loss of bowel control? N   Managing your Medications? N   Managing your Finances? N   Housekeeping or managing your Housekeeping? N     Patient Care Team: Elnora Ip, MD as PCP - General (Student) Germaine Ozell FALCON, MD as Consulting Physician (Orthopedic Surgery) Pyrtle, Gordy HERO, MD as Consulting Physician (Gastroenterology) Starla Harland BROCKS, MD as Consulting Physician (Obstetrics and Gynecology) Russell Vina BRAVO, MD as Consulting Physician (Ophthalmology)  I have updated your Care Teams any recent Medical Services you may have received from other providers in the past year.     Assessment:   This is a routine wellness examination for [place].  Hearing/Vision screen Hearing Screening - Comments:: Left ear hearing difficulties, no hearing aids. Vision Screening - Comments:: Wears rx glasses - up to date with routine eye exams with Vina Russell, MD.    Goals Addressed             This Visit's Progress    12/05/23: To lose weight, maintain my health and stay independent.         Depression Screen     12/05/2023    3:53 PM 11/16/2023    8:32 AM 08/31/2023   10:49 AM 01/20/2023    9:09 AM 09/08/2022    2:58 PM 09/02/2022    4:16 PM 09/02/2022    2:51 PM  PHQ 2/9 Scores  PHQ - 2 Score 1 0 1 0 0 0 0  PHQ- 9 Score 3 0    0 0  Exception Documentation   Patient refusal        Fall Risk     12/05/2023    3:50 PM 11/16/2023    8:32 AM 08/31/2023   10:49 AM 01/20/2023    9:09 AM 09/08/2022    2:56 PM  Fall Risk   Falls in the past year?  0 0 0 0 0  Number falls in past yr: 0 0 0 0 0  Injury with Fall? 0 0 0 0 0  Risk for fall due to : No Fall Risks No Fall Risks No Fall Risks No Fall Risks   Follow up Falls evaluation completed Falls evaluation completed;Falls prevention discussed Falls evaluation completed Falls evaluation completed;Falls prevention discussed Falls evaluation completed    MEDICARE RISK AT HOME:  Medicare Risk at Home Any stairs in or around the home?: No If so, are there any without handrails?: No Home free of loose throw rugs in walkways, pet beds, electrical cords, etc?: Yes Adequate lighting in your home to reduce risk of falls?: Yes Life alert?: No Use of a cane, walker or w/c?: No Grab bars in the bathroom?: Yes Shower chair or bench in shower?: No Elevated toilet seat or a handicapped toilet?: Yes  TIMED UP AND GO:  Was the test performed?  No  Cognitive Function: Declined/Normal: No cognitive concerns noted by patient or family. Patient  alert, oriented, able to answer questions appropriately and recall recent events. No signs of memory loss or confusion.    12/05/2023    3:53 PM  MMSE - Mini Mental State Exam  Not completed: Unable to complete        12/05/2023    3:55 PM  6CIT Screen  What Year? 0 points  What month? 0 points  What time? 0 points  Count back from 20 0 points  Months in reverse 0 points  Repeat phrase 0 points  Total Score 0 points    Immunizations Immunization History  Administered Date(s) Administered   PFIZER(Purple Top)SARS-COV-2 Vaccination 01/01/2020, 01/22/2020   PNEUMOCOCCAL CONJUGATE-20 11/16/2023   Td 10/21/2008   Tdap 05/12/2008    Screening Tests Health Maintenance  Topic Date Due   Hepatitis B Vaccines 19-59 Average Risk (1 of 3 - 19+ 3-dose series) Never done   Zoster Vaccines- Shingrix (1 of 2) Never done   COVID-19 Vaccine (3 - 2024-25 season) 12/12/2022   MAMMOGRAM  11/17/2023   INFLUENZA VACCINE  11/11/2023   DTaP/Tdap/Td (3 - Td or  Tdap) 11/15/2024 (Originally 10/22/2018)   Cervical Cancer Screening (HPV/Pap Cotest)  08/25/2024   Medicare Annual Wellness (AWV)  12/04/2024   Colonoscopy  05/15/2033   Pneumococcal Vaccine: 50+ Years  Completed   Hepatitis C Screening  Completed   HIV Screening  Completed   HPV VACCINES  Aged Out   Meningococcal B Vaccine  Aged Out    Health Maintenance  Health Maintenance Due  Topic Date Due   Hepatitis B Vaccines 19-59 Average Risk (1 of 3 - 19+ 3-dose series) Never done   Zoster Vaccines- Shingrix (1 of 2) Never done   COVID-19 Vaccine (3 - 2024-25 season) 12/12/2022   MAMMOGRAM  11/17/2023   INFLUENZA VACCINE  11/11/2023   Health Maintenance Items Addressed:Yes Patient aware of current care gaps.  Additional Screening:  Vision Screening: Recommended annual ophthalmology exams for early detection of glaucoma and other disorders of the eye. Would you like a referral to an eye doctor? No    Dental Screening: Recommended annual dental exams for proper oral hygiene  Community Resource Referral / Chronic Care Management: CRR required this visit?  No   CCM required this visit?  No   Plan:    I have personally reviewed and noted the following in the patient's chart:   Medical and social history Use of alcohol, tobacco or illicit drugs  Current medications and supplements including opioid prescriptions. Patient is not currently taking opioid prescriptions. Functional ability and status Nutritional status Physical activity Advanced directives List of other physicians Hospitalizations, surgeries, and ER visits in previous 12 months Vitals Screenings to include cognitive, depression, and falls Referrals and appointments  In addition, I have reviewed and discussed with patient certain preventive protocols, quality metrics, and best practice recommendations. A written personalized care plan for preventive services as well as general preventive health recommendations  were provided to patient.   Roz LOISE Fuller, LPN   [id number]   After Visit Summary: (MyChart) Due to this being a telephonic visit, the after visit summary with patients personalized plan was offered to patient via MyChart   Notes: Nothing significant to report at this time.

## 2023-12-05 NOTE — Patient Instructions (Signed)
 [name] , Thank you for taking time out of your busy schedule to complete your Annual Wellness Visit with me. I enjoyed our conversation and look forward to speaking with you again next year. I, as well as your care team,  appreciate your ongoing commitment to your health goals. Please review the following plan we discussed and let me know if I can assist you in the future. Your Game plan/ To Do List    Referrals: If you haven't heard from the office you've been referred to, please reach out to them at the phone provided.   Follow up Visits: We will see or speak with you next year for your Next Medicare AWV with our clinical staff Have you seen your provider in the last 6 months (3 months if uncontrolled diabetes)? Yes  Clinician Recommendations:  Aim for 30 minutes of exercise or brisk walking, 6-8 glasses of water , and 5 servings of fruits and vegetables each day.       This is a list of the screenings recommended for you:  Health Maintenance  Topic Date Due   Hepatitis B Vaccine (1 of 3 - 19+ 3-dose series) Never done   Zoster (Shingles) Vaccine (1 of 2) Never done   COVID-19 Vaccine (3 - 2024-25 season) 12/12/2022   Mammogram  11/17/2023   Flu Shot  11/11/2023   DTaP/Tdap/Td vaccine (3 - Td or Tdap) 11/15/2024*   Pap with HPV screening  08/25/2024   Medicare Annual Wellness Visit  12/04/2024   Colon Cancer Screening  05/15/2033   Pneumococcal Vaccine for age over 13  Completed   Hepatitis C Screening  Completed   HIV Screening  Completed   HPV Vaccine  Aged Out   Meningitis B Vaccine  Aged Out  *Topic was postponed. The date shown is not the original due date.    Advanced directives: (Declined) Advance directive discussed with you today. Even though you declined this today, please call our office should you change your mind, and we can give you the proper paperwork for you to fill out. Advance Care Planning is important because it:  [x]  Makes sure you receive the medical  care that is consistent with your values, goals, and preferences  [x]  It provides guidance to your family and loved ones and reduces their decisional burden about whether or not they are making the right decisions based on your wishes.  Follow the link provided in your after visit summary or read over the paperwork we have mailed to you to help you started getting your Advance Directives in place. If you need assistance in completing these, please reach out to us  so that we can help you!  See attachments for Preventive Care and Fall Prevention Tips.

## 2023-12-26 NOTE — Progress Notes (Signed)
 Internal Medicine Attending:  I reviewed the AWV findings of the medical professional who conducted the visit. I was present in the office suite and immediately available to provide assistance and direction throughout the time the service was provided.

## 2024-01-05 ENCOUNTER — Ambulatory Visit: Payer: Self-pay

## 2024-01-05 NOTE — Telephone Encounter (Signed)
 FYI Only or Action Required?: Action required by provider: request for appointment.  Patient was last seen in primary care on 11/16/2023 by Marylu Gee, DO.  Called Nurse Triage reporting Sore Throat and Foul Smelling Urine.  Symptoms began several weeks ago.  Interventions attempted: Rest, hydration, or home remedies.  Symptoms are: unchanged.  Triage Disposition: See Physician Within 24 Hours  Patient/caregiver understands and will follow disposition?: Yes  Reason for Disposition  Bad or foul-smelling urine  [1] Sore throat is the only symptom AND [2] present > 48 hours  Answer Assessment - Initial Assessment Questions 1. SYMPTOM: What's the main symptom you're concerned about? (e.g., frequency, incontinence)         Urine orange, pt reports urine normally orange when she hasn't drank enough fluids. Hasn't drank much bc she's been sleeping a lot. Has a mild odor that is concerning and different from pts baseline. 2. ONSET: When did the urine odor and orange color  start?     3 weeks ago 3. PAIN: Is there any pain? If Yes, ask: How bad is it? (Scale: 1-10; mild, moderate, severe)     No pain 4. CAUSE: What do you think is causing the symptoms?     Unsure 5. OTHER SYMPTOMS: Do you have any other symptoms? (e.g., blood in urine, fever, flank pain, pain with urination)     Increased lethargy in past 3 weeks  Answer Assessment - Initial Assessment Questions 1. ONSET: When did the throat start hurting? (Hours or days ago)      3 weeks ago 2. SEVERITY: How bad is the sore throat? (Scale 1-10; mild, moderate or severe)     4/10 3. STREP EXPOSURE: Has there been any exposure to strep within the past week? If Yes, ask: What type of contact occurred?      No 4.  VIRAL SYMPTOMS: Are there any symptoms of a cold, such as a runny nose, cough, hoarse voice or red eyes?      Intermittent hoarsness 5. FEVER: Do you have a fever? If Yes, ask: What is your  temperature, how was it measured, and when did it start?     No fever 6. PUS ON THE TONSILS: Is there pus on the tonsils in the back of your throat?     No 7. OTHER SYMPTOMS: Do you have any other symptoms? (e.g., difficulty breathing, headache, rash)     Headache when lying down in past 3 weeks. 8/10 pain, improves when sitting upright. No headache now.  Protocols used: Urinary Symptoms-A-AH, Sore Throat-A-AH

## 2024-01-05 NOTE — Telephone Encounter (Signed)
 RTC to patient thinks she has a UTI.  Frequency.  Given appointment for 01/06/2024 at 945 AM.

## 2024-01-06 ENCOUNTER — Other Ambulatory Visit: Payer: Self-pay

## 2024-01-06 ENCOUNTER — Ambulatory Visit (INDEPENDENT_AMBULATORY_CARE_PROVIDER_SITE_OTHER): Admitting: Student

## 2024-01-06 VITALS — BP 138/89 | HR 69 | Temp 98.0°F | Ht 62.0 in | Wt 226.2 lb

## 2024-01-06 DIAGNOSIS — Z87440 Personal history of urinary (tract) infections: Secondary | ICD-10-CM | POA: Diagnosis not present

## 2024-01-06 DIAGNOSIS — R8290 Unspecified abnormal findings in urine: Secondary | ICD-10-CM

## 2024-01-06 DIAGNOSIS — R319 Hematuria, unspecified: Secondary | ICD-10-CM | POA: Diagnosis not present

## 2024-01-06 DIAGNOSIS — R6889 Other general symptoms and signs: Secondary | ICD-10-CM | POA: Diagnosis not present

## 2024-01-06 LAB — POCT URINALYSIS DIPSTICK
Bilirubin, UA: NEGATIVE
Glucose, UA: NEGATIVE
Ketones, UA: NEGATIVE
Leukocytes, UA: NEGATIVE
Nitrite, UA: NEGATIVE
Protein, UA: NEGATIVE
Spec Grav, UA: >=1.03 — AB (ref 1.010–1.025)
Urobilinogen, UA: 0.2 U/dL
pH, UA: 5.5 (ref 5.0–8.0)

## 2024-01-06 NOTE — Progress Notes (Signed)
   CC: Acute Concern of foul-smelling urine  HPI:  Brandi Chase is a 58 y.o. female with pertinent PMH of HTN, asthma, GERD, and stress urinary incontinence who presents as above. Please see assessment and plan below for further details.  Medications: Current Outpatient Medications  Medication Instructions   budesonide -formoterol  (SYMBICORT ) 160-4.5 MCG/ACT inhaler 2 puffs, Inhalation, 2 times daily   Ferrous Sulfate  (IRON PO)    magnesium  oxide (MAG-OX) 400 mg, Daily   miconazole  (MICOTIN) 200 mg, Vaginal, Daily at bedtime   ondansetron  (ZOFRAN -ODT) 4 mg, Oral, Every 8 hours PRN   pantoprazole  (PROTONIX ) 40 mg, Oral, Daily   POTASSIUM PO    pramipexole  (MIRAPEX ) 0.125 mg, Oral, Daily at bedtime   rosuvastatin  (CRESTOR ) 5 mg, Oral, Daily   selenium  sulfide (SELSUN ) 2.5 % lotion 1 Application, Topical, Daily PRN     Review of Systems:   Pertinent items noted in HPI and/or A&P.  Physical Exam:  Vitals:   01/06/24 1008 01/06/24 1014  BP: (!) 143/88 138/89  Pulse: 70 69  Temp: 98 F (36.7 C)   TempSrc: Oral   SpO2: 96%   Weight: 226 lb 3.2 oz (102.6 kg)   Height: 5' 2 (1.575 m)     Constitutional: Well-appearing adult female. In no acute distress. HEENT: Normocephalic, atraumatic, Sclera non-icteric, PERRL, EOM intact.  Pharynx with mild erythema but otherwise normal oropharynx on exam Cardio:Regular rate and rhythm.  Pulm: Normal work of breathing on room air. FDX:Wzhjupcz for extremity edema. Skin:Warm and dry. Neuro:Alert and oriented x3. No focal deficit noted. Psych:Pleasant mood and affect.   Assessment & Plan:   Assessment & Plan Foul smelling urine Patient presents with 3 weeks of foul-smelling urine that has been recently orange in color but otherwise denies any signs or symptoms of UTI or gross hematuria.  She does note that her UTIs in the past have presented usually as foul-smelling urine.  Last documented culture positive UTI was from 2021 and was  Citrobacter.  Urine dip in the office today is weakly positive for blood but otherwise normal.  She has had weakly positive dip bloody urine in the past so we will send this for formal urinalysis but recommended the patient hydrates and alert us  of any new or worsening symptoms. Hematuria, unspecified type As above weakly positive urine on dip.  Sent for formal urinalysis. Throat symptom Patient also notes 3 weeks of redness in her throat without any associated symptoms including sore throat, dry throat, pain with swallowing, difficulty swallowing, fevers, cough, congestion, voice change, neck pain, or anything that is worrisome.  On exam there is mild erythema of the throat but otherwise no significant abnormalities.  Discussed this could be due to allergies as she does not take any antihistamines at home. - Start Flonase  and nondrowsy antihistamine daily  Orders Placed This Encounter  Procedures   Urinalysis, Reflex Microscopic   POCT Urinalysis Dipstick (18997)     Patient discussed with Dr. Layman Bee  Fairy Pool, DO [facility] Internal Medicine Resident PGY-3 Clinic Phone: [phone] Please contact the on call pager at [phone] for any urgent or emergent needs.

## 2024-01-06 NOTE — Patient Instructions (Signed)
 Thank you, [name], for allowing us  to provide your care today. Today we discussed . . .  > Foul-smelling urine       - There is not appear to be signs of an infection in your urine but there is a small amount of blood.  This has been there in the past.  I would like to send your urine off so someone can look at it under the microscope because sometimes this blood on the dip urine is not actual blood cells.  In the meantime I would like you to continue to work on your hydration and let us  know if you develop any new or persistent symptoms especially fevers, lower abdominal pain, or red/bloody urine. > Throat redness       - For your throat redness I do not think that you have an infection like viral pharyngitis, strep throat, or anything that we need to test for at this point but I think it is possible that some of this may have to do with allergies and I would like to try a couple allergy medications to see if that makes it better.  I would like you to start taking Flonase  nasal spray daily and take an oral nondrowsy antihistamine such as Claritin, Zyrtec , or Allegra.  If you start to have any symptoms such as fever, throat pain, trouble swallowing, or other worrisome symptoms please let us  know.   I have ordered the following labs for you:   Lab Orders         POCT Urinalysis Dipstick (18997)       Follow up: 2-3 months    Remember:  Should you have any questions or concerns please call the internal medicine clinic at [phone].     Fairy Pool, DO [facility]

## 2024-01-07 NOTE — Progress Notes (Signed)
 Internal Medicine Clinic Attending  Case discussed with the resident at the time of the visit.  We reviewed the resident's history and exam and pertinent patient test results.  I agree with the assessment, diagnosis, and plan of care documented in the resident's note.

## 2024-01-10 ENCOUNTER — Other Ambulatory Visit: Payer: Self-pay | Admitting: Internal Medicine

## 2024-01-10 ENCOUNTER — Telehealth: Payer: Self-pay | Admitting: *Deleted

## 2024-01-10 DIAGNOSIS — R8290 Unspecified abnormal findings in urine: Secondary | ICD-10-CM

## 2024-01-10 LAB — URINALYSIS, ROUTINE W REFLEX MICROSCOPIC

## 2024-01-10 NOTE — Progress Notes (Signed)
 Patient did not have enough urine in sample to send at last visit. I re-ordered U/A with reflex to culture.

## 2024-01-10 NOTE — Telephone Encounter (Signed)
 RTC from patient.  Currently out of town will come in on Thursday around 11:15 to do a urine specimen only visit.

## 2024-01-10 NOTE — Telephone Encounter (Signed)
 Call to patient to have her come in to give a Urine specimen.  Previous urine given was not enough to do the tests that were ordered.  Message was left on patient's voice mail that additional Urine is needed to complete the test that were ordered by her doctor.

## 2024-01-11 ENCOUNTER — Ambulatory Visit: Payer: Self-pay | Admitting: Student

## 2024-01-12 ENCOUNTER — Other Ambulatory Visit

## 2024-01-12 DIAGNOSIS — R8290 Unspecified abnormal findings in urine: Secondary | ICD-10-CM

## 2024-01-14 LAB — MICROSCOPIC EXAMINATION
Casts: NONE SEEN /LPF
Epithelial Cells (non renal): >10 /HPF — AB (ref 0–10)

## 2024-01-14 LAB — UA/M W/RFLX CULTURE, ROUTINE
Bilirubin, UA: NEGATIVE
Glucose, UA: NEGATIVE
Ketones, UA: NEGATIVE
Leukocytes,UA: NEGATIVE
Nitrite, UA: NEGATIVE
RBC, UA: NEGATIVE
Specific Gravity, UA: 1.024 (ref 1.005–1.030)
Urobilinogen, Ur: 0.2 mg/dL (ref 0.2–1.0)
pH, UA: 5.5 (ref 5.0–7.5)

## 2024-01-14 LAB — URINE CULTURE, REFLEX

## 2024-01-31 ENCOUNTER — Ambulatory Visit: Payer: Self-pay | Admitting: Student

## 2024-01-31 DIAGNOSIS — B360 Pityriasis versicolor: Secondary | ICD-10-CM

## 2024-01-31 NOTE — Progress Notes (Signed)
 Called to discuss results and re-evaluate symptoms. Urinary symptoms have improved and UA/culture consistent with unclean catch but low suspicion for UTI. She continues to have bothersome itching in her chest and upper extremities attributed previously to [facility] and would like a dermatology referral.

## 2024-02-08 ENCOUNTER — Telehealth: Payer: Self-pay | Admitting: *Deleted

## 2024-02-08 DIAGNOSIS — B360 Pityriasis versicolor: Secondary | ICD-10-CM

## 2024-02-08 NOTE — Telephone Encounter (Signed)
 Copied from CRM [id number]. Topic: Referral - Question >> Feb 08, 2024  4:23 PM Chiquita SQUIBB wrote: Reason for CRM: Patient is calling in stating the dermatologist she was referred to is booked out until June, patient is asking for a referral to somewhere else, patient is requesting a doctor with a high score. please advise the patient.

## 2024-02-09 NOTE — Telephone Encounter (Signed)
 Copied from CRM [id number]. Topic: Referral - Question >> Feb 08, 2024  4:23 PM Chiquita SQUIBB wrote: Reason for CRM: Patient is calling in stating the dermatologist she was referred to is booked out until June, patient is asking for a referral to somewhere else, patient is requesting a doctor with a high score. please advise the patient. >> Feb 09, 2024  3:33 PM Miquel SAILOR wrote: Ambulatory referral to Dermatology (Order # [id number]) on 01/31/2024  View Encounter    PT on update for referral . Let Pt know will received call back or My Chart message when that is updated . [phone]

## 2024-02-13 NOTE — Telephone Encounter (Signed)
 Rec'd Message from the patient  below  Copied from CRM #[id number]. Topic: Referral - Question >> Feb 08, 2024  4:23 PM Chiquita SQUIBB wrote: Reason for CRM: Patient is calling in stating the dermatologist she was referred to is booked out until June, patient is asking for a referral to somewhere else, patient is requesting a doctor with a high score. please advise the patient.    This referral has now been closed by the [facility] Dermatologist as the patient refused an appointment because of the turn around time of waiting until June.  Please place a new Referral to be sent to a different external office.

## 2024-02-23 ENCOUNTER — Other Ambulatory Visit

## 2024-03-12 ENCOUNTER — Ambulatory Visit
Admission: RE | Admit: 2024-03-12 | Discharge: 2024-03-12 | Disposition: A | Source: Ambulatory Visit | Attending: Internal Medicine

## 2024-03-12 ENCOUNTER — Ambulatory Visit: Admitting: Student

## 2024-03-12 ENCOUNTER — Ambulatory Visit

## 2024-03-12 DIAGNOSIS — Z1231 Encounter for screening mammogram for malignant neoplasm of breast: Secondary | ICD-10-CM

## 2024-03-21 ENCOUNTER — Ambulatory Visit: Payer: Self-pay

## 2024-03-21 NOTE — Telephone Encounter (Signed)
 Patient/caller partially completed triage.  Reason for refusal: left before completion; attempted to get timeline of symptoms pt stated, I don't feel like talking anymore and hung up.  Copied from CRM #[id number]. Topic: Clinical - Red Word Triage >> Mar 21, 2024  2:25 PM DeAngela L wrote: Red Word that prompted transfer to Nurse Triage: patient is feeling light headed and feels like she is going to faint and asked for a UTI check blood check, has been feeling like this for the last 3 days   Pt num [phone] (M) Reason for Disposition  [1] MODERATE dizziness (e.g., interferes with normal activities) AND [2] has NOT been evaluated by doctor (or NP/PA) for this  (Exception: Dizziness caused by heat exposure, sudden standing, or poor fluid intake.)  Answer Assessment - Initial Assessment Questions Pt called in with new lightheadedness, denies LOC, change in vision or h/a. Attempted to ask triage questions and get information about symptoms. Pt stated, I don't feel like talking anymore and hung up. Appt requested for tomorrow d/t being out of town today. Scheduled 03/22/24.   1. DESCRIPTION: Describe your dizziness.     Comes and goes   2. LIGHTHEADED: Do you feel lightheaded? (e.g., somewhat faint, woozy, weak upon standing)     Woozy  3. VERTIGO: Do you feel like either you or the room is spinning or tilting? (i.e., vertigo)     No   4. SEVERITY: How bad is it?  Do you feel like you are going to faint? Can you stand and walk?     Woozy; light headed  Protocols used: Dizziness - Lightheadedness-A-AH

## 2024-03-22 ENCOUNTER — Ambulatory Visit

## 2024-03-22 VITALS — BP 130/84 | HR 79 | Temp 98.4°F | Ht 62.0 in | Wt 229.8 lb

## 2024-03-22 DIAGNOSIS — R42 Dizziness and giddiness: Secondary | ICD-10-CM | POA: Diagnosis not present

## 2024-03-22 DIAGNOSIS — Z79899 Other long term (current) drug therapy: Secondary | ICD-10-CM

## 2024-03-22 DIAGNOSIS — R3914 Feeling of incomplete bladder emptying: Secondary | ICD-10-CM | POA: Diagnosis not present

## 2024-03-22 DIAGNOSIS — R1032 Left lower quadrant pain: Secondary | ICD-10-CM

## 2024-03-22 DIAGNOSIS — Z8742 Personal history of other diseases of the female genital tract: Secondary | ICD-10-CM | POA: Diagnosis not present

## 2024-03-22 DIAGNOSIS — R3915 Urgency of urination: Secondary | ICD-10-CM

## 2024-03-22 NOTE — Patient Instructions (Addendum)
 Follow instructions as discussed in today's plan: - Stop taking Mirapex  for 2 weeks.  If your bladder symptoms do not resolve in 2 weeks, then give us  a callback and we will put in a referral for urology for further evaluation. - If your bladder symptoms do get better after you stop Mirapex , you can make another appointment to come see us  and we can talk about other medications for your restless leg syndrome - Please drink adequate amount of fluids to help with your dizziness. - You can take Zofran  as needed for nausea  Thank you  Rebecka Pion, DO

## 2024-03-22 NOTE — Progress Notes (Signed)
 CC: Acute Visit   HPI:  [name] is a 58 y.o. female living with a history stated below and presents today for Acute Visit. Please see problem based assessment and plan for additional details. Past Medical History:  Diagnosis Date   Adnexal cyst 2011   removed   Anemia    Arthritis    neck; spine (08/29/2014)   Asthma    Carpal tunnel syndrome of left wrist    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, left 03/15/2019   Chronic lower back pain    Complication of anesthesia    difficult to awake   COVID 08/13/2020   Daily headache 12/2013   tx'd and no problems anymore (08/29/2014)   Dyspnea    with asthma attack- none recent   Family history of adverse reaction to anesthesia    1 sister and both parents hard to wake up   H/O pelvic mass 05/24/2013   Heart murmur    dx'd when I was a teenager; haven't had any problems w/it   Hip pain, acute 07/22/2021   Hip pain, right    History of bronchitis    Hypertension    Iron deficiency anemia 03/29/2018   Pruritus 06/08/2021   Ruptured cyst of ovary 10/26/2019   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   On RIght,  Removed with salpingectomy 2011     Stye 09/02/2022   Suspected UTI 09/08/2022   Urinary incontinence    Urinary urgency    UTI (lower urinary tract infection)     Medications Ordered Prior to Encounter[1]  Family History  Problem Relation Age of Onset   Pancreatic cancer Mother    Cancer Mother    Hypertension Mother    Arthritis Mother    Prostate cancer Father    Cancer Father        Prostate   Hypertension Father    Asthma Father    Allergic rhinitis Father    Arthritis Sister    Scoliosis Sister    Sleep apnea Daughter    Narcolepsy Daughter    Asthma Daughter    Colon cancer Neg Hx    Liver cancer Neg Hx    Stomach cancer Neg Hx    Rectal cancer Neg Hx    Esophageal cancer Neg Hx     Social History   Socioeconomic History   Marital status: Single    Spouse name: Not on file   Number of children:  3   Years of education: Not on file   Highest education level: Not on file  Occupational History   Occupation: Disabled   Occupation: CNA  Tobacco Use   Smoking status: Never   Smokeless tobacco: Never  Vaping Use   Vaping status: Never Used  Substance and Sexual Activity   Alcohol use: No   Drug use: No   Sexual activity: Yes    Birth control/protection: Surgical  Other Topics Concern   Not on file  Social History Narrative   Current Social History 01/03/2019        Patient lives with daughter and 2 yo grandson in a second floor apartment. There are 14 steps with handrails up to the entrance the patient uses.       Patient's method of transportation is personal car.      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      The patient currently disabled.      Identified important Relationships are My brother's girlfriend, they've been  together 20 years.       Pets : None       Interests / Fun: [place], reading      Current Stressors: Nothing really. I try not to let things stress me.       Religious / Personal Beliefs: Christian       L. Jori, RN, BSN       Social Drivers of Health   Tobacco Use: Low Risk (02/12/2024)   Received from [facility]   Patient History    Smoking Tobacco Use: Never    Smokeless Tobacco Use: Never    Passive Exposure: Not on file  Financial Resource Strain: Medium Risk (12/05/2023)   Overall Financial Resource Strain (CARDIA)    Difficulty of Paying Living Expenses: Somewhat hard  Food Insecurity: Food Insecurity Present (12/05/2023)   Epic    Worried About Programme Researcher, Broadcasting/film/video in the Last Year: Often true    Ran Out of Food in the Last Year: Sometimes true  Transportation Needs: No Transportation Needs (12/05/2023)   Epic    Lack of Transportation (Medical): No    Lack of Transportation (Non-Medical): No  Physical Activity: Insufficiently Active (12/05/2023)   Exercise Vital Sign     Days of Exercise per Week: 2 days    Minutes of Exercise per Session: 20 min  Stress: No Stress Concern Present (12/05/2023)   [employer] of Occupational Health - Occupational Stress Questionnaire    Feeling of Stress: Only a little  Social Connections: Moderately Integrated (12/05/2023)   Social Connection and Isolation Panel    Frequency of Communication with Friends and Family: More than three times a week    Frequency of Social Gatherings with Friends and Family: More than three times a week    Attends Religious Services: More than 4 times per year    Active Member of Clubs or Organizations: Yes    Attends Banker Meetings: More than 4 times per year    Marital Status: Divorced  Intimate Partner Violence: Not At Risk (12/05/2023)   Epic    Fear of Current or Ex-Partner: No    Emotionally Abused: No    Physically Abused: No    Sexually Abused: No  Depression (PHQ2-9): Low Risk (01/06/2024)   Depression (PHQ2-9)    PHQ-2 Score: 0  Alcohol Screen: Low Risk (12/05/2023)   Alcohol Screen    Last Alcohol Screening Score (AUDIT): 0  Housing: Low Risk (12/05/2023)   Epic    Unable to Pay for Housing in the Last Year: No    Number of Times Moved in the Last Year: 0    Homeless in the Last Year: No  Utilities: At Risk (12/05/2023)   Epic    Threatened with loss of utilities: Yes  Health Literacy: Adequate Health Literacy (12/05/2023)   B1300 Health Literacy    Frequency of need for help with medical instructions: Never    Review of Systems: ROS  Per HPI, assessment, plan  There were no vitals filed for this visit.  Physical Exam: Physical Exam HENT:     Head: Normocephalic.  Eyes:     Extraocular Movements: Extraocular movements intact.  Cardiovascular:     Rate and Rhythm: Normal rate.     Heart sounds: Normal heart sounds.     Comments: +2 RP BL Pulmonary:     Effort: Pulmonary effort is normal.     Breath sounds: Normal breath sounds.  Abdominal:  Tenderness: There is no right CVA tenderness, left CVA tenderness, guarding or rebound.  Neurological:     General: No focal deficit present.     Mental Status: She is alert.     Cranial Nerves: No cranial nerve deficit.     Sensory: No sensory deficit.     Motor: No weakness.     Gait: Gait normal.      Assessment & Plan:     Patient seen with Dr. Jeanelle  Assessment & Plan Dizziness Patient reports developing dizziness 2 to 3 days ago.  She did not LOC or had a fall.  Her dizziness is not positional but random.  She also endorses nausea but reports that it is chronic.  She denies any fevers.  Had a mild headache this a.m. that resolved.  Reports she hydrates herself well.  Denies any room spinning sensation or ringing in ears.  Reports she had to take inhaler for asthma today but otherwise no SOB.  Denies any chest pain. No neurological deficits on physical exam.  Although orthostatics were not positive, there was a drop off 17 units in systolics BP readings from lying down to sitting.  Her symptoms could be due to orthostatics.  Instructed patient to hydrate herself well and follow-up with us  if symptoms do not resolve.  -- Patient instructed on getting adequate hydration --Zofran  prn for nausea Urinary urgency Patient has recently noticed that she urgently needs to urinate and has to rush to the bathroom.  When she tries to urinate she is not able to void completely and reports that she is only able to void a little bit.  She does have mild pain in the left lower quadrant which is chronic in nature due to history of cysts in the left ovary.  Patient denies any hematuria or bilateral flank pain.  She denies any fevers.  UTI, pyelonephritis, ureterolithiasis low on differential per history and physical exam findings.  On inquiring about patient's Mirapex  use for restless leg syndrome, she reports having increased her Mirapex  use sometimes in the past few months. Her symptoms are  likely due to urge incontinence or overflow incontinence developed from overuse of mirapex . Instructed pt to stop taking mirapex  for 2 weeks. If symptoms don't resolve, urology should be referred for further evaluation. If symptoms do resolve, then f/u to discuss alternate medication options for RLS as mirapex  should be d/c indefinitely. Pt stated understanding.   -Stop Mirapex  for 2 weeks -If symptoms resolve in two weeks: d/c mirapex  indefinitely and  f/u for alternative RLS medications options -If symptoms don't resolve in 2 weeks: place urology referral for further evaluation    No orders of the defined types were placed in this encounter.    Rebecka Pion, D.O. [facility], PGY-1 Date 03/22/2024 Time 1:32 PM     [1]  Current Outpatient Medications on File Prior to Visit  Medication Sig Dispense Refill   budesonide -formoterol  (SYMBICORT ) 160-4.5 MCG/ACT inhaler Inhale 2 puffs into the lungs in the morning and at bedtime. 1 each 12   Ferrous Sulfate  (IRON PO)      magnesium  oxide (MAG-OX) 400 MG tablet Take 400 mg by mouth daily.     miconazole  (MICOTIN) 200 MG vaginal suppository Place 1 suppository (200 mg total) vaginally at bedtime. 3 suppository 0   ondansetron  (ZOFRAN -ODT) 4 MG disintegrating tablet Take 1 tablet (4 mg total) by mouth every 8 (eight) hours as needed for nausea or vomiting. 20 tablet 0  pantoprazole  (PROTONIX ) 40 MG tablet Take 1 tablet (40 mg total) by mouth daily. 30 tablet 3   POTASSIUM PO      pramipexole  (MIRAPEX ) 0.125 MG tablet Take 1 tablet (0.125 mg total) by mouth at bedtime. 30 tablet 2   rosuvastatin  (CRESTOR ) 5 MG tablet Take 1 tablet (5 mg total) by mouth daily. 30 tablet 3   selenium  sulfide (SELSUN ) 2.5 % lotion Apply 1 Application topically daily as needed for irritation. 118 mL 12   No current facility-administered medications on file prior to visit.

## 2024-03-27 NOTE — Progress Notes (Signed)
 Internal Medicine Clinic Attending  I was physically present during the key portions of the resident provided service and participated in the medical decision making of patient's management care. I reviewed pertinent patient test results.  The assessment, diagnosis, and plan were formulated together and I agree with the documentation in the resident's note.  Jeanelle Layman CROME, MD

## 2024-03-29 ENCOUNTER — Other Ambulatory Visit: Payer: Self-pay | Admitting: *Deleted

## 2024-03-29 MED ORDER — ONDANSETRON 4 MG PO TBDP: 4.0000 mg | ORAL_TABLET | Freq: Three times a day (TID) | ORAL | 0 refills | Status: AC | PRN

## 2024-03-29 NOTE — Telephone Encounter (Signed)
 Pt saw Dr Edgardo on 12/11. Zofran  is on pt's med list; last written 11/22/23.  Sending to PCP for refill if appropriate. Thanks

## 2024-03-29 NOTE — Telephone Encounter (Signed)
 Copied from CRM #[id number]. Topic: Clinical - Prescription Issue >> Mar 29, 2024  9:03 AM Miquel SAILOR wrote: Reason for CRM: PT seen for nausea on 12/11 was instructed by PCP to pick up Medication but no medication was put in. Still feels the same.Needs call back on this [phone]

## 2024-12-12 ENCOUNTER — Ambulatory Visit
